# Patient Record
Sex: Female | Born: 1940
Health system: Southern US, Community
[De-identification: ages and names within clinical notes are randomized; demographics above are authoritative.]

## PROBLEM LIST (undated history)

## (undated) DIAGNOSIS — E7800 Pure hypercholesterolemia, unspecified: Secondary | ICD-10-CM

## (undated) DIAGNOSIS — I639 Cerebral infarction, unspecified: Secondary | ICD-10-CM

## (undated) DIAGNOSIS — W5503XA Scratched by cat, initial encounter: Secondary | ICD-10-CM

## (undated) DIAGNOSIS — I10 Essential (primary) hypertension: Secondary | ICD-10-CM

## (undated) DIAGNOSIS — Z87442 Personal history of urinary calculi: Secondary | ICD-10-CM

## (undated) DIAGNOSIS — Z8739 Personal history of other diseases of the musculoskeletal system and connective tissue: Secondary | ICD-10-CM

## (undated) DIAGNOSIS — K649 Unspecified hemorrhoids: Secondary | ICD-10-CM

## (undated) DIAGNOSIS — S50811A Abrasion of right forearm, initial encounter: Secondary | ICD-10-CM

## (undated) DIAGNOSIS — H9190 Unspecified hearing loss, unspecified ear: Secondary | ICD-10-CM

## (undated) DIAGNOSIS — M549 Dorsalgia, unspecified: Secondary | ICD-10-CM

## (undated) DIAGNOSIS — R0600 Dyspnea, unspecified: Secondary | ICD-10-CM

## (undated) DIAGNOSIS — G43909 Migraine, unspecified, not intractable, without status migrainosus: Secondary | ICD-10-CM

## (undated) DIAGNOSIS — M1990 Unspecified osteoarthritis, unspecified site: Secondary | ICD-10-CM

## (undated) HISTORY — DX: Unspecified hemorrhoids: K64.9

## (undated) HISTORY — PX: ANTERIOR AND POSTERIOR REPAIR: SHX1172

## (undated) HISTORY — PX: TONSILLECTOMY: SUR1361

## (undated) HISTORY — DX: Migraine, unspecified, not intractable, without status migrainosus: G43.909

## (undated) HISTORY — PX: CARDIAC CATHETERIZATION: SHX172

## (undated) HISTORY — PX: BREAST SURGERY: SHX581

## (undated) HISTORY — PX: CHOLECYSTECTOMY: SHX55

## (undated) HISTORY — DX: Scratched by cat, initial encounter: W55.03XA

## (undated) HISTORY — PX: APPENDECTOMY: SHX54

## (undated) HISTORY — DX: Abrasion of right forearm, initial encounter: S50.811A

## (undated) HISTORY — PX: ABDOMINAL HYSTERECTOMY: SHX81

## (undated) HISTORY — PX: BILATERAL SALPINGOOPHORECTOMY: SHX1223

---

## 2000-11-17 ENCOUNTER — Encounter: Payer: Self-pay | Admitting: Family Medicine

## 2000-11-17 ENCOUNTER — Ambulatory Visit (HOSPITAL_COMMUNITY): Admission: RE | Admit: 2000-11-17 | Discharge: 2000-11-17 | Payer: Self-pay | Admitting: Family Medicine

## 2001-06-24 HISTORY — PX: OTHER SURGICAL HISTORY: SHX169

## 2001-10-27 ENCOUNTER — Encounter: Payer: Self-pay | Admitting: Internal Medicine

## 2001-10-27 ENCOUNTER — Ambulatory Visit (HOSPITAL_COMMUNITY): Admission: RE | Admit: 2001-10-27 | Discharge: 2001-10-27 | Payer: Self-pay | Admitting: Family Medicine

## 2002-01-28 ENCOUNTER — Ambulatory Visit (HOSPITAL_COMMUNITY): Admission: RE | Admit: 2002-01-28 | Discharge: 2002-01-28 | Payer: Self-pay | Admitting: Internal Medicine

## 2002-04-15 ENCOUNTER — Encounter: Payer: Self-pay | Admitting: Internal Medicine

## 2002-04-15 ENCOUNTER — Observation Stay (HOSPITAL_COMMUNITY): Admission: RE | Admit: 2002-04-15 | Discharge: 2002-04-16 | Payer: Self-pay | Admitting: Internal Medicine

## 2002-10-13 ENCOUNTER — Encounter: Payer: Self-pay | Admitting: Family Medicine

## 2002-10-13 ENCOUNTER — Ambulatory Visit (HOSPITAL_COMMUNITY): Admission: RE | Admit: 2002-10-13 | Discharge: 2002-10-13 | Payer: Self-pay | Admitting: *Deleted

## 2003-01-24 ENCOUNTER — Encounter (HOSPITAL_COMMUNITY)
Admission: RE | Admit: 2003-01-24 | Discharge: 2003-02-23 | Payer: Self-pay | Admitting: Physical Medicine and Rehabilitation

## 2003-11-25 ENCOUNTER — Ambulatory Visit (HOSPITAL_COMMUNITY): Admission: RE | Admit: 2003-11-25 | Discharge: 2003-11-25 | Payer: Self-pay | Admitting: Family Medicine

## 2004-05-01 ENCOUNTER — Ambulatory Visit: Payer: Self-pay | Admitting: Internal Medicine

## 2005-03-11 ENCOUNTER — Ambulatory Visit: Payer: Self-pay | Admitting: Orthopedic Surgery

## 2005-03-11 ENCOUNTER — Ambulatory Visit (HOSPITAL_COMMUNITY): Admission: RE | Admit: 2005-03-11 | Discharge: 2005-03-11 | Payer: Self-pay | Admitting: Family Medicine

## 2005-06-25 ENCOUNTER — Ambulatory Visit (HOSPITAL_COMMUNITY): Admission: RE | Admit: 2005-06-25 | Discharge: 2005-06-25 | Payer: Self-pay | Admitting: Family Medicine

## 2005-06-28 ENCOUNTER — Ambulatory Visit (HOSPITAL_COMMUNITY): Admission: RE | Admit: 2005-06-28 | Discharge: 2005-06-28 | Payer: Self-pay | Admitting: Family Medicine

## 2005-07-17 ENCOUNTER — Ambulatory Visit (HOSPITAL_COMMUNITY): Admission: RE | Admit: 2005-07-17 | Discharge: 2005-07-17 | Payer: Self-pay | Admitting: Family Medicine

## 2006-11-12 ENCOUNTER — Ambulatory Visit (HOSPITAL_COMMUNITY): Admission: RE | Admit: 2006-11-12 | Discharge: 2006-11-12 | Payer: Self-pay | Admitting: Orthopedic Surgery

## 2007-05-06 ENCOUNTER — Other Ambulatory Visit: Admission: RE | Admit: 2007-05-06 | Discharge: 2007-05-06 | Payer: Self-pay | Admitting: Obstetrics and Gynecology

## 2007-05-13 ENCOUNTER — Ambulatory Visit (HOSPITAL_COMMUNITY): Admission: RE | Admit: 2007-05-13 | Discharge: 2007-05-13 | Payer: Self-pay | Admitting: Obstetrics & Gynecology

## 2007-11-23 ENCOUNTER — Ambulatory Visit (HOSPITAL_COMMUNITY): Admission: RE | Admit: 2007-11-23 | Discharge: 2007-11-23 | Payer: Self-pay | Admitting: Obstetrics and Gynecology

## 2008-05-25 ENCOUNTER — Encounter
Admission: RE | Admit: 2008-05-25 | Discharge: 2008-05-25 | Payer: Self-pay | Admitting: Physical Medicine and Rehabilitation

## 2008-05-29 ENCOUNTER — Encounter: Admission: RE | Admit: 2008-05-29 | Discharge: 2008-05-29 | Payer: Self-pay | Admitting: Family Medicine

## 2008-07-11 ENCOUNTER — Encounter
Admission: RE | Admit: 2008-07-11 | Discharge: 2008-07-11 | Payer: Self-pay | Admitting: Physical Medicine and Rehabilitation

## 2008-08-11 ENCOUNTER — Encounter
Admission: RE | Admit: 2008-08-11 | Discharge: 2008-08-11 | Payer: Self-pay | Admitting: Physical Medicine and Rehabilitation

## 2008-08-11 ENCOUNTER — Encounter: Admission: RE | Admit: 2008-08-11 | Discharge: 2008-08-11 | Payer: Self-pay | Admitting: Radiology

## 2008-10-17 ENCOUNTER — Other Ambulatory Visit: Admission: RE | Admit: 2008-10-17 | Discharge: 2008-10-17 | Payer: Self-pay | Admitting: Obstetrics and Gynecology

## 2008-11-08 ENCOUNTER — Ambulatory Visit (HOSPITAL_COMMUNITY): Admission: RE | Admit: 2008-11-08 | Discharge: 2008-11-08 | Payer: Self-pay | Admitting: Urology

## 2008-11-23 ENCOUNTER — Ambulatory Visit (HOSPITAL_COMMUNITY): Admission: RE | Admit: 2008-11-23 | Discharge: 2008-11-23 | Payer: Self-pay | Admitting: Obstetrics and Gynecology

## 2008-11-30 ENCOUNTER — Ambulatory Visit (HOSPITAL_COMMUNITY): Admission: RE | Admit: 2008-11-30 | Discharge: 2008-11-30 | Payer: Self-pay | Admitting: Urology

## 2009-09-07 ENCOUNTER — Inpatient Hospital Stay (HOSPITAL_COMMUNITY): Admission: RE | Admit: 2009-09-07 | Discharge: 2009-09-08 | Payer: Self-pay | Admitting: Obstetrics and Gynecology

## 2009-09-07 ENCOUNTER — Encounter: Payer: Self-pay | Admitting: Obstetrics and Gynecology

## 2009-10-09 ENCOUNTER — Ambulatory Visit (HOSPITAL_COMMUNITY): Admission: RE | Admit: 2009-10-09 | Discharge: 2009-10-09 | Payer: Self-pay | Admitting: Family Medicine

## 2009-11-29 ENCOUNTER — Ambulatory Visit (HOSPITAL_COMMUNITY): Admission: RE | Admit: 2009-11-29 | Discharge: 2009-11-29 | Payer: Self-pay | Admitting: Obstetrics and Gynecology

## 2010-06-28 ENCOUNTER — Ambulatory Visit (HOSPITAL_COMMUNITY)
Admission: RE | Admit: 2010-06-28 | Discharge: 2010-06-28 | Payer: Self-pay | Source: Home / Self Care | Attending: Physical Medicine and Rehabilitation | Admitting: Physical Medicine and Rehabilitation

## 2010-09-14 LAB — CBC
HCT: 40.1 % (ref 36.0–46.0)
Hemoglobin: 12.8 g/dL (ref 12.0–15.0)
Hemoglobin: 13.8 g/dL (ref 12.0–15.0)
MCHC: 34.3 g/dL (ref 30.0–36.0)
MCV: 97.8 fL (ref 78.0–100.0)
MCV: 98.4 fL (ref 78.0–100.0)
RBC: 3.79 MIL/uL — ABNORMAL LOW (ref 3.87–5.11)
RDW: 13 % (ref 11.5–15.5)
RDW: 13.1 % (ref 11.5–15.5)

## 2010-09-14 LAB — COMPREHENSIVE METABOLIC PANEL
AST: 54 U/L — ABNORMAL HIGH (ref 0–37)
Albumin: 4.1 g/dL (ref 3.5–5.2)
Calcium: 9 mg/dL (ref 8.4–10.5)
Chloride: 102 mEq/L (ref 96–112)
GFR calc non Af Amer: >60 mL/min (ref >60)
Total Protein: 6.9 g/dL (ref 6.0–8.3)

## 2010-09-14 LAB — BASIC METABOLIC PANEL
BUN: 7 mg/dL (ref 6–23)
CO2: 30 mEq/L (ref 19–32)
Calcium: 8.6 mg/dL (ref 8.4–10.5)
Chloride: 101 mEq/L (ref 96–112)
GFR calc Af Amer: >60 mL/min (ref >60)
Potassium: 3.9 mEq/L (ref 3.5–5.1)

## 2010-09-14 LAB — DIFFERENTIAL
Eosinophils Absolute: 0.1 10*3/uL (ref 0.0–0.7)
Eosinophils Relative: 0 % (ref 0–5)
Lymphocytes Relative: 15 % (ref 12–46)
Lymphs Abs: 2 10*3/uL (ref 0.7–4.0)
Monocytes Absolute: 1.4 10*3/uL — ABNORMAL HIGH (ref 0.1–1.0)
Monocytes Relative: 10 % (ref 3–12)

## 2010-10-01 LAB — BASIC METABOLIC PANEL
CO2: 30 mEq/L (ref 19–32)
Calcium: 9.2 mg/dL (ref 8.4–10.5)
Chloride: 102 mEq/L (ref 96–112)
Creatinine, Ser: 0.52 mg/dL (ref 0.4–1.2)
GFR calc Af Amer: >60 mL/min (ref >60)
Glucose, Bld: 85 mg/dL (ref 70–99)

## 2010-10-01 LAB — CBC
MCHC: 35.3 g/dL (ref 30.0–36.0)
MCV: 97 fL (ref 78.0–100.0)
RBC: 3.64 MIL/uL — ABNORMAL LOW (ref 3.87–5.11)
RDW: 13.8 % (ref 11.5–15.5)

## 2010-10-19 ENCOUNTER — Other Ambulatory Visit: Payer: Self-pay | Admitting: Ophthalmology

## 2010-10-19 ENCOUNTER — Encounter (HOSPITAL_COMMUNITY): Payer: Medicare Other

## 2010-10-19 LAB — BASIC METABOLIC PANEL
BUN: 13 mg/dL (ref 6–23)
Chloride: 103 mEq/L (ref 96–112)
Creatinine, Ser: 0.74 mg/dL (ref 0.4–1.2)
GFR calc non Af Amer: >60 mL/min (ref >60)
Glucose, Bld: 108 mg/dL — ABNORMAL HIGH (ref 70–99)
Potassium: 4 mEq/L (ref 3.5–5.1)

## 2010-10-19 LAB — HEMOGLOBIN AND HEMATOCRIT, BLOOD
HCT: 39.6 % (ref 36.0–46.0)
Hemoglobin: 13 g/dL (ref 12.0–15.0)

## 2010-10-25 ENCOUNTER — Ambulatory Visit (HOSPITAL_COMMUNITY)
Admission: RE | Admit: 2010-10-25 | Discharge: 2010-10-25 | Disposition: A | Discharge status: Home / Self Care | Payer: Medicare Other | Source: Ambulatory Visit | Attending: Ophthalmology | Admitting: Ophthalmology

## 2010-10-25 DIAGNOSIS — Z79899 Other long term (current) drug therapy: Secondary | ICD-10-CM | POA: Insufficient documentation

## 2010-10-25 DIAGNOSIS — Z01810 Encounter for preprocedural cardiovascular examination: Secondary | ICD-10-CM | POA: Insufficient documentation

## 2010-10-25 DIAGNOSIS — I10 Essential (primary) hypertension: Secondary | ICD-10-CM | POA: Insufficient documentation

## 2010-10-25 DIAGNOSIS — Z7982 Long term (current) use of aspirin: Secondary | ICD-10-CM | POA: Insufficient documentation

## 2010-10-25 DIAGNOSIS — H2510 Age-related nuclear cataract, unspecified eye: Secondary | ICD-10-CM | POA: Insufficient documentation

## 2010-10-25 DIAGNOSIS — Z01812 Encounter for preprocedural laboratory examination: Secondary | ICD-10-CM | POA: Insufficient documentation

## 2010-11-01 ENCOUNTER — Encounter (HOSPITAL_COMMUNITY): Payer: Medicare Other

## 2010-11-05 ENCOUNTER — Ambulatory Visit (HOSPITAL_COMMUNITY)
Admission: RE | Admit: 2010-11-05 | Discharge: 2010-11-05 | Disposition: A | Discharge status: Home / Self Care | Payer: Medicare Other | Source: Ambulatory Visit | Attending: Ophthalmology | Admitting: Ophthalmology

## 2010-11-05 DIAGNOSIS — I10 Essential (primary) hypertension: Secondary | ICD-10-CM | POA: Insufficient documentation

## 2010-11-05 DIAGNOSIS — Z79899 Other long term (current) drug therapy: Secondary | ICD-10-CM | POA: Insufficient documentation

## 2010-11-05 DIAGNOSIS — Z7982 Long term (current) use of aspirin: Secondary | ICD-10-CM | POA: Insufficient documentation

## 2010-11-05 DIAGNOSIS — H268 Other specified cataract: Secondary | ICD-10-CM | POA: Insufficient documentation

## 2010-11-05 DIAGNOSIS — H2510 Age-related nuclear cataract, unspecified eye: Secondary | ICD-10-CM | POA: Insufficient documentation

## 2010-11-06 NOTE — Op Note (Signed)
Connie Wood, DANISH         ACCOUNT NO.:  [id number]   MEDICAL RECORD NO.:  [id number]          PATIENT TYPE:  AMB   LOCATION:  DAY                           FACILITY:  [facility]   PHYSICIAN:  Dennie Maizes, M.D.   DATE OF BIRTH:  June 01, 1941   DATE OF PROCEDURE:  11/30/2008  DATE OF DISCHARGE:                               OPERATIVE REPORT   PREOPERATIVE DIAGNOSES:  Hematuria, voiding difficulty.   POSTOPERATIVE DIAGNOSES:  Hematuria, voiding difficulty, urethral  stenosis.   OPERATIVE PROCEDURES:  Cystoscopy and urethral dilation.   ANESTHESIA:  General.   SURGEON:  Dennie Maizes, MD   COMPLICATIONS:  None.   SPECIMEN:  None.   DRAINS:  None.   INDICATIONS FOR THE PROCEDURE:  This 70 year old female was referred to  me for intermittent gross hematuria.  Evaluation revealed bilateral  renal calculi without obstruction.  Her urine cytology was negative.  She also had voiding difficulty.  She was taken to the operating room  today for cystoscopy with possible urethral dilation.   DESCRIPTION OF THE PROCEDURE:  General anesthesia was induced and the  patient was placed on the OR table in the dorsal lithotomy position.  The lower abdomen and genitalia were prepped and draped in a sterile  fashion.  Cystoscopy was done with a 17-French scope.  Appearance of  bladder was normal.  The trigone ureteral orifice and bladder mucosa was  unremarkable.  There was no evidence of any carcinoma in situ, foreign  body, or  bladder tumor.  Bladder capacity was found to be adequate.  The  cystoscope was removed.  Urethral caliber to 18-French and dilation  entered 30-French with straight metal sounds.  The patient tolerated the  procedure well.  She was transferred to the PACU in a satisfactory  condition.      Dennie Maizes, M.D.  Electronically Signed     SK/MEDQ  D:  11/30/2008  T:  11/30/2008  Job:  [id number]   cc:   Marylou Mccoy   Angus G. Renard Matter, MD  Fax:  [phone]

## 2010-11-06 NOTE — H&P (Signed)
NAME:  Connie Wood, Connie Wood NO.:  [id number]   MEDICAL RECORD NO.:  [id number]         PATIENT TYPE:  PAMB   LOCATION:  DAY                           FACILITY:  [facility]   PHYSICIAN:  Dennie Maizes, M.D.        DATE OF BIRTH:   DATE OF ADMISSION:  DATE OF DISCHARGE:  LH                              HISTORY & PHYSICAL   CHIEF COMPLAINT:  Intermittent mild hematuria.   HISTORY OF PRESENT ILLNESS:  This 70 year old female was referred to me  by Miss Cyril Mourning from [facility] for  evaluation of  intermittent gross hematuria.  The patient had intermittent mild  hematuria for 2 days about 2 months ago.  She denied having any  abdominal or flank pain rales with the hematuria.  She denied having  dysuria.  Urine culture and sensitivity revealed no growth.  She had  persistent microhematuria.  She was referred to me.   The patient complains of voiding difficulty as well as intermittent low  back pain.  She has urinary frequency x4-5 and nocturia x2.  No past  history of urolithiasis or hematuria.  She had been treated for urinary  tract infection several years ago.   PAST MEDICAL HISTORY:  1. Hypertension.  2. Rheumatic fever.  3. Migraine headaches.  4. Chronic back pain.  5. Degenerative joint disease.  6,  elevated cholesterol.  1. History of common bile duct stone.  2. Status post tonsillectomy.  3. Dilatation and curettage.  4. Tubal ligation.  5. Status post cholecystectomy.  6. Foot surgery.   MEDICATIONS:  1. Baby aspirin 81 mg p.o. daily.  2. Actonel 40 mg p.o. daily.  3. Hydrochlorothiazide 12.5 mg p.o. daily.  4. Effexor XR at 150 mg daily.  5. Clorazepate 7.5 mg p.o. daily.  6. Nexium 40 mg p.o. daily.  7. OxyContin 5 mg p.o. b.i.d.  8. Hydrocodone with APAP 10/500 one p.o. p.r.n. for pain.  9. Lipitor 40 mg  p.o. daily.   ALLERGIES:  None.   FAMILY HISTORY:  Heart disease, kidney stones and liver cancer.   PHYSICAL EXAMINATION:   VITAL SIGNS:  Height 5 feet 4 inches, weight 152  pounds.  HEENT:  Normal.  LUNGS:  Clear to auscultation.  HEART:  Regular rate and rhythm.  No murmurs.  ABDOMEN: No palpable flank mass or CVA tenderness.  Bladder not  palpable.  PELVIC:  Examination not done.   LABORATORY DATA:  Cytology revealed no evidence of malignant of  dysplastic cells.  CT scan of abdomen and pelvis without and with  contrast at [facility] revealed multiple bilateral renal  calculim, two stones in the right kidney  measuring 7 x 3 and 7 x 4 mm  in size, 2.4 mm size stone in the left kidney, no evidence of  obstruction.  Small  small right renal cyst 10 mm size has been noted.  CT of pelvis was unremarkable.   IMPRESSION:  Hematuria, voiding difficulty, bilateral renal calculi.   PLAN:  Cystoscopy and possible urethral dilation under anesthesia in  short-stay center.  I discussed with the patient and  her family  regarding diagnosis, operative details of the treatments, outcome,  possible risks and complications and they are agreed for the procedure  to be done.      Dennie Maizes, M.D.  Electronically Signed     SK/MEDQ  D:  11/29/2008  T:  11/30/2008  Job:  [id number]   cc:   Cyril Mourning, [facility]   Angus G. Renard Matter, MD  Fax: [phone]   [facility]  Fax: [phone]

## 2010-11-09 NOTE — Op Note (Signed)
NAME:  Connie Wood, Connie Wood                   ACCOUNT NO.:  [id number]   MEDICAL RECORD NO.:  [id number]                   PATIENT TYPE:  AMB   LOCATION:  DAY                                  FACILITY:  [facility]   PHYSICIAN:  R. Roetta Sessions, M.D.              DATE OF BIRTH:  1940-10-30   DATE OF PROCEDURE:  DATE OF DISCHARGE:                                 OPERATIVE REPORT   PROCEDURE:  ERCP with sphincterotomy and stone extraction.   INDICATIONS FOR PROCEDURE:  The patient is a 70 year old lady with  intermittent upper abdominal pain for over 1 year duration.  She had been  extensively evaluated.  More recently, she underwent endoscopic ultrasound  at [facility].  Her biliary tree was noted to be dilated, and she  had a 6 mm filling defect in the duct, consistent with a stone.  She now  comes for ERCP with sphincterotomy and stone extraction. This approach has  been discussed with the patient, and the potential risks, benefits, and  alternatives have been reviewed and questions answered.  Please see my  dictated H & P for more information.  We talked about the risks of the  procedure, including bleeding, perforation, and pancreatitis.  ASA class II.   PROCEDURE IN DETAIL:  Under the fluoroscopy unit, the patient was placed in  the semiprone position.  The patient received ampicillin 2 gm IV and  gentamicin 120 mg IV prior to the procedure.   Conscious sedation--Demerol 125 mg IV and Versed 5 mg IV in divided doses.  Glucagon 1.25 mg IV in divided doses to arrest small bowel motility.   INSTRUMENT:  Olympus therapeutic size duodenoscope.   FINDINGS:  Examination of the distal esophagus and stomach revealed a 2 cm  elliptical prepyloric ulcer with bleeding stigmata.  Please see photos.  The  pylorus was patent and easily traversed.  The duodenum was inspected to the  second portion.  The ampulla was hidden under some floppy, redundant folds.  The scope was pulled back  to assure position 55 cm from the incisors.  A  scout film was taken using a sphincterotome.  The ampulla was impacted.  I  had some difficulty approaching the ampulla because redundant folds above  and below were getting in the way.  The head and body segment of the  pancreatic duct was injected.  These portions of the duct appeared normal.  A more tangential approach was taken for common bile duct cannulation.  We  were eventually able to cannulate the duct.  A cholangiogram was obtained.  The biliary tree was diffusely dilated and evidence of prior  cholecystectomy.   The common bile duct measured approximately 8-9 mm.  I was unable to  identify a filling defect on the real time fluoroscopy images.  Dr. Renato Gails was  present as well.  I placed a safety wire deep in the biliary tree and pulled  the sphincterotome  back across the ampullary orifice at the 12 o'clock  position.  I cut a nearly 1 cm sphincterotomy.  As this was being done, some  small pebbles were recovered through the ampullary orifice.  Subsequently,  I removed the sphincterotome, leaving the guide wire in place and drilled a  graduated 8.5 mm balloon deep in the biliary tree and inflated it to 8.5 and  finally 10 cm and performed a balloon occlusion cholangiogram.  This yielded  additional small fragments and a 5-6 mm yellow, cylindrical stone.  Additional injections revealed no further evidence of filling defects, and  there appeared to be good drainage of contrast through the ampullary  orifice, although there was still quite a bit of contrast in the upstream  biliary tree.   The patient tolerated the procedure well.  Delayed films are pending.   IMPRESSION:  1. Normal-appearing ampulla. Previously dilated biliary tree, status post     cholecystectomy.  2. Choledocholithiasis as described above, status post sphincterotomy with     balloon dredging of the bile duct.   Of note, this lady's hemoglobin was 9.2 the day  prior to the procedure.  She  was 11 back in March through [facility].  She also has an MCV of 77.  This lady has had a colonoscopy previously without any significant findings.   She has a prepyloric ulcer.   She told me prior to the procedure that she has been taking aspirin for  headaches.   This is also evidenced by her prolonged bleeding time of 11 minutes earlier  today.   RECOMMENDATIONS:  1. We are going to have this patient stay overnight, and we will check the     delayed films and repeat the LFTs and amylase tomorrow morning.     Hopefully, removal of the common duct stones will relieve this patient of     her abdominal pain.  2. We will have additional discussions with the patient about staying away     from aspirin.                                               Jonathon Bellows, M.D.    RMR/MEDQ  D:  04/15/2002  T:  04/15/2002  Job:  [id number]   cc:   Angus G. Renard Matter, M.D.   Truitt Merle

## 2010-11-09 NOTE — Op Note (Signed)
NAME:  Connie Wood, Connie Wood                   ACCOUNT NO.:  [id number]   MEDICAL RECORD NO.:  [id number]                   PATIENT TYPE:  AMB   LOCATION:  DAY                                  FACILITY:  [facility]   PHYSICIAN:  Gerrit Friends. Rourk, M.D.               DATE OF BIRTH:  1940/08/31   DATE OF PROCEDURE:  01/28/2002  DATE OF DISCHARGE:                                 OPERATIVE REPORT   PROCEDURE:  Screening colonoscopy.   INDICATIONS FOR PROCEDURE:  The patient is a 70 year old lady with chronic  abdominal pain for which she had been evaluated extensively. She has never  had a lower GI tract screen. She now comes for screening colonoscopy. This  approach has been discussed with Ms. Polito. The potential risks,  benefits, and alternatives have been reviewed, questions answered. She is  agreeable. Please see my dictated H&P. If she is low risk for conscious  sedation will use Versed and Demerol.   MONITORING:  O2 saturation, blood pressure, pulse and respirations were  monitored throughout the entire procedure.   CONSCIOUS SEDATION:  Versed 4 mg IV, Demerol 75 mg IV in divided doses.   INSTRUMENT:  Olympus video chip adult.   COLONOSCOPIC FINDINGS:  Digital rectal exam revealed no abnormalities.   ENDOSCOPIC FINDINGS:  The prep was good.   RECTUM:  Examination of the rectal mucosa including retroflexed view of the  anal verge revealed no abnormalities.   COLON:  The colonic mucosa was surveyed from the rectosigmoid junction  through the left transverse right colon to the area of the appendiceal  orifice, ileocecal valve and cecum. The patient was noted to have a few  scattered right sided diverticula. The remainder of the colonic mucosa all  the way to the cecum appeared normal.  The cecum, ileocecal valve and  appendiceal orifice were photographed for the record. From this level, the  scope was slowly withdrawn and all previously mentioned mucosal surfaces  were again  seen and no other abnormalities were observed. The patient  tolerated the procedure well and was reacted in endoscopy.   IMPRESSION:  1. Normal rectum.  2. Scattered right sided diverticula. The remainder of the colonic mucosa     appeared normal.  3. No endoscopic explanation to explain the patient's chronic abdominal     pain. We mutually agreed upon pursuing a second opinion at [facility].    RECOMMENDATIONS:  Diverticulosis literature provided to Ms. Turney.  Will make plans for her to be seen in consultation in a GI Clinic at [facility] in the near future.                                                Gerrit Friends. Rourk, M.D.  RMR/MEDQ  D:  01/28/2002  T:  02/01/2002  Job:  [id number]   cc:   Angus G. Renard Matter, M.D.

## 2010-11-16 ENCOUNTER — Other Ambulatory Visit: Payer: Self-pay | Admitting: Obstetrics & Gynecology

## 2010-11-16 DIAGNOSIS — Z139 Encounter for screening, unspecified: Secondary | ICD-10-CM

## 2010-12-03 NOTE — Op Note (Signed)
  Connie Wood, Connie Wood         ACCOUNT NO.:  [id number]  MEDICAL RECORD NO.:  [id number]           PATIENT TYPE:  O  LOCATION:  DAYP                          FACILITY:  [facility]  PHYSICIAN:  Susanne Greenhouse, MD       DATE OF BIRTH:  1940-08-06  DATE OF PROCEDURE:  11/05/2010 DATE OF DISCHARGE:                              OPERATIVE REPORT   PREOPERATIVE DIAGNOSES: 1. Nuclear cataract, right eye; diagnosis code 366.16. 2. 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  POSTOPERATIVE DIAGNOSES: 1. Nuclear cataract, right eye; diagnosis code 366.16. 2. 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  OPERATION PERFORMED:  Phacoemulsification with posterior chamber intraocular lens implantation, right eye.  SURGEON:  Bonne Dolores. Taevin Mcferran, MD  ANESTHESIA:  General endotracheal anesthesia.  OPERATIVE SUMMARY:  In the preoperative area, dilating drops were placed into the right eye.  The patient was then brought into the operating room where she was placed under general anesthesia.  The eye was then prepped and draped.  Beginning with a 75 blade, a paracentesis port was made at the surgeon's 2 o'clock position.  The anterior chamber was then filled with a 1% nonpreserved lidocaine solution with epinephrine.  This was followed by Viscoat to deepen the chamber.  A small fornix-based peritomy was performed superiorly.  Next, a single iris hook was placed through the limbus superiorly.  A 2.4-mm keratome blade was then used to make a clear corneal incision over the iris hook.  A bent cystotome needle and Utrata forceps were used to create a continuous tear capsulotomy.  Hydrodissection was performed using balanced salt solution on a fine cannula.  The lens nucleus was then removed using phacoemulsification in a quadrant cracking technique.  The cortical material was then removed with irrigation and aspiration.  The capsular bag and anterior chamber were refilled with Provisc.  The  wound was widened to approximately 3 mm and a posterior chamber intraocular lens was placed into the capsular bag without difficulty using an [employer] lens injecting system.  A single 10-0 nylon suture was then used to close the incision as well as stromal hydration.  The Provisc was removed from the anterior chamber and capsular bag with irrigation and aspiration.  At this point, the wounds were tested for leak, which were negative.  The anterior chamber remained deep and stable.  The patient tolerated the procedure well.  There were no operative complications, and she awoke from general anesthesia without problem.  Prosthetic device used is a Lenstec posterior chamber lens model Softec HD power of 23.0, serial number is [id number].          ______________________________ Susanne Greenhouse, MD     KEH/MEDQ  D:  11/05/2010  T:  11/05/2010  Job:  [id number]  Electronically Signed by Gemma Payor MD on 12/03/2010 12:19:42 PM

## 2010-12-03 NOTE — Op Note (Signed)
  NAMEJAHZARA, Connie Wood         ACCOUNT NO.:  [id number]  MEDICAL RECORD NO.:  [id number]           PATIENT TYPE:  O  LOCATION:  DAYP                          FACILITY:  [facility]  PHYSICIAN:  Susanne Greenhouse, MD       DATE OF BIRTH:  Jun 22, 1941  DATE OF PROCEDURE:  10/25/2010 DATE OF DISCHARGE:                              OPERATIVE REPORT   PREOPERATIVE DIAGNOSIS:  Nuclear cataract, left eye.  POSTOPERATIVE DIAGNOSIS:  Nuclear cataract, left eye.  DIAGNOSIS CODE:  366.16  OPERATION PERFORMED:  Phacoemulsification with posterior chamber intraocular lens implantation, left eye.  SURGEON:  Bonne Dolores. Montgomery Rothlisberger, MD  ANESTHESIA:  General endotracheal anesthesia.  OPERATIVE SUMMARY:  In the preoperative area, dilating drops were placed into the left eye.  The patient was then brought into the operating room where he was placed under general anesthesia.  The eye was then prepped and draped.  Beginning with a 75 blade, a paracentesis port was made at the surgeon's 2 o'clock position.  The anterior chamber was then filled with a 1% nonpreserved lidocaine solution with epinephrine.  This was followed by Viscoat to deepen the chamber.  A small fornix-based peritomy was performed superiorly.  Next, a single iris hook was placed through the limbus superiorly.  A 2.4-mm keratome blade was then used to make a clear corneal incision over the iris hook.  A bent cystotome needle and Utrata forceps were used to create a continuous tear capsulotomy.  Hydrodissection was performed using balanced salt solution on a fine cannula.  The lens nucleus was then removed using phacoemulsification in a quadrant cracking technique.  The cortical material was then removed with irrigation and aspiration.  The capsular bag and anterior chamber were refilled with Provisc.  The wound was widened to approximately 3 mm and a posterior chamber intraocular lens was placed into the capsular bag without difficulty using an  [employer] lens injecting system.  A single 10-0 nylon suture was then used to close the incision as well as stromal hydration.  The Provisc was removed from the anterior chamber and capsular bag with irrigation and aspiration.  At this point, the wounds were tested for leak, which were negative.  The anterior chamber remained deep and stable.  The patient tolerated the procedure well.  There were no operative complications, and he awoke from general anesthesia without problem.  Prosthetic device used is a Lenstec posterior chamber lens model Softec HD power of 21.5, serial number is [id number].          ______________________________ Susanne Greenhouse, MD     KEH/MEDQ  D:  10/26/2010  T:  10/26/2010  Job:  [id number]  Electronically Signed by Gemma Payor MD on 12/03/2010 12:19:38 PM

## 2010-12-04 ENCOUNTER — Ambulatory Visit (HOSPITAL_COMMUNITY)
Admission: RE | Admit: 2010-12-04 | Discharge: 2010-12-04 | Disposition: A | Discharge status: Home / Self Care | Payer: Medicare Other | Source: Ambulatory Visit | Attending: Obstetrics & Gynecology | Admitting: Obstetrics & Gynecology

## 2010-12-04 DIAGNOSIS — Z139 Encounter for screening, unspecified: Secondary | ICD-10-CM

## 2010-12-04 DIAGNOSIS — Z1231 Encounter for screening mammogram for malignant neoplasm of breast: Secondary | ICD-10-CM | POA: Insufficient documentation

## 2010-12-20 ENCOUNTER — Inpatient Hospital Stay (HOSPITAL_COMMUNITY)
Admission: EM | Admit: 2010-12-20 | Discharge: 2010-12-22 | DRG: 066 | Disposition: A | Discharge status: Home / Self Care | Payer: Medicare Other | Attending: Family Medicine | Admitting: Family Medicine

## 2010-12-20 DIAGNOSIS — F329 Major depressive disorder, single episode, unspecified: Secondary | ICD-10-CM | POA: Diagnosis present

## 2010-12-20 DIAGNOSIS — IMO0002 Reserved for concepts with insufficient information to code with codable children: Secondary | ICD-10-CM | POA: Diagnosis present

## 2010-12-20 DIAGNOSIS — F3289 Other specified depressive episodes: Secondary | ICD-10-CM | POA: Diagnosis present

## 2010-12-20 DIAGNOSIS — I10 Essential (primary) hypertension: Secondary | ICD-10-CM | POA: Diagnosis present

## 2010-12-20 DIAGNOSIS — E785 Hyperlipidemia, unspecified: Secondary | ICD-10-CM | POA: Diagnosis present

## 2010-12-20 DIAGNOSIS — I6350 Cerebral infarction due to unspecified occlusion or stenosis of unspecified cerebral artery: Principal | ICD-10-CM | POA: Diagnosis present

## 2010-12-21 ENCOUNTER — Emergency Department (HOSPITAL_COMMUNITY): Payer: Medicare Other

## 2010-12-21 ENCOUNTER — Inpatient Hospital Stay (HOSPITAL_COMMUNITY): Payer: Medicare Other

## 2010-12-21 DIAGNOSIS — I369 Nonrheumatic tricuspid valve disorder, unspecified: Secondary | ICD-10-CM

## 2010-12-21 LAB — DIFFERENTIAL
Basophils Relative: 1 % (ref 0–1)
Eosinophils Absolute: 0.6 10*3/uL (ref 0.0–0.7)
Eosinophils Relative: 5 % (ref 0–5)
Lymphs Abs: 3.2 10*3/uL (ref 0.7–4.0)
Monocytes Relative: 9 % (ref 3–12)

## 2010-12-21 LAB — BASIC METABOLIC PANEL
Calcium: 10 mg/dL (ref 8.4–10.5)
Creatinine, Ser: 0.95 mg/dL (ref 0.50–1.10)
GFR calc Af Amer: >60 mL/min (ref >60)
GFR calc non Af Amer: 58 mL/min — ABNORMAL LOW (ref >60)
Sodium: 138 mEq/L (ref 135–145)

## 2010-12-21 LAB — CBC
MCH: 32.6 pg (ref 26.0–34.0)
MCV: 98.6 fL (ref 78.0–100.0)
Platelets: 218 10*3/uL (ref 150–400)
RDW: 12.9 % (ref 11.5–15.5)

## 2010-12-27 NOTE — Discharge Summary (Signed)
Connie Wood, Connie Wood         ACCOUNT NO.:  [id number]  MEDICAL RECORD NO.:  [id number]  LOCATION:  A330                          FACILITY:  [facility]  PHYSICIAN:  Beckett Hickmon G. Renard Matter, MD   DATE OF BIRTH:  1941-05-02  DATE OF ADMISSION:  12/21/2010 DATE OF DISCHARGE:  06/30/2012LH                              DISCHARGE SUMMARY   This 70 year old white female was admitted on December 21, 2010, and discharged on December 22, 2010, 1-day hospitalization.  DIAGNOSES: 1. Cerebrovascular accident involving the left posterior frontal     region of brain. 2. Prior history of hypertension. 3. Depression. 4. Degenerative disk disease. 5. Dyslipidemia.  CONDITION:  Stable and improved at the time of her discharge.  This patient apparently late in the afternoon on day of admission developed weakness in her right arm and hand.  She was unable to lift a glass. This lasted approximately an hour.  She discussed this with her family and several hours later it reoccurred, so she presented in this fashion to the emergency department where she was evaluated.  A head CT was done which showed mild diffuse atrophy and small vessel ischemic changes similar to prior study.  There was a vague focal low attenuation change in the left posterior frontal region which could represent early signs of acute infarct.  There was no evidence of acute intracranial hemorrhage or mass lesion.  The patient was subsequently admitted with this problem.  PHYSICAL EXAMINATION ON ADMISSION:  GENERAL:  The patient is elderly, alert. VITAL SIGNS:  Blood pressure 153/72, respirations 18, pulse 66, and temp 97.6. HEENT:  Eyes, PERRLA.  TMs negative.  Oropharynx benign. NECK:  Supple.  No JVD or thyroid abnormalities.  No carotid bruits. HEART:  Regular rhythm.  No murmurs.  No cardiomegaly. LUNGS:  Clear to P and A. ABDOMEN:  No palpable organs or masses and no organomegaly. EXTREMITIES:  Full range of motion. NEUROLOGICAL:   Cranial nerves intact.  Normal speech.  The patient has no motor or sensory abnormalities.  No abnormal reflexes. SKIN:  Warm and dry.  LABORATORY DATA:  Admission CBC:  WBC 10,500 with hemoglobin 14.4, hematocrit 43.6.  BMP:  Sodium 138, potassium 4.8, chloride 98, CO2 of 32, glucose 89, BUN 15, and creatinine 0.95.  CT of the head on admission, mild diffuse atrophy and small vessel ischemic changes, similar to prior study.  There is a vague focal low attenuation change in the left posterior frontal region which could represent early signs of acute infarct.  No evidence of acute intracranial hemorrhage or mass lesion.  Bilateral carotid duplex ultrasound, bilateral carotid bifurcation, proximal ICA plaque resulting in less than 50% diameter stenosis.  The exam does not exclude plaque ulceration or embolization. A 2-D echocardiogram was completed.  Estimated ejection fraction was in the range of 60-65% with normal wall motion.  No regional wall motion abnormalities and essentially normal findings, otherwise.  HOSPITAL COURSE:  The patient at the time of her admission was placed on a regular diet.  Vital signs and neuro checks were monitored.  She was placed on nasal O2 at 2 liters per minute.  Saline lock was instituted. She was placed on 40 mg of Lovenox subcutaneously  daily and continued on Lipitor 40 mg daily, OxyContin 40 mg b.i.d., lorazepam 1 mg at bedtime, clorazepate 7.5 mg p.r.n., Nexium 40 mg daily, and aspirin 81 mg daily. She was seen in consultation by Neurology and it was his feeling that she should be placed on Plavix 75 mg daily along with aspirin and this should be continued for about 2-3 months.     Dorthie Santini G. Renard Matter, MD     AGM/MEDQ  D:  12/22/2010  T:  12/22/2010  Job:  [id number]  Electronically Signed by Butch Penny MD on 12/27/2010 06:54:57 AM

## 2010-12-27 NOTE — H&P (Signed)
  Connie Wood, Connie Wood         ACCOUNT NO.:  [id number]  MEDICAL RECORD NO.:  [id number]  LOCATION:                                 FACILITY:  PHYSICIAN:  Song Myre G. Renard Matter, MD   DATE OF BIRTH:  12-17-1940  DATE OF ADMISSION: DATE OF DISCHARGE:  LH                             HISTORY & PHYSICAL   The patient is a 70 year old, apparently late in the afternoon on day of admission, she developed weakness in her right arm and hand.  She was unable to lift a glass, this lasted approximately 1 hour.  She discussed this with her family and several hours later it reoccurred again, so she presented in this fashion to the emergency department where she was evaluated.  A head CT was done which showed mild diffuse atrophy and small-vessel ischemic changes similar to prior study.  There was a vague focal low attenuation change in the left posterior frontal region which could represent early signs of acute infarct.  There is no evidence of acute intracranial hemorrhage or mass lesion.  The patient was subsequently admitted with this problem.  PAST MEDICAL HISTORY:  The patient has a prior history of hypertension, degenerative disk disease, depression, and elevated cholesterol.  SURGICAL HISTORY:  Prior cataract surgery, cholecystectomy, and surgery on her right foot.  SOCIAL HISTORY:  The patient does not drink or use drugs, but smokes cigarettes on occasions.  ALLERGIES:  No known drug allergies.  MEDICATION LIST: 1. Lipitor 40 mg daily. 2. OxyContin 40 mg b.i.d. 3. Fioricet p.r.n. 4. Lorazepam 1 mg at bedtime. 5. Venlafaxine 150 mg daily. 6. Clorazepate dipotassium 7.5 mg p.r.n. 7. Azor 10/40 once a day. 8. Nexium 40 mg daily. 9. Aspirin 81 mg daily.  REVIEW OF SYSTEMS:  HEENT:  Negative.  CARDIOPULMONARY:  No cough or chest discomfort.  GI:  No bowel irregularity or bleeding.  GU:  No dysuria or hematuria.  PHYSICAL EXAMINATION:  GENERAL:  Elderly, alert, white  female. VITAL SIGNS:  Blood pressure 153/72, respiration 18, pulse 66, temperature 97.6. HEENT:  Eyes: PERRLA.  TM negative.  Oropharynx benign. NECK:  Supple.  No JVD or thyroid abnormality.  No carotid bruits. HEART:  Regular rhythm.  No murmurs.  No cardiomegaly. LUNGS:  Clear to P and A. ABDOMEN:  No palpable organs or masses.  No organomegaly. EXTREMITIES:  Full range of motion. NEUROLOGICAL:  Cranial nerves intact.  Normal speech.  The patient has no motor or sensory abnormalities. SKIN:  Warm and dry.  ASSESSMENT:  The patient was admitted with what appears to be cerebrovascular accident involving the left posterior frontal region of brain.  She does have a prior history of hypertension, depression, degenerative disk disease, and dyslipidemia.     Ursula Dermody G. Renard Matter, MD     AGM/MEDQ  D:  12/21/2010  T:  12/21/2010  Job:  [id number]  Electronically Signed by Butch Penny MD on 12/27/2010 06:54:51 AM

## 2010-12-27 NOTE — Group Therapy Note (Signed)
  NAMEEVADNA, DONAGHY         ACCOUNT NO.:  [id number]  MEDICAL RECORD NO.:  [id number]  LOCATION:                                 FACILITY:  PHYSICIAN:  Nandi Tonnesen G. Renard Matter, MD   DATE OF BIRTH:  [birth year]  DATE OF PROCEDURE: DATE OF DISCHARGE:                                PROGRESS NOTE   This patient presented to the emergency room yesterday with a problem of weakness in her right arm and hand.  She did by x-ray show evidence of diffuse cortical atrophy and small vessel ischemic changes and early signs of acute infarct in the left posterior frontal region of the brain.  She is more alert today and relatively asymptomatic.  OBJECTIVE:  VITAL SIGNS:  Blood pressure 153/72, respirations 18, pulse 66, temperature 97.6. LUNGS:  Clear to P and A. HEART:  Regular rhythm. ABDOMEN:  No palpable organs or masses. NEUROLOGIC:  No evidence of abnormality with exception of slight weakness in the right hand.  ASSESSMENT:  The patient was admitted with weakness in right arm and right hand.  She does have CT evidence of early signs of infarct in the left frontal area.  PLAN:  To continue current regimen.     Kazimir Hartnett G. Renard Matter, MD     AGM/MEDQ  D:  12/21/2010  T:  12/21/2010  Job:  [id number]  Electronically Signed by Butch Penny MD on 12/27/2010 06:55:06 AM

## 2010-12-27 NOTE — Discharge Summary (Signed)
  [name], [name]         ACCOUNT NO.:  [id number]  MEDICAL RECORD NO.:  [id number]  LOCATION:  A330                          FACILITY:  [facility]  PHYSICIAN:  Patricio Popwell G. Renard Matter, MD   DATE OF BIRTH:  22-Aug-1940  DATE OF ADMISSION:  12/20/2010 DATE OF DISCHARGE:  LH                              DISCHARGE SUMMARY   ADDENDUM  The patient is discharged on the following medications: 1. Plavix 75 mg daily. 2. Afrin nasal spray. 3. Aspirin 81 mg daily. 4. Azor 10/40 daily. 5. Clorazepate 7.5 mg daily. 6. Fioricet 1 every 6 hours as needed. 7. Hydroxyzine 25 mg daily. 8. Hydrocodone/APAP 10/500 every 6 hours as needed. 9. Lorazepam 1 mg daily at bedtime. 10.Lipitor 40 mg daily. 11.Nexium 40 mg daily. 12.OxyContin 1 tablet twice a day.     Darion Milewski G. Renard Matter, MD     AGM/MEDQ  D:  12/22/2010  T:  12/22/2010  Job:  [id number]  Electronically Signed by Butch Penny MD on 12/27/2010 06:55:00 AM

## 2010-12-31 NOTE — Consult Note (Signed)
Connie Wood, Connie Wood         ACCOUNT NO.:  [id number]  MEDICAL RECORD NO.:  [id number]  LOCATION:  A330                          FACILITY:  [facility]  PHYSICIAN:  Oval Moralez A. Gerilyn Pilgrim, M.D. DATE OF BIRTH:  [birth year]  DATE OF CONSULTATION:  12/21/2010 DATE OF DISCHARGE:                                CONSULTATION   REFERRING PHYSICIAN:  Angus G. McInnis, MD  REASON OF CONSULT:  Right upper extremity weakness.  HISTORY OF PRESENT ILLNESS:  This is a 70 year old white female who developed acute onset of right upper extremity weakness.  She was reading a book and felt essentially weak.  She had dropped the book, went to do other things and also continued to have weakness of the right hand, that event lasted for about 45 minutes.  She had resolution of the event, but a couple of hours she had another event that lasted for few minutes.  She decided to seek medical attention.  The initial event happened about 5 o'clock, she presented to the hospital and had a workup several hours later.  Scan showing evidence of low hyperdensity posterior left frontal area consistent and suspicious for an acute infarct.  The patient reported she has been on aspirin 81 mg previous to this.  She has had no other prior events.  PAST MEDICAL HISTORY:  Significant for hypertension, degenerative disk disease, depression, and hyperlipidemia.  PAST SURGICAL HISTORY:  Significant for cataract extraction procedure, cholecystectomy, right foot surgery.  ALLERGIES:  No known drug allergies.  SOCIAL HISTORY:  No tobacco, alcohol, or illicit drug use.  Apparently, has smokes occasionally, however.  REVIEW OF SYSTEMS:  No cardiopulmonary, GI, or GU history.  She does report that she does have degenerative disk disease and chronic back pain.  She had difficulty ambulating especially more recently she reports gait ataxia and stumbling.  She uses a cane.  No headaches reported.  No falling is reported.  PHYSICAL  EXAMINATION:  GENERAL:  Pleasant lady in no acute distress. VITAL SIGNS:  Temperature 98.7, pulse is 66, respirations 18, and blood pressure 153/72. HEENT:  Head is normocephalic and atraumatic. NECK:  Supple. ABDOMEN:  Soft. EXTREMITIES:  Without edema. BACK:  Some evidence of suggestive scoliosis. MENTATION:  She was asleep when I enter the exam room.  She did awaken to sternal rub.  She was lucid once awake and speech is normal. Language and cognition are intact.  CRANIAL NERVE EVALUATION:  Pupils are 4 mm and reactive.  Visual fields are intact.  Extraocular movements are full.  Facial muscle strength is symmetric.  Tongue is midline. Uvula midline.  Shoulder shrug normal.  Motor examination shows normal tone, bulk, and strength.  I see no pronator drift.  Coordination shows no tremors or dysmetria, no past-pointing.  Reflexes are preserved. Plantars are both downgoing.  Sensation normal to light touch and temperature.  Gait is antalgic, stumbles at times.  She does ambulate without assistive devices, although she does have a cane.  CT scan reviewed in person, it shows small-to-moderate size hypodensity involving in the cortex and subcortical area, left posterior frontal area consistent with acute infarct.  No hemorrhage or edema.  There is mild frontal atrophy bilaterally.  MEDICATIONS:  Lipitor, OxyContin, Fioricet, Lortab, venlafaxine, clorazepate, Azor, Nexium, and aspirin 81 mg daily.  ASSESSMENT:  Acute posterior left frontal infarct.  Risk factors; age, hypertension, and dyslipidemia.  RECOMMENDATIONS:  Agree with echocardiography.  Carotid that have been ordered.  She has been bumped up to full dose aspirin which is reasonable.  I want to add Plavix and recommend continuing this combination for about 2-3 months subsequent to that.  She should be only single antiplatelet agent preferably Plavix.  Continue with other risk factor modification.  Thanks this  consultation.     Harrie Cazarez A. Gerilyn Pilgrim, M.D.     KAD/MEDQ  D:  12/21/2010  T:  12/21/2010  Job:  [id number]  Electronically Signed by Beryle Beams M.D. on 12/31/2010 01:56:02 PM

## 2011-11-07 ENCOUNTER — Other Ambulatory Visit: Payer: Self-pay | Admitting: Obstetrics and Gynecology

## 2011-11-07 DIAGNOSIS — Z139 Encounter for screening, unspecified: Secondary | ICD-10-CM

## 2011-12-05 ENCOUNTER — Ambulatory Visit (HOSPITAL_COMMUNITY): Payer: Medicare Other

## 2011-12-09 ENCOUNTER — Ambulatory Visit (HOSPITAL_COMMUNITY)
Admission: RE | Admit: 2011-12-09 | Discharge: 2011-12-09 | Disposition: A | Discharge status: Home / Self Care | Payer: Medicare Other | Source: Ambulatory Visit | Attending: Obstetrics and Gynecology | Admitting: Obstetrics and Gynecology

## 2011-12-09 DIAGNOSIS — Z139 Encounter for screening, unspecified: Secondary | ICD-10-CM

## 2011-12-09 DIAGNOSIS — Z1231 Encounter for screening mammogram for malignant neoplasm of breast: Secondary | ICD-10-CM | POA: Insufficient documentation

## 2012-02-25 ENCOUNTER — Ambulatory Visit (HOSPITAL_COMMUNITY)
Admission: RE | Admit: 2012-02-25 | Discharge: 2012-02-25 | Disposition: A | Discharge status: Home / Self Care | Payer: Medicare Other | Source: Ambulatory Visit | Attending: Family Medicine | Admitting: Family Medicine

## 2012-02-25 ENCOUNTER — Other Ambulatory Visit (HOSPITAL_COMMUNITY): Payer: Self-pay | Admitting: Family Medicine

## 2012-02-25 DIAGNOSIS — J40 Bronchitis, not specified as acute or chronic: Secondary | ICD-10-CM

## 2012-02-25 DIAGNOSIS — F17200 Nicotine dependence, unspecified, uncomplicated: Secondary | ICD-10-CM

## 2012-04-20 ENCOUNTER — Other Ambulatory Visit (HOSPITAL_COMMUNITY): Payer: Self-pay | Admitting: Family Medicine

## 2012-04-20 DIAGNOSIS — E785 Hyperlipidemia, unspecified: Secondary | ICD-10-CM

## 2012-04-20 DIAGNOSIS — I639 Cerebral infarction, unspecified: Secondary | ICD-10-CM

## 2012-04-27 ENCOUNTER — Ambulatory Visit (HOSPITAL_COMMUNITY)
Admission: RE | Admit: 2012-04-27 | Discharge: 2012-04-27 | Disposition: A | Discharge status: Home / Self Care | Payer: Medicare Other | Source: Ambulatory Visit | Attending: Family Medicine | Admitting: Family Medicine

## 2012-04-27 DIAGNOSIS — F17200 Nicotine dependence, unspecified, uncomplicated: Secondary | ICD-10-CM | POA: Insufficient documentation

## 2012-04-27 DIAGNOSIS — E785 Hyperlipidemia, unspecified: Secondary | ICD-10-CM

## 2012-04-27 DIAGNOSIS — E7800 Pure hypercholesterolemia, unspecified: Secondary | ICD-10-CM | POA: Insufficient documentation

## 2012-10-28 ENCOUNTER — Encounter (HOSPITAL_COMMUNITY): Payer: Self-pay

## 2012-10-28 ENCOUNTER — Emergency Department (HOSPITAL_COMMUNITY)
Admission: EM | Admit: 2012-10-28 | Discharge: 2012-10-28 | Disposition: A | Discharge status: Home / Self Care | Payer: Medicare Other | Attending: Emergency Medicine | Admitting: Emergency Medicine

## 2012-10-28 ENCOUNTER — Emergency Department (HOSPITAL_COMMUNITY): Payer: Medicare Other

## 2012-10-28 DIAGNOSIS — Z90710 Acquired absence of both cervix and uterus: Secondary | ICD-10-CM | POA: Insufficient documentation

## 2012-10-28 DIAGNOSIS — E7800 Pure hypercholesterolemia, unspecified: Secondary | ICD-10-CM | POA: Insufficient documentation

## 2012-10-28 DIAGNOSIS — Z9089 Acquired absence of other organs: Secondary | ICD-10-CM | POA: Insufficient documentation

## 2012-10-28 DIAGNOSIS — F17200 Nicotine dependence, unspecified, uncomplicated: Secondary | ICD-10-CM | POA: Insufficient documentation

## 2012-10-28 DIAGNOSIS — I10 Essential (primary) hypertension: Secondary | ICD-10-CM | POA: Insufficient documentation

## 2012-10-28 DIAGNOSIS — R110 Nausea: Secondary | ICD-10-CM | POA: Insufficient documentation

## 2012-10-28 DIAGNOSIS — R21 Rash and other nonspecific skin eruption: Secondary | ICD-10-CM

## 2012-10-28 DIAGNOSIS — Z8739 Personal history of other diseases of the musculoskeletal system and connective tissue: Secondary | ICD-10-CM | POA: Insufficient documentation

## 2012-10-28 DIAGNOSIS — Z8673 Personal history of transient ischemic attack (TIA), and cerebral infarction without residual deficits: Secondary | ICD-10-CM | POA: Insufficient documentation

## 2012-10-28 DIAGNOSIS — R1013 Epigastric pain: Secondary | ICD-10-CM | POA: Insufficient documentation

## 2012-10-28 DIAGNOSIS — R52 Pain, unspecified: Secondary | ICD-10-CM | POA: Insufficient documentation

## 2012-10-28 HISTORY — DX: Essential (primary) hypertension: I10

## 2012-10-28 HISTORY — DX: Personal history of other diseases of the musculoskeletal system and connective tissue: Z87.39

## 2012-10-28 HISTORY — DX: Pure hypercholesterolemia, unspecified: E78.00

## 2012-10-28 HISTORY — DX: Cerebral infarction, unspecified: I63.9

## 2012-10-28 LAB — COMPREHENSIVE METABOLIC PANEL
Albumin: 4.6 g/dL (ref 3.5–5.2)
BUN: 30 mg/dL — ABNORMAL HIGH (ref 6–23)
Creatinine, Ser: 0.77 mg/dL (ref 0.50–1.10)
GFR calc Af Amer: >90 mL/min (ref >90)
Glucose, Bld: 99 mg/dL (ref 70–99)
Total Bilirubin: 0.4 mg/dL (ref 0.3–1.2)
Total Protein: 8.4 g/dL — ABNORMAL HIGH (ref 6.0–8.3)

## 2012-10-28 LAB — CBC WITH DIFFERENTIAL/PLATELET
Basophils Relative: 0 % (ref 0–1)
Eosinophils Absolute: 0.2 10*3/uL (ref 0.0–0.7)
HCT: 45 % (ref 36.0–46.0)
Hemoglobin: 15.2 g/dL — ABNORMAL HIGH (ref 12.0–15.0)
Lymphs Abs: 2.4 10*3/uL (ref 0.7–4.0)
MCH: 32.4 pg (ref 26.0–34.0)
MCHC: 33.8 g/dL (ref 30.0–36.0)
MCV: 95.9 fL (ref 78.0–100.0)
Monocytes Absolute: 0.8 10*3/uL (ref 0.1–1.0)
Monocytes Relative: 7 % (ref 3–12)
WBC: 12.4 10*3/uL — ABNORMAL HIGH (ref 4.0–10.5)

## 2012-10-28 MED ORDER — ONDANSETRON 4 MG PO TBDP: ORAL_TABLET | ORAL | Status: DC

## 2012-10-28 MED ORDER — SODIUM CHLORIDE 0.9 % IV SOLN
1000.0000 mL | Freq: Once | INTRAVENOUS | Status: AC
  Administered 2012-10-28: 1000 mL via INTRAVENOUS

## 2012-10-28 MED ORDER — IOHEXOL 300 MG/ML  SOLN
50.0000 mL | Freq: Once | INTRAMUSCULAR | Status: AC | PRN
  Administered 2012-10-28: 50 mL via ORAL

## 2012-10-28 MED ORDER — ESOMEPRAZOLE MAGNESIUM 40 MG PO CPDR: 40.0000 mg | DELAYED_RELEASE_CAPSULE | Freq: Every day | ORAL | Status: DC

## 2012-10-28 MED ORDER — IOHEXOL 300 MG/ML  SOLN
100.0000 mL | Freq: Once | INTRAMUSCULAR | Status: AC | PRN
  Administered 2012-10-28: 100 mL via INTRAVENOUS

## 2012-10-28 MED ORDER — SODIUM CHLORIDE 0.9 % IV SOLN: 1000.0000 mL | INTRAVENOUS | Status: DC

## 2012-10-28 MED ORDER — ONDANSETRON HCL 4 MG/2ML IJ SOLN
4.0000 mg | Freq: Once | INTRAMUSCULAR | Status: AC
  Administered 2012-10-28: 4 mg via INTRAVENOUS
  Filled 2012-10-28: qty 2

## 2012-10-28 NOTE — ED Notes (Signed)
Pt c/o upper abdominal pain since this morning. Pt states she felt nauseous before going to bed last night but denies vomiting or diarrhea. Pt reports lack of appetite today. Pt any other symptoms at this time.

## 2012-10-28 NOTE — ED Notes (Signed)
Pt reports for the past week to 1 1/2 weeks has had rash and itching to r arm.  Reports was on antibiotics and finished them yesterday.  Reports was nauseated yesterday.  Woke up around 0530 this morning with upper abd pain.  C/O nausea, no vomiting.  Denies diarrhea.

## 2012-10-28 NOTE — ED Notes (Signed)
EKG performed in triage

## 2012-10-28 NOTE — ED Provider Notes (Signed)
History     CSN: [id number]  Arrival date & time 10/28/12  1819   First MD Initiated Contact with Patient 10/28/12 1905      Chief Complaint  Patient presents with  . Abdominal Pain    (Consider location/radiation/quality/duration/timing/severity/associated sxs/prior treatment) HPI Patient reports she has been having rash off and on for a long period time. She states she'll have the rash for a couple months and then it goes away and comes back. She reports she was on prednisone and an unknown antibiotic that she finished last night after a ten-day course. She was seen yesterday by a dermatologist and states she was started on different medication that she cannot tell me what they were. She reports at 5:30 this morning she started having "the worst" epigastric abdominal pain that has been constant and sharp all day. She is unable to describe how bad her pain is now except that it is mild. She has nausea which actually started yesterday however she's not had any vomiting. She denies any diarrhea or fever. She states she's never had this abdominal pain before. She is status post cholecystectomy.  PCP Dr Renard Matter  Past Medical History  Diagnosis Date  . Hx of degenerative disc disease   . Hypertension   . Hypercholesterolemia   . Stroke     Past Surgical History  Procedure Laterality Date  . Cholecystectomy    . Abdominal hysterectomy    . Breast surgery    . Appendectomy    . Tonsillectomy      No family history on file.  History  Substance Use Topics  . Smoking status: Current Every Day Smoker  . Smokeless tobacco: Not on file  . Alcohol Use: No  lives at home Lives alone  [place] History   Grav Para Term Preterm Abortions TAB SAB Ect Mult Living                  Review of Systems  All other systems reviewed and are negative.    Allergies  Review of patient's allergies indicates no known allergies.  Home Medications  No current outpatient prescriptions on  file.  BP 162/106  Pulse 96  Temp(Src) 97.2 F (36.2 C) (Oral)  Resp 18  Ht 5\' 5"  (1.651 m)  Wt 181 lb (82.101 kg)  BMI 30.12 kg/m2  SpO2 94%  Vital signs normal except for diastolic hypertension   Physical Exam  Nursing note and vitals reviewed. Constitutional: She is oriented to person, place, and time. She appears well-developed and well-nourished.  Non-toxic appearance. She does not appear ill. No distress.  HENT:  Head: Normocephalic and atraumatic.  Right Ear: External ear normal.  Left Ear: External ear normal.  Nose: Nose normal. No mucosal edema or rhinorrhea.  Mouth/Throat: Oropharynx is clear and moist and mucous membranes are normal. No dental abscesses or edematous.  Eyes: Conjunctivae and EOM are normal. Pupils are equal, round, and reactive to light.  Neck: Normal range of motion and full passive range of motion without pain. Neck supple.  Cardiovascular: Normal rate, regular rhythm and normal heart sounds.  Exam reveals no gallop and no friction rub.   No murmur heard. Pulmonary/Chest: Effort normal and breath sounds normal. No respiratory distress. She has no wheezes. She has no rhonchi. She has no rales. She exhibits no tenderness and no crepitus.  Abdominal: Soft. Normal appearance and bowel sounds are normal. She exhibits no distension. There is tenderness. There is no rebound and no guarding.  Musculoskeletal: Normal range of motion. She exhibits no edema and no tenderness.  Moves all extremities well.   Neurological: She is alert and oriented to person, place, and time. She has normal strength. No cranial nerve deficit.  Skin: Skin is warm, dry and intact. Rash noted. No erythema. No pallor.  Nicotine stains to her right index and middle fingers, patient noted to have diffuse redness on her arms that she is scratching. It does not have the appearance of cellulitis. There is no discrete urticarial lesions seen. There is no pustular lesions.  Psychiatric:  She has a normal mood and affect. Her speech is normal and behavior is normal. Her mood appears not anxious.    ED Course  Procedures (including critical care time)  Medications  0.9 %  sodium chloride infusion (1,000 mLs Intravenous New Bag/Given 10/28/12 1946)    Followed by  0.9 %  sodium chloride infusion (not administered)  ondansetron (ZOFRAN) injection 4 mg (4 mg Intravenous Given 10/28/12 1947)  iohexol (OMNIPAQUE) 300 MG/ML solution 50 mL (50 mLs Oral Contrast Given 10/28/12 2125)  iohexol (OMNIPAQUE) 300 MG/ML solution 100 mL (100 mLs Intravenous Contrast Given 10/28/12 2211)   Patient refused pain medicine  10:20 pt given results of her lab tests, discussed getting a CT of her abdomen. Still refusing pain medications.   Turned over at change of shift to get CT results.   Results for orders placed during the hospital encounter of 10/28/12  CBC WITH DIFFERENTIAL      Result Value Range   WBC 12.4 (*) 4.0 - 10.5 K/uL   RBC 4.69  3.87 - 5.11 MIL/uL   Hemoglobin 15.2 (*) 12.0 - 15.0 g/dL   HCT 47.8  29.5 - 62.1 %   MCV 95.9  78.0 - 100.0 fL   MCH 32.4  26.0 - 34.0 pg   MCHC 33.8  30.0 - 36.0 g/dL   RDW 30.8  65.7 - 84.6 %   Platelets 258  150 - 400 K/uL   Neutrophils Relative 72  43 - 77 %   Neutro Abs 9.0 (*) 1.7 - 7.7 K/uL   Lymphocytes Relative 19  12 - 46 %   Lymphs Abs 2.4  0.7 - 4.0 K/uL   Monocytes Relative 7  3 - 12 %   Monocytes Absolute 0.8  0.1 - 1.0 K/uL   Eosinophils Relative 2  0 - 5 %   Eosinophils Absolute 0.2  0.0 - 0.7 K/uL   Basophils Relative 0  0 - 1 %   Basophils Absolute 0.1  0.0 - 0.1 K/uL  COMPREHENSIVE METABOLIC PANEL      Result Value Range   Sodium 136  135 - 145 mEq/L   Potassium 3.7  3.5 - 5.1 mEq/L   Chloride 95 (*) 96 - 112 mEq/L   CO2 31  19 - 32 mEq/L   Glucose, Bld 99  70 - 99 mg/dL   BUN 30 (*) 6 - 23 mg/dL   Creatinine, Ser 9.62  0.50 - 1.10 mg/dL   Calcium 9.9  8.4 - 95.2 mg/dL   Total Protein 8.4 (*) 6.0 - 8.3 g/dL   Albumin  4.6  3.5 - 5.2 g/dL   AST 37  0 - 37 U/L   ALT 50 (*) 0 - 35 U/L   Alkaline Phosphatase 211 (*) 39 - 117 U/L   Total Bilirubin 0.4  0.3 - 1.2 mg/dL   GFR calc non Af Amer 83 (*) >90 mL/min  GFR calc Af Amer >90  >90 mL/min  LIPASE, BLOOD      Result Value Range   Lipase 196 (*) 11 - 59 U/L   Laboratory interpretation all normal except leukocytosis, elevated lipase   CT AP pending    Date: 10/28/2012  Rate: 88  Rhythm: normal sinus rhythm  QRS Axis: normal  Intervals: normal  ST/T Wave abnormalities: normal  Conduction Disutrbances:none  Narrative Interpretation:   Old EKG Reviewed: none available    1. Abdominal pain, acute, epigastric   2. Nausea   3. Rash    Disposition pending   Devoria Albe, MD, FACEP    MDM  patiently recently finished a course of antibiotics and presents with abdominal pain and elevated lipase. She is status post cholecystectomy. Ultrasound not available at night. CT scan will show if she has any inflammatory changes of her GI tract including her pancreas.           Ward Givens, MD 10/29/12 [phone]

## 2013-01-04 ENCOUNTER — Other Ambulatory Visit: Payer: Self-pay | Admitting: Obstetrics and Gynecology

## 2013-01-04 DIAGNOSIS — Z139 Encounter for screening, unspecified: Secondary | ICD-10-CM

## 2013-01-12 ENCOUNTER — Ambulatory Visit (HOSPITAL_COMMUNITY)
Admission: RE | Admit: 2013-01-12 | Discharge: 2013-01-12 | Disposition: A | Discharge status: Home / Self Care | Payer: Medicare Other | Source: Ambulatory Visit | Attending: Obstetrics and Gynecology | Admitting: Obstetrics and Gynecology

## 2013-01-12 DIAGNOSIS — Z1231 Encounter for screening mammogram for malignant neoplasm of breast: Secondary | ICD-10-CM | POA: Insufficient documentation

## 2013-01-12 DIAGNOSIS — Z139 Encounter for screening, unspecified: Secondary | ICD-10-CM

## 2013-02-06 ENCOUNTER — Other Ambulatory Visit: Payer: Self-pay | Admitting: Obstetrics and Gynecology

## 2013-03-19 ENCOUNTER — Other Ambulatory Visit (HOSPITAL_COMMUNITY): Payer: Self-pay | Admitting: Family Medicine

## 2013-03-19 ENCOUNTER — Ambulatory Visit (HOSPITAL_COMMUNITY)
Admission: RE | Admit: 2013-03-19 | Discharge: 2013-03-19 | Disposition: A | Discharge status: Home / Self Care | Payer: Medicare Other | Source: Ambulatory Visit | Attending: Family Medicine | Admitting: Family Medicine

## 2013-03-19 DIAGNOSIS — R52 Pain, unspecified: Secondary | ICD-10-CM

## 2013-03-19 DIAGNOSIS — L0390 Cellulitis, unspecified: Secondary | ICD-10-CM

## 2013-03-19 DIAGNOSIS — M25579 Pain in unspecified ankle and joints of unspecified foot: Secondary | ICD-10-CM | POA: Insufficient documentation

## 2013-03-19 DIAGNOSIS — M79609 Pain in unspecified limb: Secondary | ICD-10-CM | POA: Insufficient documentation

## 2013-03-19 DIAGNOSIS — L02419 Cutaneous abscess of limb, unspecified: Secondary | ICD-10-CM | POA: Insufficient documentation

## 2013-04-05 ENCOUNTER — Telehealth (HOSPITAL_COMMUNITY): Payer: Self-pay | Admitting: Physical Therapy

## 2013-04-05 ENCOUNTER — Ambulatory Visit (HOSPITAL_COMMUNITY)
Admission: RE | Admit: 2013-04-05 | Discharge: 2013-04-05 | Disposition: A | Discharge status: Home / Self Care | Payer: Medicare Other | Source: Ambulatory Visit | Attending: General Surgery | Admitting: General Surgery

## 2013-04-05 ENCOUNTER — Ambulatory Visit (HOSPITAL_COMMUNITY): Payer: Self-pay | Admitting: Physical Therapy

## 2013-04-05 DIAGNOSIS — I87309 Chronic venous hypertension (idiopathic) without complications of unspecified lower extremity: Secondary | ICD-10-CM | POA: Insufficient documentation

## 2013-04-05 DIAGNOSIS — S81009A Unspecified open wound, unspecified knee, initial encounter: Secondary | ICD-10-CM | POA: Insufficient documentation

## 2013-04-05 DIAGNOSIS — IMO0001 Reserved for inherently not codable concepts without codable children: Secondary | ICD-10-CM | POA: Insufficient documentation

## 2013-04-05 DIAGNOSIS — T148XXA Other injury of unspecified body region, initial encounter: Secondary | ICD-10-CM | POA: Insufficient documentation

## 2013-04-05 DIAGNOSIS — L98492 Non-pressure chronic ulcer of skin of other sites with fat layer exposed: Secondary | ICD-10-CM

## 2013-04-05 DIAGNOSIS — I87301 Chronic venous hypertension (idiopathic) without complications of right lower extremity: Secondary | ICD-10-CM

## 2013-04-05 NOTE — Progress Notes (Addendum)
Physical Therapy Evaluation  Patient Details  Name: Connie Wood MRN: [id number] Date of Birth: Nov 25, 1940  Today's Date: 04/05/2013 Time:900  - 945  charge:  Evaluation : profore              Visit#: 1 of 8  Re-eval: 05/05/13    Authorization: medicare    Authorization Visit#: 1 of 8   Past Medical History:  Past Medical History  Diagnosis Date  . Hx of degenerative disc disease   . Hypertension   . Hypercholesterolemia   . Stroke    Past Surgical History:  Past Surgical History  Procedure Laterality Date  . Cholecystectomy    . Abdominal hysterectomy    . Breast surgery    . Appendectomy    . Tonsillectomy         Problem List Patient Active Problem List   Diagnosis Date Noted  . Nonhealing nonsurgical wound 04/05/2013  . Stasis edema 04/05/2013       GP Functional Assessment Tool Used: clinical judgement Functional Limitation: Other PT primary Other PT Primary Current Status (W0981): At least 80 percent but less than 100 percent impaired, limited or restricted Other PT Primary Goal Status (X9147): At least 1 percent but less than 20 percent impaired, limited or restricted                                                                                                          Physical Therapy Wound Care Evaluation  Referring physician/Next Apt: unknown Medical Diagnosis: non-healing wound Previous Therapy: see below Current wound care treatment: debridement; multilayer compression bandaging  Subjective: When did it begin:Connie Wood states that she fell approximately 5-6 weeks ago and a large hemotoma occured on the inside of her Rt leg.  When it did not go away she went to her family MD who drained the hematoma on two different occasions and then referred her to Connie Wood.  Connie Wood debrided the wourn and then referred Connie Wood to therapy.  Is patient agreeable to wound care? yes   Pain: 8/10 Objective:   Wound 1:    Location: medial aspect of Rt LE Length: 5.5 cm Width: 3.5 cm Depth: .3 cm Tunneling: none Granulation:  10% Slough: 87% Other: black eschar 3% Drainage (amount/description) Swellling:  Pt ankle circumference Rt 22.5 cm Lt 24; 10cm superior from distal malleoli Rt 25.5 Lt 22.6; 15cm Rt 31.5 Lt 27.3  Dressing Used: honey with profore.  Tube honey placed onto film used to cover wound Dressing: Sharps Used/Location: sissor, scapel and forceps used along entire wound base removing slough  Remove: slough  Clinical  Impressions: Pt is a 72 year old referred to OP PT for wound care with impairments listed below.  Pt will benefit from skilled OP PT wound care to address current impairments.  Increased necrotic tissue Decreased Granulation Tissue Altered Sensation Venous Insufficiency Decreased knowledge of wound care PT. Plan: Selective sharps debridement, Dressing Change, Pulse-Lavage,, Pt/family Education  Frequency/Duration:  _____2_x/week for ____4_weeks.  PT Prognosis Wound Therapy - Potential for Goals:  Excellent Good Fair Poor  Goals: Wound Therapy Goals - Improve the function of patient's integumentary system by progressing the wound(s) through the phases of wound healing by:  Decrease Necrotic Tissue to: 0  Decrease Necrotic Tissue - Progress: Goal set today  Increase Granulation Tissue to: 100  Increase Granulation Tissue - Progress: Goal set today  Decrease Length/Width/Depth by (cm): 3 cm x2 cm x.. 2cm Decrease Length/Width/Depth - Progress: Goal set today  Improve Drainage Characteristics:  Improve Drainage Characteristics -  Scant Progress: Goal set today  Patient/Family will be able to :  Patient/Family Instruction Goal -  Self dressing change and benefit of compression.Progress: Goal set today  Goals/treatment plan/discharge plan were made with and agreed upon by patient/family: Yes   Your signature is required to indicate approval of the treatment plan as stated  above.  Please sign and return making a copy for your files.  You may hard copy or send electronically.  Please check one: ___1.  Approve of this plan  ___2.  Approve of this plan with the following changes.   ____________________________                             _____________ Physician                                                                      Date

## 2013-04-06 ENCOUNTER — Telehealth (HOSPITAL_COMMUNITY): Payer: Self-pay | Admitting: Physical Therapy

## 2013-04-08 ENCOUNTER — Ambulatory Visit (HOSPITAL_COMMUNITY)
Admission: RE | Admit: 2013-04-08 | Discharge: 2013-04-08 | Disposition: A | Discharge status: Home / Self Care | Payer: Medicare Other | Source: Ambulatory Visit | Attending: General Surgery | Admitting: General Surgery

## 2013-04-08 ENCOUNTER — Ambulatory Visit (HOSPITAL_COMMUNITY): Payer: Medicare Other | Admitting: Physical Therapy

## 2013-04-08 NOTE — Progress Notes (Signed)
Physical Therapy Treatment Patient Details  Name: Connie Wood MRN: [id number] Date of Birth: [birth year]  Today's Date: 04/08/2013 Time: 4098-1191 PT Time Calculation (min): 40 min  Visit#: 2 of 8  Re-eval: 05/05/13 Charges: Selective debridement (= or < 20 cm)   Authorization: medicare  Authorization Visit#: 2 of 8   Physical Therapy Wound Care Treatment  Subjective: Pt states that she took top layer of Profore wrap off secondary to pain  Is patient agreeable to wound care? Yes.  Pain: 0/10   Objective:  Location: medial aspect of Rt LE  Length: 5.5 cm (1013/14) Width: 3.5 cm (1013/14) Depth: .3 cm (1013/14) Tunneling: none  Granulation: 15%  Slough: 82%  Other: black eschar 3% Drainage (amount/description): Copious serosanguineous Swelling: Pt ankle circumference Rt 22.5 cm Lt 14; 10cm superior from distal malleoli Rt 25.5 Lt 22.6; 15cm Rt 31.5 Lt 27.3 (04/05/13) Dressing: Silver hydrofiber, ABD, kerlex, coban Sharps Used/Location: scalpel and forceps/entire wound Remove: slough  Clinical  Impressions Statement: Wound appears to be progressing well. Increased granulation noted. Pt is sensitive to debridement but tolerates tx well. Changed dressing type to silver alginate secondary to copious amount of drainage and significant amount of necrotic tissue. Pt request not to use Profore wrap as it was too painful. Wrapped LE with kerlex followed by coban to control swelling.    Plan: Continue wound care per PT POC.  Seth Bake, PTA 04/08/2013, 3:11 PM

## 2013-04-12 ENCOUNTER — Ambulatory Visit (HOSPITAL_COMMUNITY)
Admission: RE | Admit: 2013-04-12 | Discharge: 2013-04-12 | Disposition: A | Discharge status: Home / Self Care | Payer: Medicare Other | Source: Ambulatory Visit | Attending: General Surgery | Admitting: General Surgery

## 2013-04-12 NOTE — Progress Notes (Signed)
Physical Therapy Treatment Patient Details  Name: Connie Wood MRN: [id number] Date of Birth: 1940/07/03  Today's Date: 04/12/2013 Time: 1140-1218 PT Time Calculation (min): 38 min Charges: Selective debridement (= or < 20 cm)   Visit#: 3 of 8  Re-eval: 05/05/13  Authorization: medicare  Authorization Visit#: 3 of 8   Physical Therapy Wound Care Treatment  Subjective: Pt states that she took top layer of Profore wrap off secondary to pain  Is patient agreeable to wound care? Yes.  Pain: 0/10  Objective:  Location: medial aspect of Rt LE  Length: 5.5 cm (1013/14)  Width: 3.5 cm (1013/14)  Depth: .3 cm (1013/14)  Tunneling: none  Granulation: 18%  Slough: 80%  Other: black eschar 2%  Drainage (amount/description): Copious serosanguineous  Swelling: Pt ankle circumference Rt 22.5 cm Lt 14; 10cm superior from distal malleoli Rt 25.5 Lt 22.6; 15cm Rt 31.5 Lt 27.3 (04/05/13)  Dressing: Xeroform on irritated area, silver hydrofiber, ABD, kerlex, coban  Sharps Used/Location: scalpel and forceps/entire wound  Remove: slough,dry skin at perimeter Clinical Impressions Statement: Wound appears to be progressing well. Granulation continues to increase. Irritation noted on anterior surface of leg just distal to knee. Xeroform applied to this area to decrease irritation and improve skin integrity. No lotion used this session. Continued with silver alginate as wound appears to have responded well to it. Wrapped LE with kerlex followed by coban to control swelling.  Plan: Continue wound care per PT POC.  Seth Bake, PTA  04/12/2013, 12:24 PM

## 2013-04-15 ENCOUNTER — Ambulatory Visit (HOSPITAL_COMMUNITY)
Admission: RE | Admit: 2013-04-15 | Discharge: 2013-04-15 | Disposition: A | Discharge status: Home / Self Care | Payer: Medicare Other | Source: Ambulatory Visit | Attending: Family Medicine | Admitting: Family Medicine

## 2013-04-15 DIAGNOSIS — I87301 Chronic venous hypertension (idiopathic) without complications of right lower extremity: Secondary | ICD-10-CM

## 2013-04-15 DIAGNOSIS — L98492 Non-pressure chronic ulcer of skin of other sites with fat layer exposed: Secondary | ICD-10-CM

## 2013-04-15 NOTE — Progress Notes (Addendum)
  Patient Details  Name: Connie Wood MRN: [id number] Date of Birth: Oct 19, 1940  Today's Date: 04/15/2013 Time:11:15  - 11:48  debridement 11:15-11:48 Visit#: 4 of 8 Re-eval: 05/05/13 Physical Therapy Wound Care Treatment   Medical Diagnosis: non-healing wound Previous Therapy: none  Subjective:  Wound is feeling better.  Is patient agreeable to wound care?  yes  Pain: 3/10  Objective:  Wound 1  Location:  Rt medial aspect of LE     Length: 5.0 cm Depth:  .2 cm Width:  3.0 cn Granulation:  40% Slough: 60% Drainage (amount/description):  min Other:  Decreased swelling  Dressing Type:  Sharps Used/Location: forceps and scapel Tissue Remove: slough   Clinical  Impressions Statement: Pt wound continues to show increased granulation as well as decreasing in size.  Pt surrounding tissue is very dry therefore placed vaseline on LE; xeroform on rash on upper LE, wound bed with carraklenz followed by Aquacell with 4x4, kerlix and coban.   Plan:  Continue with selective debridement until wound is 100% granulated.  Celena Lanius,CINDY 04/15/2013, 12:14 PM

## 2013-04-19 ENCOUNTER — Ambulatory Visit (HOSPITAL_COMMUNITY)
Admission: RE | Admit: 2013-04-19 | Discharge: 2013-04-19 | Disposition: A | Discharge status: Home / Self Care | Payer: Medicare Other | Source: Ambulatory Visit | Attending: General Surgery | Admitting: General Surgery

## 2013-04-19 NOTE — Progress Notes (Signed)
Physical Therapy  Wound care Treatment Patient Details  Name: Connie Wood MRN: [id number] Date of Birth: 12-26-1940  Today's Date: 04/19/2013 Time: 1110-1140 PT Time Calculation (min): 30 min  Visit#: 5 of 8  Re-eval: 05/05/13 Authorization: medicare  Authorization Visit#: 5 of 8  Charges:  Deb<20cm    Physical Therapy Wound Care Treatment  Subjective: Pt states her leg continues to itch bad.  Noted she has pushed dressing down to scratch LE.  Pt is agreeable to wound care? Yes.   Pain: 0/10  Objective:  Location: medial aspect of Rt LE  Length: 5.5 cm (04/05/13)  Width: 3.5 cm (04/05/13)  Depth: .3 cm (04/05/13)  Tunneling: none  Granulation: 65% Slough: 35%  Other: none Drainage (amount/description): minimal serosanguineous  Swelling: Pt ankle circumference Rt 22.5 cm Lt 14; 10cm superior from distal malleoli Rt 25.5 Lt 22.6; 15cm Rt 31.5 Lt 27.3 (04/05/13)  Dressing: Xeroform , 4X4 , kerlex, coban .  Medicated cream applied to distal LE to decrease itch (pt. Brought from home) Sharps Used/Location: scalpel and forceps/entire wound  Remove: slough,dry skin at perimeter   Clinical Impressions Statement:  Granulation increased today. Less dryness noted on lateral LE.  Entire LE cleansed and medicated cream applied.  Changed dressing to xeroform due to increased granulation and dryness of wound.  Wrapped LE with kerlex followed by coban to control swelling.  Plan: Continue wound care per PT POC.   Problem List Patient Active Problem List   Diagnosis Date Noted  . Nonhealing nonsurgical wound 04/05/2013  . Stasis edema 04/05/2013      Lurena Nida, PTA/CLT 04/19/2013, 12:19 PM

## 2013-04-22 ENCOUNTER — Ambulatory Visit (HOSPITAL_COMMUNITY)
Admission: RE | Admit: 2013-04-22 | Discharge: 2013-04-22 | Disposition: A | Discharge status: Home / Self Care | Payer: Medicare Other | Source: Ambulatory Visit | Attending: Family Medicine | Admitting: Family Medicine

## 2013-04-22 NOTE — Progress Notes (Signed)
Physical Therapy - Wound Therapy  Treatment   Patient Details  Name: Connie Wood MRN: [id number] Date of Birth: 01-30-1941  Today's Date: 04/22/2013 Time: 8295-6213 Time Calculation (min): 30 min Visit#: 6 of 8  Re-eval: 05/05/13 Charges:  Reece Levy <20cm  Physical Therapy Wound Care Treatment  Subjective: Pt states her leg did not itch as bad. Dressing stayed up on leg and she did not try to itch her leg.   Pt is agreeable to wound care? Yes.   Pain: 0/10  Objective:  Location: medial aspect of Rt LE  Length: 5.5 cm (04/05/13)  Width: 3.5 cm (04/05/13)  Depth: .3 cm (04/05/13)  Tunneling: none  Granulation: 70%  Slough: 30%  Other: none  Drainage (amount/description): minimal serosanguineous  Swelling: Pt ankle circumference Rt 22.5 cm Lt 14; 10cm superior from distal malleoli Rt 25.5 Lt 22.6; 15cm Rt 31.5 Lt 27.3 (04/05/13)  Dressing: Xeroform , 4X4 , kerlex, coban . Medicated cream applied to distal LE to decrease itch (pt. Brought from home)  Sharps Used/Location:  forceps/entire wound  Remove: slough,dry skin at perimeter  Clinical Impressions Statement: Granulation continues to increase.   Less dryness noted on lateral LE. Entire LE cleansed and medicated cream applied. Changed dressing to xeroform due to increased granulation and dryness of wound. Wrapped LE with kerlex followed by coban to control swelling.  Plan: Continue wound care per PT POC.     Problem List Patient Active Problem List   Diagnosis Date Noted  . Nonhealing nonsurgical wound 04/05/2013  . Stasis edema 04/05/2013    GP    Lurena Nida, PTA/CLT 04/22/2013, 11:46 AM

## 2013-04-27 ENCOUNTER — Ambulatory Visit (HOSPITAL_COMMUNITY)
Admission: RE | Admit: 2013-04-27 | Discharge: 2013-04-27 | Disposition: A | Discharge status: Home / Self Care | Payer: Medicare Other | Source: Ambulatory Visit | Attending: General Surgery | Admitting: General Surgery

## 2013-04-27 DIAGNOSIS — S81009A Unspecified open wound, unspecified knee, initial encounter: Secondary | ICD-10-CM | POA: Insufficient documentation

## 2013-04-27 DIAGNOSIS — IMO0001 Reserved for inherently not codable concepts without codable children: Secondary | ICD-10-CM | POA: Insufficient documentation

## 2013-04-27 NOTE — Progress Notes (Signed)
Physical Therapy Wound care Treatment Patient Details  Name: Connie Wood MRN: [id number] Date of Birth: October 15, 1940  Today's Date: 04/27/2013 Time: 4696-2952 PT Time Calculation (min): 35 min Visit#: 7 of 8  Re-eval: 05/05/13 Authorization: medicare  Authorization Visit#: 7 of 8  Charges:  Deb<20cm   Physical Therapy Wound Care Treatment   Subjective: Pt states she fell over the weekend and twisted her Rt ankle.  States it hurts but she doesn't think it is broken, only sprained.  Pt is agreeable to wound care? Yes.   Pain: 0/10  Objective:  Location: medial aspect of Rt LE  Length: 5.5 cm (04/05/13)  Width: 3.5 cm (04/05/13)  Depth: .3 cm (04/05/13)  Tunneling: none  Granulation: 85%  Slough: 15%  Other: none  Drainage (amount/description): minimal serosanguineous  Swelling: Pt ankle circumference Rt 22.5 cm Lt 14; 10cm superior from distal malleoli Rt 25.5 Lt 22.6; 15cm Rt 31.5 Lt 27.3 (04/05/13)  Dressing: Xeroform , 4X4 , kerlex, coban . Medicated cream applied to distal LE to decrease itch (pt. Brought from home)  Sharps Used/Location: scapel entire wound  Remove: slough from central wound,dry skin at perimeter edges.   Clinical Impressions Statement:   No visible swelling of Rt ankle, however with noted brusing inferior lateral malleoli.  Granulation continues to increase with improved approximation of wound border. Less dryness noted on lateral LE. Entire LE cleansed and medicated cream applied. Xeroform apperars to be working well with wound. Wrapped LE with kerlex followed by coban to control swelling.  Plan: Continue wound care per PT POC. Re-evaluate next visit.   Problem List Patient Active Problem List   Diagnosis Date Noted  . Nonhealing nonsurgical wound 04/05/2013  . Stasis edema 04/05/2013      Lurena Nida, PTA/CLT 04/27/2013, 5:13 PM

## 2013-04-30 ENCOUNTER — Ambulatory Visit (HOSPITAL_COMMUNITY)
Admission: RE | Admit: 2013-04-30 | Discharge: 2013-04-30 | Disposition: A | Discharge status: Home / Self Care | Payer: Medicare Other | Source: Ambulatory Visit | Attending: Family Medicine | Admitting: Family Medicine

## 2013-04-30 NOTE — Progress Notes (Signed)
  Patient Details  Name: Connie Wood MRN: [id number] Date of Birth: [birth year]  Today's Date: 04/30/2013 Time:1353  - 1420   Visit#: 8 of 16 Re-eval: 05/30/13 Physical Therapy Wound Care reassessment  Referring physician/Next Apt: Medical Diagnosis: nonhealing wound Previous Therapy: seen in this clinic for 8 visits Current wound care treatment: debridement and dressing change.  Subjective: Pt states that her wound only hurts when we are working on it  Pain: 5/10 Objective: Wound continues to decrease in size and improve in granulation.  Wound 1:   Location: Rt LE Length: 4.0 cm was 5.0 Width: 2.0 cm ws 3.5 Depth: .1 cm was .3 Tunneling: none Granulation: 60% was 10% Slough: 40% was 90% Drainage (amount/description): moderate was moderate to copious   Dressing Used: honey Dressing: 4x4; kerlix, coban Sharps Used/Location:  forceps Tissue Remove: slough  Clinical  Impressions: Pt is a 72 year old referred to OP PT for wound care .    Pt will continue to benefit from skilled OP PT wound care to address current impairments listed below: Increased necrotic tissue Decreased Granulation Tissue Altered Sensation Decreased knowledge of wound care   PT. Plan: Selective sharps debridement, Dressing Change,  Pt/family Education  Frequency/Duration:  _____2_x/week for __4___weeks. For a total of 8 weeks as pt has already been seen for 2 times a week for four weeks.  PT Prognosis Wound Therapy - Potential for Goals:  Good  Goals: Wound Therapy Goals - Improve the function of patient's integumentary system by progressing the wound(s) through the phases of wound healing by:  Decrease Necrotic Tissue to: 0  Decrease Necrotic Tissue - Progress: Goal set today  Increase Granulation Tissue to: 100  Increase Granulation Tissue - Progress: Goal set today  Decrease Length/Width/Depth by (cm):  Decrease Length/Width/Depth - Progress: Goal set today  Improve Drainage  Characteristics:  Improve Drainage Characteristics - Progress: Goal set today  Patient/Family will be able to :  Patient/Family Instruction Goal - Progress: Goal set today  Additional Wound Therapy Goal:  Additional Wound Therapy Goal - Progress: Goal set today  Goals/treatment plan/discharge plan were made with and agreed upon by patient/family: Yes     Klye Besecker,CINDY 04/30/2013, 3:46 PM   Your signature is required to indicate approval of the treatment plan as stated above.  Please sign and return making a copy for your files.  You may hard copy or send electronically.  Please check one: ___1.  Approve of this plan  ___2.  Approve of this plan with the following changes.   ____________________________                             _____________ Physician                                                                      Date

## 2013-05-03 ENCOUNTER — Ambulatory Visit (HOSPITAL_COMMUNITY)
Admission: RE | Admit: 2013-05-03 | Discharge: 2013-05-03 | Disposition: A | Discharge status: Home / Self Care | Payer: Medicare Other | Source: Ambulatory Visit | Attending: Family Medicine | Admitting: Family Medicine

## 2013-05-03 NOTE — Progress Notes (Signed)
Physical Therapy Treatment Patient Details  Name: Connie Wood MRN: [id number] Date of Birth: [birth year]  Today's Date: 05/03/2013 Time: 1308-1350 PT Time Calculation (min): 42 min Charges:  Deb <20cm   Physical Therapy Wound Care     Current wound care treatment: debridement and dressing change.  Subjective: Pt states that her wound only hurts when we are working on it   Objective: Wound continues to decrease in size and improve in granulation.  Wound 1:  Location: Rt LE  Length: 4.0 cm was 5.0  Width: 2.0 cm ws 3.5  Depth: .1 cm was .3  Tunneling: none  Granulation: 75% Slough: 25% Drainage (amount/description): minimal Dressing Used: xeroform Dressing: 4x4; kerlix, coban  Sharps Used/Location: forceps  Tissue Remove: slough   Clinical Impressions:  Wound continues to increase in granulation.   Plan:  Continue current woundcare.   Lurena Nida, PTA/CLT 05/03/2013, 2:25 PM

## 2013-05-06 ENCOUNTER — Ambulatory Visit (HOSPITAL_COMMUNITY)
Admission: RE | Admit: 2013-05-06 | Discharge: 2013-05-06 | Disposition: A | Discharge status: Home / Self Care | Payer: Medicare Other | Source: Ambulatory Visit | Attending: Family Medicine | Admitting: Family Medicine

## 2013-05-06 NOTE — Progress Notes (Signed)
Physical Therapy Wound Care Treatment Patient Details  Name: Connie Wood MRN: [id number] Date of Birth: 08/26/1940  Today's Date: 05/06/2013 Time: 1055-1130 PT Time Calculation (min): 35 min Visit#: 10 of 16  Re-eval: 05/30/13 Authorization: medicare; gcode updated on visit 8  Authorization Time Period:    Authorization Visit#: 10 of 18  Charges:  Deb <20cm  Wound care:   Subjective: Pt states that her hip is bothering her today.  States she no longer has pain in her wound, only her Rt ankle from twisting it a couple weeks ago.  Objective: Wound continues to decrease in size and improve in granulation.  Wound 1:  Location: Rt LE  Length: 3.4 cm today (was 4.0 cm last visit)  Width: 1.5 cm today (was 2.0 cm last visit) Depth: none  Tunneling: none  Granulation: 90%  Slough: 10%  Drainage (amount/description): scant  Dressing Used: xeroform  Dressing: 4x4; kerlix, coban  Sharps Used/Location: forceps  Tissue Remove: slough     Physical Therapy Assessment and Plan PT Assessment and Plan Clinical Impression Statement: Wound with continued approximation and granulation.  Debrided dry skin from periwound for continued approximation.  Moisturized Rt LE due to dryness/itching.  continued dressing with xeroform and compression. PT Plan: Continue current woundcare.     Problem List Patient Active Problem List   Diagnosis Date Noted  . Nonhealing nonsurgical wound 04/05/2013  . Stasis edema 04/05/2013          Lurena Nida, PTA/CLT 05/06/2013, 12:16 PM

## 2013-05-10 ENCOUNTER — Ambulatory Visit (HOSPITAL_COMMUNITY)
Admission: RE | Admit: 2013-05-10 | Discharge: 2013-05-10 | Disposition: A | Discharge status: Home / Self Care | Payer: Medicare Other | Source: Ambulatory Visit | Attending: Family Medicine | Admitting: Family Medicine

## 2013-05-10 NOTE — Progress Notes (Signed)
Physical Therapy Wound care Treatment Patient Details  Name: Connie Wood MRN: [id number] Date of Birth: 1940/08/25  Today's Date: 05/10/2013 Time: 4098-1191 PT Time Calculation (min): 23 min Visit#: 11 of 16  Re-eval: 05/30/13 Authorization: medicare; gcode updated on visit 8  Authorization Visit#: 11 of 18  Charges:  Deb <20cm  Subjective: Symptoms/Limitations Symptoms: Pt states the vaseline helped with decreasing the itch on her LE's.  Pt states her sciatica is bothering her so bad she was afraid to drive this morning.  Pt states she is going to Dr. Renard Matter today about her back.  Wound care:   Objective Wound 1:  Location: Medial Rt LE  Length: 3.4 cm (on 05/06/13) Width: 1.5 cm (on 05/06/13) Depth: none  Tunneling: none  Granulation: 95%  Slough: 5%  Drainage (amount/description): scant  Dressing Used: xeroform  Dressing: 4x4; kerlix, coban  Sharps Used/Location: forceps, scapel Tissue Remove: slough       Physical Therapy Assessment and Plan PT Assessment and Plan Clinical Impression Statement: Rt LE wound is requiring less debridement and continues to approximate.   Less dryness noted around periwound and lateral LE.  Continued using xeroform with kerlix/coban and #5 netting on LE.   PT Plan: Continue current woundcare.     Problem List Patient Active Problem List   Diagnosis Date Noted  . Nonhealing nonsurgical wound 04/05/2013  . Stasis edema 04/05/2013        Lurena Nida, PTA/CLT 05/10/2013, 11:23 AM

## 2013-05-11 ENCOUNTER — Ambulatory Visit (HOSPITAL_COMMUNITY): Payer: Medicare Other | Admitting: *Deleted

## 2013-05-12 ENCOUNTER — Ambulatory Visit (HOSPITAL_COMMUNITY): Payer: Self-pay

## 2013-05-12 ENCOUNTER — Other Ambulatory Visit (HOSPITAL_COMMUNITY): Payer: Self-pay | Admitting: Family Medicine

## 2013-05-12 ENCOUNTER — Ambulatory Visit (HOSPITAL_COMMUNITY)
Admission: RE | Admit: 2013-05-12 | Discharge: 2013-05-12 | Disposition: A | Discharge status: Home / Self Care | Payer: Medicare Other | Source: Ambulatory Visit | Attending: Family Medicine | Admitting: Family Medicine

## 2013-05-12 DIAGNOSIS — M79609 Pain in unspecified limb: Secondary | ICD-10-CM | POA: Insufficient documentation

## 2013-05-12 DIAGNOSIS — M545 Low back pain: Secondary | ICD-10-CM

## 2013-05-12 DIAGNOSIS — M51379 Other intervertebral disc degeneration, lumbosacral region without mention of lumbar back pain or lower extremity pain: Secondary | ICD-10-CM | POA: Insufficient documentation

## 2013-05-12 DIAGNOSIS — M5137 Other intervertebral disc degeneration, lumbosacral region: Secondary | ICD-10-CM | POA: Insufficient documentation

## 2013-05-12 DIAGNOSIS — M4120 Other idiopathic scoliosis, site unspecified: Secondary | ICD-10-CM | POA: Insufficient documentation

## 2013-05-12 DIAGNOSIS — G8929 Other chronic pain: Secondary | ICD-10-CM

## 2013-05-13 ENCOUNTER — Ambulatory Visit (HOSPITAL_COMMUNITY)
Admission: RE | Admit: 2013-05-13 | Discharge: 2013-05-13 | Disposition: A | Discharge status: Home / Self Care | Payer: Medicare Other | Source: Ambulatory Visit | Attending: Family Medicine | Admitting: Family Medicine

## 2013-05-13 NOTE — Progress Notes (Signed)
Physical Therapy Treatment Patient Details  Name: Connie Wood MRN: [id number] Date of Birth: [birth year]  Today's Date: 05/13/2013 Time: 1100-1130 PT Time Calculation (min): 30 min Charges: Selective debridement (= or < 20 cm)  Visit#: 12 of 16  Re-eval: 05/30/13  Authorization: medicare; gcode updated on visit 8  Authorization Visit#: 12 of 18   Subjective: Symptoms/Limitations Symptoms: Pt states that her wound is doing well but her sciatica continues to bother her. Pain Assessment Currently in Pain?: No/denies  Precautions/Restrictions    Wound care:  Objective  Wound 1:  Location: Medial Rt LE  Length: 3.4 cm (on 05/06/13)  Width: 1.5 cm (on 05/06/13)  Depth: none  Tunneling: none  Granulation: 96%  Slough: 4%  Drainage (amount/description): scant  Dressing Used: xeroform  Dressing: 4x4; kerlix, coban  Sharps Used/Location: forceps, scapel  Tissue Remove: slough    Physical Therapy Assessment and Plan PT Assessment and Plan Clinical Impression Statement: Wound continues to approximate well. Granulation is improving. Periwound appears healthier. Continued using xeroform with kerlix/coban and #5 netting on LE.  PT Plan: Continue wound care per PT POC.     Problem List Patient Active Problem List   Diagnosis Date Noted  . Nonhealing nonsurgical wound 04/05/2013  . Stasis edema 04/05/2013    Seth Bake, PTA 05/13/2013, 12:03 PM

## 2013-05-17 ENCOUNTER — Ambulatory Visit (HOSPITAL_COMMUNITY)
Admission: RE | Admit: 2013-05-17 | Discharge: 2013-05-17 | Disposition: A | Discharge status: Home / Self Care | Payer: Medicare Other | Source: Ambulatory Visit | Attending: Family Medicine | Admitting: Family Medicine

## 2013-05-17 NOTE — Progress Notes (Addendum)
Physical Therapy Treatment Patient Details  Name: Connie Wood MRN: [id number] Date of Birth: [birth year]  Today's Date: 05/17/2013 Time: 1100-1130 PT Time Calculation (min): 30 min Charges: Self care x 25'  Visit#: 13 of 16  Re-eval: 05/30/13  Authorization: medicare; gcode updated on visit 8  Authorization Visit#: 13 of 18   Subjective: Symptoms/Limitations Symptoms: Pt has no complaints regarding wound but continues to complain of sciatic pain. Pain Assessment Currently in Pain?: Yes Pain Score: 10-Worst pain ever (Pt states it is always 10/10) Pain Location: Hip Pain Orientation: Right Pain Type: Neuropathic pain Pain Radiating Towards: Throughout right LE  Wound care Location: Medial Rt LE  Length: 3.4 cm (on 05/06/13)  Width: 1.5 cm (on 05/06/13)  Depth: none  Tunneling: none  Granulation: 100%  Slough: 0% Drainage (amount/description): scant  Dressing Used: xeroform  Dressing: 4x4; kerlix, coban     Physical Therapy Assessment and Plan PT Assessment and Plan Clinical Impression Statement: Wound continues to progress well. Continued approximation noted. Wound appears to respond well to xeroform. Vaseline applied to perimeter to avoid maceration. Pt tolerates treatment well. No debridement needed, only cleansing and dressing change. PT Plan: Continue wound care per PT POC.     Problem List Patient Active Problem List   Diagnosis Date Noted  . Nonhealing nonsurgical wound 04/05/2013  . Stasis edema 04/05/2013    Seth Bake, PTA  05/17/2013, 12:10 PM

## 2013-05-19 ENCOUNTER — Ambulatory Visit (HOSPITAL_COMMUNITY)
Admission: RE | Admit: 2013-05-19 | Discharge: 2013-05-19 | Disposition: A | Discharge status: Home / Self Care | Payer: Medicare Other | Source: Ambulatory Visit | Attending: Family Medicine | Admitting: Family Medicine

## 2013-05-19 NOTE — Progress Notes (Signed)
Physical Therapy Treatment Patient Details  Name: Connie Wood MRN: [id number] Date of Birth: Sep 26, 1940  Today's Date: 05/19/2013 Time: 1016-1040 PT Time Calculation (min): 24 min Charges: Selective debridement (= or < 20 cm)  Visit#: 14 of 16  Re-eval: 05/30/13  Authorization: medicare; gcode updated on visit 8  Authorization Visit#: 14 of 18   Subjective: Symptoms/Limitations Symptoms: Pt states that she went to [facility] reguarding sciaitca this morning. She states that she was given a shot for pain.  Pain Assessment Currently in Pain?: Yes Pain Score: 10-Worst pain ever Pain Location: Hip Pain Orientation: Right Pain Type: Neuropathic pain Pain Radiating Towards: Throughout RLE  Wound care  Location: Medial Rt LE  Length: 3.4 cm (on 05/06/13)  Width: 1.5 cm (on 05/06/13)  Depth: none  Tunneling: none  Granulation: 100%  Slough: 0%  Drainage (amount/description): scant  Dressing Used: xeroform  Dressing: 4x4; kerlix, coban  Sharps Used/Location: forceps, scapel  Tissue Remove: slough     Physical Therapy Assessment and Plan PT Assessment and Plan Clinical Impression Statement: Wound continues to approximate well. Debridement needed this session. Granulation is 100% after debridement.  Continued with Vaseline at periwound and xeroform dressing. Pt tolerates debridement well. Wound is without signs or sx of infection. PT Plan: Continue wound care per PT POC.     Problem List Patient Active Problem List   Diagnosis Date Noted  . Nonhealing nonsurgical wound 04/05/2013  . Stasis edema 04/05/2013    PT - End of Session Activity Tolerance: Patient tolerated treatment well General Behavior During Therapy: [facility] for tasks assessed/performed  Seth Bake, PTA  05/19/2013, 10:47 AM

## 2013-05-25 ENCOUNTER — Ambulatory Visit (HOSPITAL_COMMUNITY)
Admission: RE | Admit: 2013-05-25 | Discharge: 2013-05-25 | Disposition: A | Discharge status: Home / Self Care | Payer: Medicare Other | Source: Ambulatory Visit | Attending: General Surgery | Admitting: General Surgery

## 2013-05-25 ENCOUNTER — Other Ambulatory Visit (HOSPITAL_COMMUNITY): Payer: Self-pay | Admitting: Family Medicine

## 2013-05-25 DIAGNOSIS — M545 Low back pain: Secondary | ICD-10-CM

## 2013-05-25 DIAGNOSIS — S81009A Unspecified open wound, unspecified knee, initial encounter: Secondary | ICD-10-CM | POA: Insufficient documentation

## 2013-05-25 DIAGNOSIS — IMO0001 Reserved for inherently not codable concepts without codable children: Secondary | ICD-10-CM | POA: Insufficient documentation

## 2013-05-25 NOTE — Progress Notes (Signed)
Physical Therapy Discharge Summary Patient Details  Name: DURINDA BUZZELLI MRN: [id number] Date of Birth: 1940/09/05  Today's Date: 05/25/2013 Time: 1015-1040 PT Time Calculation (min): 25 min Charges: Self care x 23'  Visit#: 15 of 16  Re-eval: 05/30/13  Authorization: medicare; gcode updated on visit 8  Authorization Time Period:    Authorization Visit#: 15 of 18   Subjective: Symptoms/Limitations Symptoms: Pt states that her sciatica continues to bother her. She has no complaint reguarding wound. Pain Assessment Currently in Pain?: Yes Pain Score: 10-Worst pain ever Pain Location: Hip Pain Orientation: Right Pain Radiating Towards: Throughout RLE  Wound care  Location: Medial Rt LE  Length: .5 cm (3.4 cm on 05/06/13)  Width: .5 cm (1.5 cm on 05/06/13)  Depth: none  Tunneling: none  Granulation: 100%  Slough: 0%  Drainage (amount/description): scant  Dressing Used: xeroform  Dressing: band-aid  Sharps Used/Location: forceps, scapel  Tissue Remove: none   Physical Therapy Assessment and Plan PT Assessment and Plan Clinical Impression Statement: Wound is 100% granulated. No debridement is necessary. Educated pt on how to care for wound at home. Suggested that she dress with xeroform and keep covered with Band-Aid except when showering. Daughter  was notified for discharge from wound care and educated on care for wound at home. Advised pt to watch wound for any changes. Pt is comfortable with D/C. PT Plan: Recommend D/C to [facility].     Problem List Patient Active Problem List   Diagnosis Date Noted  . Nonhealing nonsurgical wound 04/05/2013  . Stasis edema 04/05/2013    PT - End of Session Activity Tolerance: Patient tolerated treatment well General Behavior During Therapy: [facility] for tasks assessed/performed  GP Functional Assessment Tool Used: clinical judgement Functional Limitation: Other PT primary Other PT Primary Goal Status (H8469): At least 1 percent  but less than 20 percent impaired, limited or restricted Other PT Primary Discharge Status [phone]): At least 1 percent but less than 20 percent impaired, limited or restricted  Seth Bake, PTA  05/25/2013, 10:52 AM

## 2013-05-27 ENCOUNTER — Ambulatory Visit (HOSPITAL_COMMUNITY): Payer: BC Managed Care – PPO | Admitting: *Deleted

## 2013-05-28 ENCOUNTER — Ambulatory Visit (HOSPITAL_COMMUNITY)
Admission: RE | Admit: 2013-05-28 | Discharge: 2013-05-28 | Disposition: A | Discharge status: Home / Self Care | Payer: Medicare Other | Source: Ambulatory Visit | Attending: Family Medicine | Admitting: Family Medicine

## 2013-05-28 DIAGNOSIS — M5137 Other intervertebral disc degeneration, lumbosacral region: Secondary | ICD-10-CM | POA: Insufficient documentation

## 2013-05-28 DIAGNOSIS — M51379 Other intervertebral disc degeneration, lumbosacral region without mention of lumbar back pain or lower extremity pain: Secondary | ICD-10-CM | POA: Insufficient documentation

## 2013-05-28 DIAGNOSIS — M25559 Pain in unspecified hip: Secondary | ICD-10-CM | POA: Insufficient documentation

## 2013-05-28 DIAGNOSIS — M545 Low back pain, unspecified: Secondary | ICD-10-CM | POA: Insufficient documentation

## 2013-05-28 DIAGNOSIS — M4120 Other idiopathic scoliosis, site unspecified: Secondary | ICD-10-CM | POA: Insufficient documentation

## 2013-05-28 DIAGNOSIS — M79609 Pain in unspecified limb: Secondary | ICD-10-CM | POA: Insufficient documentation

## 2013-05-28 DIAGNOSIS — M48061 Spinal stenosis, lumbar region without neurogenic claudication: Secondary | ICD-10-CM | POA: Insufficient documentation

## 2013-06-01 ENCOUNTER — Ambulatory Visit (HOSPITAL_COMMUNITY): Payer: BC Managed Care – PPO | Admitting: Physical Therapy

## 2013-06-03 ENCOUNTER — Ambulatory Visit (HOSPITAL_COMMUNITY): Payer: BC Managed Care – PPO | Admitting: *Deleted

## 2013-06-08 ENCOUNTER — Ambulatory Visit (HOSPITAL_COMMUNITY): Payer: BC Managed Care – PPO | Admitting: *Deleted

## 2013-06-10 ENCOUNTER — Ambulatory Visit (HOSPITAL_COMMUNITY): Payer: BC Managed Care – PPO | Admitting: *Deleted

## 2013-07-11 ENCOUNTER — Inpatient Hospital Stay (HOSPITAL_COMMUNITY)
Admission: EM | Admit: 2013-07-11 | Discharge: 2013-07-12 | DRG: 190 | Disposition: A | Discharge status: Home / Self Care | Payer: Medicare Other | Attending: Family Medicine | Admitting: Family Medicine

## 2013-07-11 ENCOUNTER — Emergency Department (HOSPITAL_COMMUNITY): Payer: Medicare Other

## 2013-07-11 ENCOUNTER — Encounter (HOSPITAL_COMMUNITY): Payer: Self-pay | Admitting: Emergency Medicine

## 2013-07-11 DIAGNOSIS — J40 Bronchitis, not specified as acute or chronic: Secondary | ICD-10-CM

## 2013-07-11 DIAGNOSIS — Z8673 Personal history of transient ischemic attack (TIA), and cerebral infarction without residual deficits: Secondary | ICD-10-CM

## 2013-07-11 DIAGNOSIS — E7800 Pure hypercholesterolemia, unspecified: Secondary | ICD-10-CM | POA: Diagnosis present

## 2013-07-11 DIAGNOSIS — F17200 Nicotine dependence, unspecified, uncomplicated: Secondary | ICD-10-CM | POA: Diagnosis present

## 2013-07-11 DIAGNOSIS — R0902 Hypoxemia: Secondary | ICD-10-CM

## 2013-07-11 DIAGNOSIS — J440 Chronic obstructive pulmonary disease with acute lower respiratory infection: Principal | ICD-10-CM | POA: Diagnosis present

## 2013-07-11 DIAGNOSIS — R0600 Dyspnea, unspecified: Secondary | ICD-10-CM

## 2013-07-11 DIAGNOSIS — IMO0002 Reserved for concepts with insufficient information to code with codable children: Secondary | ICD-10-CM | POA: Diagnosis present

## 2013-07-11 DIAGNOSIS — I10 Essential (primary) hypertension: Secondary | ICD-10-CM | POA: Diagnosis present

## 2013-07-11 DIAGNOSIS — J209 Acute bronchitis, unspecified: Principal | ICD-10-CM | POA: Diagnosis present

## 2013-07-11 DIAGNOSIS — J9620 Acute and chronic respiratory failure, unspecified whether with hypoxia or hypercapnia: Secondary | ICD-10-CM | POA: Diagnosis present

## 2013-07-11 DIAGNOSIS — J42 Unspecified chronic bronchitis: Secondary | ICD-10-CM | POA: Diagnosis present

## 2013-07-11 DIAGNOSIS — Z8701 Personal history of pneumonia (recurrent): Secondary | ICD-10-CM

## 2013-07-11 LAB — CBC WITH DIFFERENTIAL/PLATELET
Basophils Absolute: 0 10*3/uL (ref 0.0–0.1)
Basophils Relative: 0 % (ref 0–1)
Eosinophils Absolute: 0.1 K/uL (ref 0.0–0.7)
Eosinophils Relative: 2 % (ref 0–5)
HCT: 34.4 % — ABNORMAL LOW (ref 36.0–46.0)
Hemoglobin: 11.9 g/dL — ABNORMAL LOW (ref 12.0–15.0)
Lymphocytes Relative: 19 % (ref 12–46)
Lymphs Abs: 1.4 10*3/uL (ref 0.7–4.0)
MCH: 32.9 pg (ref 26.0–34.0)
MCHC: 34.6 g/dL (ref 30.0–36.0)
MCV: 95 fL (ref 78.0–100.0)
Monocytes Absolute: 0.8 10*3/uL (ref 0.1–1.0)
Monocytes Relative: 10 % (ref 3–12)
Neutro Abs: 5.1 K/uL (ref 1.7–7.7)
Neutrophils Relative %: 68 % (ref 43–77)
Platelets: 204 K/uL (ref 150–400)
RBC: 3.62 MIL/uL — ABNORMAL LOW (ref 3.87–5.11)
RDW: 13.1 % (ref 11.5–15.5)
WBC: 7.5 10*3/uL (ref 4.0–10.5)

## 2013-07-11 LAB — BASIC METABOLIC PANEL
BUN: 12 mg/dL (ref 6–23)
CO2: 30 mEq/L (ref 19–32)
Chloride: 98 mEq/L (ref 96–112)
Creatinine, Ser: 0.78 mg/dL (ref 0.50–1.10)
Glucose, Bld: 118 mg/dL — ABNORMAL HIGH (ref 70–99)

## 2013-07-11 LAB — URINALYSIS, ROUTINE W REFLEX MICROSCOPIC
Bilirubin Urine: NEGATIVE
Glucose, UA: NEGATIVE mg/dL
KETONES UR: NEGATIVE mg/dL
LEUKOCYTES UA: NEGATIVE
NITRITE: NEGATIVE
PROTEIN: 100 mg/dL — AB
Specific Gravity, Urine: 1.025 (ref 1.005–1.030)
UROBILINOGEN UA: 0.2 mg/dL (ref 0.0–1.0)
pH: 6 (ref 5.0–8.0)

## 2013-07-11 LAB — BASIC METABOLIC PANEL WITH GFR
Calcium: 8.8 mg/dL (ref 8.4–10.5)
GFR calc Af Amer: >90 mL/min (ref >90)
GFR calc non Af Amer: 82 mL/min — ABNORMAL LOW (ref >90)
Potassium: 4 meq/L (ref 3.7–5.3)
Sodium: 138 meq/L (ref 137–147)

## 2013-07-11 LAB — URINE MICROSCOPIC-ADD ON

## 2013-07-11 LAB — TROPONIN I: Troponin I: <0.3 ng/mL (ref <0.30)

## 2013-07-11 LAB — PRO B NATRIURETIC PEPTIDE: Pro B Natriuretic peptide (BNP): 889.5 pg/mL — ABNORMAL HIGH (ref 0–125)

## 2013-07-11 MED ORDER — ENOXAPARIN SODIUM 40 MG/0.4ML ~~LOC~~ SOLN
40.0000 mg | SUBCUTANEOUS | Status: DC
  Administered 2013-07-12: 40 mg via SUBCUTANEOUS
  Filled 2013-07-11: qty 0.4

## 2013-07-11 MED ORDER — IPRATROPIUM-ALBUTEROL 0.5-2.5 (3) MG/3ML IN SOLN
3.0000 mL | RESPIRATORY_TRACT | Status: DC
  Administered 2013-07-12 (×5): 3 mL via RESPIRATORY_TRACT
  Filled 2013-07-11 (×5): qty 3

## 2013-07-11 MED ORDER — FUROSEMIDE 10 MG/ML IJ SOLN: 20.0000 mg | Freq: Two times a day (BID) | INTRAMUSCULAR | Status: DC

## 2013-07-11 MED ORDER — SODIUM CHLORIDE 0.9 % IV SOLN: 250.0000 mL | INTRAVENOUS | Status: DC | PRN

## 2013-07-11 MED ORDER — ALBUTEROL SULFATE (2.5 MG/3ML) 0.083% IN NEBU
5.0000 mg | INHALATION_SOLUTION | Freq: Once | RESPIRATORY_TRACT | Status: AC
  Administered 2013-07-11: 5 mg via RESPIRATORY_TRACT
  Filled 2013-07-11: qty 6

## 2013-07-11 MED ORDER — FUROSEMIDE 10 MG/ML IJ SOLN
INTRAMUSCULAR | Status: AC
  Administered 2013-07-11: 20 mg
  Filled 2013-07-11: qty 2

## 2013-07-11 MED ORDER — ACETAMINOPHEN 650 MG RE SUPP: 650.0000 mg | Freq: Four times a day (QID) | RECTAL | Status: DC | PRN

## 2013-07-11 MED ORDER — METHYLPREDNISOLONE SODIUM SUCC 125 MG IJ SOLR
125.0000 mg | Freq: Once | INTRAMUSCULAR | Status: AC
  Administered 2013-07-11: 125 mg via INTRAVENOUS
  Filled 2013-07-11: qty 2

## 2013-07-11 MED ORDER — ALBUTEROL SULFATE (2.5 MG/3ML) 0.083% IN NEBU
2.5000 mg | INHALATION_SOLUTION | RESPIRATORY_TRACT | Status: DC
Start: 2013-07-12 — End: 2013-07-12

## 2013-07-11 MED ORDER — ONDANSETRON HCL 4 MG PO TABS: 4.0000 mg | ORAL_TABLET | Freq: Four times a day (QID) | ORAL | Status: DC | PRN

## 2013-07-11 MED ORDER — SODIUM CHLORIDE 0.9 % IJ SOLN
3.0000 mL | Freq: Two times a day (BID) | INTRAMUSCULAR | Status: DC
  Administered 2013-07-11 – 2013-07-12 (×2): 3 mL via INTRAVENOUS

## 2013-07-11 MED ORDER — SODIUM CHLORIDE 0.9 % IJ SOLN: 3.0000 mL | INTRAMUSCULAR | Status: DC | PRN

## 2013-07-11 MED ORDER — ACETAMINOPHEN 325 MG PO TABS: 650.0000 mg | ORAL_TABLET | Freq: Four times a day (QID) | ORAL | Status: DC | PRN

## 2013-07-11 MED ORDER — VENLAFAXINE HCL ER 75 MG PO CP24
150.0000 mg | ORAL_CAPSULE | Freq: Every day | ORAL | Status: DC
  Administered 2013-07-12: 150 mg via ORAL
  Filled 2013-07-11: qty 2

## 2013-07-11 MED ORDER — ONDANSETRON HCL 4 MG/2ML IJ SOLN: 4.0000 mg | Freq: Four times a day (QID) | INTRAMUSCULAR | Status: DC | PRN

## 2013-07-11 MED ORDER — AMLODIPINE-OLMESARTAN 10-40 MG PO TABS: 1.0000 | ORAL_TABLET | Freq: Every day | ORAL | Status: DC

## 2013-07-11 MED ORDER — METHYLPREDNISOLONE SODIUM SUCC 125 MG IJ SOLR
60.0000 mg | Freq: Four times a day (QID) | INTRAMUSCULAR | Status: DC
  Administered 2013-07-12 (×3): 60 mg via INTRAVENOUS
  Filled 2013-07-11 (×3): qty 2

## 2013-07-11 MED ORDER — IRBESARTAN 300 MG PO TABS
300.0000 mg | ORAL_TABLET | Freq: Every day | ORAL | Status: DC
  Administered 2013-07-12: 300 mg via ORAL
  Filled 2013-07-11: qty 1

## 2013-07-11 MED ORDER — AMLODIPINE BESYLATE 5 MG PO TABS
10.0000 mg | ORAL_TABLET | Freq: Every day | ORAL | Status: DC
  Administered 2013-07-12: 10 mg via ORAL
  Filled 2013-07-11: qty 2

## 2013-07-11 MED ORDER — CLORAZEPATE DIPOTASSIUM 7.5 MG PO TABS
15.0000 mg | ORAL_TABLET | Freq: Every day | ORAL | Status: DC
  Administered 2013-07-12: 15 mg via ORAL
  Filled 2013-07-11: qty 2

## 2013-07-11 MED ORDER — SODIUM CHLORIDE 0.9 % IV SOLN
INTRAVENOUS | Status: AC
  Administered 2013-07-12: via INTRAVENOUS

## 2013-07-11 MED ORDER — IPRATROPIUM BROMIDE 0.02 % IN SOLN
0.5000 mg | Freq: Once | RESPIRATORY_TRACT | Status: AC
  Administered 2013-07-11: 0.5 mg via RESPIRATORY_TRACT
  Filled 2013-07-11: qty 2.5

## 2013-07-11 NOTE — ED Notes (Signed)
Pt completed Tamiful yesterday. SOB x 2 days. Daughter states she is "talking out of her head" at times. RA O2 of 85%.

## 2013-07-11 NOTE — ED Notes (Signed)
Ambulated on r/a, sats dropped to 85% and pt feels SOB

## 2013-07-11 NOTE — H&P (Signed)
PCP:   Lanette Hampshire, MD   Chief Complaint:  Shortness of breath  HPI: 73 year old female who   has a past medical history of degenerative disc disease; Hypertension; Hypercholesterolemia; and Stroke. Today presented to the ED with worsening shortness of breath patient's O2 sats dropped to 85% on walking. She was recently diagnosed with influenza and received Tamiflu but patient says that she did not feel better and continued to have shortness of breath. She denies chest pain but admits to having cough. Patient is a smoker and smokes everyday. She denies nausea vomiting or diarrhea. Patient also admits to having dyspnea on exertion. In the ED patient received DuoNeb nebulizers but continues to have hypoxia on exertion BNP is elevated to 889.5 First set of troponin is negative  Allergies:  No Known Allergies    Past Medical History  Diagnosis Date  . Hx of degenerative disc disease   . Hypertension   . Hypercholesterolemia   . Stroke     Past Surgical History  Procedure Laterality Date  . Cholecystectomy    . Abdominal hysterectomy    . Breast surgery    . Appendectomy    . Tonsillectomy      Prior to Admission medications   Medication Sig Start Date End Date Taking? Authorizing Provider  AZOR 10-40 MG per tablet Take 1 tablet by mouth daily. 07/06/13  Yes Historical Provider, MD  butalbital-acetaminophen-caffeine (FIORICET WITH CODEINE) 50-325-40-30 MG per capsule Take 1 capsule by mouth every 6 (six) hours as needed. headaches 07/02/13  Yes Historical Provider, MD  clorazepate (TRANXENE) 15 MG tablet Take 15 mg by mouth daily. 07/01/13  Yes Historical Provider, MD  esomeprazole (NEXIUM) 40 MG capsule Take 1 capsule (40 mg total) by mouth daily. 10/28/12  Yes Maudry Diego, MD  venlafaxine XR (EFFEXOR-XR) 150 MG 24 hr capsule Take 150 mg by mouth daily. 07/03/13  Yes Historical Provider, MD  lactulose (CHRONULAC) 10 GM/15ML solution TAKE 1 TABLESPOONFUL DAILY FOR 4 WEEKS 02/06/13    Jonnie Kind, MD  ondansetron (ZOFRAN ODT) 4 MG disintegrating tablet Take one every 4-6 hours for nauseau 10/28/12   Maudry Diego, MD    Social History:  reports that she has been smoking Cigarettes.  She has been smoking about 0.00 packs per day. She does not have any smokeless tobacco history on file. She reports that she does not drink alcohol or use illicit drugs.  History reviewed. No pertinent family history.   All the positives are listed in BOLD  Review of Systems:  HEENT: Headache, blurred vision, runny nose, sore throat Neck: Hypothyroidism, hyperthyroidism,,lymphadenopathy Chest : Shortness of breath, history of COPD, Asthma Heart : Chest pain, history of coronary arterey disease GI:  Nausea, vomiting, diarrhea, constipation, GERD GU: Dysuria, urgency, frequency of urination, hematuria Neuro: Stroke, seizures, syncope Psych: Depression, anxiety, hallucinations   Physical Exam: Blood pressure 158/87, pulse 86, temperature 99 F (37.2 C), temperature source Oral, resp. rate 15, weight 64.456 kg (142 lb 1.6 oz), SpO2 96.00%. Constitutional:   Patient is a well-developed and well-nourished female in no acute distress and cooperative with exam. Head: Normocephalic and atraumatic Mouth: Mucus membranes moist Eyes: PERRL, EOMI, conjunctivae normal Neck: Supple, No Thyromegaly Cardiovascular: RRR, S1 normal, S2 normal Pulmonary/Chest: Bilateral rhonchi Abdominal: Soft. Non-tender, non-distended, bowel sounds are normal, no masses, organomegaly, or guarding present.  Neurological: A&O x3, Strenght is normal and symmetric bilaterally, cranial nerve II-XII are grossly intact, no focal motor deficit, sensory intact to light touch  bilaterally.  Extremities : Trace edema of the lower extremities   Labs on Admission:  Results for orders placed during the hospital encounter of 07/11/13 (from the past 48 hour(s))  URINALYSIS, ROUTINE W REFLEX MICROSCOPIC     Status: Abnormal    Collection Time    07/11/13  7:11 PM      Result Value Range   Color, Urine YELLOW  YELLOW   APPearance HAZY (*) CLEAR   Specific Gravity, Urine 1.025  1.005 - 1.030   pH 6.0  5.0 - 8.0   Glucose, UA NEGATIVE  NEGATIVE mg/dL   Hgb urine dipstick SMALL (*) NEGATIVE   Bilirubin Urine NEGATIVE  NEGATIVE   Ketones, ur NEGATIVE  NEGATIVE mg/dL   Protein, ur 100 (*) NEGATIVE mg/dL   Urobilinogen, UA 0.2  0.0 - 1.0 mg/dL   Nitrite NEGATIVE  NEGATIVE   Leukocytes, UA NEGATIVE  NEGATIVE  URINE MICROSCOPIC-ADD ON     Status: None   Collection Time    07/11/13  7:11 PM      Result Value Range   Squamous Epithelial / LPF RARE  RARE   WBC, UA 0-2  <3 WBC/hpf   RBC / HPF 3-6  <3 RBC/hpf  CBC WITH DIFFERENTIAL     Status: Abnormal   Collection Time    07/11/13  7:30 PM      Result Value Range   WBC 7.5  4.0 - 10.5 K/uL   RBC 3.62 (*) 3.87 - 5.11 MIL/uL   Hemoglobin 11.9 (*) 12.0 - 15.0 g/dL   HCT 34.4 (*) 36.0 - 46.0 %   MCV 95.0  78.0 - 100.0 fL   MCH 32.9  26.0 - 34.0 pg   MCHC 34.6  30.0 - 36.0 g/dL   RDW 13.1  11.5 - 15.5 %   Platelets 204  150 - 400 K/uL   Neutrophils Relative % 68  43 - 77 %   Neutro Abs 5.1  1.7 - 7.7 K/uL   Lymphocytes Relative 19  12 - 46 %   Lymphs Abs 1.4  0.7 - 4.0 K/uL   Monocytes Relative 10  3 - 12 %   Monocytes Absolute 0.8  0.1 - 1.0 K/uL   Eosinophils Relative 2  0 - 5 %   Eosinophils Absolute 0.1  0.0 - 0.7 K/uL   Basophils Relative 0  0 - 1 %   Basophils Absolute 0.0  0.0 - 0.1 K/uL  PRO B NATRIURETIC PEPTIDE     Status: Abnormal   Collection Time    07/11/13  7:30 PM      Result Value Range   Pro B Natriuretic peptide (BNP) 889.5 (*) 0 - 125 pg/mL  BASIC METABOLIC PANEL     Status: Abnormal   Collection Time    07/11/13  7:30 PM      Result Value Range   Sodium 138  137 - 147 mEq/L   Potassium 4.0  3.7 - 5.3 mEq/L   Chloride 98  96 - 112 mEq/L   CO2 30  19 - 32 mEq/L   Glucose, Bld 118 (*) 70 - 99 mg/dL   BUN 12  6 - 23 mg/dL    Creatinine, Ser 0.78  0.50 - 1.10 mg/dL   Calcium 8.8  8.4 - 10.5 mg/dL   GFR calc non Af Amer 82 (*) >90 mL/min   GFR calc Af Amer >90  >90 mL/min   Comment: (NOTE)     The eGFR  has been calculated using the CKD EPI equation.     This calculation has not been validated in all clinical situations.     eGFR's persistently <90 mL/min signify possible Chronic Kidney     Disease.  TROPONIN I     Status: None   Collection Time    07/11/13  7:30 PM      Result Value Range   Troponin I <0.30  <0.30 ng/mL   Comment:            Due to the release kinetics of cTnI,     a negative result within the first hours     of the onset of symptoms does not rule out     myocardial infarction with certainty.     If myocardial infarction is still suspected,     repeat the test at appropriate intervals.    Radiological Exams on Admission: Dg Chest 2 View  07/11/2013   CLINICAL DATA:  Cough and congestion.  Fever.  EXAM: CHEST  2 VIEW  COMPARISON:  Two-view chest 02/25/2012.  FINDINGS: Heart size is normal. Emphysematous changes are again noted. Chronic interstitial coarsening is stable. No focal airspace disease is evident.  IMPRESSION: 1. No acute cardiopulmonary disease. 2. Stable emphysematous changes.   Electronically Signed   By: Lawrence Santiago M.D.   On: 07/11/2013 19:22    Assessment/Plan Active Problems:   Bronchitis   Acute bronchitis  Acute bronchitis Patient may have underlying COPD, will start her on Solu-Medrol, DuoNeb nebulizers every 6 hours, Levaquin per pharmacy consultation.  Elevated BNP Patient has history of hypertension and has a mild elevation of BNP, she may have some element of congestive heart failure continued to symptoms as she has not improved with nebulizer treatments. We'll obtain 2-D echocardiogram and give HER 2 doses of Lasix 20 mg IV every 12 hours. After checking the echocardiogram further adjustment in diuretics if required will be done by the attending  physician.  Hypertension Continue his old 10/40 mg by mouth daily  DVT prophylaxis Lovenox  Code status: Patient is full code  Family discussion: Discussed with patient's daughter at bedside   Time Spent on Admission: 60 minutes  [facility] Pager: [phone] 07/11/2013, 10:10 PM  If 7PM-7AM, please contact night-coverage  [email or web address]  Password [email or web address]

## 2013-07-11 NOTE — ED Notes (Signed)
Family at bedside. Assisted patient to restroom to urinate. Obtained specimen.

## 2013-07-11 NOTE — ED Provider Notes (Signed)
CSN: [id number]     Arrival date & time 07/11/13  1836 History  This chart was scribed for Hardwick Dorna Mai, MD by Maree Erie, ED Scribe. The patient was seen in room [facility]05/[facility]. Patient's care was started at 9:42 PM.     Chief Complaint  Patient presents with  . Shortness of Breath   Patient is a 73 y.o. female presenting with shortness of breath. The history is provided by the patient and a relative. No language interpreter was used.  Shortness of Breath Associated symptoms: cough and fever   Associated symptoms: no abdominal pain, no chest pain, no rash and no vomiting     HPI Comments: Connie Wood is a 73 y.o. female who presents to the Emergency Department complaining of intermittent shortness of breath that has been ongoing for about a week. The shortness of breath is exacerbated by exertion. She was diagnosed with the flu after testing positive by her PCP, Dr. Everette Rank, six days ago. She was prescribed Tamiflu and has been taking it compliantly. She finished the medication yesterday but denies complete relief of both symptoms. She is still complaining of subjective fever, decreased appetite and non-productive, wet cough but denies myalgias. She also denies chest pain, diarrhea or vomiting. She reports having a history of past pneumonia infections. She is not on oxygen at baseline. She denies a history of heart of lung conditions, including asthma, COPD, emphysema, MI, CAD, or CHF. She is a current smoker but has not been smoking during this episode of sickness.    Past Medical History  Diagnosis Date  . Hx of degenerative disc disease   . Hypertension   . Hypercholesterolemia   . Stroke    Past Surgical History  Procedure Laterality Date  . Cholecystectomy    . Abdominal hysterectomy    . Breast surgery    . Appendectomy    . Tonsillectomy     History reviewed. No pertinent family history. History  Substance Use Topics  . Smoking status: Current Every Day Smoker     Types: Cigarettes  . Smokeless tobacco: Not on file  . Alcohol Use: No   OB History   Grav Para Term Preterm Abortions TAB SAB Ect Mult Living                 Review of Systems  Constitutional: Positive for fever, appetite change and fatigue. Negative for unexpected weight change.  HENT: Negative for congestion.   Respiratory: Positive for cough and shortness of breath.   Cardiovascular: Negative for chest pain and leg swelling.  Gastrointestinal: Negative for nausea, vomiting, abdominal pain and diarrhea.  Musculoskeletal: Negative for myalgias.  Skin: Negative for rash.  Neurological: Positive for weakness. Negative for syncope and light-headedness.  All other systems reviewed and are negative.    Allergies  Review of patient's allergies indicates no known allergies.  Home Medications   Current Outpatient Rx  Name  Route  Sig  Dispense  Refill  . AZOR 10-40 MG per tablet   Oral   Take 1 tablet by mouth daily.         . butalbital-acetaminophen-caffeine (FIORICET WITH CODEINE) 50-325-40-30 MG per capsule   Oral   Take 1 capsule by mouth every 6 (six) hours as needed. headaches         . clorazepate (TRANXENE) 15 MG tablet   Oral   Take 15 mg by mouth daily.         Marland Kitchen esomeprazole (NEXIUM) 40 MG  capsule   Oral   Take 1 capsule (40 mg total) by mouth daily.   20 capsule   0   . venlafaxine XR (EFFEXOR-XR) 150 MG 24 hr capsule   Oral   Take 150 mg by mouth daily.         Marland Kitchen lactulose (CHRONULAC) 10 GM/15ML solution      TAKE 1 TABLESPOONFUL DAILY FOR 4 WEEKS   450 mL   11   . ondansetron (ZOFRAN ODT) 4 MG disintegrating tablet      Take one every 4-6 hours for nauseau   12 tablet   0    Triage Vitals: BP 174/92  Pulse 109  Temp(Src) 99 F (37.2 C) (Oral)  Resp 20  Wt 142 lb 1.6 oz (64.456 kg)  SpO2 85%  Physical Exam  Nursing note and vitals reviewed. Constitutional: She is oriented to person, place, and time. She appears  well-developed and well-nourished. No distress.  HENT:  Head: Normocephalic and atraumatic.  Mouth/Throat: Mucous membranes are dry.  Eyes: Conjunctivae and EOM are normal. No scleral icterus.  Neck: Neck supple. No JVD present. No tracheal deviation present.  Cardiovascular: Normal rate, regular rhythm and intact distal pulses.   No murmur heard. Pulmonary/Chest: Effort normal. No respiratory distress. She has wheezes.  Expiratory wheezing in right lobe and at base of left lobe.  Abdominal: Soft. There is no tenderness. There is no rebound.  Musculoskeletal: Normal range of motion. She exhibits no edema.  No peripheral edema.  Neurological: She is alert and oriented to person, place, and time.  Skin: Skin is warm and dry. She is not diaphoretic.  Psychiatric: She has a normal mood and affect. Her behavior is normal.    ED Course  Procedures (including critical care time)  DIAGNOSTIC STUDIES: Oxygen Saturation is 85% on room air, hypoxic by my interpretation.    COORDINATION OF CARE: 7:00 PM -Will order chest x-ray. Patient verbalizes understanding and agrees with treatment plan.  9:10 PM -Upon recheck, patient states her breathing feels improved. Will remove patient from oxygen and have her walk to see if oxygen saturation drops or remains stable. Patient verbalizes understanding and agrees with treatment plan.  9:23 PM -Patient remained hypoxic while walking on room air. Will admit patient. Patient verbalizes understanding and agrees with treatment plan.    Labs Review Labs Reviewed  CBC WITH DIFFERENTIAL - Abnormal; Notable for the following:    RBC 3.62 (*)    Hemoglobin 11.9 (*)    HCT 34.4 (*)    All other components within normal limits  PRO B NATRIURETIC PEPTIDE - Abnormal; Notable for the following:    Pro B Natriuretic peptide (BNP) 889.5 (*)    All other components within normal limits  BASIC METABOLIC PANEL - Abnormal; Notable for the following:    Glucose, Bld  118 (*)    GFR calc non Af Amer 82 (*)    All other components within normal limits  URINALYSIS, ROUTINE W REFLEX MICROSCOPIC - Abnormal; Notable for the following:    APPearance HAZY (*)    Hgb urine dipstick SMALL (*)    Protein, ur 100 (*)    All other components within normal limits  TROPONIN I  URINE MICROSCOPIC-ADD ON   Imaging Review Dg Chest 2 View  07/11/2013   CLINICAL DATA:  Cough and congestion.  Fever.  EXAM: CHEST  2 VIEW  COMPARISON:  Two-view chest 02/25/2012.  FINDINGS: Heart size is normal. Emphysematous changes are again  noted. Chronic interstitial coarsening is stable. No focal airspace disease is evident.  IMPRESSION: 1. No acute cardiopulmonary disease. 2. Stable emphysematous changes.   Electronically Signed   By: Lawrence Santiago M.D.   On: 07/11/2013 19:22    EKG Interpretation    Date/Time:  Sunday July 11 2013 20:03:52 EST Ventricular Rate:  87 PR Interval:  132 QRS Duration: 82 QT Interval:  392 QTC Calculation: 471 R Axis:   54 Text Interpretation:  Normal sinus rhythm Possible Left atrial enlargement Borderline ECG When compared with ECG of 28-Oct-2012 18:37, No significant change was found Confirmed by [facility]  MD, MICHEAL ([id number]) on 07/11/2013 9:04:58 PM            MDM  No diagnosis found. I personally performed the services described in this documentation, which was scribed in my presence. The recorded information has been reviewed and considered.    Pt with recently diagnosed flu, not improving, weak, more SOB esp with exertion.  O2 sats low on presentation, not usually on O2.  Pt smokes however.  Decreased appetite, denies sig CP, just some tightness.  No N/V/D.    Saddie Benders. Dorna Mai, MD 07/11/13 2142

## 2013-07-12 DIAGNOSIS — I517 Cardiomegaly: Secondary | ICD-10-CM

## 2013-07-12 DIAGNOSIS — R0989 Other specified symptoms and signs involving the circulatory and respiratory systems: Secondary | ICD-10-CM

## 2013-07-12 DIAGNOSIS — R0600 Dyspnea, unspecified: Secondary | ICD-10-CM

## 2013-07-12 DIAGNOSIS — R0609 Other forms of dyspnea: Secondary | ICD-10-CM

## 2013-07-12 LAB — COMPREHENSIVE METABOLIC PANEL
ALBUMIN: 3 g/dL — AB (ref 3.5–5.2)
ALK PHOS: 262 U/L — AB (ref 39–117)
ALT: 19 U/L (ref 0–35)
AST: 32 U/L (ref 0–37)
BUN: 15 mg/dL (ref 6–23)
CO2: 28 mEq/L (ref 19–32)
Calcium: 9.2 mg/dL (ref 8.4–10.5)
Chloride: 99 mEq/L (ref 96–112)
Creatinine, Ser: 0.73 mg/dL (ref 0.50–1.10)
GFR calc Af Amer: >90 mL/min (ref >90)
GFR calc non Af Amer: 83 mL/min — ABNORMAL LOW (ref >90)
Glucose, Bld: 183 mg/dL — ABNORMAL HIGH (ref 70–99)
POTASSIUM: 3.9 meq/L (ref 3.7–5.3)
SODIUM: 140 meq/L (ref 137–147)
Total Bilirubin: 0.2 mg/dL — ABNORMAL LOW (ref 0.3–1.2)
Total Protein: 7.4 g/dL (ref 6.0–8.3)

## 2013-07-12 LAB — CBC
HCT: 38.1 % (ref 36.0–46.0)
Hemoglobin: 12.3 g/dL (ref 12.0–15.0)
MCH: 31.2 pg (ref 26.0–34.0)
MCHC: 32.3 g/dL (ref 30.0–36.0)
MCV: 96.7 fL (ref 78.0–100.0)
Platelets: 214 10*3/uL (ref 150–400)
RBC: 3.94 MIL/uL (ref 3.87–5.11)
RDW: 12.4 % (ref 11.5–15.5)
WBC: 4.2 10*3/uL (ref 4.0–10.5)

## 2013-07-12 LAB — TROPONIN I
Troponin I: <0.3 ng/mL (ref <0.30)
Troponin I: <0.3 ng/mL (ref <0.30)

## 2013-07-12 MED ORDER — DILTIAZEM HCL 60 MG PO TABS
60.0000 mg | ORAL_TABLET | Freq: Two times a day (BID) | ORAL | Status: DC
  Administered 2013-07-12: 60 mg via ORAL
  Filled 2013-07-12: qty 1

## 2013-07-12 MED ORDER — LEVOFLOXACIN IN D5W 750 MG/150ML IV SOLN
750.0000 mg | Freq: Once | INTRAVENOUS | Status: AC
  Administered 2013-07-12: 750 mg via INTRAVENOUS
  Filled 2013-07-12: qty 150

## 2013-07-12 MED ORDER — LEVOFLOXACIN IN D5W 750 MG/150ML IV SOLN
750.0000 mg | INTRAVENOUS | Status: DC
  Filled 2013-07-12: qty 150

## 2013-07-12 MED ORDER — LEVOFLOXACIN IN D5W 750 MG/150ML IV SOLN
INTRAVENOUS | Status: AC
  Filled 2013-07-12: qty 150

## 2013-07-12 MED ORDER — LEVOFLOXACIN 750 MG PO TABS: 750.0000 mg | ORAL_TABLET | Freq: Every day | ORAL | Status: DC

## 2013-07-12 NOTE — Progress Notes (Signed)
Connie Wood, Connie Wood         ACCOUNT NO.:  [id number]  MEDICAL RECORD NO.:  [id number]  LOCATION:  J570                          FACILITY:  [facility]  PHYSICIAN:  Anniyah Mood G. Everette Rank, MD   DATE OF BIRTH:  Jun 28, 1940  DATE OF PROCEDURE: DATE OF DISCHARGE:                                PROGRESS NOTE   This patient is alert and oriented this morning.  She recently had influenza tested positive for flu and had been on Tamiflu 75 mg daily. She had developed increasing shortness of breath.  Apparently, O2 sats dropped to 85%.  BNP was elevated at 889.5.  She was admitted with acute bronchitis, possible COPD, and element of congestive heart failure.  OBJECTIVE:  VITAL SIGNS:  Blood pressure 142/75, respirations 18, pulse 85, temp 97.4. HEENT:  Eyes, PERRLA.  TM negative.  Oropharynx benign. NECK:  Supple.  No JVD or thyroid abnormalities. HEART:  Regular rhythm.  No murmurs. LUNGS:  Clear to P and A. ABDOMEN:  No palpable organs or masses.  No organomegaly. EXTREMITIES:  Free of edema.  The patient does have kyphotic spine.  ASSESSMENT: 1. Recent influenza bronchitis, possible chronic obstructive pulmonary     disease. 2. Elevated BNP and hypertension.  PLAN:  To continue Lasix 20 mg daily, nasal O2, and neb treatments.  We will repeat chemistries and obtain Cardiology consult.     Angeligue Bowne G. Everette Rank, MD     AGM/MEDQ  D:  07/12/2013  T:  07/12/2013  Job:  [id number]

## 2013-07-12 NOTE — Progress Notes (Signed)
*  PRELIMINARY RESULTS* Echocardiogram 2D Echocardiogram has been performed.  [place], [place] 07/12/2013, 9:57 AM

## 2013-07-12 NOTE — Progress Notes (Addendum)
ANTIBIOTIC CONSULT NOTE-Preliminary  Pharmacy Consult for Levofloxacin Indication: Acute bronchitis   No Known Allergies  Patient Measurements: Height: 5\' 3"  (160 cm) Weight: 140 lb 3.4 oz (63.6 kg) IBW/kg (Calculated) : 52.4   Vital Signs: Temp: 97.9 F (36.6 C) (01/18 2327) Temp src: Oral (01/18 2327) BP: 153/87 mmHg (01/18 2327) Pulse Rate: 97 (01/19 0005)  Labs:  Recent Labs  07/11/13 1930  WBC 7.5  HGB 11.9*  PLT 204  CREATININE 0.78    Estimated Creatinine Clearance: 57.1 ml/min (by C-G formula based on Cr of 0.78).  No results found for this basename: VANCOTROUGH, VANCOPEAK, VANCORANDOM, GENTTROUGH, GENTPEAK, GENTRANDOM, TOBRATROUGH, TOBRAPEAK, TOBRARND, AMIKACINPEAK, AMIKACINTROU, AMIKACIN,  in the last 72 hours   Microbiology: No results found for this or any previous visit (from the past 720 hour(s)).  Medical History: Past Medical History  Diagnosis Date  . Hx of degenerative disc disease   . Hypertension   . Hypercholesterolemia   . Stroke     Medications:   Assessment: 23 female with recent influenza treated with Tamiflu; now with worsening SOB, cough and dyspnea on exertion. X-ray neg for infiltrate; possible COPD. Levaquin for acute bronchitis.  Goal of Therapy:  Eradication of infection   Plan:  Preliminary review of pertinent patient information completed.  Protocol will be initiated with a one-time dose of levofloxacin 750 mg IV.  Forestine Na clinical pharmacist will complete review during morning rounds to assess patient and finalize treatment regimen.  Norberto Sorenson, [facility] 07/12/2013,12:26 AM  As above.  Continue Levaquin 750mg  IV q24h.  Change to oral Levaquin once appropriate.  Netta Cedars, PharmD, BCPS 07/12/2013@8 :53 AM

## 2013-07-12 NOTE — Consult Note (Signed)
CARDIOLOGY CONSULT NOTE   Patient ID: Connie Wood MRN: [id number] DOB/AGE: 73/04/1941 73 y.o.  Admit Date: 07/11/2013 Referring Physician: Everette Rank MD Primary Physician: Lanette Hampshire, MD Consulting Cardiologist: Carlyle Dolly Primary Cardiologist: New Reason for Consultation: CHF  Clinical Summary Connie Wood is a 73 y.o.female with known history of hypertension, hypercholesterolemia, CVA, and Rheumatic fever as a child, admitted with worsening dyspnea, with decreased O2 saturations. She was recently treated for the flu one week ago. She had a lot of coughing and congestion prior to admission. CXR demonstrated emphysema, but no acute cardiopulmonary disease. There is a concern for CHF with elevated Pro-BNP on admission of 889.5.    In ER she was treated with steroids, neb tx and IV lasix 73 mg. Uncertain diureses,as no documentation of output was recorded. She denies chest pain, palpitations, edema, dizziness or fatigue. Breathing status has improved. She is currently on levoquin for treatment of bronchitis. Echocardiogram has been completed. She states she had a GXT several years ago in [place] with Dr. Verl Blalock. She does not remember the result. She has some trouble with memory.    No Known Allergies  Medications Scheduled Medications: . sodium chloride   Intravenous STAT  . amLODipine  10 mg Oral Daily  . clorazepate  15 mg Oral Daily  . enoxaparin (LOVENOX) injection  40 mg Subcutaneous Q24H  . furosemide  20 mg Intravenous Q12H  . ipratropium-albuterol  3 mL Nebulization Q4H  . irbesartan  300 mg Oral Daily  . levofloxacin (LEVAQUIN) IV  750 mg Intravenous Q24H  . methylPREDNISolone (SOLU-MEDROL) injection  60 mg Intravenous Q6H  . sodium chloride  3 mL Intravenous Q12H  . venlafaxine XR  150 mg Oral Daily    PRN Medications: sodium chloride, acetaminophen, acetaminophen, ondansetron (ZOFRAN) IV, ondansetron, sodium chloride   Past Medical History    Diagnosis Date  . Hx of degenerative disc disease   . Hypertension   . Hypercholesterolemia   . Stroke     Past Surgical History  Procedure Laterality Date  . Cholecystectomy    . Abdominal hysterectomy    . Breast surgery    . Appendectomy    . Tonsillectomy      History reviewed. No pertinent family history.  Social History Connie Wood reports that she has been smoking Cigarettes.  She has been smoking about 0.00 packs per day. She does not have any smokeless tobacco history on file. Connie Wood reports that she does not drink alcohol.  Review of Systems Otherwise reviewed and negative except as outlined.  Physical Examination Blood pressure 142/75, pulse 85, temperature 97.4 F (36.3 C), temperature source Oral, resp. rate 18, height 5\' 3"  (1.6 m), weight 140 lb 3.4 oz (63.6 kg), SpO2 93.00%.  Intake/Output Summary (Last 24 hours) at 07/12/13 1020 Last data filed at 07/12/13 0855  Gross per 24 hour  Intake    120 ml  Output      0 ml  Net    120 ml    HEENT: Conjunctiva and lids normal, oropharynx clear with moist mucosa. Neck: Supple, no elevated JVP or carotid bruits, no thyromegaly. Lungs:Dimished in the left base. Some inspiratory crackles. Cardiac: Regular rate and rhythm, no S3 or significant systolic murmur, no pericardial rub. Abdomen: Soft, nontender, no hepatomegaly, bowel sounds present, no guarding or rebound. Extremities: No pitting edema, distal pulses 2+. Skin: Warm and dry. Musculoskeletal: No kyphosis. Neuropsychiatric: Alert and oriented x3, affect grossly appropriate.  Prior Cardiac Testing/Procedures  Echo  12/21/2010 - Left ventricle: The cavity size was normal. Wall thickness was   normal. Systolic function was normal. The estimated ejection   fraction was in the range of 60% to 65%. Wall motion was normal;   there were no regional wall motion abnormalities. Doppler   parameters are consistent with abnormal left ventricular    relaxation (grade 1 diastolic dysfunction). - Mitral valve: Trivial regurgitation. - Atrial septum: No defect or patent foramen ovale was identified. - Tricuspid valve: Mild regurgitation. - Pulmonary arteries: PA peak pressure: 110mm Hg (S). - Pericardium, extracardiac: There was no pericardial effusion.  Lab Results  Basic Metabolic Panel:  Recent Labs Lab 07/11/13 1930 07/12/13 0718  NA 138 140  K 4.0 3.9  CL 98 99  CO2 30 28  GLUCOSE 118* 183*  BUN 12 15  CREATININE 0.78 0.73  CALCIUM 8.8 9.2    Liver Function Tests:  Recent Labs Lab 07/12/13 0718  AST 32  ALT 19  ALKPHOS 262*  BILITOT 0.2*  PROT 7.4  ALBUMIN 3.0*    CBC:  Recent Labs Lab 07/11/13 1930 07/12/13 0718  WBC 7.5 4.2  NEUTROABS 5.1  --   HGB 11.9* 12.3  HCT 34.4* 38.1  MCV 95.0 96.7  PLT 204 214    Cardiac Enzymes:  Recent Labs Lab 07/11/13 1930 07/12/13 0059 07/12/13 0718  TROPONINI <0.30 <0.30 <0.30    Radiology: Dg Chest 2 View  07/11/2013   CLINICAL DATA:  Cough and congestion.  Fever.  EXAM: CHEST  2 VIEW  COMPARISON:  Two-view chest 02/25/2012.  FINDINGS: Heart size is normal. Emphysematous changes are again noted. Chronic interstitial coarsening is stable. No focal airspace disease is evident.  IMPRESSION: 1. No acute cardiopulmonary disease. 2. Stable emphysematous changes.   Electronically Signed   By: Lawrence Santiago M.D.   On: 07/11/2013 19:22     ECG: NSR 85 bpm.   Impression and Recommendations  1. Dyspnea: Multifactorial with recent flu, ongoing bronchitis, with emphysema noted on CXR. No overt signs of fluid overload at this time. Echo has been ordered. Last echo in 2012 demonstrated no valvular abnormality or reduced EF. Would not continue diuretics at this time. Elevated Pro-BNP likely related to lung disease.  2. Hypertension: BP is moderately controlled. Continue treatment with amlodipine, irbesartan. Echo pending.  3. Bronchitis: Ongoing treatment with  antibiotics, steroids, and neb tx.   Signed: Phill Myron. Purcell Nails NP [facility] 07/12/2013, 10:20 AM Co-Sign MD  Attending Note Patient seen and discussed with NP Purcell Nails. Symptoms most consistent with bronchitis/COPD exacerbation. No significnant volume overload by exam or chest xray. BNP only mildly elevated, will follow up echo results. Noted intermittent SVT on telemetry rates to 130s, looks most consistent with atach, likely related to her chronic lung disease. Does not appear to be overly symptomatic.Will stop norvac, change to non-dihydropyridine calcium channel blocker with diltiazem 60mg  bid. Follow up echo results. SOB symptoms much improved.   Carlyle Dolly MD

## 2013-07-12 NOTE — Progress Notes (Signed)
Patient being discharged with instructions given on medications,and follow up visits,patient verbalized understanding.Prescriptions sent with patient no c/o pain or discomfort noted.Accompanied by staff to an awaiting vehicle.

## 2013-07-12 NOTE — Progress Notes (Signed)
Inpatient Diabetes Program Recommendations  AACE/ADA: New Consensus Statement on Inpatient Glycemic Control (2013)  Target Ranges:  Prepandial:   less than 140 mg/dL      Peak postprandial:   less than 180 mg/dL (1-2 hours)      Critically ill patients:  140 - 180 mg/dL   Results for GAVINA, DILDINE (MRN [id number]) as of 07/12/2013 10:48  Ref. Range 07/11/2013 19:30 07/12/2013 07:18  Glucose Latest Range: 70-99 mg/dL 118 (H) 183 (H)    Inpatient Diabetes Program Recommendations Correction (SSI): While inpatient and receiving steroids, please order CBGs with Novolog correction ACHS. HgbA1C: Please consider ordering an A1C to evaluate glycemic control over the past 2-3 months.  Note: Patient does not have a documented history of diabetes.  Noted patient is receiving steroids which are likely cause of elevated glucose.  While inpatient and receiving steroids, please order CBGs with Novolog correction ACHS and an A1C.    Thanks, Barnie Alderman, RN, MSN, CCRN Diabetes Coordinator Inpatient Diabetes Program [phone] (Team Pager) [phone] ([facility] office) [phone] [facility] office)

## 2013-07-13 NOTE — Discharge Summary (Signed)
NAMEKEMPER, HOCHMAN         ACCOUNT NO.:  [id number]  MEDICAL RECORD NO.:  [id number]  LOCATION:  T016                          FACILITY:  [facility]  PHYSICIAN:  Ibrohim Simmers G. Everette Rank, MD   DATE OF BIRTH:  03/17/1941  DATE OF ADMISSION:  07/11/2013 DATE OF DISCHARGE:  01/19/2015LH                              DISCHARGE SUMMARY   DIAGNOSES: 1. Influenza. 2. Hypoxia. 3. Acute on chronic respiratory failure, chronic obstructive pulmonary     disease, and bronchitis. 4. Essential hypertension. 5. Elevated BNP secondary to lung disease.  CONDITION:  Stable and improved at the time of her discharge.  HISTORY OF PRESENT ILLNESS:  This patient developed influenza and was treated for this with Tamiflu as an outpatient.  She presented to the emergency room with worsening shortness of breath with the O2 sats dropping to 85% on walking.  She did have a chest x-ray, which was negative.  She is a smoker.  She did have and does have dyspnea with exertion.  She was seen in the emergency room, DuoNeb treatment, but continued to have hypoxia on exertion.  PHYSICAL EXAMINATION:  GENERAL:  Alert female. VITAL SIGNS:  Blood pressure 158/87, pulse 86, and temperature 99. HEENT:  Eyes PERRLA.  TMs negative.  Oropharynx benign. NECK:  Supple.  No JVD or thyroid abnormalities. HEART:  Regular rhythm.  No murmurs. LUNGS:  Occasional rhonchus heard over lower lung fields. ABDOMEN:  No palpable organs or masses.  No organomegaly. EXTREMITIES:  Free of edema.  LABORATORY DATA:  UA negative.  CBC; WBC 7.5, hemoglobin 11.9, hematocrit 34.4.  ProBNP 89.5.  Chemistries; sodium 138, potassium 4.0, chloride 98, CO2 of 30, glucose 118, BUN 12, creatinine 0.78, calcium 8.8, troponin less than 0.30.  X-RAYS:  Chest x-ray, no acute cardiopulmonary changes, stable emphysematous changes.  HOSPITAL COURSE:  The patient was treated with DuoNeb treatments in the emergency department, placed on nasal O2, and  subsequently admitted to the Med/Surg floor.  She remained on oxygen throughout her hospitalization.  Her O2 sats were normal.  She was continued on the following medications: 1. Levaquin 750 mg daily. 2. Solu-Medrol 125 mg every 6 hours. 3. DuoNeb treatments every 4 hours. 4. She was continued on Cardizem 60 mg every 12 hours. 5. Tranxene 15 mg daily. 6. Lovenox for DVT prophylaxis 40 mg daily. 7. Effexor XR 150 mg daily.  The patient on this regimen, rapidly became asymptomatic.  She was seen in consultation by Cardiology because of elevated proBNP.  We felt that elevated proBNP was likely related to lung disease.  Last 2D echocardiogram demonstrated no valvular abnormality or reduced ejection fraction.  Even though she had diuresis with Lasix on admission, we will not continue this, but continue the current antibiotics, steroids, neb treatment.  She will be discharged.  Follow as an outpatient.  O2 sats will be monitored prior to discharge.  Continue oxygen if needed.     Fionna Merriott G. Everette Rank, MD     AGM/MEDQ  D:  07/12/2013  T:  07/13/2013  Job:  [id number]

## 2013-07-13 NOTE — Discharge Summary (Signed)
NAMEGERYL, Connie Wood         ACCOUNT NO.:  [id number]  MEDICAL RECORD NO.:  [id number]  LOCATION:  U633                          FACILITY:  [facility]  PHYSICIAN:  Dannette Kinkaid G. Everette Rank, MD   DATE OF BIRTH:  1940-09-02  DATE OF ADMISSION:  07/11/2013 DATE OF DISCHARGE:  01/19/2015LH                              DISCHARGE SUMMARY   ADDENDUM  The patient will be taking the following medications: 1. Levaquin 750 mg daily. 2. Diltiazem 60 mg every 12 hours. 3. Nexium 40 mg daily. 4. Azor 10/40 one daily. 5. Effexor XR 150 mg daily. 6. Fioricet with Codeine 50/325 to 40/30 mg 1 every 6 hours as needed     for pain. 7. Tranxene 15 mg daily. 8. Chronulac 10 g daily. 9. Zofran 4 mg every 6 hours p.r.n.     [name] G. Everette Rank, MD     AGM/MEDQ  D:  07/12/2013  T:  07/13/2013  Job:  [id number]

## 2014-01-06 ENCOUNTER — Other Ambulatory Visit: Payer: Self-pay | Admitting: Obstetrics and Gynecology

## 2014-01-06 DIAGNOSIS — Z139 Encounter for screening, unspecified: Secondary | ICD-10-CM

## 2014-01-14 ENCOUNTER — Ambulatory Visit (HOSPITAL_COMMUNITY)
Admission: RE | Admit: 2014-01-14 | Discharge: 2014-01-14 | Disposition: A | Discharge status: Home / Self Care | Payer: Medicare Other | Source: Ambulatory Visit | Attending: Obstetrics and Gynecology | Admitting: Obstetrics and Gynecology

## 2014-01-14 DIAGNOSIS — Z1231 Encounter for screening mammogram for malignant neoplasm of breast: Secondary | ICD-10-CM | POA: Diagnosis not present

## 2014-01-14 DIAGNOSIS — Z139 Encounter for screening, unspecified: Secondary | ICD-10-CM

## 2014-02-03 ENCOUNTER — Encounter: Payer: Self-pay | Admitting: Adult Health

## 2014-02-03 ENCOUNTER — Ambulatory Visit (INDEPENDENT_AMBULATORY_CARE_PROVIDER_SITE_OTHER): Payer: Medicare Other | Admitting: Adult Health

## 2014-02-03 VITALS — BP 178/80 | HR 74 | Ht 61.5 in | Wt 140.0 lb

## 2014-02-03 DIAGNOSIS — S50811A Abrasion of right forearm, initial encounter: Secondary | ICD-10-CM

## 2014-02-03 DIAGNOSIS — Z1212 Encounter for screening for malignant neoplasm of rectum: Secondary | ICD-10-CM

## 2014-02-03 DIAGNOSIS — I10 Essential (primary) hypertension: Secondary | ICD-10-CM

## 2014-02-03 DIAGNOSIS — Z01419 Encounter for gynecological examination (general) (routine) without abnormal findings: Secondary | ICD-10-CM

## 2014-02-03 DIAGNOSIS — E7800 Pure hypercholesterolemia, unspecified: Secondary | ICD-10-CM

## 2014-02-03 DIAGNOSIS — W5503XA Scratched by cat, initial encounter: Secondary | ICD-10-CM

## 2014-02-03 HISTORY — DX: Scratched by cat, initial encounter: S50.811A

## 2014-02-03 LAB — HEMOCCULT GUIAC POC 1CARD (OFFICE): FECAL OCCULT BLD: NEGATIVE

## 2014-02-03 MED ORDER — PENICILLIN V POTASSIUM 500 MG PO TABS: 500.0000 mg | ORAL_TABLET | Freq: Four times a day (QID) | ORAL | Status: DC

## 2014-02-03 NOTE — Patient Instructions (Signed)
Physical in 2 year Mammogram yearly Call prn

## 2014-02-03 NOTE — Progress Notes (Signed)
Patient ID: Connie Wood, female   DOB: 1941-03-10, 73 y.o.   MRN: [id number] History of Present Illness: Connie Wood is a 73 year old white female, widowed, in for a physical. Had TIA in 2013 and flu and pneumonia this year.Has cat scratch right forearm that is mildly red.She sees Dr Everette Rank as PCP.She is wearing back support.  Current Medications, Allergies, Past Medical History, Past Surgical History, Family History and Social History were reviewed in [employer] record.     Review of Systems: Patient denies any daily headaches(has migraines occas.), blurred vision, shortness of breath, chest pain, abdominal pain, problems with bowel movements(uses lactulose),or urination, no joint pain or mood swings.She has moments when she is off balance(Has curved spine) and uses a cane and has a walker at home and she wears rubber bottom shoes.    Physical Exam:BP 178/80  Pulse 74  Ht 5' 1.5" (1.562 m)  Wt 140 lb (63.504 kg)  BMI 26.03 kg/m2 General:  Well developed, well nourished, no acute distress Skin:  Warm and dry, thin with bruising on arms Neck:  Midline trachea, normal thyroid, no carotid  bruits heard Lungs; Clear to auscultation bilaterally Breast:  No dominant palpable mass, retraction, or nipple discharge Cardiovascular: Regular rate and rhythm Abdomen:  Soft, non tender, no hepatosplenomegaly Pelvic:  External genitalia is normal in appearance for age, no lesions.  The vagina is atrophic.The cervix and uterus are absent. No  adnexal masses or tenderness noted. Rectal: Good sphincter tone, no polyps, or hemorrhoids felt.  Hemoccult negative.Mild rectocele, low. Extremities:  No swelling, has bruising on arms and has cat scratch right forearm, mildly red, has lots of spider veins, and area right inner calf where hematoma was I&D'd and saw PT for almost 3 weeks for  Wound care. Psych:  No mood changes, alert and cooperative, seems happy She requests labs  today.  Impression: Yearly gyn exam no pap HTN Hypercholesterolemia  Cat scratch right forearm    Plan: Rx Pen VK 500 mg 1 qid x 10 days,#40 no refills Check CBC,CMP,TSH and lipids, will send copy to Dr Everette Rank Physical in 2 years Mammogram yearly

## 2014-02-04 LAB — COMPREHENSIVE METABOLIC PANEL
ALK PHOS: 184 U/L — AB (ref 39–117)
ALT: 23 U/L (ref 0–35)
AST: 17 U/L (ref 0–37)
Albumin: 4.1 g/dL (ref 3.5–5.2)
BUN: 12 mg/dL (ref 6–23)
CO2: 30 mEq/L (ref 19–32)
Calcium: 9.1 mg/dL (ref 8.4–10.5)
Chloride: 100 mEq/L (ref 96–112)
Creat: 0.76 mg/dL (ref 0.50–1.10)
GLUCOSE: 85 mg/dL (ref 70–99)
Potassium: 4.2 mEq/L (ref 3.5–5.3)
SODIUM: 139 meq/L (ref 135–145)
TOTAL PROTEIN: 6.9 g/dL (ref 6.0–8.3)
Total Bilirubin: 0.4 mg/dL (ref 0.2–1.2)

## 2014-02-04 LAB — LIPID PANEL
CHOLESTEROL: 211 mg/dL — AB (ref 0–200)
HDL: 71 mg/dL (ref >39)
LDL Cholesterol: 115 mg/dL — ABNORMAL HIGH (ref 0–99)
TRIGLYCERIDES: 123 mg/dL (ref <150)
Total CHOL/HDL Ratio: 3 Ratio
VLDL: 25 mg/dL (ref 0–40)

## 2014-02-04 LAB — CBC
HCT: 37.8 % (ref 36.0–46.0)
HEMOGLOBIN: 12.8 g/dL (ref 12.0–15.0)
MCH: 31.1 pg (ref 26.0–34.0)
MCHC: 33.9 g/dL (ref 30.0–36.0)
MCV: 91.7 fL (ref 78.0–100.0)
Platelets: 268 10*3/uL (ref 150–400)
RBC: 4.12 MIL/uL (ref 3.87–5.11)
RDW: 14.1 % (ref 11.5–15.5)
WBC: 6.9 10*3/uL (ref 4.0–10.5)

## 2014-02-04 LAB — TSH: TSH: 0.643 u[IU]/mL (ref 0.350–4.500)

## 2014-02-07 ENCOUNTER — Telehealth: Payer: Self-pay | Admitting: Adult Health

## 2014-02-07 NOTE — Telephone Encounter (Signed)
Pt aware of labs will send copy to Dr Everette Rank

## 2014-04-05 ENCOUNTER — Other Ambulatory Visit: Payer: Self-pay | Admitting: Obstetrics and Gynecology

## 2014-04-06 ENCOUNTER — Encounter: Payer: Self-pay | Admitting: Adult Health

## 2014-04-06 ENCOUNTER — Ambulatory Visit (INDEPENDENT_AMBULATORY_CARE_PROVIDER_SITE_OTHER): Payer: Medicare Other | Admitting: Adult Health

## 2014-04-06 VITALS — BP 172/80 | Ht 61.0 in | Wt 141.0 lb

## 2014-04-06 DIAGNOSIS — Z1212 Encounter for screening for malignant neoplasm of rectum: Secondary | ICD-10-CM

## 2014-04-06 DIAGNOSIS — L290 Pruritus ani: Secondary | ICD-10-CM | POA: Insufficient documentation

## 2014-04-06 DIAGNOSIS — K649 Unspecified hemorrhoids: Secondary | ICD-10-CM | POA: Insufficient documentation

## 2014-04-06 HISTORY — DX: Unspecified hemorrhoids: K64.9

## 2014-04-06 LAB — HEMOCCULT GUIAC POC 1CARD (OFFICE): Fecal Occult Blood, POC: NEGATIVE

## 2014-04-06 MED ORDER — PRAMOXINE-HC 1-1 % EX CREA: TOPICAL_CREAM | Freq: Three times a day (TID) | CUTANEOUS | Status: DC

## 2014-04-06 NOTE — Progress Notes (Signed)
Subjective:     Patient ID: Connie Wood, female   DOB: [birth year], 73 y.o.   MRN: [id number]  HPI Maie is a 73 year old white female in complaining of anal itching was bad last night.  Review of Systems See HPI Reviewed past medical,surgical, social and family history. Reviewed medications and allergies.     Objective:   Physical Exam BP 172/80  Ht 5\' 1"  (1.549 m)  Wt 141 lb (63.957 kg)  BMI 26.66 kg/m2  On rectal exam has good tone,+internal hemorrhoid, negative hemoccult, no lesions  Assessment:     Anal itching Hemorrhoids     Plan:    Try tucks or witch hazel with wet wipes Rx anal pram HC disp 1 oz use 2-3 x daily prn with 1 refill Follow up prn Review handout on hemorrhoids

## 2014-04-06 NOTE — Patient Instructions (Signed)

## 2014-04-08 ENCOUNTER — Telehealth: Payer: Self-pay | Admitting: Adult Health

## 2014-04-08 NOTE — Telephone Encounter (Signed)
Still itching using analpram hc try some lidocaine [employer] has it.If this does not work will refer to Dr Gala Romney to see

## 2014-04-25 ENCOUNTER — Encounter: Payer: Self-pay | Admitting: Adult Health

## 2014-06-24 HISTORY — PX: EUS: SHX5427

## 2014-07-11 ENCOUNTER — Emergency Department (HOSPITAL_COMMUNITY): Payer: Medicare Other

## 2014-07-11 ENCOUNTER — Inpatient Hospital Stay (HOSPITAL_COMMUNITY)
Admission: EM | Admit: 2014-07-11 | Discharge: 2014-07-14 | DRG: 440 | Disposition: A | Discharge status: Home / Self Care | Payer: Medicare Other | Attending: Family Medicine | Admitting: Family Medicine

## 2014-07-11 ENCOUNTER — Encounter (HOSPITAL_COMMUNITY): Payer: Self-pay | Admitting: Emergency Medicine

## 2014-07-11 DIAGNOSIS — F17210 Nicotine dependence, cigarettes, uncomplicated: Secondary | ICD-10-CM | POA: Diagnosis present

## 2014-07-11 DIAGNOSIS — Z8673 Personal history of transient ischemic attack (TIA), and cerebral infarction without residual deficits: Secondary | ICD-10-CM

## 2014-07-11 DIAGNOSIS — I10 Essential (primary) hypertension: Secondary | ICD-10-CM | POA: Diagnosis present

## 2014-07-11 DIAGNOSIS — Z9049 Acquired absence of other specified parts of digestive tract: Secondary | ICD-10-CM | POA: Diagnosis present

## 2014-07-11 DIAGNOSIS — E7800 Pure hypercholesterolemia, unspecified: Secondary | ICD-10-CM | POA: Diagnosis present

## 2014-07-11 DIAGNOSIS — M47816 Spondylosis without myelopathy or radiculopathy, lumbar region: Secondary | ICD-10-CM | POA: Diagnosis present

## 2014-07-11 DIAGNOSIS — Z8719 Personal history of other diseases of the digestive system: Secondary | ICD-10-CM | POA: Diagnosis present

## 2014-07-11 DIAGNOSIS — K859 Acute pancreatitis without necrosis or infection, unspecified: Secondary | ICD-10-CM | POA: Insufficient documentation

## 2014-07-11 DIAGNOSIS — Z800 Family history of malignant neoplasm of digestive organs: Secondary | ICD-10-CM

## 2014-07-11 DIAGNOSIS — N200 Calculus of kidney: Secondary | ICD-10-CM | POA: Diagnosis present

## 2014-07-11 DIAGNOSIS — Z90710 Acquired absence of both cervix and uterus: Secondary | ICD-10-CM

## 2014-07-11 DIAGNOSIS — E876 Hypokalemia: Secondary | ICD-10-CM | POA: Diagnosis present

## 2014-07-11 DIAGNOSIS — M47814 Spondylosis without myelopathy or radiculopathy, thoracic region: Secondary | ICD-10-CM | POA: Diagnosis present

## 2014-07-11 DIAGNOSIS — R1013 Epigastric pain: Secondary | ICD-10-CM

## 2014-07-11 DIAGNOSIS — Z8249 Family history of ischemic heart disease and other diseases of the circulatory system: Secondary | ICD-10-CM

## 2014-07-11 DIAGNOSIS — E785 Hyperlipidemia, unspecified: Secondary | ICD-10-CM | POA: Diagnosis present

## 2014-07-11 LAB — CBC WITH DIFFERENTIAL/PLATELET
Basophils Absolute: 0 10*3/uL (ref 0.0–0.1)
Basophils Relative: 0 % (ref 0–1)
Eosinophils Absolute: 0 10*3/uL (ref 0.0–0.7)
Eosinophils Relative: 0 % (ref 0–5)
HCT: 44 % (ref 36.0–46.0)
Hemoglobin: 14.5 g/dL (ref 12.0–15.0)
Lymphocytes Relative: 10 % — ABNORMAL LOW (ref 12–46)
Lymphs Abs: 1.1 10*3/uL (ref 0.7–4.0)
MCH: 31.6 pg (ref 26.0–34.0)
MCHC: 33 g/dL (ref 30.0–36.0)
MCV: 95.9 fL (ref 78.0–100.0)
Monocytes Absolute: 0.3 10*3/uL (ref 0.1–1.0)
Monocytes Relative: 3 % (ref 3–12)
Neutro Abs: 9.3 10*3/uL — ABNORMAL HIGH (ref 1.7–7.7)
Neutrophils Relative %: 87 % — ABNORMAL HIGH (ref 43–77)
Platelets: 238 10*3/uL (ref 150–400)
RBC: 4.59 MIL/uL (ref 3.87–5.11)
RDW: 13.1 % (ref 11.5–15.5)
WBC: 10.8 10*3/uL — ABNORMAL HIGH (ref 4.0–10.5)

## 2014-07-11 LAB — COMPREHENSIVE METABOLIC PANEL
ALBUMIN: 4.2 g/dL (ref 3.5–5.2)
ALT: 82 U/L — ABNORMAL HIGH (ref 0–35)
AST: 32 U/L (ref 0–37)
Alkaline Phosphatase: 214 U/L — ABNORMAL HIGH (ref 39–117)
Anion gap: 9 (ref 5–15)
BUN: 21 mg/dL (ref 6–23)
CHLORIDE: 99 meq/L (ref 96–112)
CO2: 26 mmol/L (ref 19–32)
CREATININE: 0.66 mg/dL (ref 0.50–1.10)
Calcium: 9.1 mg/dL (ref 8.4–10.5)
GFR calc Af Amer: >90 mL/min (ref >90)
GFR calc non Af Amer: 86 mL/min — ABNORMAL LOW (ref >90)
Glucose, Bld: 189 mg/dL — ABNORMAL HIGH (ref 70–99)
Potassium: 3.7 mmol/L (ref 3.5–5.1)
Sodium: 134 mmol/L — ABNORMAL LOW (ref 135–145)
TOTAL PROTEIN: 7.2 g/dL (ref 6.0–8.3)
Total Bilirubin: 0.6 mg/dL (ref 0.3–1.2)

## 2014-07-11 LAB — TROPONIN I: Troponin I: <0.03 ng/mL (ref <0.031)

## 2014-07-11 LAB — LIPASE, BLOOD: Lipase: 282 U/L — ABNORMAL HIGH (ref 11–59)

## 2014-07-11 MED ORDER — ONDANSETRON HCL 4 MG/2ML IJ SOLN
4.0000 mg | Freq: Once | INTRAMUSCULAR | Status: AC
  Administered 2014-07-11: 4 mg via INTRAVENOUS
  Filled 2014-07-11: qty 2

## 2014-07-11 MED ORDER — MORPHINE SULFATE 4 MG/ML IJ SOLN
4.0000 mg | Freq: Once | INTRAMUSCULAR | Status: AC
  Administered 2014-07-11: 4 mg via INTRAVENOUS
  Filled 2014-07-11: qty 1

## 2014-07-11 MED ORDER — IOHEXOL 300 MG/ML  SOLN
100.0000 mL | Freq: Once | INTRAMUSCULAR | Status: AC | PRN
  Administered 2014-07-11: 100 mL via INTRAVENOUS

## 2014-07-11 MED ORDER — HYDRALAZINE HCL 25 MG PO TABS
25.0000 mg | ORAL_TABLET | Freq: Four times a day (QID) | ORAL | Status: DC | PRN
  Administered 2014-07-12 – 2014-07-13 (×3): 25 mg via ORAL
  Filled 2014-07-11 (×4): qty 1

## 2014-07-11 MED ORDER — ACETAMINOPHEN 325 MG PO TABS
650.0000 mg | ORAL_TABLET | Freq: Four times a day (QID) | ORAL | Status: DC | PRN
  Administered 2014-07-12 – 2014-07-14 (×4): 650 mg via ORAL
  Filled 2014-07-11 (×4): qty 2

## 2014-07-11 MED ORDER — ENOXAPARIN SODIUM 30 MG/0.3ML ~~LOC~~ SOLN
30.0000 mg | SUBCUTANEOUS | Status: DC
  Administered 2014-07-12: 30 mg via SUBCUTANEOUS
  Filled 2014-07-11: qty 0.3

## 2014-07-11 MED ORDER — ACETAMINOPHEN 650 MG RE SUPP: 650.0000 mg | Freq: Four times a day (QID) | RECTAL | Status: DC | PRN

## 2014-07-11 MED ORDER — MORPHINE SULFATE 2 MG/ML IJ SOLN
2.0000 mg | INTRAMUSCULAR | Status: DC | PRN
  Administered 2014-07-12: 2 mg via INTRAVENOUS
  Filled 2014-07-11 (×2): qty 1

## 2014-07-11 MED ORDER — SODIUM CHLORIDE 0.9 % IV SOLN
INTRAVENOUS | Status: DC
  Administered 2014-07-12 – 2014-07-13 (×5): via INTRAVENOUS

## 2014-07-11 MED ORDER — ONDANSETRON HCL 4 MG PO TABS: 4.0000 mg | ORAL_TABLET | Freq: Four times a day (QID) | ORAL | Status: DC | PRN

## 2014-07-11 MED ORDER — AMLODIPINE-OLMESARTAN 10-40 MG PO TABS: 1.0000 | ORAL_TABLET | Freq: Every day | ORAL | Status: DC

## 2014-07-11 MED ORDER — VENLAFAXINE HCL ER 75 MG PO CP24
150.0000 mg | ORAL_CAPSULE | Freq: Every day | ORAL | Status: DC
  Administered 2014-07-12 – 2014-07-14 (×3): 150 mg via ORAL
  Filled 2014-07-11 (×3): qty 2

## 2014-07-11 MED ORDER — LORAZEPAM 0.5 MG PO TABS
0.5000 mg | ORAL_TABLET | Freq: Every morning | ORAL | Status: DC
  Administered 2014-07-12 – 2014-07-13 (×2): 0.5 mg via ORAL
  Filled 2014-07-11 (×2): qty 1

## 2014-07-11 MED ORDER — IOHEXOL 300 MG/ML  SOLN
50.0000 mL | Freq: Once | INTRAMUSCULAR | Status: AC | PRN
  Administered 2014-07-11: 50 mL via ORAL

## 2014-07-11 MED ORDER — ONDANSETRON HCL 4 MG/2ML IJ SOLN
4.0000 mg | Freq: Four times a day (QID) | INTRAMUSCULAR | Status: DC | PRN
  Administered 2014-07-12 – 2014-07-13 (×4): 4 mg via INTRAVENOUS
  Filled 2014-07-11 (×4): qty 2

## 2014-07-11 MED ORDER — CLORAZEPATE DIPOTASSIUM 7.5 MG PO TABS
7.5000 mg | ORAL_TABLET | Freq: Every day | ORAL | Status: DC
  Administered 2014-07-12 – 2014-07-13 (×3): 7.5 mg via ORAL
  Filled 2014-07-11 (×3): qty 1

## 2014-07-11 MED ORDER — SODIUM CHLORIDE 0.9 % IV BOLUS (SEPSIS)
500.0000 mL | Freq: Once | INTRAVENOUS | Status: AC
  Administered 2014-07-11: 500 mL via INTRAVENOUS

## 2014-07-11 NOTE — ED Notes (Addendum)
Report given to [facility] on Dept 300, all questions answered.

## 2014-07-11 NOTE — ED Notes (Signed)
Onset this morning, vomiting up some bile, per daughter has been been confused this afternoon

## 2014-07-11 NOTE — ED Notes (Signed)
Pt c/o of pain under left breast which extends down to right abdominal area.

## 2014-07-11 NOTE — H&P (Signed)
PCP:   Lanette Hampshire, MD   Chief Complaint:  Abdominal pain  HPI:  74 year old female who  has a past medical history of degenerative disc disease; Hypertension; Hypercholesterolemia; Stroke; Migraines; Hypercholesteremia (02/03/2014); Cat scratch of right forearm (02/03/2014); and Hemorrhoid (04/06/2014). Today presents to the hospital with chief complaint of abdominal pain started this morning. Patient had nausea but no vomiting. She says that she has been having dry heaves. Patient says that the pain was 10/10 in intensity located mainly in the epigastric region. She denies fever no diarrhea no constipation. She has had cholecystectomy in the past along with appendectomy and hysterectomy. She denies chest pain or shortness of breath. No cough. In the ED patient was found to have elevated lipase 282, alkaline phosphatase 214 with normal liver enzymes.  Allergies:   Allergies  Allergen Reactions  . Adhesive [Tape] Other (See Comments)    Tears skin  . Latex Itching and Rash      Past Medical History  Diagnosis Date  . Hx of degenerative disc disease   . Hypertension   . Hypercholesterolemia   . Stroke   . Migraines   . Hypercholesteremia 02/03/2014  . Cat scratch of right forearm 02/03/2014  . Hemorrhoid 04/06/2014    Past Surgical History  Procedure Laterality Date  . Cholecystectomy    . Abdominal hysterectomy    . Breast surgery    . Appendectomy    . Tonsillectomy    . Bilateral salpingoophorectomy    . Anterior and posterior repair      Prior to Admission medications   Medication Sig Start Date End Date Taking? Authorizing Provider  AZOR 10-40 MG per tablet Take 1 tablet by mouth daily. 07/06/13  Yes Historical Provider, MD  butalbital-acetaminophen-caffeine (FIORICET WITH CODEINE) 50-325-40-30 MG per capsule Take 1 capsule by mouth every 6 (six) hours as needed (for back pain).  07/02/13  Yes Historical Provider, MD  clorazepate (TRANXENE) 15 MG tablet Take 7.5  mg by mouth at bedtime.  07/01/13  Yes Historical Provider, MD  doxycycline (VIBRA-TABS) 100 MG tablet Take 100 mg by mouth 2 (two) times daily. 7 day course starting on 07/04/2014 07/04/14  Yes Historical Provider, MD  fluocinonide cream (LIDEX) 4.58 % Apply 1 application topically daily. For itching/redness 06/20/14  Yes Historical Provider, MD  lactulose (CHRONULAC) 10 GM/15ML solution Take 10 g by mouth daily as needed for mild constipation.   Yes Historical Provider, MD  LORazepam (ATIVAN) 1 MG tablet Take 0.5 mg by mouth every morning.  06/11/14  Yes Historical Provider, MD  ofloxacin (OCUFLOX) 0.3 % ophthalmic solution Place 2 drops into the right ear daily as needed (for pain). 2 drops in right ear daily prn. 01/14/14  Yes Historical Provider, MD  OxyCODONE (OXYCONTIN) 40 mg T12A 12 hr tablet Take 40 mg by mouth every 12 (twelve) hours.   Yes Historical Provider, MD  pramoxine-hydrocortisone (ANALPRAM HC) cream Apply topically 3 (three) times daily. Patient taking differently: Apply 1 application topically daily as needed (for hemorrhoid pain).  04/06/14  Yes Estill Dooms, NP  venlafaxine XR (EFFEXOR-XR) 150 MG 24 hr capsule Take 150 mg by mouth daily. 07/03/13  Yes Historical Provider, MD  esomeprazole (NEXIUM) 40 MG capsule Take 1 capsule (40 mg total) by mouth daily. Patient not taking: Reported on 07/11/2014 10/28/12   Maudry Diego, MD  lactulose (CHRONULAC) 10 GM/15ML solution TAKE ONE TABLESPOONFUL ONCE DAILY FOR 4 WEEKS Patient not taking: Reported on 07/11/2014 04/05/14  Jonnie Kind, MD    Social History:  reports that she has been smoking Cigarettes.  She has never used smokeless tobacco. She reports that she does not drink alcohol or use illicit drugs.  Family History  Problem Relation Age of Onset  . Heart disease Mother   . Cancer Father     liver  . Cancer Son   . Heart disease Son   . Cancer Maternal Grandmother      All the positives are listed in  BOLD  Review of Systems:  HEENT: Headache, blurred vision, runny nose, sore throat Neck: Hypothyroidism, hyperthyroidism,,lymphadenopathy Chest : Shortness of breath, history of COPD, Asthma Heart : Chest pain, history of coronary arterey disease GI:  Nausea, vomiting, diarrhea, constipation, GERD GU: Dysuria, urgency, frequency of urination, hematuria Neuro: Stroke, seizures, syncope Psych: Depression, anxiety, hallucinations   Physical Exam: Blood pressure 182/87, pulse 82, temperature 97.9 F (36.6 C), temperature source Oral, resp. rate 18, height 5\' 4"  (1.626 m), weight 63.504 kg (140 lb), SpO2 98 %. Constitutional:   Patient is a well-developed and well-nourished *female in no acute distress and cooperative with exam. Head: Normocephalic and atraumatic Mouth: Mucus membranes moist Eyes: PERRL, EOMI, conjunctivae normal Neck: Supple, No Thyromegaly Cardiovascular: RRR, S1 normal, S2 normal Pulmonary/Chest: CTAB, no wheezes, rales, or rhonchi Abdominal: Soft. Mild tenderness in the epigastric region, non-distended, bowel sounds are normal, no masses, organomegaly, or guarding present.  Neurological: A&O x3, Strength is normal and symmetric bilaterally, cranial nerve II-XII are grossly intact, no focal motor deficit, sensory intact to light touch bilaterally.  Extremities : No Cyanosis, Clubbing or Edema  Labs on Admission:  Basic Metabolic Panel:  Recent Labs Lab 07/11/14 1910  NA 134*  K 3.7  CL 99  CO2 26  GLUCOSE 189*  BUN 21  CREATININE 0.66  CALCIUM 9.1   Liver Function Tests:  Recent Labs Lab 07/11/14 1910  AST 32  ALT 82*  ALKPHOS 214*  BILITOT 0.6  PROT 7.2  ALBUMIN 4.2    Recent Labs Lab 07/11/14 1910  LIPASE 282*   No results for input(s): AMMONIA in the last 168 hours. CBC:  Recent Labs Lab 07/11/14 1910  WBC 10.8*  NEUTROABS 9.3*  HGB 14.5  HCT 44.0  MCV 95.9  PLT 238   Cardiac Enzymes:  Recent Labs Lab 07/11/14 1910   TROPONINI <0.03    BNP (last 3 results) No results for input(s): PROBNP in the last 8760 hours. CBG: No results for input(s): GLUCAP in the last 168 hours.  Radiological Exams on Admission: Ct Abdomen Pelvis W Contrast  07/11/2014   CLINICAL DATA:  Epigastric pain, bilious vomiting  EXAM: CT ABDOMEN AND PELVIS WITH CONTRAST  TECHNIQUE: Multidetector CT imaging of the abdomen and pelvis was performed using the standard protocol following bolus administration of intravenous contrast.  CONTRAST:  63mL OMNIPAQUE IOHEXOL 300 MG/ML SOLN, 151mL OMNIPAQUE IOHEXOL 300 MG/ML SOLN  COMPARISON:  10/28/2012  FINDINGS: There is right middle lobe scarring.  The liver demonstrates no focal abnormality. There are dystrophic calcifications along the inferior margin of the liver. There is intrahepatic and extrahepatic biliary ductal dilatation. The common bile duct measures 14 mm in diameter. The gallbladder is surgically absent. The spleen demonstrates no focal abnormality.There are bilateral nonobstructing renal calculi. There is bilateral renal cortical scarring. There is coarse right upper pole renal cortical calcification. There is no hydronephrosis.  The pancreas enhances normally. There is a moderate amount of peripancreatic fluid and surrounding inflammatory  changes. There is no focal drainable fluid collection.  The bladder is unremarkable.  The stomach, duodenum, small intestine, and large intestine demonstrate no contrast extravasation or dilatation. There is no pneumoperitoneum, pneumatosis, or portal venous gas. There is no abdominal or pelvic free fluid. There is no lymphadenopathy.  The abdominal aorta is normal in caliber with atherosclerosis.  There are no lytic or sclerotic osseous lesions. There is diffuse degenerative disc disease and degenerative facet arthropathy throughout the thoracolumbar spine. Bilateral sacral augmentation.  IMPRESSION: 1. Peripancreatic fluid and inflammatory changes most  consistent with acute pancreatitis. No evidence of pancreatic necrosis. 2. Thoracolumbar spine spondylosis.   Electronically Signed   By: Kathreen Devoid   On: 07/11/2014 21:03    EKG: Independently reviewed. Sinus rhythm   Assessment/Plan Active Problems:   Hypertension   Hypercholesteremia   Pancreatitis  Acute pancreatitis We'll admit the patient, keep her nothing by mouth. IV fluid hydration with normal saline at 125 mL per hour. Repeat lipase in the a.m. Will also check abdominal ultrasound as well as lipid profile in a.m. Consult gastroenterology in a.m.  Hypertension Continue her medications also add hydralazine 25 mg by mouth every 6 hours when necessary for BP greater than 160/100.  Hypercholesterolemia Check lipid profile in a.m.   Code status: Patient is full code  Family discussion: Admission, patients condition and plan of care including tests being ordered have been discussed with the patient and her daughter at bedside* who indicate understanding and agree with the plan and Code Status.   Time Spent on Admission: 60 minutes  [facility] Pager: [phone] 07/11/2014, 10:05 PM  If 7PM-7AM, please contact night-coverage  [email or web address]  Password [email or web address]

## 2014-07-11 NOTE — ED Provider Notes (Signed)
CSN: [id number]     Arrival date & time 07/11/14  1723 History  This chart was scribed for [employer]. Alvino Chapel, MD by Molli Posey, ED Scribe. This patient was seen in room APA11/APA11 and the patient's care was started 6:41 PM.    Chief Complaint  Patient presents with  . Abdominal Pain   The history is provided by the patient. No language interpreter was used.   HPI Comments: Connie Wood is a 74 y.o. female with a history of HTN, stroke, cholecystectomy, hysterectomy and appendectomy who presents to the Emergency Department complaining of constant abdominal pain that started this morning. She states her pain started in her LUQ then moved down towards her stomach. Pt reports associated nausea and dry heaving but has not thrown up because she has not eaten today. Per daughter, pt has been confused this afternoon. Pt reports no known sick contacts. She reports no prior similar episodes. She denies cough, fevers, chills and SOB. Pt reports NKDA.    Past Medical History  Diagnosis Date  . Hx of degenerative disc disease   . Hypertension   . Hypercholesterolemia   . Stroke   . Migraines   . Hypercholesteremia 02/03/2014  . Cat scratch of right forearm 02/03/2014  . Hemorrhoid 04/06/2014   Past Surgical History  Procedure Laterality Date  . Cholecystectomy    . Abdominal hysterectomy    . Breast surgery    . Appendectomy    . Tonsillectomy    . Bilateral salpingoophorectomy    . Anterior and posterior repair     Family History  Problem Relation Age of Onset  . Heart disease Mother   . Cancer Father     liver  . Cancer Son   . Heart disease Son   . Cancer Maternal Grandmother    History  Substance Use Topics  . Smoking status: Current Every Day Smoker    Types: Cigarettes  . Smokeless tobacco: Never Used  . Alcohol Use: No   OB History    Gravida Para Term Preterm AB TAB SAB Ectopic Multiple Living   5 3   2  2   3      Review of Systems  Gastrointestinal:  Positive for vomiting and abdominal pain.  Psychiatric/Behavioral: Positive for confusion.  All other systems reviewed and are negative.     Allergies  Adhesive and Latex  Home Medications   Prior to Admission medications   Medication Sig Start Date End Date Taking? Authorizing Provider  AZOR 10-40 MG per tablet Take 1 tablet by mouth daily. 07/06/13  Yes Historical Provider, MD  butalbital-acetaminophen-caffeine (FIORICET WITH CODEINE) 50-325-40-30 MG per capsule Take 1 capsule by mouth every 6 (six) hours as needed (for back pain).  07/02/13  Yes Historical Provider, MD  clorazepate (TRANXENE) 15 MG tablet Take 7.5 mg by mouth at bedtime.  07/01/13  Yes Historical Provider, MD  doxycycline (VIBRA-TABS) 100 MG tablet Take 100 mg by mouth 2 (two) times daily. 7 day course starting on 07/04/2014 07/04/14  Yes Historical Provider, MD  fluocinonide cream (LIDEX) 5.62 % Apply 1 application topically daily. For itching/redness 06/20/14  Yes Historical Provider, MD  lactulose (CHRONULAC) 10 GM/15ML solution Take 10 g by mouth daily as needed for mild constipation.   Yes Historical Provider, MD  LORazepam (ATIVAN) 1 MG tablet Take 0.5 mg by mouth every morning.  06/11/14  Yes Historical Provider, MD  ofloxacin (OCUFLOX) 0.3 % ophthalmic solution Place 2 drops into the right ear  daily as needed (for pain). 2 drops in right ear daily prn. 01/14/14  Yes Historical Provider, MD  OxyCODONE (OXYCONTIN) 40 mg T12A 12 hr tablet Take 40 mg by mouth every 12 (twelve) hours.   Yes Historical Provider, MD  pramoxine-hydrocortisone (ANALPRAM HC) cream Apply topically 3 (three) times daily. Patient taking differently: Apply 1 application topically daily as needed (for hemorrhoid pain).  04/06/14  Yes Estill Dooms, NP  venlafaxine XR (EFFEXOR-XR) 150 MG 24 hr capsule Take 150 mg by mouth daily. 07/03/13  Yes Historical Provider, MD  esomeprazole (NEXIUM) 40 MG capsule Take 1 capsule (40 mg total) by mouth  daily. Patient not taking: Reported on 07/11/2014 10/28/12   Maudry Diego, MD  lactulose (CHRONULAC) 10 GM/15ML solution TAKE ONE TABLESPOONFUL ONCE DAILY FOR 4 WEEKS Patient not taking: Reported on 07/11/2014 04/05/14   Jonnie Kind, MD   BP 196/61 mmHg  Pulse 92  Temp(Src) 98.6 F (37 C) (Oral)  Resp 16  Ht 5\' 1"  (1.549 m)  Wt 138 lb (62.596 kg)  BMI 26.09 kg/m2  SpO2 96% Physical Exam  Constitutional: She is oriented to person, place, and time. She appears well-developed and well-nourished.  HENT:  Head: Normocephalic and atraumatic.  Eyes: Right eye exhibits no discharge. Left eye exhibits no discharge.  Neck: Neck supple. No tracheal deviation present.  Cardiovascular: Normal rate, regular rhythm and normal heart sounds.   Pulmonary/Chest: Effort normal and breath sounds normal. No respiratory distress. She has no wheezes. She has no rales.  Abdominal: She exhibits no distension and no mass. There is tenderness. There is no rebound and no guarding. No hernia.  Mild to moderate epigastric tenderness    Musculoskeletal: She exhibits no edema.  No lower extremity edema  Neurological: She is alert and oriented to person, place, and time.  Skin: Skin is warm and dry.  Psychiatric: She has a normal mood and affect. Her behavior is normal.  Nursing note and vitals reviewed.   ED Course  Procedures   DIAGNOSTIC STUDIES: Oxygen Saturation is 98% on RA, normal by my interpretation.    COORDINATION OF CARE: 6:47 PM Discussed treatment plan with pt at bedside and pt agreed to plan.   Labs Review Labs Reviewed  COMPREHENSIVE METABOLIC PANEL - Abnormal; Notable for the following:    Sodium 134 (*)    Glucose, Bld 189 (*)    ALT 82 (*)    Alkaline Phosphatase 214 (*)    GFR calc non Af Amer 86 (*)    All other components within normal limits  LIPASE, BLOOD - Abnormal; Notable for the following:    Lipase 282 (*)    All other components within normal limits  CBC WITH  DIFFERENTIAL - Abnormal; Notable for the following:    WBC 10.8 (*)    Neutrophils Relative % 87 (*)    Neutro Abs 9.3 (*)    Lymphocytes Relative 10 (*)    All other components within normal limits  TROPONIN I  CBC  COMPREHENSIVE METABOLIC PANEL  LIPASE, BLOOD    Imaging Review Ct Abdomen Pelvis W Contrast  07/11/2014   CLINICAL DATA:  Epigastric pain, bilious vomiting  EXAM: CT ABDOMEN AND PELVIS WITH CONTRAST  TECHNIQUE: Multidetector CT imaging of the abdomen and pelvis was performed using the standard protocol following bolus administration of intravenous contrast.  CONTRAST:  66mL OMNIPAQUE IOHEXOL 300 MG/ML SOLN, 112mL OMNIPAQUE IOHEXOL 300 MG/ML SOLN  COMPARISON:  10/28/2012  FINDINGS: There is right  middle lobe scarring.  The liver demonstrates no focal abnormality. There are dystrophic calcifications along the inferior margin of the liver. There is intrahepatic and extrahepatic biliary ductal dilatation. The common bile duct measures 14 mm in diameter. The gallbladder is surgically absent. The spleen demonstrates no focal abnormality.There are bilateral nonobstructing renal calculi. There is bilateral renal cortical scarring. There is coarse right upper pole renal cortical calcification. There is no hydronephrosis.  The pancreas enhances normally. There is a moderate amount of peripancreatic fluid and surrounding inflammatory changes. There is no focal drainable fluid collection.  The bladder is unremarkable.  The stomach, duodenum, small intestine, and large intestine demonstrate no contrast extravasation or dilatation. There is no pneumoperitoneum, pneumatosis, or portal venous gas. There is no abdominal or pelvic free fluid. There is no lymphadenopathy.  The abdominal aorta is normal in caliber with atherosclerosis.  There are no lytic or sclerotic osseous lesions. There is diffuse degenerative disc disease and degenerative facet arthropathy throughout the thoracolumbar spine. Bilateral  sacral augmentation.  IMPRESSION: 1. Peripancreatic fluid and inflammatory changes most consistent with acute pancreatitis. No evidence of pancreatic necrosis. 2. Thoracolumbar spine spondylosis.   Electronically Signed   By: Kathreen Devoid   On: 07/11/2014 21:03     EKG Interpretation None      MDM   Final diagnoses:  Epigastric abdominal pain  Acute pancreatitis, unspecified pancreatitis type   Patient with abdominal pain. Pancreatitis. Has had previously. CT scan shows same. Will admit to internal medicine. I personally performed the services described in this documentation, which was scribed in my presence. The recorded information has been reviewed and is accurate.       Jasper Riling. Alvino Chapel, MD 07/12/14 [id number]

## 2014-07-12 ENCOUNTER — Encounter (HOSPITAL_COMMUNITY): Payer: Self-pay | Admitting: Gastroenterology

## 2014-07-12 ENCOUNTER — Inpatient Hospital Stay (HOSPITAL_COMMUNITY): Payer: Medicare Other

## 2014-07-12 DIAGNOSIS — K859 Acute pancreatitis without necrosis or infection, unspecified: Secondary | ICD-10-CM | POA: Insufficient documentation

## 2014-07-12 LAB — COMPREHENSIVE METABOLIC PANEL
ALK PHOS: 201 U/L — AB (ref 39–117)
ALT: 71 U/L — AB (ref 0–35)
AST: 33 U/L (ref 0–37)
Albumin: 3.8 g/dL (ref 3.5–5.2)
Anion gap: 9 (ref 5–15)
BUN: 19 mg/dL (ref 6–23)
CO2: 28 mmol/L (ref 19–32)
Calcium: 8.9 mg/dL (ref 8.4–10.5)
Chloride: 99 mEq/L (ref 96–112)
Creatinine, Ser: 0.78 mg/dL (ref 0.50–1.10)
GFR, EST NON AFRICAN AMERICAN: 81 mL/min — AB (ref >90)
Glucose, Bld: 143 mg/dL — ABNORMAL HIGH (ref 70–99)
Potassium: 3.5 mmol/L (ref 3.5–5.1)
Sodium: 136 mmol/L (ref 135–145)
Total Bilirubin: 0.5 mg/dL (ref 0.3–1.2)
Total Protein: 6.8 g/dL (ref 6.0–8.3)

## 2014-07-12 LAB — CBC
HCT: 42.8 % (ref 36.0–46.0)
Hemoglobin: 14 g/dL (ref 12.0–15.0)
MCH: 31.3 pg (ref 26.0–34.0)
MCHC: 32.7 g/dL (ref 30.0–36.0)
MCV: 95.7 fL (ref 78.0–100.0)
Platelets: 266 10*3/uL (ref 150–400)
RBC: 4.47 MIL/uL (ref 3.87–5.11)
RDW: 13.1 % (ref 11.5–15.5)
WBC: 14.1 10*3/uL — AB (ref 4.0–10.5)

## 2014-07-12 LAB — LIPASE, BLOOD: Lipase: 468 U/L — ABNORMAL HIGH (ref 11–59)

## 2014-07-12 MED ORDER — ENOXAPARIN SODIUM 40 MG/0.4ML ~~LOC~~ SOLN
40.0000 mg | SUBCUTANEOUS | Status: DC
  Administered 2014-07-13: 40 mg via SUBCUTANEOUS
  Filled 2014-07-12 (×2): qty 0.4

## 2014-07-12 MED ORDER — IRBESARTAN 300 MG PO TABS
300.0000 mg | ORAL_TABLET | Freq: Every day | ORAL | Status: DC
  Administered 2014-07-12 – 2014-07-14 (×3): 300 mg via ORAL
  Filled 2014-07-12 (×3): qty 1

## 2014-07-12 MED ORDER — OXYCODONE HCL 5 MG PO TABS
10.0000 mg | ORAL_TABLET | ORAL | Status: AC
  Administered 2014-07-12: 10 mg via ORAL
  Filled 2014-07-12: qty 2

## 2014-07-12 MED ORDER — AMLODIPINE BESYLATE 5 MG PO TABS
10.0000 mg | ORAL_TABLET | Freq: Every day | ORAL | Status: DC
  Administered 2014-07-12 – 2014-07-14 (×3): 10 mg via ORAL
  Filled 2014-07-12 (×3): qty 2

## 2014-07-12 MED ORDER — BUTALBITAL-APAP-CAFFEINE 50-325-40 MG PO TABS
2.0000 | ORAL_TABLET | Freq: Four times a day (QID) | ORAL | Status: DC | PRN
  Administered 2014-07-12 – 2014-07-14 (×5): 2 via ORAL
  Filled 2014-07-12 (×4): qty 2

## 2014-07-12 MED ORDER — PANTOPRAZOLE SODIUM 40 MG PO TBEC
40.0000 mg | DELAYED_RELEASE_TABLET | Freq: Every day | ORAL | Status: DC
  Administered 2014-07-12 – 2014-07-14 (×3): 40 mg via ORAL
  Filled 2014-07-12 (×3): qty 1

## 2014-07-12 NOTE — Progress Notes (Signed)
Inpatient Diabetes Program Recommendations  AACE/ADA: New Consensus Statement on Inpatient Glycemic Control (2013)  Target Ranges:  Prepandial:   less than 140 mg/dL      Peak postprandial:   less than 180 mg/dL (1-2 hours)      Critically ill patients:  140 - 180 mg/dL   Results for ELISHIA, KACZOROWSKI (MRN [id number]) as of 07/12/2014 07:47  Ref. Range 07/11/2014 19:10 07/12/2014 06:11  Glucose Latest Range: 70-99 mg/dL 189 (H) 143 (H)    Diabetes history: None Outpatient Diabetes medications: None Current orders for Inpatient glycemic control: None  Inpatient Diabetes Program Recommendations HgbA1C: Noted that the patient does not have a history of DM. The Patients fasting CBG was elevated at 143 mg/dl this am. Please consider ordering an A1c to assess Glucose levels from the past 2-3 months. Insulin-correction: May want to consider ordering CBGs with Novolog correction.  Thanks,  Tama Headings RN, MSN, [facility] Inpatient Diabetes Coordinator Team Pager [phone]

## 2014-07-12 NOTE — Progress Notes (Signed)
Subjective: The patient is more comfortable today. She was admitted with abdominal pain nausea. She was found to have elevated lipase 282 and elevated alkaline phosphatase 214 but normal liver enzymes. She did have CT evidence of pancreatitis.  Objective: Vital signs in last 24 hours: Temp:  [97.9 F (36.6 C)-98.6 F (37 C)] 98.6 F (37 C) (01/19 0554) Pulse Rate:  [79-94] 94 (01/19 0554) Resp:  [11-18] 16 (01/19 0554) BP: (175-199)/(61-105) 188/105 mmHg (01/19 0554) SpO2:  [91 %-98 %] 96 % (01/19 0554) Weight:  [62.596 kg (138 lb)-63.504 kg (140 lb)] 62.596 kg (138 lb) (01/18 2353) Weight change:     Intake/Output from previous day: 01/18 0701 - 01/19 0700 In: -  Out: 650 [Urine:650] Intake/Output this shift: Total I/O In: -  Out: 650 [Urine:650]  Physical Exam: Examination general appearance patient is alert and oriented  HEENT negative  Neck supple no JVD or thyroid abnormalities  Heart regular rhythm no murmurs  Lungs clear to P&A  Abdomen no palpable organs or masses but slight tenderness in epigastrium and left upper quadrant  Extremities free of edema   Recent Labs  07/11/14 1910  WBC 10.8*  HGB 14.5  HCT 44.0  PLT 238   BMET  Recent Labs  07/11/14 1910  NA 134*  K 3.7  CL 99  CO2 26  GLUCOSE 189*  BUN 21  CREATININE 0.66  CALCIUM 9.1    Studies/Results: Ct Abdomen Pelvis W Contrast  07/11/2014   CLINICAL DATA:  Epigastric pain, bilious vomiting  EXAM: CT ABDOMEN AND PELVIS WITH CONTRAST  TECHNIQUE: Multidetector CT imaging of the abdomen and pelvis was performed using the standard protocol following bolus administration of intravenous contrast.  CONTRAST:  76mL OMNIPAQUE IOHEXOL 300 MG/ML SOLN, 157mL OMNIPAQUE IOHEXOL 300 MG/ML SOLN  COMPARISON:  10/28/2012  FINDINGS: There is right middle lobe scarring.  The liver demonstrates no focal abnormality. There are dystrophic calcifications along the inferior margin of the liver. There is  intrahepatic and extrahepatic biliary ductal dilatation. The common bile duct measures 14 mm in diameter. The gallbladder is surgically absent. The spleen demonstrates no focal abnormality.There are bilateral nonobstructing renal calculi. There is bilateral renal cortical scarring. There is coarse right upper pole renal cortical calcification. There is no hydronephrosis.  The pancreas enhances normally. There is a moderate amount of peripancreatic fluid and surrounding inflammatory changes. There is no focal drainable fluid collection.  The bladder is unremarkable.  The stomach, duodenum, small intestine, and large intestine demonstrate no contrast extravasation or dilatation. There is no pneumoperitoneum, pneumatosis, or portal venous gas. There is no abdominal or pelvic free fluid. There is no lymphadenopathy.  The abdominal aorta is normal in caliber with atherosclerosis.  There are no lytic or sclerotic osseous lesions. There is diffuse degenerative disc disease and degenerative facet arthropathy throughout the thoracolumbar spine. Bilateral sacral augmentation.  IMPRESSION: 1. Peripancreatic fluid and inflammatory changes most consistent with acute pancreatitis. No evidence of pancreatic necrosis. 2. Thoracolumbar spine spondylosis.   Electronically Signed   By: Kathreen Devoid   On: 07/11/2014 21:03    Medications:  . amLODipine  10 mg Oral Daily   And  . irbesartan  300 mg Oral Daily  . clorazepate  7.5 mg Oral QHS  . enoxaparin (LOVENOX) injection  30 mg Subcutaneous Q24H  . LORazepam  0.5 mg Oral q morning - 10a  . venlafaxine XR  150 mg Oral Daily    . sodium chloride 125 mL/hr at 07/12/14 0102  Assessment/Plan: 1. Acute pancreatitis plan to continue IV normal saline-proceed with abdominal ultrasound and gastroenterology consult  2. Essential hypertension-continue current medications amlodipine  3. Hyperlipidemia-to monitor chemistries   LOS: 1 day   Demichael Traum G 07/12/2014, 6:47  AM

## 2014-07-12 NOTE — Consult Note (Signed)
Referring Provider: Dr. Everette Rank Primary Care Physician:  Lanette Hampshire, MD Primary Gastroenterologist:  Dr. Gala Romney   Date of Admission: 07/11/14 Date of Consultation: 07/12/14  Reason for Consultation:  Pancreatitis  HPI:  Connie Wood is a 74 y.o. year old female  female presenting to the ED with abdominal pain and found to have a lipase of 282, alk phos 214, ALT 82, otherwise LFTs normal. CT shows pancreatitis. Previous episodes of elevated lipase noted in 2014, with CT at that time showing no clear evidence of acute pancreatitis but intrahepatic and extrahepatic biliary duct dilation and CBD dilation. CBD measured 76m in 2014, 14 mm current CT. Cholecystectomy in remote past. ERCP in 2003 secondary to choledocholithiasis, s/p sphincterotomy and stone removal.   Acute onset of epigastric pain/LUQ after drinking cappuccino yesterday morning.  Pain radiated down to lower abdomen. Felt like she was going to die all night. Couldn't get relief. Associated nausea, no vomiting. States now main complaint is head killing her. Abdominal pain improved from yesterday. Chronic migraines. Chronic smoker, no ETOH.   Prior history of intermittent constipation, takes lactulose prn for constipation. States decreased appetite. Believes she may have lost 15-20 lbs but not sure over what time frame. No melena or hematochezia. Last colonoscopy 2003. Overdue for routine screening electively.   Past Medical History  Diagnosis Date  . Hx of degenerative disc disease   . Hypertension   . Hypercholesterolemia   . Stroke   . Migraines   . Hypercholesteremia 02/03/2014  . Cat scratch of right forearm 02/03/2014  . Hemorrhoid 04/06/2014    Past Surgical History  Procedure Laterality Date  . Cholecystectomy    . Abdominal hysterectomy    . Breast surgery    . Appendectomy    . Tonsillectomy    . Bilateral salpingoophorectomy    . Anterior and posterior repair    . Ercp with sphincterotomy   2003    Dr. RGala Romney balloon dredging of bile duct, normal ampulla, choledocholithiasis   . Colonoscopy  2003    Dr. RGala Romney normal rectum, scattered diverticula    Prior to Admission medications   Medication Sig Start Date End Date Taking? Authorizing Provider  AZOR 10-40 MG per tablet Take 1 tablet by mouth daily. 07/06/13  Yes Historical Provider, MD  butalbital-acetaminophen-caffeine (FIORICET WITH CODEINE) 50-325-40-30 MG per capsule Take 1 capsule by mouth every 6 (six) hours as needed (for back pain).  07/02/13  Yes Historical Provider, MD  clorazepate (TRANXENE) 15 MG tablet Take 7.5 mg by mouth at bedtime.  07/01/13  Yes Historical Provider, MD  doxycycline (VIBRA-TABS) 100 MG tablet Take 100 mg by mouth 2 (two) times daily. 7 day course starting on 07/04/2014 07/04/14  Yes Historical Provider, MD  fluocinonide cream (LIDEX) 02.29% Apply 1 application topically daily. For itching/redness 06/20/14  Yes Historical Provider, MD  lactulose (CHRONULAC) 10 GM/15ML solution Take 10 g by mouth daily as needed for mild constipation.   Yes Historical Provider, MD  LORazepam (ATIVAN) 1 MG tablet Take 0.5 mg by mouth every morning.  06/11/14  Yes Historical Provider, MD  ofloxacin (OCUFLOX) 0.3 % ophthalmic solution Place 2 drops into the right ear daily as needed (for pain). 2 drops in right ear daily prn. 01/14/14  Yes Historical Provider, MD  OxyCODONE (OXYCONTIN) 40 mg T12A 12 hr tablet Take 40 mg by mouth every 12 (twelve) hours.   Yes Historical Provider, MD  pramoxine-hydrocortisone (ANALPRAM HC) cream Apply topically 3 (three)  times daily. Patient taking differently: Apply 1 application topically daily as needed (for hemorrhoid pain).  04/06/14  Yes Estill Dooms, NP  venlafaxine XR (EFFEXOR-XR) 150 MG 24 hr capsule Take 150 mg by mouth daily. 07/03/13  Yes Historical Provider, MD  esomeprazole (NEXIUM) 40 MG capsule Take 1 capsule (40 mg total) by mouth daily. Patient not taking: Reported on  07/11/2014 10/28/12   Maudry Diego, MD  lactulose [facility]) 10 GM/15ML solution TAKE ONE TABLESPOONFUL ONCE DAILY FOR 4 WEEKS Patient not taking: Reported on 07/11/2014 04/05/14   Jonnie Kind, MD    Current Facility-Administered Medications  Medication Dose Route Frequency Provider Last Rate Last Dose  . 0.9 %  sodium chloride infusion   Intravenous Continuous Oswald Hillock, MD 125 mL/hr at 07/12/14 0102    . acetaminophen (TYLENOL) tablet 650 mg  650 mg Oral Q6H PRN Oswald Hillock, MD   650 mg at 07/12/14 0932   Or  . acetaminophen (TYLENOL) suppository 650 mg  650 mg Rectal Q6H PRN Oswald Hillock, MD      . amLODipine (NORVASC) tablet 10 mg  10 mg Oral Daily Oswald Hillock, MD   10 mg at 07/12/14 0931   And  . irbesartan (AVAPRO) tablet 300 mg  300 mg Oral Daily Oswald Hillock, MD   300 mg at 07/12/14 0931  . clorazepate (TRANXENE) tablet 7.5 mg  7.5 mg Oral QHS Oswald Hillock, MD   7.5 mg at 07/12/14 0049  . enoxaparin (LOVENOX) injection 30 mg  30 mg Subcutaneous Q24H Oswald Hillock, MD   30 mg at 07/12/14 0933  . hydrALAZINE (APRESOLINE) tablet 25 mg  25 mg Oral Q6H PRN Oswald Hillock, MD   25 mg at 07/12/14 0048  . LORazepam (ATIVAN) tablet 0.5 mg  0.5 mg Oral q morning - 10a Oswald Hillock, MD   0.5 mg at 07/12/14 0932  . morphine 2 MG/ML injection 2 mg  2 mg Intravenous Q4H PRN Oswald Hillock, MD   2 mg at 07/12/14 0049  . ondansetron (ZOFRAN) tablet 4 mg  4 mg Oral Q6H PRN Oswald Hillock, MD       Or  . ondansetron (ZOFRAN) injection 4 mg  4 mg Intravenous Q6H PRN Oswald Hillock, MD   4 mg at 07/12/14 0933  . venlafaxine XR (EFFEXOR-XR) 24 hr capsule 150 mg  150 mg Oral Daily Oswald Hillock, MD   150 mg at 07/12/14 0931    Allergies as of 07/11/2014 - Review Complete 07/11/2014  Allergen Reaction Noted  . Adhesive [tape] Other (See Comments) 07/11/2014  . Latex Itching and Rash 07/11/2014    Family History  Problem Relation Age of Onset  . Heart disease Mother   . Cancer Father     liver  .  Cancer Son   . Heart disease Son   . Cancer Maternal Grandmother   . Colon cancer Neg Hx     History   Social History  . Marital Status: Widowed    Spouse Name: N/A    Number of Children: N/A  . Years of Education: N/A   Occupational History  . Not on file.   Social History Main Topics  . Smoking status: Current Every Day Smoker -- 1.00 packs/day    Types: Cigarettes  . Smokeless tobacco: Never Used  . Alcohol Use: No  . Drug Use: No  . Sexual Activity: No   Other Topics  Concern  . Not on file   Social History Narrative    Review of Systems: Gen: see HPI CV: negative Resp: Denies shortness of breath with rest, cough, wheezing GI: see HPI GU : Denies urinary burning, urinary frequency, urinary incontinence.  MS: +chronic back pain Derm: Denies rash, itching, dry skin Psych: short-term memory loss Heme: Denies bruising, bleeding, and enlarged lymph nodes.  Physical Exam: Vital signs in last 24 hours: Temp:  [97.9 F (36.6 C)-98.6 F (37 C)] 98.6 F (37 C) (01/19 0554) Pulse Rate:  [79-94] 94 (01/19 0554) Resp:  [11-18] 16 (01/19 0554) BP: (175-199)/(61-105) 188/105 mmHg (01/19 0554) SpO2:  [91 %-98 %] 96 % (01/19 0554) Weight:  [138 lb (62.596 kg)-140 lb (63.504 kg)] 138 lb (62.596 kg) (01/18 2353)   General:   Alert,  Well-developed, well-nourished, pleasant and cooperative in NAD. Hard of hearing.  Head:  Normocephalic and atraumatic. Eyes:  Sclera clear, no icterus.   Conjunctiva pink. Ears:  Normal auditory acuity. Nose:  No deformity, discharge,  or lesions. Mouth:  No deformity or lesions, dentition normal. Lungs:  Clear throughout to auscultation.   No wheezes, crackles, or rhonchi. No acute distress. Heart:  S1 S2 present; no murmurs, clicks, rubs,  or gallops. Abdomen:  Soft, nontender and nondistended. No masses, hepatosplenomegaly or hernias noted. Normal bowel sounds, without guarding, and without rebound.   Rectal:  Deferred until time of  colonoscopy.   Msk:  Symmetrical without gross deformities. Normal posture. Extremities:  Without  edema. Neurologic:  Alert and  oriented x4;  grossly normal neurologically. Skin:  Intact without significant lesions or rashes. Psych:  Alert and cooperative. Normal mood and affect.  Intake/Output from previous day: 01/18 0701 - 01/19 0700 In: -  Out: 650 [Urine:650] Intake/Output this shift:    Lab Results:  Recent Labs  07/11/14 1910 07/12/14 0611  WBC 10.8* 14.1*  HGB 14.5 14.0  HCT 44.0 42.8  PLT 238 266   BMET  Recent Labs  07/11/14 1910 07/12/14 0611  NA 134* 136  K 3.7 3.5  CL 99 99  CO2 26 28  GLUCOSE 189* 143*  BUN 21 19  CREATININE 0.66 0.78  CALCIUM 9.1 8.9   LFT  Recent Labs  07/11/14 1910 07/12/14 0611  PROT 7.2 6.8  ALBUMIN 4.2 3.8  AST 32 33  ALT 82* 71*  ALKPHOS 214* 201*  BILITOT 0.6 0.5   Lab Results  Component Value Date   LIPASE 468* 07/12/2014      Studies/Results: Ct Abdomen Pelvis W Contrast  07/11/2014   CLINICAL DATA:  Epigastric pain, bilious vomiting  EXAM: CT ABDOMEN AND PELVIS WITH CONTRAST  TECHNIQUE: Multidetector CT imaging of the abdomen and pelvis was performed using the standard protocol following bolus administration of intravenous contrast.  CONTRAST:  27m OMNIPAQUE IOHEXOL 300 MG/ML SOLN, 1031mOMNIPAQUE IOHEXOL 300 MG/ML SOLN  COMPARISON:  10/28/2012  FINDINGS: There is right middle lobe scarring.  The liver demonstrates no focal abnormality. There are dystrophic calcifications along the inferior margin of the liver. There is intrahepatic and extrahepatic biliary ductal dilatation. The common bile duct measures 14 mm in diameter. The gallbladder is surgically absent. The spleen demonstrates no focal abnormality.There are bilateral nonobstructing renal calculi. There is bilateral renal cortical scarring. There is coarse right upper pole renal cortical calcification. There is no hydronephrosis.  The pancreas enhances  normally. There is a moderate amount of peripancreatic fluid and surrounding inflammatory changes. There is no focal drainable fluid  collection.  The bladder is unremarkable.  The stomach, duodenum, small intestine, and large intestine demonstrate no contrast extravasation or dilatation. There is no pneumoperitoneum, pneumatosis, or portal venous gas. There is no abdominal or pelvic free fluid. There is no lymphadenopathy.  The abdominal aorta is normal in caliber with atherosclerosis.  There are no lytic or sclerotic osseous lesions. There is diffuse degenerative disc disease and degenerative facet arthropathy throughout the thoracolumbar spine. Bilateral sacral augmentation.  IMPRESSION: 1. Peripancreatic fluid and inflammatory changes most consistent with acute pancreatitis. No evidence of pancreatic necrosis. 2. Thoracolumbar spine spondylosis.   Electronically Signed   By: Kathreen Devoid   On: 07/11/2014 21:03    Impression: 75 year old female admitted with acute, uncomplicated pancreatitis of unknown etiology, with ultrasound of abdomen pending. Chronic intrahepatic and extrahepatic biliary ductal dilatation noted with CBD measuring 14 mm in diameter; gallbladder absent. Elevated LFTs likely multifactorial with acute pancreatitis, trending down. Appears she has a history of an elevated alk phos on multiple prior occasions. Clinically improved from admission with now complaints centering around migraines. Will await ultrasound findings to ensure no occult stone; may need further imaging via MRCP if inconclusive. Once acute illness resolves, would recommend dedicated pancreatic imaging via MRI/MRCP vs EUS to ensure no occult malignancy.   Plan: Remain NPO Supportive measures Add PPI Will follow-up on ultrasound Outpatient routine screening colonoscopy   Orvil Feil, ANP-BC [facility]     LOS: 1 day    07/12/2014, 9:50 AM

## 2014-07-12 NOTE — Progress Notes (Signed)
Nutrition Brief Note  Patient identified on the Malnutrition Screening Tool (MST) Report. Pt presents with abdominal pain. She has acute, uncomplicated pancreatitis.  Wt Readings from Last 15 Encounters:  07/11/14 138 lb (62.596 kg)  04/06/14 141 lb (63.957 kg)  02/03/14 140 lb (63.504 kg)  07/11/13 140 lb 3.4 oz (63.6 kg)  10/28/12 181 lb (82.101 kg)    Body mass index is 26.09 kg/(m^2). Patient meets criteria for  Overweight based on current BMI. Pt has distant hx of weight loss. Her weight is stable +/- 2# over past year.  Current diet order is NPO. Labs and medications reviewed.   No nutrition interventions warranted at this time.   Colman Cater MS,RD,CSG,LDN Office: [phone] Pager: [phone]

## 2014-07-12 NOTE — Progress Notes (Signed)
UR chart review completed.  

## 2014-07-12 NOTE — Progress Notes (Signed)
Pt c/o pain medications received not relieving headache at all throughout day. Pt reports having chronic migraines. Pt told nurse she takes Floricet with Codeine @ home, plus Oxycodone 40 mg. Notified Dr Anastasio Champion (on call) of this. Dr Anastasio Champion ordered x1 dose of Oxycodone 10mg  po now and that Dr Everette Rank will have to address this in the am. Will continue to monitor pt frequently throughout night. Daughter is at bedside. Bed is in lowest position and call bell is within reach.

## 2014-07-13 ENCOUNTER — Other Ambulatory Visit: Payer: Self-pay

## 2014-07-13 ENCOUNTER — Telehealth: Payer: Self-pay | Admitting: Gastroenterology

## 2014-07-13 ENCOUNTER — Encounter: Payer: Self-pay | Admitting: Internal Medicine

## 2014-07-13 DIAGNOSIS — Z8719 Personal history of other diseases of the digestive system: Secondary | ICD-10-CM

## 2014-07-13 LAB — COMPREHENSIVE METABOLIC PANEL
ALT: 44 U/L — AB (ref 0–35)
AST: 28 U/L (ref 0–37)
Albumin: 3.1 g/dL — ABNORMAL LOW (ref 3.5–5.2)
Alkaline Phosphatase: 164 U/L — ABNORMAL HIGH (ref 39–117)
Anion gap: 9 (ref 5–15)
BUN: 16 mg/dL (ref 6–23)
CHLORIDE: 103 meq/L (ref 96–112)
CO2: 25 mmol/L (ref 19–32)
Calcium: 8.1 mg/dL — ABNORMAL LOW (ref 8.4–10.5)
Creatinine, Ser: 0.72 mg/dL (ref 0.50–1.10)
GFR calc Af Amer: >90 mL/min (ref >90)
GFR, EST NON AFRICAN AMERICAN: 83 mL/min — AB (ref >90)
GLUCOSE: 127 mg/dL — AB (ref 70–99)
Potassium: 2.5 mmol/L — CL (ref 3.5–5.1)
SODIUM: 137 mmol/L (ref 135–145)
TOTAL PROTEIN: 5.6 g/dL — AB (ref 6.0–8.3)
Total Bilirubin: 0.7 mg/dL (ref 0.3–1.2)

## 2014-07-13 LAB — CBC
HCT: 38.5 % (ref 36.0–46.0)
Hemoglobin: 13 g/dL (ref 12.0–15.0)
MCH: 32.1 pg (ref 26.0–34.0)
MCHC: 33.8 g/dL (ref 30.0–36.0)
MCV: 95.1 fL (ref 78.0–100.0)
Platelets: 229 10*3/uL (ref 150–400)
RBC: 4.05 MIL/uL (ref 3.87–5.11)
RDW: 13.2 % (ref 11.5–15.5)
WBC: 16.4 10*3/uL — AB (ref 4.0–10.5)

## 2014-07-13 LAB — LIPASE, BLOOD: LIPASE: 180 U/L — AB (ref 11–59)

## 2014-07-13 MED ORDER — HYDRALAZINE HCL 25 MG PO TABS
25.0000 mg | ORAL_TABLET | Freq: Two times a day (BID) | ORAL | Status: DC
  Administered 2014-07-13 – 2014-07-14 (×3): 25 mg via ORAL
  Filled 2014-07-13 (×3): qty 1

## 2014-07-13 MED ORDER — POTASSIUM CHLORIDE 10 MEQ/100ML IV SOLN
INTRAVENOUS | Status: AC
  Filled 2014-07-13: qty 100

## 2014-07-13 MED ORDER — HYDRALAZINE HCL 20 MG/ML IJ SOLN
10.0000 mg | Freq: Once | INTRAMUSCULAR | Status: AC
  Administered 2014-07-14: 10 mg via INTRAVENOUS
  Filled 2014-07-13: qty 1

## 2014-07-13 MED ORDER — POTASSIUM CHLORIDE 10 MEQ/100ML IV SOLN
10.0000 meq | INTRAVENOUS | Status: AC
Start: 2014-07-13 — End: 2014-07-13
  Administered 2014-07-13 (×3): 10 meq via INTRAVENOUS
  Filled 2014-07-13 (×2): qty 100

## 2014-07-13 MED ORDER — OXYCODONE HCL 5 MG PO TABS
10.0000 mg | ORAL_TABLET | Freq: Four times a day (QID) | ORAL | Status: DC | PRN
  Administered 2014-07-13: 10 mg via ORAL
  Filled 2014-07-13: qty 2

## 2014-07-13 MED ORDER — HYDRALAZINE HCL 20 MG/ML IJ SOLN: 10.0000 mg | Freq: Four times a day (QID) | INTRAMUSCULAR | Status: DC | PRN

## 2014-07-13 NOTE — Telephone Encounter (Signed)
MRI/MRCP will need PA. Case # [id number] will let us know in 2 business days.

## 2014-07-13 NOTE — Progress Notes (Signed)
Subjective: Denies abdominal pain. No N/V. Wants to advance diet. Concerned about her 2 dogs and cat at home. Wants to go home.   Objective: Vital signs in last 24 hours: Temp:  [97 F (36.1 C)-99.7 F (37.6 C)] 97 F (36.1 C) (01/20 0500) Pulse Rate:  [94-105] 105 (01/20 0500) Resp:  [16] 16 (01/20 0500) BP: (166-178)/(91-110) 174/110 mmHg (01/19 2214) SpO2:  [95 %-96 %] 96 % (01/20 0500) Last BM Date:  (pt is unsure of date) General:   Alert and oriented, pleasant Head:  Normocephalic and atraumatic. Abdomen:  Bowel sounds present, soft, non-tender, non-distended. No HSM or hernias noted. No rebound or guarding. No masses appreciated  Extremities:  Without edema. Neurologic:  Alert and  oriented x4;  grossly normal neurologically. Psych:  Alert and cooperative. Normal mood and affect.  Intake/Output from previous day: 01/19 0701 - 01/20 0700 In: 2427.5 [P.O.:240; I.V.:2187.5] Out: 200 [Urine:200] Intake/Output this shift:    Lab Results:  Recent Labs  07/11/14 1910 07/12/14 0611  WBC 10.8* 14.1*  HGB 14.5 14.0  HCT 44.0 42.8  PLT 238 266   BMET  Recent Labs  07/11/14 1910 07/12/14 0611  NA 134* 136  K 3.7 3.5  CL 99 99  CO2 26 28  GLUCOSE 189* 143*  BUN 21 19  CREATININE 0.66 0.78  CALCIUM 9.1 8.9   LFT  Recent Labs  07/11/14 1910 07/12/14 0611  PROT 7.2 6.8  ALBUMIN 4.2 3.8  AST 32 33  ALT 82* 71*  ALKPHOS 214* 201*  BILITOT 0.6 0.5   Lab Results  Component Value Date   LIPASE 468* 07/12/2014     Studies/Results: US Abdomen Complete  07/12/2014   CLINICAL DATA:  Epigastric pain.  Pancreatitis.  EXAM: ULTRASOUND ABDOMEN COMPLETE  COMPARISON:  CT scan dated 07/11/2014  FINDINGS: Gallbladder: Removed  Common bile duct: Diameter: 11.8 mm.  No visible stone or mass.  Liver: Dilated intrahepatic bile ducts. No focal liver lesions. Calcifications along the inferior aspect of the right lobe of the liver as previously demonstrated on CT  scans.  IVC: No abnormality visualized.  Pancreas: There is a small amount of fluid around body of the pancreas. The common bile duct is dilated in the head of the pancreas. The tail of the pancreas is obscured.  Spleen: 7.6 cm in length.  Small amount of fluid around the spleen.  Right Kidney: Length: 10.2 cm. Small amount of perinephric fluid. Multiple renal stones. Echogenicity within normal limits. Right renal pelvis is chronically slightly prominent, unchanged since the CT scan of 11/08/2008.  Left Kidney: Length: 10.6 cm. Tiny stone in the upper pole. Echogenicity within normal limits. No mass or hydronephrosis visualized.  Abdominal aorta: 2.2 cm, normal  Other findings: None.  IMPRESSION: Increased distention of intra and extrahepatic bile ducts since CT scan of 11/08/2008. Fluid around the right kidney and around the pancreas consistent pancreatitis as demonstrated on the recent CT scan.  No discrete mass or stone within the common bile duct.   Electronically Signed   By: Rozetta Nunnery M.D.   On: 07/12/2014 11:20   Ct Abdomen Pelvis W Contrast  07/11/2014   CLINICAL DATA:  Epigastric pain, bilious vomiting  EXAM: CT ABDOMEN AND PELVIS WITH CONTRAST  TECHNIQUE: Multidetector CT imaging of the abdomen and pelvis was performed using the standard protocol following bolus administration of intravenous contrast.  CONTRAST:  58m OMNIPAQUE IOHEXOL 300 MG/ML SOLN, 1025mOMNIPAQUE IOHEXOL 300 MG/ML SOLN  COMPARISON:  10/28/2012  FINDINGS: There is right middle lobe scarring.  The liver demonstrates no focal abnormality. There are dystrophic calcifications along the inferior margin of the liver. There is intrahepatic and extrahepatic biliary ductal dilatation. The common bile duct measures 14 mm in diameter. The gallbladder is surgically absent. The spleen demonstrates no focal abnormality.There are bilateral nonobstructing renal calculi. There is bilateral renal cortical scarring. There is coarse right upper pole  renal cortical calcification. There is no hydronephrosis.  The pancreas enhances normally. There is a moderate amount of peripancreatic fluid and surrounding inflammatory changes. There is no focal drainable fluid collection.  The bladder is unremarkable.  The stomach, duodenum, small intestine, and large intestine demonstrate no contrast extravasation or dilatation. There is no pneumoperitoneum, pneumatosis, or portal venous gas. There is no abdominal or pelvic free fluid. There is no lymphadenopathy.  The abdominal aorta is normal in caliber with atherosclerosis.  There are no lytic or sclerotic osseous lesions. There is diffuse degenerative disc disease and degenerative facet arthropathy throughout the thoracolumbar spine. Bilateral sacral augmentation.  IMPRESSION: 1. Peripancreatic fluid and inflammatory changes most consistent with acute pancreatitis. No evidence of pancreatic necrosis. 2. Thoracolumbar spine spondylosis.   Electronically Signed   By: Kathreen Devoid   On: 07/11/2014 21:03    Assessment: 74 year old female admitted with acute, uncomplicated pancreatitis of unknown etiology. Chronic intrahepatic and extrahepatic biliary ductal dilatation noted with CBD measuring 14 mm in diameter; gallbladder absent. US abdomen without obvious mass or stone in bile duct. Concern remains for possible occult stone or malignancy; however, clinically patient has improved since admission.  Elevated LFTs likely multifactorial with acute pancreatitis, trending down. Appears she has a history of an elevated alk phos on multiple prior occasions. Anticipate discharge home later today if continues to tolerate diet advancement. Update labs this morning.    Once acute illness resolves, would recommend dedicated pancreatic imaging via MRI/MRCP in about 8 weeks.     Plan: Recheck labs this morning (CBC, CMP, lipase) PPI Supportive measures Advance as tolerated to low fat diet MRI/MRCP in 8 weeks Outpatient  routine screening colonoscopy   Orvil Feil, ANP-BC [facility]    LOS: 2 days    07/13/2014, 7:50 AM

## 2014-07-13 NOTE — Progress Notes (Addendum)
Dr. Luan Pulling called regarding high blood pressure (196/112 at 2151, hydralazine given as ordered, BP rechecked at 2322 for 179/89). Verbal orders given with readback to decrease fluids to KVO at 77ml/hr, one time dose of 10mg  hydralazine IV push, PRN 10mg  hydralazine IV push for systolic BP greater than 768 every 6 hours. Will continue to monitor BP.

## 2014-07-13 NOTE — Progress Notes (Signed)
Subjective: The patient is more comfortable today. She is free of upper abdominal pain. She does have a CT evidence of pancreatitis and has had elevated lipase and alkaline phosphatase. She remains nothing by mouth on IV fluids  Objective: Vital signs in last 24 hours: Temp:  [97 F (36.1 C)-99.7 F (37.6 C)] 97 F (36.1 C) (01/20 0500) Pulse Rate:  [94-105] 105 (01/20 0500) Resp:  [16] 16 (01/20 0500) BP: (166-178)/(91-110) 174/110 mmHg (01/19 2214) SpO2:  [95 %-96 %] 96 % (01/20 0500) Weight change:  Last BM Date:  (pt is unsure of date)  Intake/Output from previous day: 01/19 0701 - 01/20 0700 In: 2427.5 [P.O.:240; I.V.:2187.5] Out: 200 [Urine:200] Intake/Output this shift: Total I/O In: 240 [P.O.:240] Out: -   Physical Exam: Gen. appearance-the patient appears alert and oriented  HEENT negative  Neck supple no JVD or thyroid abnormalities  Heart regular rhythm no murmurs  Lungs clear to P&A  Abdomen no palpable organs or masses minimal tenderness  Extremities free of edema   Recent Labs  07/11/14 1910 07/12/14 0611  WBC 10.8* 14.1*  HGB 14.5 14.0  HCT 44.0 42.8  PLT 238 266   BMET  Recent Labs  07/11/14 1910 07/12/14 0611  NA 134* 136  K 3.7 3.5  CL 99 99  CO2 26 28  GLUCOSE 189* 143*  BUN 21 19  CREATININE 0.66 0.78  CALCIUM 9.1 8.9    Studies/Results: US Abdomen Complete  07/12/2014   CLINICAL DATA:  Epigastric pain.  Pancreatitis.  EXAM: ULTRASOUND ABDOMEN COMPLETE  COMPARISON:  CT scan dated 07/11/2014  FINDINGS: Gallbladder: Removed  Common bile duct: Diameter: 11.8 mm.  No visible stone or mass.  Liver: Dilated intrahepatic bile ducts. No focal liver lesions. Calcifications along the inferior aspect of the right lobe of the liver as previously demonstrated on CT scans.  IVC: No abnormality visualized.  Pancreas: There is a small amount of fluid around body of the pancreas. The common bile duct is dilated in the head of the pancreas. The  tail of the pancreas is obscured.  Spleen: 7.6 cm in length.  Small amount of fluid around the spleen.  Right Kidney: Length: 10.2 cm. Small amount of perinephric fluid. Multiple renal stones. Echogenicity within normal limits. Right renal pelvis is chronically slightly prominent, unchanged since the CT scan of 11/08/2008.  Left Kidney: Length: 10.6 cm. Tiny stone in the upper pole. Echogenicity within normal limits. No mass or hydronephrosis visualized.  Abdominal aorta: 2.2 cm, normal  Other findings: None.  IMPRESSION: Increased distention of intra and extrahepatic bile ducts since CT scan of 11/08/2008. Fluid around the right kidney and around the pancreas consistent pancreatitis as demonstrated on the recent CT scan.  No discrete mass or stone within the common bile duct.   Electronically Signed   By: Rozetta Nunnery M.D.   On: 07/12/2014 11:20   Ct Abdomen Pelvis W Contrast  07/11/2014   CLINICAL DATA:  Epigastric pain, bilious vomiting  EXAM: CT ABDOMEN AND PELVIS WITH CONTRAST  TECHNIQUE: Multidetector CT imaging of the abdomen and pelvis was performed using the standard protocol following bolus administration of intravenous contrast.  CONTRAST:  35mL OMNIPAQUE IOHEXOL 300 MG/ML SOLN, 141mL OMNIPAQUE IOHEXOL 300 MG/ML SOLN  COMPARISON:  10/28/2012  FINDINGS: There is right middle lobe scarring.  The liver demonstrates no focal abnormality. There are dystrophic calcifications along the inferior margin of the liver. There is intrahepatic and extrahepatic biliary ductal dilatation. The common bile duct measures  14 mm in diameter. The gallbladder is surgically absent. The spleen demonstrates no focal abnormality.There are bilateral nonobstructing renal calculi. There is bilateral renal cortical scarring. There is coarse right upper pole renal cortical calcification. There is no hydronephrosis.  The pancreas enhances normally. There is a moderate amount of peripancreatic fluid and surrounding inflammatory  changes. There is no focal drainable fluid collection.  The bladder is unremarkable.  The stomach, duodenum, small intestine, and large intestine demonstrate no contrast extravasation or dilatation. There is no pneumoperitoneum, pneumatosis, or portal venous gas. There is no abdominal or pelvic free fluid. There is no lymphadenopathy.  The abdominal aorta is normal in caliber with atherosclerosis.  There are no lytic or sclerotic osseous lesions. There is diffuse degenerative disc disease and degenerative facet arthropathy throughout the thoracolumbar spine. Bilateral sacral augmentation.  IMPRESSION: 1. Peripancreatic fluid and inflammatory changes most consistent with acute pancreatitis. No evidence of pancreatic necrosis. 2. Thoracolumbar spine spondylosis.   Electronically Signed   By: Kathreen Devoid   On: 07/11/2014 21:03    Medications:  . amLODipine  10 mg Oral Daily   And  . irbesartan  300 mg Oral Daily  . clorazepate  7.5 mg Oral QHS  . enoxaparin (LOVENOX) injection  40 mg Subcutaneous Q24H  . hydrALAZINE  25 mg Oral BID  . LORazepam  0.5 mg Oral q morning - 10a  . pantoprazole  40 mg Oral Daily  . venlafaxine XR  150 mg Oral Daily    . sodium chloride 125 mL/hr at 07/13/14 0227     Assessment/Plan: 1 acute pancreatitis-plan to continue IV fluids with gradual progression of diet-patient will be seen by gastroenterologist again today  2. Essential hypertension-continue current meds amlodipine and hydralazine  3. Hyperlipidemia continue to monitor   LOS: 2 days   Connie Wood G 07/13/2014, 6:25 AM

## 2014-07-13 NOTE — Telephone Encounter (Signed)
APPOINTMENT MADE AND LETTER SENT °

## 2014-07-13 NOTE — Progress Notes (Signed)
Shumeka, NT made nurse aware of pts BP 191/96 HR 105. Notified Dr Everette Rank, who ordered Hydralazine scheduled 25mg  BID and to D/C the PRN hydralazine order. Placing order now. No further orders regarding BP at this time. Will continue to monitor pt frequently throughout night. Bed is in lowest position and call bell is within reach.

## 2014-07-13 NOTE — Telephone Encounter (Signed)
Needs MRI/MRCP in 8 weeks, hx of pancreatitis, evaluate for occult malignancy and assess bile duct dilation.   Routine follow-up with [place] thereafter.

## 2014-07-13 NOTE — Progress Notes (Signed)
Per pt request, Dr. Everette Rank was called and questioned if pt could be discharged. Dr. Everette Rank wants the pt to stay one more night and be discharged in the morning. Pt aware of his orders and understands

## 2014-07-13 NOTE — Care Management Note (Signed)
    Page 1 of 1   07/13/2014     1:59:19 PM CARE MANAGEMENT NOTE 07/13/2014  Patient:  Connie Wood, Connie Wood   Account Number:  [id number]  Date Initiated:  07/13/2014  Documentation initiated by:  Theophilus Kinds  Subjective/Objective Assessment:   Pt admitted from home with pancreatitis. Pt lives alone and will return home at discharge. Pt is independent with ADL's.     Action/Plan:   No Cm needs noted. Pt is very anxious to be discharged today. Bedside RN to call MD and ask about discharge.   Anticipated DC Date:  07/14/2014   Anticipated DC Plan:  [place]  CM consult      Choice offered to / List presented to:             Status of service:  Completed, signed off Medicare Important Message given?   (If response is "NO", the following Medicare IM given date fields will be blank) Date Medicare IM given:   Medicare IM given by:   Date Additional Medicare IM given:   Additional Medicare IM given by:    Discharge Disposition:  HOME/SELF CARE  Per UR Regulation:    If discussed at Long Length of Stay Meetings, dates discussed:    Comments:  07/13/14 [place], RN BSN CM

## 2014-07-14 DIAGNOSIS — K850 Idiopathic acute pancreatitis: Secondary | ICD-10-CM

## 2014-07-14 LAB — BASIC METABOLIC PANEL
ANION GAP: 6 (ref 5–15)
ANION GAP: 6 (ref 5–15)
BUN: 10 mg/dL (ref 6–23)
BUN: 11 mg/dL (ref 6–23)
CALCIUM: 8.7 mg/dL (ref 8.4–10.5)
CHLORIDE: 102 meq/L (ref 96–112)
CO2: 25 mmol/L (ref 19–32)
CO2: 25 mmol/L (ref 19–32)
Calcium: 7.8 mg/dL — ABNORMAL LOW (ref 8.4–10.5)
Chloride: 105 mEq/L (ref 96–112)
Creatinine, Ser: 0.58 mg/dL (ref 0.50–1.10)
Creatinine, Ser: 0.69 mg/dL (ref 0.50–1.10)
GFR calc Af Amer: >90 mL/min (ref >90)
GFR calc non Af Amer: 89 mL/min — ABNORMAL LOW (ref >90)
GFR, EST NON AFRICAN AMERICAN: 84 mL/min — AB (ref >90)
Glucose, Bld: 103 mg/dL — ABNORMAL HIGH (ref 70–99)
Glucose, Bld: 130 mg/dL — ABNORMAL HIGH (ref 70–99)
POTASSIUM: 2.5 mmol/L — AB (ref 3.5–5.1)
POTASSIUM: 3.8 mmol/L (ref 3.5–5.1)
Sodium: 133 mmol/L — ABNORMAL LOW (ref 135–145)
Sodium: 136 mmol/L (ref 135–145)

## 2014-07-14 LAB — POTASSIUM: Potassium: 2.9 mmol/L — ABNORMAL LOW (ref 3.5–5.1)

## 2014-07-14 MED ORDER — LORAZEPAM 1 MG PO TABS
1.0000 mg | ORAL_TABLET | Freq: Four times a day (QID) | ORAL | Status: DC | PRN
  Administered 2014-07-14: 1 mg via ORAL
  Filled 2014-07-14: qty 1

## 2014-07-14 MED ORDER — POTASSIUM CHLORIDE CRYS ER 20 MEQ PO TBCR
40.0000 meq | EXTENDED_RELEASE_TABLET | Freq: Once | ORAL | Status: AC
  Administered 2014-07-14: 40 meq via ORAL
  Filled 2014-07-14: qty 2

## 2014-07-14 MED ORDER — AMLODIPINE BESYLATE 10 MG PO TABS: 10.0000 mg | ORAL_TABLET | Freq: Every day | ORAL | Status: DC

## 2014-07-14 MED ORDER — OXYCODONE HCL 5 MG PO TABS: 10.0000 mg | ORAL_TABLET | Freq: Four times a day (QID) | ORAL | Status: DC | PRN

## 2014-07-14 MED ORDER — POTASSIUM CHLORIDE 10 MEQ/100ML IV SOLN
10.0000 meq | INTRAVENOUS | Status: AC
  Administered 2014-07-14 (×3): 10 meq via INTRAVENOUS
  Filled 2014-07-14: qty 100

## 2014-07-14 MED ORDER — HYDRALAZINE HCL 25 MG PO TABS: 25.0000 mg | ORAL_TABLET | Freq: Two times a day (BID) | ORAL | Status: DC

## 2014-07-14 NOTE — Progress Notes (Signed)
CRITICAL VALUE ALERT  Critical value received:  Potassium 2.5  Date of notification:  07/14/14  Time of notification: 0145  Critical value read back:Yes.    Nurse who received alert:  Deeann Dowse RN  MD notified (1st page):  Luan Pulling  Time of first page:  0200  MD notified (2nd page):  Time of second page:  Responding MD: Luan Pulling  Time MD responded:  0200

## 2014-07-14 NOTE — Discharge Summary (Signed)
Physician Discharge Summary  Connie Wood PNT:[id number] DOB: 11-03-1940 DOA: 07/11/2014  PCP: Lanette Hampshire, MD  Admit date: 07/11/2014 Discharge date: 07/14/2014     Discharge Diagnoses:  1. Acute pancreatitis etiology undetermined 2. Hypokalemia 3. Hypertension essential 4. Hyperlipidemia 5. Stones in right kidney 6. Spondylosis of thoracic and lumbar spine 7.   Discharge Condition: Stable Disposition: Home  Diet recommendation: Regular diet  Filed Weights   07/11/14 1747 07/11/14 2353  Weight: 63.504 kg (140 lb) 62.596 kg (138 lb)    History of present illness:  The patient was admitted to the hospital through the emergency department with acute onset of upper abdominal pain. Pain was in epigastrium and left upper quadrant. Patient had episodes of nausea prior to admission. She was seen in emergency department. CT of abdomen showed evidence of acute pancreatitis she did have elevated serum lipase 282 alkaline phosphatase 214 with normal liver enzymes. Patient was subsequently admitted.  Hospital Course:  The patient was quite uncomfortable at the time of admission was given morphine sulfate 2 mg every 4 hours when necessary for pain. She is started on intravenous fluids half-normal saline. She was made nothing by mouth initially. Initial examination revealed abdominal tenderness in epigastrium and left upper quadrant. A CT of abdomen was completed and revealed evidence of acute pancreatitis, intrahepatic and extrahepatic biliary duct dilatation and common bile duct dilatation. Patient had previous cholecystectomy sphincterotomy and stone removal. The patient gradually improved. She is seen in consultation by gastroenterology service was recommended she have MRI and MRCP in approximately 8 weeks following discharge and screening colonoscopy. During her hospital stay it was noted that her potassium level was low at 2.5. On 2 separate occasions she was given IV potassium and  potassium level increased to 3.8. She also had elevated blood pressure and had to have hydralazine 10 mg IV given her pressure did come down some following this. Her lipase initially was 282 and alkaline phosphatase 214. She was continued on medications listed below. It was felt she could be discharged home after becoming relatively asymptomatic.   Discharge Instructions The patient is to continue medications listed below. She was instructed to return to primary care physician's office in approximately one week. Should be given KCl 10 mEq daily and hydralazine 50 mg twice a day. His prescription will be given to patient.    Medication List    STOP taking these medications        doxycycline 100 MG tablet  Commonly known as:  VIBRA-TABS     LORazepam 1 MG tablet  Commonly known as:  ATIVAN      TAKE these medications        amLODipine 10 MG tablet  Commonly known as:  NORVASC  Take 1 tablet (10 mg total) by mouth daily.     AZOR 10-40 MG per tablet  Generic drug:  amLODipine-olmesartan  Take 1 tablet by mouth daily.     butalbital-acetaminophen-caffeine 50-325-40-30 MG per capsule  Commonly known as:  FIORICET WITH CODEINE  Take 1 capsule by mouth every 6 (six) hours as needed (for back pain).     clorazepate 15 MG tablet  Commonly known as:  TRANXENE  Take 7.5 mg by mouth at bedtime.     esomeprazole 40 MG capsule  Commonly known as:  NEXIUM  Take 1 capsule (40 mg total) by mouth daily.     fluocinonide cream 0.05 %  Commonly known as:  LIDEX  Apply 1 application topically daily. For  itching/redness     hydrALAZINE 25 MG tablet  Commonly known as:  APRESOLINE  Take 1 tablet (25 mg total) by mouth 2 (two) times daily.     lactulose 10 GM/15ML solution  Commonly known as:  CHRONULAC  Take 10 g by mouth daily as needed for mild constipation.     ofloxacin 0.3 % ophthalmic solution  Commonly known as:  OCUFLOX  Place 2 drops into the right ear daily as needed (for  pain). 2 drops in right ear daily prn.     OXYCONTIN 40 mg T12a 12 hr tablet  Generic drug:  OxyCODONE  Take 40 mg by mouth every 12 (twelve) hours.     pramoxine-hydrocortisone cream  Commonly known as:  ANALPRAM HC  Apply topically 3 (three) times daily.     venlafaxine XR 150 MG 24 hr capsule  Commonly known as:  EFFEXOR-XR  Take 150 mg by mouth daily.       Allergies  Allergen Reactions  . Adhesive [Tape] Other (See Comments)    Tears skin  . Latex Itching and Rash    The results of significant diagnostics from this hospitalization (including imaging, microbiology, ancillary and laboratory) are listed below for reference.    Significant Diagnostic Studies: US Abdomen Complete  07/12/2014   CLINICAL DATA:  Epigastric pain.  Pancreatitis.  EXAM: ULTRASOUND ABDOMEN COMPLETE  COMPARISON:  CT scan dated 07/11/2014  FINDINGS: Gallbladder: Removed  Common bile duct: Diameter: 11.8 mm.  No visible stone or mass.  Liver: Dilated intrahepatic bile ducts. No focal liver lesions. Calcifications along the inferior aspect of the right lobe of the liver as previously demonstrated on CT scans.  IVC: No abnormality visualized.  Pancreas: There is a small amount of fluid around body of the pancreas. The common bile duct is dilated in the head of the pancreas. The tail of the pancreas is obscured.  Spleen: 7.6 cm in length.  Small amount of fluid around the spleen.  Right Kidney: Length: 10.2 cm. Small amount of perinephric fluid. Multiple renal stones. Echogenicity within normal limits. Right renal pelvis is chronically slightly prominent, unchanged since the CT scan of 11/08/2008.  Left Kidney: Length: 10.6 cm. Tiny stone in the upper pole. Echogenicity within normal limits. No mass or hydronephrosis visualized.  Abdominal aorta: 2.2 cm, normal  Other findings: None.  IMPRESSION: Increased distention of intra and extrahepatic bile ducts since CT scan of 11/08/2008. Fluid around the right kidney and  around the pancreas consistent pancreatitis as demonstrated on the recent CT scan.  No discrete mass or stone within the common bile duct.   Electronically Signed   By: Rozetta Nunnery M.D.   On: 07/12/2014 11:20   Ct Abdomen Pelvis W Contrast  07/11/2014   CLINICAL DATA:  Epigastric pain, bilious vomiting  EXAM: CT ABDOMEN AND PELVIS WITH CONTRAST  TECHNIQUE: Multidetector CT imaging of the abdomen and pelvis was performed using the standard protocol following bolus administration of intravenous contrast.  CONTRAST:  66mL OMNIPAQUE IOHEXOL 300 MG/ML SOLN, 133mL OMNIPAQUE IOHEXOL 300 MG/ML SOLN  COMPARISON:  10/28/2012  FINDINGS: There is right middle lobe scarring.  The liver demonstrates no focal abnormality. There are dystrophic calcifications along the inferior margin of the liver. There is intrahepatic and extrahepatic biliary ductal dilatation. The common bile duct measures 14 mm in diameter. The gallbladder is surgically absent. The spleen demonstrates no focal abnormality.There are bilateral nonobstructing renal calculi. There is bilateral renal cortical scarring. There is coarse right upper  pole renal cortical calcification. There is no hydronephrosis.  The pancreas enhances normally. There is a moderate amount of peripancreatic fluid and surrounding inflammatory changes. There is no focal drainable fluid collection.  The bladder is unremarkable.  The stomach, duodenum, small intestine, and large intestine demonstrate no contrast extravasation or dilatation. There is no pneumoperitoneum, pneumatosis, or portal venous gas. There is no abdominal or pelvic free fluid. There is no lymphadenopathy.  The abdominal aorta is normal in caliber with atherosclerosis.  There are no lytic or sclerotic osseous lesions. There is diffuse degenerative disc disease and degenerative facet arthropathy throughout the thoracolumbar spine. Bilateral sacral augmentation.  IMPRESSION: 1. Peripancreatic fluid and inflammatory  changes most consistent with acute pancreatitis. No evidence of pancreatic necrosis. 2. Thoracolumbar spine spondylosis.   Electronically Signed   By: Kathreen Devoid   On: 07/11/2014 21:03    Microbiology: No results found for this or any previous visit (from the past 240 hour(s)).   Labs: Basic Metabolic Panel:  Recent Labs Lab 07/11/14 1910 07/12/14 0611 07/13/14 0936 07/14/14 0116 07/14/14 0715 07/14/14 1539  NA 134* 136 137 133*  --  136  K 3.7 3.5 2.5* 2.5* 2.9* 3.8  CL 99 99 103 102  --  105  CO2 26 28 25 25   --  25  GLUCOSE 189* 143* 127* 103*  --  130*  BUN 21 19 16 10   --  11  CREATININE 0.66 0.78 0.72 0.58  --  0.69  CALCIUM 9.1 8.9 8.1* 7.8*  --  8.7   Liver Function Tests:  Recent Labs Lab 07/11/14 1910 07/12/14 0611 07/13/14 0936  AST 32 33 28  ALT 82* 71* 44*  ALKPHOS 214* 201* 164*  BILITOT 0.6 0.5 0.7  PROT 7.2 6.8 5.6*  ALBUMIN 4.2 3.8 3.1*    Recent Labs Lab 07/11/14 1910 07/12/14 0611 07/13/14 0936  LIPASE 282* 468* 180*   No results for input(s): AMMONIA in the last 168 hours. CBC:  Recent Labs Lab 07/11/14 1910 07/12/14 0611 07/13/14 0936  WBC 10.8* 14.1* 16.4*  NEUTROABS 9.3*  --   --   HGB 14.5 14.0 13.0  HCT 44.0 42.8 38.5  MCV 95.9 95.7 95.1  PLT 238 266 229   Cardiac Enzymes:  Recent Labs Lab 07/11/14 1910  TROPONINI <0.03   BNP: BNP (last 3 results) No results for input(s): PROBNP in the last 8760 hours. CBG: No results for input(s): GLUCAP in the last 168 hours.  Active Problems:   Hypertension   Hypercholesteremia   Pancreatitis   Acute pancreatitis   Time coordinating discharge: 45 minutes  Signed:  Marjean Donna, MD 07/14/2014, 6:58 PM

## 2014-07-14 NOTE — Progress Notes (Addendum)
    Subjective: Patient doing well today from a GI standpoint. She's very sleepy at the time of visit, had an episode of anxiety about needing to get home to care for her dogs and was given 1 mg po ativan. Does open eyes to voice and able to communicate. Denies abdominal pain, N/V. Tolerating diet well. Wants to go home.  Objective: Vital signs in last 24 hours: Temp:  [98.2 F (36.8 C)-99 F (37.2 C)] 98.7 F (37.1 C) (01/21 0558) Pulse Rate:  [95-100] 95 (01/21 0558) Resp:  [18] 18 (01/21 0558) BP: (159-196)/(89-112) 167/96 mmHg (01/21 0558) SpO2:  [96 %-98 %] 96 % (01/21 0558) Last BM Date:  (pt unsure of date) General:   Sleepy, awakens to voice, pleasant Head:  Normocephalic and atraumatic. Heart:  S1, S2 present, no murmurs noted.  Lungs: Clear to auscultation bilaterally, without wheezing, rales, or rhonchi.  Abdomen:  Bowel sounds present, soft, non-tender, non-distended. No HSM or hernias noted. No rebound or guarding. No masses appreciated  Extremities:  Without clubbing or edema. Neurologic:  Awakens to voice, answers questions appropriately;  grossly normal neurologically. Skin:  Warm and dry, intact without significant lesions.   Intake/Output from previous day: 01/20 0701 - 01/21 0700 In: 240 [P.O.:240] Out: 300 [Urine:300] Intake/Output this shift:    Lab Results:  Recent Labs  07/11/14 1910 07/12/14 0611 07/13/14 0936  WBC 10.8* 14.1* 16.4*  HGB 14.5 14.0 13.0  HCT 44.0 42.8 38.5  PLT 238 266 229   BMET  Recent Labs  07/12/14 0611 07/13/14 0936 07/14/14 0116 07/14/14 0715  NA 136 137 133*  --   K 3.5 2.5* 2.5* 2.9*  CL 99 103 102  --   CO2 $Re'28 25 25  'PvU$ --   GLUCOSE 143* 127* 103*  --   BUN $Re'19 16 10  'zFP$ --   CREATININE 0.78 0.72 0.58  --   CALCIUM 8.9 8.1* 7.8*  --    LFT  Recent Labs  07/11/14 1910 07/12/14 0611 07/13/14 0936  PROT 7.2 6.8 5.6*  ALBUMIN 4.2 3.8 3.1*  AST 32 33 28  ALT 82* 71* 44*  ALKPHOS 214* 201* 164*  BILITOT 0.6  0.5 0.7     Studies/Results: No results found.  Assessment: 74 year old female admitted with acute, uncomplicated pancreatitis of unknown etiology. Chronic intrahepatic and extrahepatic biliary ductal dilatation noted with CBD measuring 14 mm in diameter; gallbladder absent. US abdomen without obvious mass or stone in bile duct. Concern remains for possible occult stone or malignancy; however, clinically patient has improved since admission. Elevated LFTs likely multifactorial with acute pancreatitis, trending down. Appears she has a history of an elevated alk phos on multiple prior occasions. Critical serum potassium 2.5 this morning, improved to 2.9 with 80 meq po K, receiving IV potassium at the moment. Tolerating her diet well, no N/V or abdominal pain.   Planned outpatient MRI/MRCP in about 8 weeks after resolution of acute illness. Appointment for this requested from our office, pre-certification in process.   Plan: Continue PPI Supportive measures Continue to monitor diet tolerance Planned MRI/MRCP in 8 weeks (precert in process) Follow-up with GI after hospitalization Due for screening colonoscopy, last noted TCS 2003. Will schedule as routine outpatient.   Walden Field, AGNP-C Adult & Gerontological Nurse Practitioner [facility]    LOS: 3 days    07/14/2014, 11:20 AM

## 2014-07-21 NOTE — Telephone Encounter (Signed)
Received call from [employer] this am. They state that after medical review that are requesting a peer-peer review. The number to call is [phone] option 4 .   CASE # [id number]

## 2014-07-25 NOTE — Telephone Encounter (Signed)
Received fax from [facility] and pts MRI/MRCP  Abdomen was denied because she had a CT scan on 07/11/2014.

## 2014-08-15 ENCOUNTER — Telehealth: Payer: Self-pay

## 2014-08-15 ENCOUNTER — Telehealth: Payer: Self-pay | Admitting: Adult Health

## 2014-08-15 NOTE — Telephone Encounter (Signed)
Pt is severely constipated. She has taken 2 dulcolax tablets, lactulose, and fleet enema with no results. She is also very raw on her bottom area from having to sit in wet clothes in the ER the other night. She is hurting really bad from all of this. She can not remember when her last good BM was. She has an appointment on 08/30/14 with AS but she is wanting to see RMR only and I told her that he was booked out and she understood. Please advise what we can do to help her. Her call back number is [phone]

## 2014-08-15 NOTE — Telephone Encounter (Signed)
Disregard prior note. Start Miralax BID. Progress report tomorrow if no improvement.

## 2014-08-15 NOTE — Telephone Encounter (Signed)
Pt is aware and will try and call us back tomorrow.

## 2014-08-15 NOTE — Telephone Encounter (Addendum)
See above

## 2014-08-15 NOTE — Telephone Encounter (Signed)
Pt called was seen in ER and admitted 1/18 for pancreatitis and was discharged 1/21. She complains of not having BM and is uncomfortable, has tried dulcolax and lactulose and fleets enema without results, told her to call Dr Roseanne Kaufman office for appt.She has F/U 3?8 but needs to be seen before then,if can't get in will call back and I will try to see her. She also says bottom sore from staying in wet panties in ER,is using "fannie cream"

## 2014-08-15 NOTE — Telephone Encounter (Signed)
Attempted a peer to peer review. They stated it would not overturn the decision. Stated we needed to do an appeal. I will need to draft a letter; please have it faxed to [phone].

## 2014-08-16 ENCOUNTER — Telehealth: Payer: Self-pay | Admitting: Internal Medicine

## 2014-08-16 ENCOUNTER — Telehealth: Payer: Self-pay | Admitting: Gastroenterology

## 2014-08-16 NOTE — Telephone Encounter (Signed)
Noted  

## 2014-08-16 NOTE — Telephone Encounter (Signed)
Pt had called yesterday to move her OV up to something sooner and she wanted RMR only.  I told her that RMR was scheduled out to April, but I could get her on the schedule for 2/25 (Thursday) with EG. Patient did not accept this appointment and did not want to see EG. Her OV was moved up for 3/8 with AS and she agreed after speaking with GF. She calls again today and spoke with Erline Levine that she was told to come in on Thursday to see EG and Erline Levine told her that her OV was on the 8th to see AS. Patient said that she told me to schedule her for this Thursday and after I spoke with GF Ginger also offered her the OV with EG for Thursday and she told GF that she wanted to wait and we did not put her on the schedule and left it for 3/8 with AS

## 2014-08-16 NOTE — Telephone Encounter (Signed)
Moved her appointment up to March 2 @ 1:30 with EG. She is going to continue with the miralax BID.

## 2014-08-16 NOTE — Telephone Encounter (Signed)
Pt is coming in on March 2 @ 1:30. She is going continue with the Miralax BID

## 2014-08-16 NOTE — Telephone Encounter (Signed)
PATIENT CALLED TO LET YOU KNOW THAT SHE HAD A BOWEL MOVEMENT

## 2014-08-17 NOTE — Telephone Encounter (Signed)
Noted  

## 2014-08-20 ENCOUNTER — Encounter (HOSPITAL_COMMUNITY): Payer: Self-pay | Admitting: Emergency Medicine

## 2014-08-20 ENCOUNTER — Emergency Department (HOSPITAL_COMMUNITY)
Admission: EM | Admit: 2014-08-20 | Discharge: 2014-08-20 | Disposition: A | Discharge status: Home / Self Care | Payer: Medicare Other | Attending: Emergency Medicine | Admitting: Emergency Medicine

## 2014-08-20 ENCOUNTER — Emergency Department (HOSPITAL_COMMUNITY): Payer: Medicare Other

## 2014-08-20 DIAGNOSIS — Z79899 Other long term (current) drug therapy: Secondary | ICD-10-CM | POA: Insufficient documentation

## 2014-08-20 DIAGNOSIS — G43909 Migraine, unspecified, not intractable, without status migrainosus: Secondary | ICD-10-CM | POA: Diagnosis not present

## 2014-08-20 DIAGNOSIS — Z8639 Personal history of other endocrine, nutritional and metabolic disease: Secondary | ICD-10-CM | POA: Diagnosis not present

## 2014-08-20 DIAGNOSIS — I10 Essential (primary) hypertension: Secondary | ICD-10-CM | POA: Insufficient documentation

## 2014-08-20 DIAGNOSIS — R945 Abnormal results of liver function studies: Secondary | ICD-10-CM

## 2014-08-20 DIAGNOSIS — R7989 Other specified abnormal findings of blood chemistry: Secondary | ICD-10-CM | POA: Diagnosis not present

## 2014-08-20 DIAGNOSIS — Z91040 Latex allergy status: Secondary | ICD-10-CM | POA: Insufficient documentation

## 2014-08-20 DIAGNOSIS — K5900 Constipation, unspecified: Secondary | ICD-10-CM | POA: Insufficient documentation

## 2014-08-20 DIAGNOSIS — M545 Low back pain, unspecified: Secondary | ICD-10-CM

## 2014-08-20 DIAGNOSIS — Z720 Tobacco use: Secondary | ICD-10-CM | POA: Insufficient documentation

## 2014-08-20 DIAGNOSIS — Z8673 Personal history of transient ischemic attack (TIA), and cerebral infarction without residual deficits: Secondary | ICD-10-CM | POA: Insufficient documentation

## 2014-08-20 LAB — COMPREHENSIVE METABOLIC PANEL
ALBUMIN: 3.4 g/dL — AB (ref 3.5–5.2)
ALT: 79 U/L — AB (ref 0–35)
ANION GAP: 9 (ref 5–15)
AST: 254 U/L — ABNORMAL HIGH (ref 0–37)
Alkaline Phosphatase: 197 U/L — ABNORMAL HIGH (ref 39–117)
BUN: 14 mg/dL (ref 6–23)
CO2: 28 mmol/L (ref 19–32)
Calcium: 8.6 mg/dL (ref 8.4–10.5)
Chloride: 103 mmol/L (ref 96–112)
Creatinine, Ser: 0.84 mg/dL (ref 0.50–1.10)
GFR calc Af Amer: 78 mL/min — ABNORMAL LOW (ref >90)
GFR calc non Af Amer: 67 mL/min — ABNORMAL LOW (ref >90)
GLUCOSE: 101 mg/dL — AB (ref 70–99)
Potassium: 3.7 mmol/L (ref 3.5–5.1)
Sodium: 140 mmol/L (ref 135–145)
Total Bilirubin: 0.4 mg/dL (ref 0.3–1.2)
Total Protein: 6.6 g/dL (ref 6.0–8.3)

## 2014-08-20 LAB — URINALYSIS, ROUTINE W REFLEX MICROSCOPIC
Bilirubin Urine: NEGATIVE
Glucose, UA: NEGATIVE mg/dL
HGB URINE DIPSTICK: NEGATIVE
KETONES UR: NEGATIVE mg/dL
NITRITE: NEGATIVE
Protein, ur: 30 mg/dL — AB
SPECIFIC GRAVITY, URINE: 1.025 (ref 1.005–1.030)
UROBILINOGEN UA: 0.2 mg/dL (ref 0.0–1.0)
pH: 5 (ref 5.0–8.0)

## 2014-08-20 LAB — URINE MICROSCOPIC-ADD ON

## 2014-08-20 LAB — CBC WITH DIFFERENTIAL/PLATELET
Basophils Absolute: 0 10*3/uL (ref 0.0–0.1)
Basophils Relative: 1 % (ref 0–1)
Eosinophils Absolute: 0.2 10*3/uL (ref 0.0–0.7)
Eosinophils Relative: 4 % (ref 0–5)
HCT: 34.3 % — ABNORMAL LOW (ref 36.0–46.0)
Hemoglobin: 10.9 g/dL — ABNORMAL LOW (ref 12.0–15.0)
LYMPHS PCT: 29 % (ref 12–46)
Lymphs Abs: 1.6 10*3/uL (ref 0.7–4.0)
MCH: 31.1 pg (ref 26.0–34.0)
MCHC: 31.8 g/dL (ref 30.0–36.0)
MCV: 97.7 fL (ref 78.0–100.0)
MONO ABS: 0.4 10*3/uL (ref 0.1–1.0)
MONOS PCT: 7 % (ref 3–12)
NEUTROS ABS: 3.3 10*3/uL (ref 1.7–7.7)
NEUTROS PCT: 59 % (ref 43–77)
PLATELETS: 209 10*3/uL (ref 150–400)
RBC: 3.51 MIL/uL — ABNORMAL LOW (ref 3.87–5.11)
RDW: 13.5 % (ref 11.5–15.5)
WBC: 5.5 10*3/uL (ref 4.0–10.5)

## 2014-08-20 LAB — ACETAMINOPHEN LEVEL

## 2014-08-20 LAB — POC OCCULT BLOOD, ED: Fecal Occult Bld: NEGATIVE

## 2014-08-20 LAB — ETHANOL: Alcohol, Ethyl (B): <5 mg/dL (ref 0–9)

## 2014-08-20 LAB — LIPASE, BLOOD: Lipase: 29 U/L (ref 11–59)

## 2014-08-20 MED ORDER — OXYCODONE-ACETAMINOPHEN 5-325 MG PO TABS
1.0000 | ORAL_TABLET | Freq: Once | ORAL | Status: AC
  Administered 2014-08-20: 1 via ORAL
  Filled 2014-08-20: qty 1

## 2014-08-20 MED ORDER — IOHEXOL 300 MG/ML  SOLN
100.0000 mL | Freq: Once | INTRAMUSCULAR | Status: AC | PRN
  Administered 2014-08-20: 100 mL via INTRAVENOUS

## 2014-08-20 MED ORDER — IOHEXOL 300 MG/ML  SOLN
25.0000 mL | Freq: Once | INTRAMUSCULAR | Status: AC | PRN
  Administered 2014-08-20: 25 mL via ORAL

## 2014-08-20 NOTE — ED Notes (Signed)
Pt able to ambulate safely with cane per baseline, pt given PO fluids as this time

## 2014-08-20 NOTE — ED Provider Notes (Signed)
CSN: [id number]     Arrival date & time 08/20/14  1131 History  This chart was scribed for Connie Essex, MD by Molli Posey, ED Scribe. This patient was seen in room APA05/APA05 and the patient's care was started 1:09 PM.    Chief Complaint  Patient presents with  . Back Pain  . Abdominal Pain   HPI HPI Comments: Connie Wood is a 74 y.o. female with a history of DDD, HTN and stroke who presents to the Emergency Department complaining of right lower back pain that started yesterday after pt reached for something. She states that her pain is in the same location as her normal chronic back pain but is worse at this time. Pt reports that it does not radiate to her legs. She reports she takes oxycontin and fioricet 3 which has provided her some pain relief but not completely resolved her pain. Pt reports a history of pancreatitis last month but says her symptoms do not feel similar to that. Pt reports a past surgical history of cholecystectomy and appendectomy. Pt denies weakness, fever, bowel or bladder incontinence, vomiting, dysuria , hematuria and CP.   Pt also complains of lower abdominal pain that started last night. Pt reports she has been constipated recently. Pt states she has tried miralax 3 days ago which triggered a BM. She denies blood in her stool.   Past Medical History  Diagnosis Date  . Hx of degenerative disc disease   . Hypertension   . Hypercholesterolemia   . Stroke   . Migraines   . Hypercholesteremia 02/03/2014  . Cat scratch of right forearm 02/03/2014  . Hemorrhoid 04/06/2014   Past Surgical History  Procedure Laterality Date  . Cholecystectomy    . Abdominal hysterectomy    . Breast surgery    . Appendectomy    . Tonsillectomy    . Bilateral salpingoophorectomy    . Anterior and posterior repair    . Ercp with sphincterotomy  2003    Dr. Gala Romney: balloon dredging of bile duct, normal ampulla, choledocholithiasis   . Colonoscopy  2003    Dr. Gala Romney:  normal rectum, scattered diverticula   Family History  Problem Relation Age of Onset  . Heart disease Mother   . Cancer Father     liver  . Cancer Son   . Heart disease Son   . Cancer Maternal Grandmother   . Colon cancer Neg Hx    History  Substance Use Topics  . Smoking status: Current Every Day Smoker -- 1.00 packs/day for 50 years    Types: Cigarettes  . Smokeless tobacco: Never Used  . Alcohol Use: No   OB History    Gravida Para Term Preterm AB TAB SAB Ectopic Multiple Living   5 3   2  2   3      Review of Systems  Constitutional: Negative for fever.  Gastrointestinal: Positive for constipation. Negative for nausea, vomiting and diarrhea.  Genitourinary: Negative for dysuria and hematuria.  Musculoskeletal: Positive for back pain.  Neurological: Negative for weakness.  All other systems reviewed and are negative.   Allergies  Adhesive and Latex  Home Medications   Prior to Admission medications   Medication Sig Start Date End Date Taking? Authorizing Provider  AZOR 10-40 MG per tablet Take 1 tablet by mouth daily. 07/06/13  Yes Historical Provider, MD  butalbital-acetaminophen-caffeine (FIORICET WITH CODEINE) 50-325-40-30 MG per capsule Take 1 capsule by mouth every 6 (six) hours as needed (for back  pain).  07/02/13  Yes Historical Provider, MD  clorazepate (TRANXENE) 15 MG tablet Take 7.5 mg by mouth daily.  07/01/13  Yes Historical Provider, MD  esomeprazole (NEXIUM) 40 MG capsule Take 1 capsule (40 mg total) by mouth daily. 10/28/12  Yes Maudry Diego, MD  hydrALAZINE (APRESOLINE) 25 MG tablet Take 1 tablet (25 mg total) by mouth 2 (two) times daily. 07/14/14  Yes Angus Ailene Ravel, MD  lactulose (CHRONULAC) 10 GM/15ML solution Take 10 g by mouth daily as needed for mild constipation.   Yes Historical Provider, MD  ofloxacin (OCUFLOX) 0.3 % ophthalmic solution Place 2 drops into the right ear daily as needed (for pain). 2 drops in right ear daily prn. 01/14/14  Yes  Historical Provider, MD  OxyCODONE (OXYCONTIN) 40 mg T12A 12 hr tablet Take 40 mg by mouth every 12 (twelve) hours.   Yes Historical Provider, MD  pramoxine-hydrocortisone (ANALPRAM HC) cream Apply topically 3 (three) times daily. Patient taking differently: Apply 1 application topically daily as needed (for hemorrhoid pain).  04/06/14  Yes Estill Dooms, NP  venlafaxine XR (EFFEXOR-XR) 150 MG 24 hr capsule Take 300 mg by mouth daily.  07/03/13  Yes Historical Provider, MD  amLODipine (NORVASC) 10 MG tablet Take 1 tablet (10 mg total) by mouth daily. Patient not taking: Reported on 08/20/2014 07/14/14   Lanette Hampshire, MD  NAFTIN 2 % CREA  07/29/14   Historical Provider, MD   BP 165/95 mmHg  Pulse 82  Temp(Src) 97.7 F (36.5 C) (Oral)  Resp 16  Ht 5' 1.5" (1.562 m)  Wt 140 lb (63.504 kg)  BMI 26.03 kg/m2  SpO2 92% Physical Exam  Constitutional: She is oriented to person, place, and time. She appears well-developed and well-nourished. No distress.  HENT:  Head: Normocephalic and atraumatic.  Mouth/Throat: Oropharynx is clear and moist. No oropharyngeal exudate.  Eyes: Conjunctivae and EOM are normal. Pupils are equal, round, and reactive to light.  Neck: Normal range of motion. Neck supple.  No meningismus.  Cardiovascular: Normal rate, regular rhythm, normal heart sounds and intact distal pulses.   No murmur heard. Pulmonary/Chest: Effort normal and breath sounds normal. No respiratory distress.  Abdominal: Soft. There is no tenderness. There is no rebound and no guarding.  Genitourinary:  No fecal impaction. Chaperone present.   Musculoskeletal: Normal range of motion. She exhibits no edema or tenderness.  Right paraspinal lumber tenderness and midline tenderness. 5/5 strength in bilateral lower extremities. Ankle plantar and dorsiflexion intact. Great toe extension intact bilaterally. +2 DP and PT pulses. +2 patellar reflexes bilaterally.   Neurological: She is alert and  oriented to person, place, and time. No cranial nerve deficit. She exhibits normal muscle tone. Coordination normal.  No ataxia on finger to nose bilaterally. No pronator drift. 5/5 strength throughout. CN 2-12 intact. Negative Romberg. Equal grip strength. Sensation intact. Gait is normal.   Skin: Skin is warm.  Psychiatric: She has a normal mood and affect. Her behavior is normal.  Nursing note and vitals reviewed.   ED Course  Procedures  DIAGNOSTIC STUDIES: Oxygen Saturation is 94% on RA, normal by my interpretation.    COORDINATION OF CARE: 1:21 PM Discussed treatment plan with pt at bedside and pt agreed to plan.   Labs Review Labs Reviewed  URINALYSIS, ROUTINE W REFLEX MICROSCOPIC - Abnormal; Notable for the following:    APPearance HAZY (*)    Protein, ur 30 (*)    Leukocytes, UA TRACE (*)  All other components within normal limits  URINE MICROSCOPIC-ADD ON - Abnormal; Notable for the following:    Squamous Epithelial / LPF FEW (*)    Bacteria, UA FEW (*)    All other components within normal limits  CBC WITH DIFFERENTIAL/PLATELET - Abnormal; Notable for the following:    RBC 3.51 (*)    Hemoglobin 10.9 (*)    HCT 34.3 (*)    All other components within normal limits  COMPREHENSIVE METABOLIC PANEL - Abnormal; Notable for the following:    Glucose, Bld 101 (*)    Albumin 3.4 (*)    AST 254 (*)    ALT 79 (*)    Alkaline Phosphatase 197 (*)    GFR calc non Af Amer 67 (*)    GFR calc Af Amer 78 (*)    All other components within normal limits  ACETAMINOPHEN LEVEL - Abnormal; Notable for the following:    Acetaminophen (Tylenol), Serum <10.0 (*)    All other components within normal limits  URINE CULTURE  LIPASE, BLOOD  ETHANOL  POC OCCULT BLOOD, ED    Imaging Review Dg Lumbar Spine Complete  08/20/2014   CLINICAL DATA:  Abdominal/back pain  EXAM: LUMBAR SPINE - COMPLETE 4+ VIEW  COMPARISON:  CT abdomen pelvis dated 07/11/2014  FINDINGS: Vestigial ribs at T12.   Five lumbar type vertebral bodies.  Lower lumbar levoscoliosis.  Exaggerated lumbar lordosis.  No evidence of fracture or dislocation. Vertebral body heights are maintained.  Moderate to severe multilevel degenerative changes.  Prior sacroplasty.  Visualized bony pelvis is intact.  Prior cholecystectomy clips.  IMPRESSION: Moderate to severe multilevel degenerative changes with lumbar levoscoliosis.  No fracture or dislocation is seen.   Electronically Signed   By: Julian Hy M.D.   On: 08/20/2014 14:17   Ct Abdomen Pelvis W Contrast  08/20/2014   CLINICAL DATA:  Lower abdominal pain.  EXAM: CT ABDOMEN AND PELVIS WITH CONTRAST  TECHNIQUE: Multidetector CT imaging of the abdomen and pelvis was performed using the standard protocol following bolus administration of intravenous contrast.  CONTRAST:  10mL OMNIPAQUE IOHEXOL 300 MG/ML SOLN, 161mL OMNIPAQUE IOHEXOL 300 MG/ML SOLN  COMPARISON:  CT scan of July 11, 2014.  FINDINGS: Severe multilevel degenerative disc disease is noted in the lumbar spine. Visualized lung bases appear normal.  Status post cholecystectomy. Stable intrahepatic and extrahepatic biliary dilatation is noted compared to prior exam, most consistent with post cholecystectomy status. The spleen and pancreas appear normal. Adrenal glands appear normal. Stable calcifications are noted in right hepatic lobe posteriorly. No hydronephrosis or renal obstruction is noted. Symmetric enhancement both kidneys are noted. Bilateral nonobstructive nephrolithiasis is noted. There is no evidence bowel obstruction. Stool is noted throughout the colon. Atherosclerotic calcifications of abdominal aorta are noted without aneurysm formation. No abnormal fluid collection is noted. Urinary bladder is decompressed. Status post hysterectomy. No significant adenopathy is noted.  IMPRESSION: Stable intrahepatic and extrahepatic biliary dilatation is seen status post cholecystectomy.  Probable bilateral  nonobstructive nephrolithiasis. No hydronephrosis or renal obstruction is noted.  No acute abnormality seen in the abdomen or pelvis.   Electronically Signed   By: Marijo Conception, M.D.   On: 08/20/2014 15:08   Dg Abd Acute W/chest  08/20/2014   CLINICAL DATA:  Abdominal pain and back pain.  EXAM: ACUTE ABDOMEN SERIES (ABDOMEN 2 VIEW & CHEST 1 VIEW)  COMPARISON:  CT abdomen 07/11/2014, chest CT radiograph 07/11/2013  FINDINGS: Normal cardiac silhouette with ectatic aorta. Lungs are hyperinflated.  There is no pneumothorax or focal infiltrate. No free air beneath hemidiaphragms. Scoliosis the spine.  No dilated loops of large or small bowel. Moderate volume stool throughout the colon. Gas the rectum. There is chronic calcifications along the inferior margin of the right hepatic lobe. Kyphoplasty cement in the sacrum.  IMPRESSION: 1. No acute cardiopulmonary findings. 2. Hyperinflated lungs. 3. No bowel obstruction. 4. Rotatory scoliosis with severe degenerative change   Electronically Signed   By: Suzy Bouchard M.D.   On: 08/20/2014 14:19     EKG Interpretation None      MDM   Final diagnoses:  Right-sided low back pain without sciatica  Elevated LFTs   Exacerbation of right-sided low back pain onset after she reached to pick something up yesterday. Denies any weakness, numbness or tingling. Denies any bowel or bladder incontinence. No fever or vomiting. Recent hospitalization for pancreatitis last month. Endorses some lower abdominal soreness over the past several days attributed to constipation.  No evidence of cord compression or cauda equina. Hemoglobin decreased 2 grams. Hemoccult negative. No fecal impaction Elevated AST and ALT. Lipase normal  Abdominal imaging during previous admission showed: Chronic intrahepatic and extrahepatic biliary ductal dilatation noted with CBD measuring 14 mm in diameter; gallbladder absent. US abdomen without obvious mass or stone in bile duct.   CT  shows stable dilation of intrahepatic biliary ducts. No acute findings. No explanation for her elevated LFTs. Acetaminophen level negative. D/w Dr. Arnoldo Morale who is covering for Dr. Gala Romney. Patient has MRCP scheduled next week. Bilirubin and lipase are normal. Dr. Arnoldo Morale advises patient to call Dr. Gala Romney on Monday morning.  Patient feeling better and ambulatory. Her pain is controlled. She has pain medication at home. Advised to call Dr. Gala Romney first thing on Monday morning. Return to ED with worsening pain, fever, vomiting or any other concerns. Discussed elevated LFTs with patient and family and need for close GI followup. No evidence of cholangitis at this time.    I personally performed the services described in this documentation, which was scribed in my presence. The recorded information has been reviewed and is accurate.   Connie Essex, MD 08/20/14 [phone]

## 2014-08-20 NOTE — Discharge Instructions (Signed)
Back Pain, Adult Take your pain medication as scheduled. Follow-up with Dr. Gala Romney next week. As we discussed your liver enzymes are elevated. Return to the ED if you develop new or worsening symptoms.  Low back pain is very common. About 1 in 5 people have back pain.The cause of low back pain is rarely dangerous. The pain often gets better over time.About half of people with a sudden onset of back pain feel better in just 2 weeks. About 8 in 10 people feel better by 6 weeks.  CAUSES Some common causes of back pain include:  Strain of the muscles or ligaments supporting the spine.  Wear and tear (degeneration) of the spinal discs.  Arthritis.  Direct injury to the back. DIAGNOSIS Most of the time, the direct cause of low back pain is not known.However, back pain can be treated effectively even when the exact cause of the pain is unknown.Answering your caregiver's questions about your overall health and symptoms is one of the most accurate ways to make sure the cause of your pain is not dangerous. If your caregiver needs more information, he or she may order lab work or imaging tests (X-rays or MRIs).However, even if imaging tests show changes in your back, this usually does not require surgery. HOME CARE INSTRUCTIONS For many people, back pain returns.Since low back pain is rarely dangerous, it is often a condition that people can learn to [facility] their own.   Remain active. It is stressful on the back to sit or stand in one place. Do not sit, drive, or stand in one place for more than 30 minutes at a time. Take short walks on level surfaces as soon as pain allows.Try to increase the length of time you walk each day.  Do not stay in bed.Resting more than 1 or 2 days can delay your recovery.  Do not avoid exercise or work.Your body is made to move.It is not dangerous to be active, even though your back may hurt.Your back will likely heal faster if you return to being active before  your pain is gone.  Pay attention to your body when you bend and lift. Many people have less discomfortwhen lifting if they bend their knees, keep the load close to their bodies,and avoid twisting. Often, the most comfortable positions are those that put less stress on your recovering back.  Find a comfortable position to sleep. Use a firm mattress and lie on your side with your knees slightly bent. If you lie on your back, put a pillow under your knees.  Only take over-the-counter or prescription medicines as directed by your caregiver. Over-the-counter medicines to reduce pain and inflammation are often the most helpful.Your caregiver may prescribe muscle relaxant drugs.These medicines help dull your pain so you can more quickly return to your normal activities and healthy exercise.  Put ice on the injured area.  Put ice in a plastic bag.  Place a towel between your skin and the bag.  Leave the ice on for 15-20 minutes, 03-04 times a day for the first 2 to 3 days. After that, ice and heat may be alternated to reduce pain and spasms.  Ask your caregiver about trying back exercises and gentle massage. This may be of some benefit.  Avoid feeling anxious or stressed.Stress increases muscle tension and can worsen back pain.It is important to recognize when you are anxious or stressed and learn ways to manage it.Exercise is a great option. SEEK MEDICAL CARE IF:  You have pain  that is not relieved with rest or medicine.  You have pain that does not improve in 1 week.  You have new symptoms.  You are generally not feeling well. SEEK IMMEDIATE MEDICAL CARE IF:   You have pain that radiates from your back into your legs.  You develop new bowel or bladder control problems.  You have unusual weakness or numbness in your arms or legs.  You develop nausea or vomiting.  You develop abdominal pain.  You feel faint. Document Released: 06/10/2005 Document Revised: 12/10/2011 Document  Reviewed: 10/12/2013 [facility] Patient Information 2015 [place], [place]. This information is not intended to replace advice given to you by your health care provider. Make sure you discuss any questions you have with your health care provider.

## 2014-08-20 NOTE — ED Notes (Signed)
Patient c/o low back pain. Per patient bent over to pick something up yesterday when pain started in back. Back brace on. Patient also c/o lower abd pain and swelling in lower extremities. Patient recently diagnosed with pancreatitis. Denies any nausea, vomiting, diarrhea, or fevers. Does report urinary frequency.

## 2014-08-22 ENCOUNTER — Encounter: Payer: Self-pay | Admitting: Gastroenterology

## 2014-08-22 NOTE — Telephone Encounter (Signed)
Letter faxed.

## 2014-08-22 NOTE — Telephone Encounter (Signed)
See letter completed in "letters". See above.

## 2014-08-23 LAB — URINE CULTURE: Colony Count: >=100000

## 2014-08-23 NOTE — Telephone Encounter (Signed)
Josie from [facility] called office to verify pt and office information. She states that we should have an answer within 72 hours.

## 2014-08-24 ENCOUNTER — Ambulatory Visit (INDEPENDENT_AMBULATORY_CARE_PROVIDER_SITE_OTHER): Payer: Medicare Other | Admitting: Nurse Practitioner

## 2014-08-24 ENCOUNTER — Encounter: Payer: Self-pay | Admitting: Nurse Practitioner

## 2014-08-24 ENCOUNTER — Telehealth (HOSPITAL_BASED_OUTPATIENT_CLINIC_OR_DEPARTMENT_OTHER): Payer: Self-pay | Admitting: Emergency Medicine

## 2014-08-24 VITALS — BP 186/107 | HR 106 | Temp 97.5°F | Ht 61.0 in | Wt 141.6 lb

## 2014-08-24 DIAGNOSIS — K838 Other specified diseases of biliary tract: Secondary | ICD-10-CM | POA: Insufficient documentation

## 2014-08-24 DIAGNOSIS — K859 Acute pancreatitis, unspecified: Secondary | ICD-10-CM

## 2014-08-24 NOTE — Telephone Encounter (Signed)
Post ED Visit - Positive Culture Follow-up  Culture report reviewed by antimicrobial stewardship pharmacist: []  Wes Craig, Pharm.D., BCPS [x]  Heide Guile, Pharm.D., BCPS []  Alycia Rossetti, Pharm.D., BCPS []  Gilbert, Pharm.D., BCPS, AAHIVP []  Legrand Como, Pharm.D., BCPS, AAHIVP []  Isac Sarna, Pharm.D., BCPS  Positive Urine culture Plan - no treatment Al Corpus PA  Georgina Peer [facility] 08/24/2014, 5:34 PM

## 2014-08-24 NOTE — Progress Notes (Signed)
cc'ed to pcp °

## 2014-08-24 NOTE — Patient Instructions (Signed)
1. We will schedule your MRI today for sometime soon 2. We will call you with the results 3. Follow-up in 1-2 months.

## 2014-08-24 NOTE — Progress Notes (Signed)
Referring Provider: Lanette Hampshire, MD Primary Care Physician:  Lanette Hampshire, MD Primary GI:  Dr. Gala Romney  Chief Complaint  Patient presents with  . Follow-up    HPI:   74 year old female presents for follow-up on hospitalization for pancreatitis of unknown etiology. On admission had elevated lipase of 282, alk phos 214, ALT 82. CT showed pancreatitis.  Previous episodes of elevated lipase noted in 2014, with CT at that time showing no clear evidence of acute pancreatitis but intrahepatic and extrahepatic biliary duct dilation and CBD dilation. CBD measured 61m in 2014, 14 mm on CT during hospitalization. S/p cholecystectomy. ERCP in 2003 secondary to choledocholithiasis, s/p sphincterotomy and stone removal. Progressed well during hospital stay. Was planned for outpatient MRI/MRCP for dedicated biliary imaging and further evaluate CBD dilation.  Today states she's not feeling well, has chronic pain including her back. Is having some LE edema with new BP medicine. Has not been back to Dr. MEmilee Heroyet to address this. States she has occasional abdominal pain 1-2 times a week and only lasts a few minutes, generalized abdominal location, described as sharp and crampy. Denies N/V. Denies diarrhea. Is unable to quantify frequency of bowel movement but complains of constipation. Is taking Miralax last week which has helped. Denies hematochezia and melena. Denies diarrhea. Denies unexplained fever/chills, unintentional weight loss. Denies any other upper or lower GI symptoms.   Past Medical History  Diagnosis Date  . Hx of degenerative disc disease   . Hypertension   . Hypercholesterolemia   . Stroke   . Migraines   . Hypercholesteremia 02/03/2014  . Cat scratch of right forearm 02/03/2014  . Hemorrhoid 04/06/2014    Past Surgical History  Procedure Laterality Date  . Cholecystectomy    . Abdominal hysterectomy    . Breast surgery    . Appendectomy    . Tonsillectomy    .  Bilateral salpingoophorectomy    . Anterior and posterior repair    . Ercp with sphincterotomy  2003    Dr. RGala Romney balloon dredging of bile duct, normal ampulla, choledocholithiasis   . Colonoscopy  2003    Dr. RGala Romney normal rectum, scattered diverticula    Current Outpatient Prescriptions  Medication Sig Dispense Refill  . amLODipine (NORVASC) 10 MG tablet Take 1 tablet (10 mg total) by mouth daily. 30 tablet 5  . AZOR 10-40 MG per tablet Take 1 tablet by mouth daily.    . butalbital-acetaminophen-caffeine (FIORICET WITH CODEINE) 50-325-40-30 MG per capsule Take 1 capsule by mouth every 6 (six) hours as needed (for back pain).     . clorazepate (TRANXENE) 15 MG tablet Take 7.5 mg by mouth daily.     .Marland Kitchenesomeprazole (NEXIUM) 40 MG capsule Take 1 capsule (40 mg total) by mouth daily. 20 capsule 0  . hydrALAZINE (APRESOLINE) 25 MG tablet Take 1 tablet (25 mg total) by mouth 2 (two) times daily. 60 tablet 5  . lactulose (CHRONULAC) 10 GM/15ML solution Take 10 g by mouth daily as needed for mild constipation.    .Marland KitchenNAFTIN 2 % CREA     . ofloxacin (OCUFLOX) 0.3 % ophthalmic solution Place 2 drops into the right ear daily as needed (for pain). 2 drops in right ear daily prn.    . OxyCODONE (OXYCONTIN) 40 mg T12A 12 hr tablet Take 40 mg by mouth every 12 (twelve) hours.    . pramoxine-hydrocortisone (ANALPRAM HC) cream Apply topically 3 (three) times daily. (Patient taking differently: Apply 1  application topically daily as needed (for hemorrhoid pain). ) 30 g 1  . venlafaxine XR (EFFEXOR-XR) 150 MG 24 hr capsule Take 300 mg by mouth daily.      No current facility-administered medications for this visit.    Allergies as of 08/24/2014 - Review Complete 08/24/2014  Allergen Reaction Noted  . Adhesive [tape] Other (See Comments) 07/11/2014  . Latex Itching and Rash 07/11/2014    Family History  Problem Relation Age of Onset  . Heart disease Mother   . Cancer Father     liver  . Cancer Son     . Heart disease Son   . Cancer Maternal Grandmother   . Colon cancer Neg Hx     History   Social History  . Marital Status: Widowed    Spouse Name: N/A  . Number of Children: N/A  . Years of Education: N/A   Social History Main Topics  . Smoking status: Current Every Day Smoker -- 1.00 packs/day for 50 years    Types: Cigarettes  . Smokeless tobacco: Never Used  . Alcohol Use: No  . Drug Use: No  . Sexual Activity: No   Other Topics Concern  . None   Social History Narrative    Review of Systems: Gen: Denies fever, chills, anorexia. Denies unintentional weight loss.  CV: Denies chest pain, palpitations, syncope. Resp: Denies dyspnea at rest, wheezing. GI: See HPI. Denies vomiting blood, jaundice, and fecal incontinence. Derm: Denies rash, itching, dry skin Psych: Denies depression, anxiety, memory loss, confusion. No homicidal or suicidal ideation.  Heme: Denies bruising, bleeding, and enlarged lymph nodes.  Physical Exam: BP 186/107 mmHg  Pulse 106  Temp(Src) 97.5 F (36.4 C)  Ht 5' 1"  (1.549 m)  Wt 141 lb 9.6 oz (64.229 kg)  BMI 26.77 kg/m2 General:   Alert and oriented. No distress noted. Pleasant and cooperative.  Head:  Normocephalic and atraumatic. Lungs:  Clear to auscultation bilaterally. No wheezes, rales, or rhonchi. No distress.  Heart:  S1, S2 present without murmurs, rubs, or gallops. Regular rate and rhythm. Abdomen:  +BS, soft, and non-distended. Mild discomfort to palpation of generalized abdomen. No rebound or guarding. No HSM or masses noted. Msk:  Symmetrical without gross deformities. Normal posture. Extremities:  Bilateral non-pitting edema noted. Neurologic:  Alert and  oriented x4;  grossly normal neurologically. Skin:  Intact without significant lesions or rashes. Psych:  Alert and cooperative. Normal mood and affect.    08/24/2014 1:27 PM

## 2014-08-24 NOTE — Assessment & Plan Note (Signed)
Symptoms improved, no more acute abdominal pain, occasional brief episodic instances of abdominal pain. No other warning signs. Prior auth just recently received for MRI/MRCP and will proceed with this for dedicated biliary imaging to further evaluate episodic elevated LFTs and dilated CBD. S/P cholecystectomy. Return for re-evaluation in 6-8 weeks.

## 2014-08-24 NOTE — Telephone Encounter (Signed)
Pt's insurances approved for her to have the MRI. The [phone]. We will schedule her today when she comes in for her office visit.

## 2014-08-30 ENCOUNTER — Ambulatory Visit: Payer: Medicare Other | Admitting: Gastroenterology

## 2014-09-01 ENCOUNTER — Other Ambulatory Visit: Payer: Self-pay | Admitting: Gastroenterology

## 2014-09-01 ENCOUNTER — Ambulatory Visit (HOSPITAL_COMMUNITY)
Admission: RE | Admit: 2014-09-01 | Discharge: 2014-09-01 | Disposition: A | Discharge status: Home / Self Care | Payer: Medicare Other | Source: Ambulatory Visit | Attending: Gastroenterology | Admitting: Gastroenterology

## 2014-09-01 DIAGNOSIS — Z8719 Personal history of other diseases of the digestive system: Secondary | ICD-10-CM

## 2014-09-01 DIAGNOSIS — K859 Acute pancreatitis, unspecified: Secondary | ICD-10-CM | POA: Insufficient documentation

## 2014-09-01 DIAGNOSIS — R109 Unspecified abdominal pain: Secondary | ICD-10-CM | POA: Insufficient documentation

## 2014-09-01 DIAGNOSIS — M549 Dorsalgia, unspecified: Secondary | ICD-10-CM | POA: Diagnosis present

## 2014-09-01 MED ORDER — GADOBENATE DIMEGLUMINE 529 MG/ML IV SOLN
13.0000 mL | Freq: Once | INTRAVENOUS | Status: AC | PRN
  Administered 2014-09-01: 13 mL via INTRAVENOUS

## 2014-09-05 ENCOUNTER — Telehealth: Payer: Self-pay

## 2014-09-05 ENCOUNTER — Other Ambulatory Visit: Payer: Self-pay

## 2014-09-05 DIAGNOSIS — R935 Abnormal findings on diagnostic imaging of other abdominal regions, including retroperitoneum: Secondary | ICD-10-CM

## 2014-09-05 NOTE — Telephone Encounter (Signed)
MRI/MRCP reviewed. It appears that there is an abnormal appearances at the pancreatic head. With her history of pancreatitis and persisting abnormality, I recommend an EUS with FNA biopsy (if possible) with Dr. Ardis Hughs.

## 2014-09-05 NOTE — Telephone Encounter (Signed)
REFERRAL HAS BEEN MADE  

## 2014-09-05 NOTE — Telephone Encounter (Signed)
PT IS CALLING FOR RESULTS FROM MRI. I TOLD HER THAT IT WOULD TAKE 7-10 DAYS. SHE IS NOT FEELING GOOD. PLEASE ADVISE

## 2014-09-05 NOTE — Progress Notes (Signed)
Quick Note:  MRI/MRCP reviewed. It appears that there is an abnormal appearances at the pancreatic head. With her history of pancreatitis and persisting abnormality, I recommend an EUS with FNA biopsy (if possible) with Dr. Ardis Hughs. ______

## 2014-09-06 NOTE — Telephone Encounter (Signed)
Pt is aware of results and referral.  

## 2014-09-28 DIAGNOSIS — F32A Depression, unspecified: Secondary | ICD-10-CM | POA: Insufficient documentation

## 2014-09-28 DIAGNOSIS — G8929 Other chronic pain: Secondary | ICD-10-CM | POA: Insufficient documentation

## 2014-09-28 DIAGNOSIS — Z720 Tobacco use: Secondary | ICD-10-CM | POA: Insufficient documentation

## 2014-09-28 DIAGNOSIS — F419 Anxiety disorder, unspecified: Secondary | ICD-10-CM | POA: Insufficient documentation

## 2014-09-28 DIAGNOSIS — K219 Gastro-esophageal reflux disease without esophagitis: Secondary | ICD-10-CM | POA: Insufficient documentation

## 2014-10-04 ENCOUNTER — Ambulatory Visit: Payer: Medicare Other | Admitting: Gastroenterology

## 2014-10-07 ENCOUNTER — Ambulatory Visit: Payer: Medicare Other | Admitting: Internal Medicine

## 2014-10-20 ENCOUNTER — Telehealth: Payer: Self-pay | Admitting: Internal Medicine

## 2014-10-20 NOTE — Telephone Encounter (Signed)
Pt cancelled her OV with [facility] for 10/07/14 due to having a migraine. She calls today asking if she should reschedule her OV. I told her that would be up to her. She said that no one has followed up with her and has not mentioned her having pancreatitis. I offered to make her an OV and she wants to know if she should.  She has a lot of unanswered questions that need to be addressed. I offered for her to speak with [facility] nurse and she agreed to do that before scheduling OV. Please call her [phone]

## 2014-10-20 NOTE — Telephone Encounter (Signed)
Review outside records when I return to the office next week

## 2014-10-20 NOTE — Telephone Encounter (Signed)
I spoke with the pt. She had her EUS done with Dr.Evans at [facility] on 10/04/14. I have printed those results and he stated in his notes that her biopsy was normal. Procedure note and bx results are on [facility] desk. She is doing ok right now, no severe abd pain at this time. She cancelled her last appt d/t a migraine.   Pt wants to know if she needs to follow up with [place]?  and She wants to know what kind of diet she needs to be on to prevent pancreatitis?

## 2014-10-21 NOTE — Telephone Encounter (Signed)
Pt is aware.  

## 2014-10-25 ENCOUNTER — Ambulatory Visit: Payer: Medicare Other | Admitting: Nurse Practitioner

## 2014-10-27 NOTE — Telephone Encounter (Signed)
Open in error

## 2014-10-28 ENCOUNTER — Telehealth: Payer: Self-pay

## 2014-10-28 NOTE — Telephone Encounter (Signed)
Per Dr. Gala Romney, pt will need Pancreatic Protocol CT in July 2016. Routing to [place] to nic. EUS done at [facility] on 10/04/2014. Operative procedure report scanned in to epic.

## 2014-11-30 ENCOUNTER — Encounter (HOSPITAL_COMMUNITY): Payer: Self-pay | Admitting: Emergency Medicine

## 2014-11-30 ENCOUNTER — Emergency Department (HOSPITAL_COMMUNITY)
Admission: EM | Admit: 2014-11-30 | Discharge: 2014-11-30 | Disposition: A | Discharge status: Home / Self Care | Payer: Medicare Other | Attending: Emergency Medicine | Admitting: Emergency Medicine

## 2014-11-30 DIAGNOSIS — R197 Diarrhea, unspecified: Secondary | ICD-10-CM | POA: Diagnosis not present

## 2014-11-30 DIAGNOSIS — G43909 Migraine, unspecified, not intractable, without status migrainosus: Secondary | ICD-10-CM | POA: Insufficient documentation

## 2014-11-30 DIAGNOSIS — N390 Urinary tract infection, site not specified: Secondary | ICD-10-CM | POA: Diagnosis not present

## 2014-11-30 DIAGNOSIS — Z8719 Personal history of other diseases of the digestive system: Secondary | ICD-10-CM | POA: Insufficient documentation

## 2014-11-30 DIAGNOSIS — Z87828 Personal history of other (healed) physical injury and trauma: Secondary | ICD-10-CM | POA: Insufficient documentation

## 2014-11-30 DIAGNOSIS — Z9049 Acquired absence of other specified parts of digestive tract: Secondary | ICD-10-CM | POA: Diagnosis not present

## 2014-11-30 DIAGNOSIS — Z79899 Other long term (current) drug therapy: Secondary | ICD-10-CM | POA: Diagnosis not present

## 2014-11-30 DIAGNOSIS — Z720 Tobacco use: Secondary | ICD-10-CM | POA: Insufficient documentation

## 2014-11-30 DIAGNOSIS — Z91040 Latex allergy status: Secondary | ICD-10-CM | POA: Diagnosis not present

## 2014-11-30 DIAGNOSIS — I10 Essential (primary) hypertension: Secondary | ICD-10-CM | POA: Insufficient documentation

## 2014-11-30 DIAGNOSIS — E876 Hypokalemia: Secondary | ICD-10-CM | POA: Insufficient documentation

## 2014-11-30 DIAGNOSIS — R531 Weakness: Secondary | ICD-10-CM | POA: Diagnosis present

## 2014-11-30 DIAGNOSIS — Z8673 Personal history of transient ischemic attack (TIA), and cerebral infarction without residual deficits: Secondary | ICD-10-CM | POA: Insufficient documentation

## 2014-11-30 DIAGNOSIS — Z8739 Personal history of other diseases of the musculoskeletal system and connective tissue: Secondary | ICD-10-CM | POA: Diagnosis not present

## 2014-11-30 LAB — CBC WITH DIFFERENTIAL/PLATELET
BASOS PCT: 1 % (ref 0–1)
Basophils Absolute: 0.1 10*3/uL (ref 0.0–0.1)
EOS ABS: 0.1 10*3/uL (ref 0.0–0.7)
Eosinophils Relative: 1 % (ref 0–5)
HEMATOCRIT: 44.2 % (ref 36.0–46.0)
Hemoglobin: 14.7 g/dL (ref 12.0–15.0)
Lymphocytes Relative: 23 % (ref 12–46)
Lymphs Abs: 2.1 10*3/uL (ref 0.7–4.0)
MCH: 31.8 pg (ref 26.0–34.0)
MCHC: 33.3 g/dL (ref 30.0–36.0)
MCV: 95.7 fL (ref 78.0–100.0)
MONO ABS: 0.6 10*3/uL (ref 0.1–1.0)
MONOS PCT: 6 % (ref 3–12)
NEUTROS ABS: 6.3 10*3/uL (ref 1.7–7.7)
Neutrophils Relative %: 69 % (ref 43–77)
PLATELETS: 249 10*3/uL (ref 150–400)
RBC: 4.62 MIL/uL (ref 3.87–5.11)
RDW: 13.1 % (ref 11.5–15.5)
WBC: 9.2 10*3/uL (ref 4.0–10.5)

## 2014-11-30 LAB — URINE MICROSCOPIC-ADD ON

## 2014-11-30 LAB — URINALYSIS, ROUTINE W REFLEX MICROSCOPIC
Bilirubin Urine: NEGATIVE
GLUCOSE, UA: NEGATIVE mg/dL
Ketones, ur: NEGATIVE mg/dL
NITRITE: NEGATIVE
PH: 6.5 (ref 5.0–8.0)
Protein, ur: 30 mg/dL — AB
Specific Gravity, Urine: 1.02 (ref 1.005–1.030)
Urobilinogen, UA: 0.2 mg/dL (ref 0.0–1.0)

## 2014-11-30 LAB — BASIC METABOLIC PANEL
ANION GAP: 10 (ref 5–15)
BUN: 19 mg/dL (ref 6–20)
CO2: 28 mmol/L (ref 22–32)
Calcium: 9.3 mg/dL (ref 8.9–10.3)
Chloride: 102 mmol/L (ref 101–111)
Creatinine, Ser: 1.05 mg/dL — ABNORMAL HIGH (ref 0.44–1.00)
GFR calc Af Amer: 60 mL/min — ABNORMAL LOW (ref >60)
GFR, EST NON AFRICAN AMERICAN: 51 mL/min — AB (ref >60)
Glucose, Bld: 116 mg/dL — ABNORMAL HIGH (ref 65–99)
Potassium: 3 mmol/L — ABNORMAL LOW (ref 3.5–5.1)
SODIUM: 140 mmol/L (ref 135–145)

## 2014-11-30 MED ORDER — SODIUM CHLORIDE 0.9 % IV BOLUS (SEPSIS)
500.0000 mL | Freq: Once | INTRAVENOUS | Status: AC
  Administered 2014-11-30: 500 mL via INTRAVENOUS

## 2014-11-30 MED ORDER — SULFAMETHOXAZOLE-TRIMETHOPRIM 800-160 MG PO TABS: 1.0000 | ORAL_TABLET | Freq: Two times a day (BID) | ORAL | Status: AC

## 2014-11-30 MED ORDER — OXYCODONE-ACETAMINOPHEN 5-325 MG PO TABS
1.0000 | ORAL_TABLET | Freq: Once | ORAL | Status: AC
  Administered 2014-11-30: 1 via ORAL
  Filled 2014-11-30: qty 1

## 2014-11-30 MED ORDER — IRBESARTAN 300 MG PO TABS
300.0000 mg | ORAL_TABLET | Freq: Every day | ORAL | Status: DC
  Administered 2014-11-30: 300 mg via ORAL
  Filled 2014-11-30 (×2): qty 1

## 2014-11-30 MED ORDER — AMLODIPINE BESYLATE 5 MG PO TABS
10.0000 mg | ORAL_TABLET | Freq: Once | ORAL | Status: AC
  Administered 2014-11-30: 10 mg via ORAL
  Filled 2014-11-30: qty 2

## 2014-11-30 MED ORDER — LOPERAMIDE HCL 2 MG PO CAPS
4.0000 mg | ORAL_CAPSULE | Freq: Once | ORAL | Status: AC
  Administered 2014-11-30: 4 mg via ORAL
  Filled 2014-11-30: qty 2

## 2014-11-30 MED ORDER — POTASSIUM CHLORIDE 20 MEQ PO PACK
40.0000 meq | PACK | Freq: Once | ORAL | Status: AC
  Administered 2014-11-30: 40 meq via ORAL
  Filled 2014-11-30: qty 2

## 2014-11-30 MED ORDER — OXYCODONE-ACETAMINOPHEN 5-325 MG PO TABS: 1.0000 | ORAL_TABLET | ORAL | Status: DC | PRN

## 2014-11-30 MED ORDER — LOPERAMIDE HCL 2 MG PO CAPS: 2.0000 mg | ORAL_CAPSULE | Freq: Four times a day (QID) | ORAL | Status: DC | PRN

## 2014-11-30 NOTE — ED Provider Notes (Signed)
Patient presented to the ER with generalized weakness and diarrhea. She is not experiencing any abdominal pain associated with the symptoms. No rectal bleeding or melanotic  Face to face Exam: HEENT - PERRLA Lungs - CTAB Heart - RRR, no M/R/G Abd - S/NT/ND Neuro - alert, oriented x3  Plan: Patient with generalized weakness. She has been experiencing diarrhea, was found to have mild hypokalemia. She was given potassium. She was given IV fluids for some mild dehydration. Patient will be discharged, follow-up with primary doctor.  Orpah Greek, MD 11/30/14 [phone]

## 2014-11-30 NOTE — ED Provider Notes (Signed)
CSN: [id number]     Arrival date & time 11/30/14  1102 History   First MD Initiated Contact with Patient 11/30/14 1110     Chief Complaint  Patient presents with  . Extremity Weakness     (Consider location/radiation/quality/duration/timing/severity/associated sxs/prior Treatment) The history is provided by the patient.   Connie Wood is a 74 y.o. female with a medical history significant for htn, cva and chronic back pain from DDD on bid oxycontin,  presenting with a 4 day history of non bloody diarrhea and generalized weakness. She reports any solid food she consumes triggers a watery brown bowel movement. She can tolerate fluids, stating loves iced tea and has been drinking this beverage but feels weak and dehydrated. She denies fevers, chills, dizziness, abdominal pain, nausea or vomiting.  She has taken no medications for the diarrhea and also missed her morning medications today including her blood pressure medicine Azor. Her blood pressure is elevated at first presentation here. She denies headache, chest pain or shortness of breath.     Past Medical History  Diagnosis Date  . Hx of degenerative disc disease   . Hypertension   . Hypercholesterolemia   . Stroke   . Migraines   . Hypercholesteremia 02/03/2014  . Cat scratch of right forearm 02/03/2014  . Hemorrhoid 04/06/2014   Past Surgical History  Procedure Laterality Date  . Cholecystectomy    . Abdominal hysterectomy    . Breast surgery    . Appendectomy    . Tonsillectomy    . Bilateral salpingoophorectomy    . Anterior and posterior repair    . Ercp with sphincterotomy  2003    Dr. Gala Romney: balloon dredging of bile duct, normal ampulla, choledocholithiasis   . Colonoscopy  2003    Dr. Gala Romney: normal rectum, scattered diverticula   Family History  Problem Relation Age of Onset  . Heart disease Mother   . Cancer Father     liver  . Cancer Son   . Heart disease Son   . Cancer Maternal Grandmother   .  Colon cancer Neg Hx    History  Substance Use Topics  . Smoking status: Current Every Day Smoker -- 1.00 packs/day for 50 years    Types: Cigarettes  . Smokeless tobacco: Never Used  . Alcohol Use: No   OB History    Gravida Para Term Preterm AB TAB SAB Ectopic Multiple Living   5 3   2  2   3      Review of Systems  Constitutional: Positive for fatigue. Negative for fever and chills.  HENT: Negative for congestion and sore throat.   Eyes: Negative.   Respiratory: Negative for chest tightness and shortness of breath.   Cardiovascular: Negative for chest pain.  Gastrointestinal: Positive for diarrhea. Negative for nausea, vomiting and abdominal pain.  Genitourinary: Negative.   Musculoskeletal: Negative for joint swelling, arthralgias and neck pain.  Skin: Negative.  Negative for rash and wound.  Neurological: Positive for weakness. Negative for dizziness, light-headedness, numbness and headaches.  Psychiatric/Behavioral: Negative.       Allergies  Adhesive and Latex  Home Medications   Prior to Admission medications   Medication Sig Start Date End Date Taking? Authorizing Provider  AZOR 10-40 MG per tablet Take 1 tablet by mouth daily. 07/06/13  Yes Historical Provider, MD  butalbital-acetaminophen-caffeine (FIORICET WITH CODEINE) 50-325-40-30 MG per capsule Take 1 capsule by mouth every 6 (six) hours as needed for headache (for back pain).  07/02/13  Yes Historical Provider, MD  clorazepate (TRANXENE) 15 MG tablet Take 7.5 mg by mouth daily.  07/01/13  Yes Historical Provider, MD  esomeprazole (NEXIUM) 40 MG capsule Take 1 capsule (40 mg total) by mouth daily. 10/28/12  Yes Milton Ferguson, MD  hydrOXYzine (ATARAX/VISTARIL) 25 MG tablet Take 50-100 mg by mouth every 4 (four) hours as needed for itching.   Yes Historical Provider, MD  ibuprofen (ADVIL,MOTRIN) 200 MG tablet Take 400 mg by mouth every 6 (six) hours as needed for headache or moderate pain.   Yes Historical Provider, MD   lactulose (CHRONULAC) 10 GM/15ML solution Take 10 g by mouth daily as needed for mild constipation.   Yes Historical Provider, MD  LORazepam (ATIVAN) 1 MG tablet Take 1-1.5 tablets by mouth 3 times/day as needed-between meals & bedtime for sleep.  11/23/14  Yes Historical Provider, MD  ofloxacin (OCUFLOX) 0.3 % ophthalmic solution Place 2 drops into the right ear daily as needed (for pain). 2 drops in right ear daily prn. 01/14/14  Yes Historical Provider, MD  OxyCODONE (OXYCONTIN) 40 mg T12A 12 hr tablet Take 40 mg by mouth every 12 (twelve) hours.   Yes Historical Provider, MD  venlafaxine XR (EFFEXOR-XR) 150 MG 24 hr capsule Take 150 mg by mouth daily.  07/03/13  Yes Historical Provider, MD  loperamide (IMODIUM) 2 MG capsule Take 1 capsule (2 mg total) by mouth 4 (four) times daily as needed for diarrhea or loose stools. 11/30/14   Evalee Jefferson, PA-C  oxyCODONE-acetaminophen (PERCOCET/ROXICET) 5-325 MG per tablet Take 1 tablet by mouth every 4 (four) hours as needed. 11/30/14   Evalee Jefferson, PA-C  pramoxine-hydrocortisone (ANALPRAM HC) cream Apply topically 3 (three) times daily. Patient not taking: Reported on 11/30/2014 04/06/14   Estill Dooms, NP  sulfamethoxazole-trimethoprim (BACTRIM DS,SEPTRA DS) 800-160 MG per tablet Take 1 tablet by mouth 2 (two) times daily. 11/30/14 12/07/14  Evalee Jefferson, PA-C   BP 162/91 mmHg  Pulse 89  Temp(Src) 98.7 F (37.1 C) (Oral)  Resp 20  Ht 5\' 1"  (1.549 m)  Wt 128 lb (58.06 kg)  BMI 24.20 kg/m2  SpO2 94% Physical Exam  Constitutional: She appears well-developed and well-nourished.  HENT:  Head: Normocephalic and atraumatic.  Mouth/Throat: Mucous membranes are not pale and dry.  Eyes: Conjunctivae are normal.  Neck: Normal range of motion.  Cardiovascular: Normal rate, regular rhythm, normal heart sounds and intact distal pulses.   Pulmonary/Chest: Effort normal and breath sounds normal. She has no decreased breath sounds. She has no wheezes. She has no  rhonchi.  Abdominal: Soft. Bowel sounds are normal. There is no tenderness. There is no rigidity and no guarding.  Musculoskeletal: Normal range of motion. She exhibits no edema.  No ankle edema  Neurological: She is alert.  Skin: Skin is warm and dry.  Psychiatric: She has a normal mood and affect.  Nursing note and vitals reviewed.   ED Course  Procedures (including critical care time) Labs Review Labs Reviewed  BASIC METABOLIC PANEL - Abnormal; Notable for the following:    Potassium 3.0 (*)    Glucose, Bld 116 (*)    Creatinine, Ser 1.05 (*)    GFR calc non Af Amer 51 (*)    GFR calc Af Amer 60 (*)    All other components within normal limits  URINALYSIS, ROUTINE W REFLEX MICROSCOPIC (NOT AT [facility]) - Abnormal; Notable for the following:    Hgb urine dipstick MODERATE (*)    Protein, ur 30 (*)  Leukocytes, UA SMALL (*)    All other components within normal limits  URINE MICROSCOPIC-ADD ON - Abnormal; Notable for the following:    Squamous Epithelial / LPF MANY (*)    Bacteria, UA MANY (*)    All other components within normal limits  URINE CULTURE  CBC WITH DIFFERENTIAL/PLATELET    Imaging Review No results found.   EKG Interpretation None      MDM   Final diagnoses:  Diarrhea  Hypokalemia  UTI (lower urinary tract infection)    Patients labs and/or radiological studies were reviewed and considered during the medical decision making and disposition process.  Results were also discussed with patient. Pt was also seen by Dr. Betsey Holiday during this visit.   Pt with benign seeming diarrhea, no bm's during todays visit. She was given small NS bolus. Potassium supplemented PO, also gave equivalent of her missed azor with improved bp prior to dc home.  At time of dc, she had complaint of headache and states she ran out of her oxycontin yesterday, reporting cannot fill this med until tomorrow. Will give #6 oxycodone script, encouraged f/u with pcp prn regarding this  issue.  Her urine was equivocal for uti, culture sent. Placed on bactrim. Imodium given along with script for persistent sx.  She has potassium tabs for prn use (when she uses her lasix prn).  Advised to take daily for the next 7 days.  The patient appears reasonably screened and/or stabilized for discharge and I doubt any other medical condition or other [facility] requiring further screening, evaluation, or treatment in the ED at this time prior to discharge.     Evalee Jefferson, PA-C 12/01/14 9169  Orpah Greek, MD 12/01/14 1504

## 2014-11-30 NOTE — Discharge Instructions (Signed)
Diarrhea Diarrhea is frequent loose and watery bowel movements. It can cause you to feel weak and dehydrated. Dehydration can cause you to become tired and thirsty, have a dry mouth, and have decreased urination that often is dark yellow. Diarrhea is a sign of another problem, most often an infection that will not last long. In most cases, diarrhea typically lasts 2-3 days. However, it can last longer if it is a sign of something more serious. It is important to treat your diarrhea as directed by your caregiver to lessen or prevent future episodes of diarrhea. CAUSES  Some common causes include:  Gastrointestinal infections caused by viruses, bacteria, or parasites.  Food poisoning or food allergies.  Certain medicines, such as antibiotics, chemotherapy, and laxatives.  Artificial sweeteners and fructose.  Digestive disorders. HOME CARE INSTRUCTIONS  Ensure adequate fluid intake (hydration): Have 1 cup (8 oz) of fluid for each diarrhea episode. Avoid fluids that contain simple sugars or sports drinks, fruit juices, whole milk products, and sodas. Your urine should be clear or pale yellow if you are drinking enough fluids. Hydrate with an oral rehydration solution that you can purchase at pharmacies, retail stores, and online. You can prepare an oral rehydration solution at home by mixing the following ingredients together:   - tsp table salt.   tsp baking soda.   tsp salt substitute containing potassium chloride.  1  tablespoons sugar.  1 L (34 oz) of water.  Certain foods and beverages may increase the speed at which food moves through the gastrointestinal (GI) tract. These foods and beverages should be avoided and include:  Caffeinated and alcoholic beverages.  High-fiber foods, such as raw fruits and vegetables, nuts, seeds, and whole grain breads and cereals.  Foods and beverages sweetened with sugar alcohols, such as xylitol, sorbitol, and mannitol.  Some foods may be well  tolerated and may help thicken stool including:  Starchy foods, such as rice, toast, pasta, low-sugar cereal, oatmeal, grits, baked potatoes, crackers, and bagels.  Bananas.  Applesauce.  Add probiotic-rich foods to help increase healthy bacteria in the GI tract, such as yogurt and fermented milk products.  Wash your hands well after each diarrhea episode.  Only take over-the-counter or prescription medicines as directed by your caregiver.  Take a warm bath to relieve any burning or pain from frequent diarrhea episodes. SEEK IMMEDIATE MEDICAL CARE IF:   You are unable to keep fluids down.  You have persistent vomiting.  You have blood in your stool, or your stools are black and tarry.  You do not urinate in 6-8 hours, or there is only a small amount of very dark urine.  You have abdominal pain that increases or localizes.  You have weakness, dizziness, confusion, or light-headedness.  You have a severe headache.  Your diarrhea gets worse or does not get better.  You have a fever or persistent symptoms for more than 2-3 days.  You have a fever and your symptoms suddenly get worse. MAKE SURE YOU:   Understand these instructions.  Will watch your condition.  Will get help right away if you are not doing well or get worse. Document Released: 05/31/2002 Document Revised: 10/25/2013 Document Reviewed: 02/16/2012 [facility] Patient Information 2015 [place], [place]. This information is not intended to replace advice given to you by your health care provider. Make sure you discuss any questions you have with your health care provider.  Hypokalemia Hypokalemia means that the amount of potassium in the blood is lower than normal.Potassium is  a chemical, called an electrolyte, that helps regulate the amount of fluid in the body. It also stimulates muscle contraction and helps nerves function properly.Most of the body's potassium is inside of cells, and only a very small amount is  in the blood. Because the amount in the blood is so small, minor changes can be life-threatening. CAUSES  Antibiotics.  Diarrhea or vomiting.  Using laxatives too much, which can cause diarrhea.  Chronic kidney disease.  Water pills (diuretics).  Eating disorders (bulimia).  Low magnesium level.  Sweating a lot. SIGNS AND SYMPTOMS  Weakness.  Constipation.  Fatigue.  Muscle cramps.  Mental confusion.  Skipped heartbeats or irregular heartbeat (palpitations).  Tingling or numbness. DIAGNOSIS  Your health care provider can diagnose hypokalemia with blood tests. In addition to checking your potassium level, your health care provider may also check other lab tests. TREATMENT Hypokalemia can be treated with potassium supplements taken by mouth or adjustments in your current medicines. If your potassium level is very low, you may need to get potassium through a vein (IV) and be monitored in the hospital. A diet high in potassium is also helpful. Foods high in potassium are:  Nuts, such as peanuts and pistachios.  Seeds, such as sunflower seeds and pumpkin seeds.  Peas, lentils, and lima beans.  Whole grain and bran cereals and breads.  Fresh fruit and vegetables, such as apricots, avocado, bananas, cantaloupe, kiwi, oranges, tomatoes, asparagus, and potatoes.  Orange and tomato juices.  Red meats.  Fruit yogurt. HOME CARE INSTRUCTIONS  Take all medicines as prescribed by your health care provider.  Maintain a healthy diet by including nutritious food, such as fruits, vegetables, nuts, whole grains, and lean meats.  If you are taking a laxative, be sure to follow the directions on the label. SEEK MEDICAL CARE IF:  Your weakness gets worse.  You feel your heart pounding or racing.  You are vomiting or having diarrhea.  You are diabetic and having trouble keeping your blood glucose in the normal range. SEEK IMMEDIATE MEDICAL CARE IF:  You have chest pain,  shortness of breath, or dizziness.  You are vomiting or having diarrhea for more than 2 days.  You faint. MAKE SURE YOU:   Understand these instructions.  Will watch your condition.  Will get help right away if you are not doing well or get worse. Document Released: 06/10/2005 Document Revised: 03/31/2013 Document Reviewed: 12/11/2012 [facility] Patient Information 2015 [place], [place]. This information is not intended to replace advice given to you by your health care provider. Make sure you discuss any questions you have with your health care provider.  Urinary Tract Infection Urinary tract infections (UTIs) can develop anywhere along your urinary tract. Your urinary tract is your body's drainage system for removing wastes and extra water. Your urinary tract includes two kidneys, two ureters, a bladder, and a urethra. Your kidneys are a pair of bean-shaped organs. Each kidney is about the size of your fist. They are located below your ribs, one on each side of your spine. CAUSES Infections are caused by microbes, which are microscopic organisms, including fungi, viruses, and bacteria. These organisms are so small that they can only be seen through a microscope. Bacteria are the microbes that most commonly cause UTIs. SYMPTOMS  Symptoms of UTIs may vary by age and gender of the patient and by the location of the infection. Symptoms in young women typically include a frequent and intense urge to urinate and a painful, burning feeling  in the bladder or urethra during urination. Older women and men are more likely to be tired, shaky, and weak and have muscle aches and abdominal pain. A fever may mean the infection is in your kidneys. Other symptoms of a kidney infection include pain in your back or sides below the ribs, nausea, and vomiting. DIAGNOSIS To diagnose a UTI, your caregiver will ask you about your symptoms. Your caregiver also will ask to provide a urine sample. The urine sample will be  tested for bacteria and white blood cells. White blood cells are made by your body to help fight infection. TREATMENT  Typically, UTIs can be treated with medication. Because most UTIs are caused by a bacterial infection, they usually can be treated with the use of antibiotics. The choice of antibiotic and length of treatment depend on your symptoms and the type of bacteria causing your infection. HOME CARE INSTRUCTIONS  If you were prescribed antibiotics, take them exactly as your caregiver instructs you. Finish the medication even if you feel better after you have only taken some of the medication.  Drink enough water and fluids to keep your urine clear or pale yellow.  Avoid caffeine, tea, and carbonated beverages. They tend to irritate your bladder.  Empty your bladder often. Avoid holding urine for long periods of time.  Empty your bladder before and after sexual intercourse.  After a bowel movement, women should cleanse from front to back. Use each tissue only once. SEEK MEDICAL CARE IF:   You have back pain.  You develop a fever.  Your symptoms do not begin to resolve within 3 days. SEEK IMMEDIATE MEDICAL CARE IF:   You have severe back pain or lower abdominal pain.  You develop chills.  You have nausea or vomiting.  You have continued burning or discomfort with urination. MAKE SURE YOU:   Understand these instructions.  Will watch your condition.  Will get help right away if you are not doing well or get worse. Document Released: 03/20/2005 Document Revised: 12/10/2011 Document Reviewed: 07/19/2011 [facility] Patient Information 2015 [place], [place]. This information is not intended to replace advice given to you by your health care provider. Make sure you discuss any questions you have with your health care provider.   Take your home potassium daily for the next 7 days to completely replenish your potassium level.

## 2014-11-30 NOTE — ED Notes (Signed)
Pt states she is ready [place] home. EDP aware

## 2014-11-30 NOTE — ED Notes (Signed)
Pt reports generalized weakness, diarrhea x4 days. Pt alert and oriented. Pt reports increased fatigue as well.

## 2014-12-01 LAB — URINE CULTURE

## 2014-12-06 ENCOUNTER — Telehealth: Payer: Self-pay | Admitting: Internal Medicine

## 2014-12-06 NOTE — Telephone Encounter (Signed)
Pt is on the July recall list for CT pancreatic protocol

## 2014-12-06 NOTE — Telephone Encounter (Signed)
Letter in the mail 

## 2014-12-14 ENCOUNTER — Other Ambulatory Visit (HOSPITAL_COMMUNITY): Payer: Self-pay | Admitting: Family Medicine

## 2014-12-14 DIAGNOSIS — Z1231 Encounter for screening mammogram for malignant neoplasm of breast: Secondary | ICD-10-CM

## 2014-12-15 ENCOUNTER — Other Ambulatory Visit: Payer: Self-pay

## 2014-12-15 DIAGNOSIS — K859 Acute pancreatitis, unspecified: Secondary | ICD-10-CM

## 2015-01-03 ENCOUNTER — Encounter (HOSPITAL_COMMUNITY): Payer: Self-pay

## 2015-01-03 ENCOUNTER — Ambulatory Visit (HOSPITAL_COMMUNITY)
Admission: RE | Admit: 2015-01-03 | Discharge: 2015-01-03 | Disposition: A | Discharge status: Home / Self Care | Payer: Medicare Other | Source: Ambulatory Visit | Attending: Gastroenterology | Admitting: Gastroenterology

## 2015-01-03 DIAGNOSIS — R109 Unspecified abdominal pain: Secondary | ICD-10-CM | POA: Diagnosis not present

## 2015-01-03 DIAGNOSIS — N200 Calculus of kidney: Secondary | ICD-10-CM | POA: Diagnosis not present

## 2015-01-03 DIAGNOSIS — K859 Acute pancreatitis, unspecified: Secondary | ICD-10-CM | POA: Diagnosis present

## 2015-01-03 MED ORDER — IOHEXOL 300 MG/ML  SOLN
100.0000 mL | Freq: Once | INTRAMUSCULAR | Status: AC | PRN
  Administered 2015-01-03: 100 mL via INTRAVENOUS

## 2015-01-04 NOTE — Progress Notes (Signed)
Quick Note:  CT reviewed. EUS on file from [facility]. Negative biopsy at time of EUS. No mass. ______

## 2015-01-16 ENCOUNTER — Ambulatory Visit (HOSPITAL_COMMUNITY)
Admission: RE | Admit: 2015-01-16 | Discharge: 2015-01-16 | Disposition: A | Discharge status: Home / Self Care | Payer: Medicare Other | Source: Ambulatory Visit | Attending: Family Medicine | Admitting: Family Medicine

## 2015-01-16 DIAGNOSIS — Z1231 Encounter for screening mammogram for malignant neoplasm of breast: Secondary | ICD-10-CM | POA: Insufficient documentation

## 2015-04-21 ENCOUNTER — Ambulatory Visit (HOSPITAL_COMMUNITY): Payer: Medicare Other | Attending: Physician Assistant

## 2015-04-21 DIAGNOSIS — R293 Abnormal posture: Secondary | ICD-10-CM

## 2015-04-21 DIAGNOSIS — M6281 Muscle weakness (generalized): Secondary | ICD-10-CM | POA: Diagnosis present

## 2015-04-21 DIAGNOSIS — M4120 Other idiopathic scoliosis, site unspecified: Secondary | ICD-10-CM | POA: Insufficient documentation

## 2015-04-21 DIAGNOSIS — Z7409 Other reduced mobility: Secondary | ICD-10-CM | POA: Insufficient documentation

## 2015-04-21 NOTE — Therapy (Addendum)
[place], [place], [address] Phone: [phone]   Fax:  [phone]  Physical Therapy Evaluation  Patient Details  Name: Connie Wood MRN: [id number] Date of Birth: 12/04/1940 Referring Provider: Jeri Cos  Encounter Date: 04/21/2015      PT End of Session - 04/28/15 1630    Visit Number 2   Number of Visits 16   Date for PT Re-Evaluation 06/21/15   Authorization Type [place] - Visit Number 1   Authorization - Number of Visits 10   PT Start Time 1518   PT Stop Time 1558   PT Time Calculation (min) 40 min   Equipment Utilized During Treatment Back brace   Activity Tolerance Patient tolerated treatment well;Patient limited by fatigue;Patient limited by pain   Behavior During Therapy [facility] for tasks assessed/performed      Past Medical History  Diagnosis Date  . Hx of degenerative disc disease   . Hypertension   . Hypercholesterolemia   . Stroke   . Migraines   . Hypercholesteremia 02/03/2014  . Cat scratch of right forearm 02/03/2014  . Hemorrhoid 04/06/2014    Past Surgical History  Procedure Laterality Date  . Cholecystectomy    . Abdominal hysterectomy    . Breast surgery    . Appendectomy    . Tonsillectomy    . Bilateral salpingoophorectomy    . Anterior and posterior repair    . Ercp with sphincterotomy  2003    Dr. Gala Romney: balloon dredging of bile duct, normal ampulla, choledocholithiasis   . Colonoscopy  2003    Dr. Gala Romney: normal rectum, scattered diverticula    There were no vitals filed for this visit.  Visit Diagnosis:  Idiopathic scoliosis - Plan: PT plan of care cert/re-cert  Posture abnormality - Plan: PT plan of care cert/re-cert  Weakness of trunk musculature - Plan: PT plan of care cert/re-cert  Muscle weakness of lower extremity - Plan: PT plan of care cert/re-cert  Impaired functional mobility, balance, gait, and endurance - Plan: PT plan of  care cert/re-cert      Subjective Assessment - 04/28/15 1522    Subjective Patient reports that she is having quite a bit of pain today in her knees, back, and elbows   Limitations Sitting;Standing;Walking   Currently in Pain? Yes   Pain Score 8    Pain Location Back  elbows and knees as well             [facility] PT Assessment - 04/28/15 0001    Observation/Other Assessments   Observations checked hip mobility with hip hugs; some limitation however not fully rigid          Posture: Posture severe kyphosis 33.5 degrees at C7/T1 in full upright posture.  Most severe curvature is in lumbar with L convexity T9-L3.  Gait analysis:  70mwt 469ft SPC in RUE 2 point gait, good sequencing. R pelvic drop with L Trendelenburg and weakness worsening over time. Trunk slightly rotated to Right.  Strength MMT: 5/5 biceps, 4/5 Triceps, Hand to crown of head ok, deltoids 5/5, shrug 5/5 bilat Hip flexion 3/5 bilat, L knee ext 5/5, R knee ext 3+/5, L ankle DF 5/5, R ankle DF 3+/5 Hip Rotation R IR 3/5 sitting. All other 5/5.    Other:  Pt gives history regarding R medial ankle hematoma, intermittent swelling of BLE areas            [facility] Adult PT  Treatment/Exercise - 04/28/15 0001    Lumbar Exercises: Stretches   Active Hamstring Stretch 2 reps;30 seconds   Active Hamstring Stretch Limitations stairs    Passive Hamstring Stretch 2 reps;30 seconds   Passive Hamstring Stretch Limitations gastroc on slantboard    Lumbar Exercises: Standing   Other Standing Lumbar Exercises 3D hip excursions1x10 with HHA    Lumbar Exercises: Seated   Other Seated Lumbar Exercises side overhead reaches and cross-midline reaches with weight shifting to break scoliosis posturing    Other Seated Lumbar Exercises scapular retractions 1x10, posterior shoulder rolls 1x10                PT Education - 04/28/15 1630    Education provided Yes   Education Details reviewed initial eval and goals     Person(s) Educated Patient   Methods Explanation   Comprehension Verbalized understanding;Need further instruction          PT Short Term Goals - 04/21/15 1654    PT SHORT TERM GOAL #1   Title Pt will demonstrate independence in beginning home exercise program by twos weeks after commencement of therapy, to affirm self-efficacy in work at home to making progress toward goals.   PT SHORT TERM GOAL #2   Title After 2 weeks, pt will describe in detail 3 ways to manage exacerbation of symptoms at home to demonstrate greater self-efficacy in self-management of wellness and function.    PT SHORT TERM GOAL #3   Title Pt will improve six-minute-walk-test distance time by 35% after four weeks of therapy to demonstrate improved activity tolerance to limited community distance ambulation and indep in IADL.    Baseline 450 feet           PT Long Term Goals - 04/21/15 1654    PT LONG TERM GOAL #1   Title Pt will demonstrate independence in advanced home exercise program by 1 week prior to discharge, to further self-efficacy in continuation of progress toward goals after discharge from therapy.    PT LONG TERM GOAL #2   Title After 8 weeks, pt will improve six-minute-walk-test distance to within 85-90% of age matched normative values to demonstrate improved activity tolerance to limited community distance ambulation and indep in IADL.    Baseline 450 feet   PT LONG TERM GOAL #3   Title Pt will demonstrate a minimal 4/5 strength in all major LE muscle groups to demosntrate improved strength requisite for improved gait quality and tolerance.                Plan - 04/28/15 1631    Clinical Impression Statement Focused on functional activities to assist patient in getting out of classical severe scoliosis postruing today, including side bends and rotatory activities. Also performed postural exercises today. Patient also does demonstrate very poor balance and is easily perturbed; experienced  one LOB while  PT was checking  hip mobility in standing  and required Min(A) to correct. Discussed her brace, which she reports was per MD order but that she is very disatisfied with the new brace she has as she cannot get it to tighteen. Strongly recommend education about being out of brace to facilitate improved core strength; also note that her current and described braces do not necessarily appear appropriate for scoliosis and may be worsening her pain by promoting reduced core strength/stability.    Pt will benefit from skilled therapeutic intervention in order to improve on the following deficits Abnormal gait;Decreased activity tolerance;Decreased balance;Decreased mobility;Decreased  endurance;Decreased range of motion;Decreased skin integrity;Decreased strength;Difficulty walking;Hypomobility;Increased edema;Postural dysfunction   Rehab Potential Fair   Clinical Impairments Affecting Rehab Potential chronic history of scoliosis with severe lumbar curvature abnormality.    PT Frequency 2x / week   PT Duration 8 weeks   PT Treatment/Interventions ADLs/Self Care Home Management;Moist Heat;Therapeutic exercise;Therapeutic activities;Stair training;Gait training;Balance training;Patient/family education   PT Next Visit Plan Assign HEP. Investigate directional preference in trunk mobility. isolated strengthening in BLE R>L, periscapular strengthening. Extensive education regarding need to be out of brace/s to promote core strength/reduce pain.    PT Home Exercise Plan None issued at eval.    Consulted and Agree with Plan of Care Patient         Problem List Patient Active Problem List   Diagnosis Date Noted  . Common bile duct dilation 08/24/2014  . Acute pancreatitis   . Pancreatitis 07/11/2014  . Anal itching 04/06/2014  . Hemorrhoid 04/06/2014  . Hypertension 02/03/2014  . Hypercholesteremia 02/03/2014  . Cat scratch of right forearm 02/03/2014  . Dyspnea 07/12/2013  . Bronchitis  07/11/2013  . Acute bronchitis 07/11/2013  . Nonhealing nonsurgical wound 04/05/2013  . Stasis edema 04/05/2013    Buccola,Allan C 04/28/2015, 6:02 PM  6:02 PM  Etta Grandchild, PT, DPT [place] License # [id number] 8:11 PM    *addended to reflect pt frequent seizure activity and falls precaution.  Etta Grandchild, PT, DPT [place] License # [id number]            [facility] [address] [place], [place], [address] Phone: [phone]   Fax:  [phone]  Name: Connie Wood MRN: [id number] Date of Birth: [birth year]

## 2015-04-26 ENCOUNTER — Telehealth (HOSPITAL_COMMUNITY): Payer: Self-pay

## 2015-04-26 ENCOUNTER — Ambulatory Visit (HOSPITAL_COMMUNITY): Payer: Medicare Other | Attending: Physician Assistant

## 2015-04-26 DIAGNOSIS — M6281 Muscle weakness (generalized): Secondary | ICD-10-CM | POA: Insufficient documentation

## 2015-04-26 DIAGNOSIS — Z7409 Other reduced mobility: Secondary | ICD-10-CM | POA: Insufficient documentation

## 2015-04-26 DIAGNOSIS — M4120 Other idiopathic scoliosis, site unspecified: Secondary | ICD-10-CM | POA: Insufficient documentation

## 2015-04-26 DIAGNOSIS — R293 Abnormal posture: Secondary | ICD-10-CM | POA: Insufficient documentation

## 2015-04-26 NOTE — Telephone Encounter (Signed)
No show, called home and spoke to pt. who stated she had wrotten down schedule in wrong place, pt reminded of next apt date and time with contact information given.  Pt plans to make it to next apt.  [address], [place]; [facility] [phone]

## 2015-04-28 ENCOUNTER — Ambulatory Visit (HOSPITAL_COMMUNITY): Payer: Medicare Other | Admitting: Physical Therapy

## 2015-04-28 DIAGNOSIS — Z7409 Other reduced mobility: Secondary | ICD-10-CM

## 2015-04-28 DIAGNOSIS — R293 Abnormal posture: Secondary | ICD-10-CM

## 2015-04-28 DIAGNOSIS — M4120 Other idiopathic scoliosis, site unspecified: Secondary | ICD-10-CM | POA: Diagnosis not present

## 2015-04-28 DIAGNOSIS — M6281 Muscle weakness (generalized): Secondary | ICD-10-CM

## 2015-04-28 NOTE — Therapy (Signed)
[place], [place], [address] Phone: [phone]   Fax:  [phone]  Physical Therapy Treatment  Patient Details  Name: Connie Wood MRN: [id number] Date of Birth: 05-09-1941 Referring Provider: Jeri Cos  Encounter Date: 04/28/2015      PT End of Session - 04/28/15 1630    Visit Number 2   Number of Visits 16   Date for PT Re-Evaluation 06/21/15   Authorization Type [place] - Visit Number 1   Authorization - Number of Visits 10   PT Start Time 1518   PT Stop Time 1558   PT Time Calculation (min) 40 min   Equipment Utilized During Treatment Back brace   Activity Tolerance Patient tolerated treatment well;Patient limited by fatigue;Patient limited by pain   Behavior During Therapy [facility] for tasks assessed/performed      Past Medical History  Diagnosis Date  . Hx of degenerative disc disease   . Hypertension   . Hypercholesterolemia   . Stroke   . Migraines   . Hypercholesteremia 02/03/2014  . Cat scratch of right forearm 02/03/2014  . Hemorrhoid 04/06/2014    Past Surgical History  Procedure Laterality Date  . Cholecystectomy    . Abdominal hysterectomy    . Breast surgery    . Appendectomy    . Tonsillectomy    . Bilateral salpingoophorectomy    . Anterior and posterior repair    . Ercp with sphincterotomy  2003    Dr. Gala Romney: balloon dredging of bile duct, normal ampulla, choledocholithiasis   . Colonoscopy  2003    Dr. Gala Romney: normal rectum, scattered diverticula    There were no vitals filed for this visit.  Visit Diagnosis:  Idiopathic scoliosis  Posture abnormality  Weakness of trunk musculature  Muscle weakness of lower extremity  Impaired functional mobility, balance, gait, and endurance      Subjective Assessment - 04/28/15 1522    Subjective Patient reports that she is having quite a bit of pain today in her knees, back, and elbows   Limitations  Sitting;Standing;Walking   Currently in Pain? Yes   Pain Score 8    Pain Location Back  elbows and knees as well             [facility] PT Assessment - 04/28/15 0001    Observation/Other Assessments   Observations checked hip mobility with hip hugs; some limitation however not fully rigid                      [facility] Adult PT Treatment/Exercise - 04/28/15 0001    Lumbar Exercises: Stretches   Active Hamstring Stretch 2 reps;30 seconds   Active Hamstring Stretch Limitations stairs    Passive Hamstring Stretch 2 reps;30 seconds   Passive Hamstring Stretch Limitations gastroc on slantboard    Lumbar Exercises: Standing   Other Standing Lumbar Exercises 3D hip excursions1x10 with HHA    Lumbar Exercises: Seated   Other Seated Lumbar Exercises side overhead reaches and cross-midline reaches with weight shifting to break scoliosis posturing    Other Seated Lumbar Exercises scapular retractions 1x10, posterior shoulder rolls 1x10                PT Education - 04/28/15 1630    Education provided Yes   Education Details reviewed initial eval and goals    Person(s) Educated Patient   Methods Explanation   Comprehension Verbalized understanding;Need further instruction  PT Short Term Goals - 04/21/15 1654    PT SHORT TERM GOAL #1   Title Pt will demonstrate independence in beginning home exercise program by twos weeks after commencement of therapy, to affirm self-efficacy in work at home to making progress toward goals.   PT SHORT TERM GOAL #2   Title After 2 weeks, pt will describe in detail 3 ways to manage exacerbation of symptoms at home to demonstrate greater self-efficacy in self-management of wellness and function.    PT SHORT TERM GOAL #3   Title Pt will improve six-minute-walk-test distance time by 35% after four weeks of therapy to demonstrate improved activity tolerance to limited community distance ambulation and indep in IADL.    Baseline 450  feet           PT Long Term Goals - 04/21/15 1654    PT LONG TERM GOAL #1   Title Pt will demonstrate independence in advanced home exercise program by 1 week prior to discharge, to further self-efficacy in continuation of progress toward goals after discharge from therapy.    PT LONG TERM GOAL #2   Title After 8 weeks, pt will improve six-minute-walk-test distance to within 85-90% of age matched normative values to demonstrate improved activity tolerance to limited community distance ambulation and indep in IADL.    Baseline 450 feet   PT LONG TERM GOAL #3   Title Pt will demonstrate a minimal 4/5 strength in all major LE muscle groups to demosntrate improved strength requisite for improved gait quality and tolerance.                Plan - 04/28/15 1631    Clinical Impression Statement Focused on functional activities to assist patient in getting out of classical severe scoliosis postruing today, including side bends and rotatory activities. Also performed postural exercises today. Patient also does demonstrate very poor balance and is easily perturbed; experienced one LOB while  PT was checking  hip mobility in standing  and required Min(A) to correct. Discussed her brace, which she reports was per MD order but that she is very disatisfied with the new brace she has as she cannot get it to tighteen. Strongly recommend education about being out of brace to facilitate improved core strength; also note that her current and described braces do not necessarily appear appropriate for scoliosis and may be worsening her pain by promoting reduced core strength/stability.    Pt will benefit from skilled therapeutic intervention in order to improve on the following deficits Abnormal gait;Decreased activity tolerance;Decreased balance;Decreased mobility;Decreased endurance;Decreased range of motion;Decreased skin integrity;Decreased strength;Difficulty walking;Hypomobility;Increased edema;Postural  dysfunction   Rehab Potential Fair   Clinical Impairments Affecting Rehab Potential chronic history of scoliosis with severe lumbar curvature abnormality.    PT Frequency 2x / week   PT Duration 8 weeks   PT Treatment/Interventions ADLs/Self Care Home Management;Moist Heat;Therapeutic exercise;Therapeutic activities;Stair training;Gait training;Balance training;Patient/family education   PT Next Visit Plan Assign HEP. Investigate directional preference in trunk mobility. isolated strengthening in BLE R>L, periscapular strengthening. Extensive education regarding need to be out of brace/s to promote core strength/reduce pain.    PT Home Exercise Plan None issued at eval.    Consulted and Agree with Plan of Care Patient        Problem List Patient Active Problem List   Diagnosis Date Noted  . Common bile duct dilation 08/24/2014  . Acute pancreatitis   . Pancreatitis 07/11/2014  . Anal itching 04/06/2014  . Hemorrhoid 04/06/2014  .  Hypertension 02/03/2014  . Hypercholesteremia 02/03/2014  . Cat scratch of right forearm 02/03/2014  . Dyspnea 07/12/2013  . Bronchitis 07/11/2013  . Acute bronchitis 07/11/2013  . Nonhealing nonsurgical wound 04/05/2013  . Stasis edema 04/05/2013    Deniece Ree PT, DPT [place] [address] [place], [place], [address] Phone: [phone]   Fax:  [phone]  Name: Connie Wood MRN: [id number] Date of Birth: Feb 03, 1941

## 2015-05-03 ENCOUNTER — Ambulatory Visit (HOSPITAL_COMMUNITY): Payer: Medicare Other | Admitting: Physical Therapy

## 2015-05-03 ENCOUNTER — Other Ambulatory Visit (HOSPITAL_COMMUNITY): Payer: Self-pay | Admitting: Family Medicine

## 2015-05-03 ENCOUNTER — Ambulatory Visit (HOSPITAL_COMMUNITY)
Admission: RE | Admit: 2015-05-03 | Discharge: 2015-05-03 | Disposition: A | Discharge status: Home / Self Care | Payer: Medicare Other | Source: Ambulatory Visit | Attending: Family Medicine | Admitting: Family Medicine

## 2015-05-03 ENCOUNTER — Telehealth (HOSPITAL_COMMUNITY): Payer: Self-pay

## 2015-05-03 DIAGNOSIS — R0781 Pleurodynia: Secondary | ICD-10-CM | POA: Insufficient documentation

## 2015-05-03 DIAGNOSIS — R52 Pain, unspecified: Secondary | ICD-10-CM

## 2015-05-03 DIAGNOSIS — R6889 Other general symptoms and signs: Secondary | ICD-10-CM | POA: Diagnosis not present

## 2015-05-03 NOTE — Telephone Encounter (Signed)
Patient is having chest pain and is going to see her Md.

## 2015-05-04 ENCOUNTER — Other Ambulatory Visit: Payer: Self-pay | Admitting: *Deleted

## 2015-05-05 ENCOUNTER — Telehealth (HOSPITAL_COMMUNITY): Payer: Self-pay | Admitting: Physical Therapy

## 2015-05-05 ENCOUNTER — Ambulatory Visit (HOSPITAL_COMMUNITY)
Admission: RE | Admit: 2015-05-05 | Discharge: 2015-05-05 | Disposition: A | Discharge status: Home / Self Care | Payer: Medicare Other | Source: Ambulatory Visit | Attending: Family Medicine | Admitting: Family Medicine

## 2015-05-05 ENCOUNTER — Ambulatory Visit (HOSPITAL_COMMUNITY): Payer: Medicare Other | Admitting: Physical Therapy

## 2015-05-05 ENCOUNTER — Other Ambulatory Visit (HOSPITAL_COMMUNITY): Payer: Self-pay | Admitting: Family Medicine

## 2015-05-05 DIAGNOSIS — R609 Edema, unspecified: Secondary | ICD-10-CM

## 2015-05-05 DIAGNOSIS — M7122 Synovial cyst of popliteal space [Baker], left knee: Secondary | ICD-10-CM | POA: Diagnosis not present

## 2015-05-05 DIAGNOSIS — R600 Localized edema: Secondary | ICD-10-CM | POA: Diagnosis present

## 2015-05-05 NOTE — Telephone Encounter (Signed)
Dr. Jeananne Rama office called to state that pt was with them and she will not be able to keep this apptment. Patient will come in on Mondday NF

## 2015-05-08 ENCOUNTER — Ambulatory Visit (HOSPITAL_COMMUNITY): Payer: Medicare Other | Admitting: Physical Therapy

## 2015-05-08 DIAGNOSIS — M4120 Other idiopathic scoliosis, site unspecified: Secondary | ICD-10-CM | POA: Diagnosis not present

## 2015-05-08 DIAGNOSIS — R293 Abnormal posture: Secondary | ICD-10-CM

## 2015-05-08 DIAGNOSIS — M6281 Muscle weakness (generalized): Secondary | ICD-10-CM

## 2015-05-08 DIAGNOSIS — Z7409 Other reduced mobility: Secondary | ICD-10-CM

## 2015-05-08 MED ORDER — LACTULOSE 10 GM/15ML PO SOLN: 10.0000 g | Freq: Every day | ORAL | Status: DC

## 2015-05-08 NOTE — Telephone Encounter (Signed)
LAST SEEN BY ME IN 2011. WILL ROUTE TO JENNIFER

## 2015-05-08 NOTE — Therapy (Signed)
[place], [place], [address] Phone: [phone]   Fax:  [phone]  Physical Therapy Treatment  Patient Details  Name: Connie Wood MRN: [id number] Date of Birth: December 29, 1940 Referring Provider: Jeri Cos  Encounter Date: 05/08/2015      PT End of Session - 05/08/15 1202    Visit Number 3   Number of Visits 16   Date for PT Re-Evaluation 06/21/15   Authorization Type [place] - Visit Number 3   Authorization - Number of Visits 10   PT Start Time 1100   PT Stop Time 1145   PT Time Calculation (min) 45 min   Equipment Utilized During Treatment Back brace   Activity Tolerance Patient tolerated treatment well;Patient limited by fatigue;Patient limited by pain   Behavior During Therapy [facility] for tasks assessed/performed      Past Medical History  Diagnosis Date  . Hx of degenerative disc disease   . Hypertension   . Hypercholesterolemia   . Stroke   . Migraines   . Hypercholesteremia 02/03/2014  . Cat scratch of right forearm 02/03/2014  . Hemorrhoid 04/06/2014    Past Surgical History  Procedure Laterality Date  . Cholecystectomy    . Abdominal hysterectomy    . Breast surgery    . Appendectomy    . Tonsillectomy    . Bilateral salpingoophorectomy    . Anterior and posterior repair    . Ercp with sphincterotomy  2003    Dr. Gala Romney: balloon dredging of bile duct, normal ampulla, choledocholithiasis   . Colonoscopy  2003    Dr. Gala Romney: normal rectum, scattered diverticula    There were no vitals filed for this visit.  Visit Diagnosis:  Idiopathic scoliosis  Posture abnormality  Weakness of trunk musculature  Muscle weakness of lower extremity  Impaired functional mobility, balance, gait, and endurance      Subjective Assessment - 05/08/15 1159    Subjective PT states the exercises last session was too strenous and flaired up her sciatica.  PT requests to go  easy today.  Reports currently in 7/10 pain.   Currently in Pain? Yes   Pain Score 7    Pain Location Back   Pain Orientation Mid;Lower;Right                         [facility] Adult PT Treatment/Exercise - 05/08/15 0001    Lumbar Exercises: Stretches   Active Hamstring Stretch 2 reps;30 seconds   Active Hamstring Stretch Limitations stairs    Passive Hamstring Stretch 2 reps;30 seconds   Passive Hamstring Stretch Limitations gastroc on slantboard    Lumbar Exercises: Standing   Other Standing Lumbar Exercises 3D hip excursions1x10 with HHA    Lumbar Exercises: Seated   Other Seated Lumbar Exercises side overhead reaches and cross-midline reaches with weight shifting to break scoliosis posturing    Other Seated Lumbar Exercises scapular retractions 1x10, posterior shoulder rolls 1x10   Lumbar Exercises: Supine   Bridge 10 reps   Straight Leg Raise 10 reps                PT Education - 05/08/15 1134    Education provided Yes   Education Details given HEP and exercise instruction.   Person(s) Educated Patient   Methods Explanation;Demonstration;Tactile cues;Verbal cues;Handout   Comprehension Returned demonstration;Need further instruction          PT Short Term Goals -  04/21/15 1654    PT SHORT TERM GOAL #1   Title Pt will demonstrate independence in beginning home exercise program by twos weeks after commencement of therapy, to affirm self-efficacy in work at home to making progress toward goals.   PT SHORT TERM GOAL #2   Title After 2 weeks, pt will describe in detail 3 ways to manage exacerbation of symptoms at home to demonstrate greater self-efficacy in self-management of wellness and function.    PT SHORT TERM GOAL #3   Title Pt will improve six-minute-walk-test distance time by 35% after four weeks of therapy to demonstrate improved activity tolerance to limited community distance ambulation and indep in IADL.    Baseline 450 feet            PT Long Term Goals - 04/21/15 1654    PT LONG TERM GOAL #1   Title Pt will demonstrate independence in advanced home exercise program by 1 week prior to discharge, to further self-efficacy in continuation of progress toward goals after discharge from therapy.    PT LONG TERM GOAL #2   Title After 8 weeks, pt will improve six-minute-walk-test distance to within 85-90% of age matched normative values to demonstrate improved activity tolerance to limited community distance ambulation and indep in IADL.    Baseline 450 feet   PT LONG TERM GOAL #3   Title Pt will demonstrate a minimal 4/5 strength in all major LE muscle groups to demosntrate improved strength requisite for improved gait quality and tolerance.                Plan - 05/08/15 1138    Clinical Impression Statement Estabilished HEP for patient and given written copy of initial evaluation with explanation of goals.  PT able to complete all exercises without c/o pain, however constand cues to complete in correct form . PT did c/o sciatic pain upon returning back to seated position from supine.  PT encouraged to decrease use of brace and continue her HEP on regular basis for best results.  PT verbalized understanding.    PT Next Visit Plan Progress isolated strengthening in BLE R>L, periscapular strengthening. Extensive education regarding need to be out of brace/s to promote core strength/reduce pain. Instruct with piriformis stretch in seated position next session.    PT Home Exercise Plan None issued at eval.    Consulted and Agree with Plan of Care Patient        Problem List Patient Active Problem List   Diagnosis Date Noted  . Common bile duct dilation 08/24/2014  . Acute pancreatitis   . Pancreatitis 07/11/2014  . Anal itching 04/06/2014  . Hemorrhoid 04/06/2014  . Hypertension 02/03/2014  . Hypercholesteremia 02/03/2014  . Cat scratch of right forearm 02/03/2014  . Dyspnea 07/12/2013  . Bronchitis 07/11/2013  .  Acute bronchitis 07/11/2013  . Nonhealing nonsurgical wound 04/05/2013  . Stasis edema 04/05/2013    Teena Irani, PTA/CLT [phone]  05/08/2015, 12:03 PM  [place] [address] [place], [place], [address] Phone: [phone]   Fax:  [phone]  Name: Connie Wood MRN: [id number] Date of Birth: May 05, 1941

## 2015-05-08 NOTE — Patient Instructions (Signed)
RIB CAGE: Lateral Stretch (Kneeling)    Inhaling, reach torso toward right and exhaling, lower right arm onto right leg. Turn left palm up and bring left arm up toward right side. Inhaling, stretch into left arm. Exhaling, reach down leg with right arm. Hold position for __15_seconds. Switch legs and repeat on other side. Repeat _10__ times, each side. Do __2_ times per day.  Copyright  VHI. All rights reserved.  Chair Pose    Stand with legs hip-width apart. Inhaling, bend knees trying to keep knees over ankles, as if to sit on a chair and extend arms in front, wrists slightly lower than shoulders.  Repeat _10__ times. Do __2_ times per day.  Copyright  VHI. All rights reserved.  Hamstring Stretch (Standing)    Standing, place one heel on chair or bench. Use one or both hands on thigh for support. Keeping torso straight, lean forward slowly until a stretch is felt in back of same thigh. Hold __30__ seconds. Repeat with other leg.  Copyright  VHI. All rights reserved.  Straight Leg Raise    Tighten stomach and slowly raise locked right leg __10__ inches from floor. Repeat _10___ times per set. Do __2__ sessions per day.  http://orth.exer.us/1103   Copyright  VHI. All rights reserved.  Bridge [employer] small of back into mat, maintain pelvic tilt, roll up one vertebrae at a time. Focus on engaging posterior hip muscles. Repeat _10___ times.  Copyright  VHI. All rights reserved.

## 2015-05-10 ENCOUNTER — Ambulatory Visit (HOSPITAL_COMMUNITY): Payer: Medicare Other

## 2015-05-10 DIAGNOSIS — M6281 Muscle weakness (generalized): Secondary | ICD-10-CM

## 2015-05-10 DIAGNOSIS — Z7409 Other reduced mobility: Secondary | ICD-10-CM

## 2015-05-10 DIAGNOSIS — R293 Abnormal posture: Secondary | ICD-10-CM

## 2015-05-10 DIAGNOSIS — M4120 Other idiopathic scoliosis, site unspecified: Secondary | ICD-10-CM

## 2015-05-10 NOTE — Therapy (Addendum)
PHYSICAL THERAPY DISCHARGE SUMMARY  Visits from Start of Care: 4  Current functional level related to goals / functional outcomes: **see below   Remaining deficits: *see below   Education / Equipment: *see below Plan:                                                    Patient goals were not met. Patient is being discharged due to not returning since the last visit.  ?????   4:27 PM, 08/16/2015 Etta Grandchild, PT, DPT PRN Physical Therapist at [facility] # [id number] [phone] (office)      [place], [place], [address] Phone: [phone]   Fax:  [phone]  Physical Therapy Treatment  Patient Details  Name: Connie Wood MRN: [id number] Date of Birth: [birth year] Referring Provider: Jeri Cos  Encounter Date: 05/10/2015      PT End of Session - 05/10/15 1114    Visit Number 4   Number of Visits 16   Date for PT Re-Evaluation 06/21/15   Authorization Type [place] - Visit Number 4   Authorization - Number of Visits 10   PT Start Time 1107   PT Stop Time 1150   PT Time Calculation (min) 43 min   Equipment Utilized During Treatment Back brace  Removed during supine and standing 3D hip excursion   Activity Tolerance Patient tolerated treatment well;Patient limited by fatigue;Patient limited by pain   Behavior During Therapy [facility] for tasks assessed/performed      Past Medical History  Diagnosis Date  . Hx of degenerative disc disease   . Hypertension   . Hypercholesterolemia   . Stroke   . Migraines   . Hypercholesteremia 02/03/2014  . Cat scratch of right forearm 02/03/2014  . Hemorrhoid 04/06/2014    Past Surgical History  Procedure Laterality Date  . Cholecystectomy    . Abdominal hysterectomy    . Breast surgery    . Appendectomy    . Tonsillectomy    . Bilateral salpingoophorectomy    . Anterior and posterior repair    . Ercp with  sphincterotomy  2003    Dr. Gala Romney: balloon dredging of bile duct, normal ampulla, choledocholithiasis   . Colonoscopy  2003    Dr. Gala Romney: normal rectum, scattered diverticula    There were no vitals filed for this visit.  Visit Diagnosis:  Idiopathic scoliosis  Posture abnormality  Weakness of trunk musculature  Muscle weakness of lower extremity  Impaired functional mobility, balance, gait, and endurance      Subjective Assessment - 05/10/15 1110    Subjective Pt stated her back is killing her today with reports of sciatic symptoms/  Current pain scale 8/10   Currently in Pain? Yes   Pain Score 8    Pain Location Back   Pain Orientation Lower             [facility] Adult PT Treatment/Exercise - 05/10/15 0001    Lumbar Exercises: Stretches   Active Hamstring Stretch --   Passive Hamstring Stretch 2 reps;30 seconds   Passive Hamstring Stretch Limitations gastroc on slantboard    Piriformis Stretch 2 reps;30 seconds   Piriformis Stretch Limitations seated    Lumbar Exercises: Standing   Row 5 reps;Left   Theraband  Level (Row) Level 3 (Green)   Row Limitations mini squat and row Lt UE only   Other Standing Lumbar Exercises 3D hip excursions1x10 with HHA    Other Standing Lumbar Exercises Minisquat then punch Rt UE with theraband    Lumbar Exercises: Seated   Other Seated Lumbar Exercises side overhead reaches and cross-midline reaches with weight shifting to break scoliosis posturing    Lumbar Exercises: Supine   Bridge 10 reps   Bridge Limitations 2 sets, no brace   Straight Leg Raise 10 reps             PT Short Term Goals - 04/21/15 1654    PT SHORT TERM GOAL #1   Title Pt will demonstrate independence in beginning home exercise program by twos weeks after commencement of therapy, to affirm self-efficacy in work at home to making progress toward goals.   PT SHORT TERM GOAL #2   Title After 2 weeks, pt will describe in detail 3 ways to manage exacerbation of  symptoms at home to demonstrate greater self-efficacy in self-management of wellness and function.    PT SHORT TERM GOAL #3   Title Pt will improve six-minute-walk-test distance time by 35% after four weeks of therapy to demonstrate improved activity tolerance to limited community distance ambulation and indep in IADL.    Baseline 450 feet           PT Long Term Goals - 04/21/15 1654    PT LONG TERM GOAL #1   Title Pt will demonstrate independence in advanced home exercise program by 1 week prior to discharge, to further self-efficacy in continuation of progress toward goals after discharge from therapy.    PT LONG TERM GOAL #2   Title After 8 weeks, pt will improve six-minute-walk-test distance to within 85-90% of age matched normative values to demonstrate improved activity tolerance to limited community distance ambulation and indep in IADL.    Baseline 450 feet   PT LONG TERM GOAL #3   Title Pt will demonstrate a minimal 4/5 strength in all major LE muscle groups to demosntrate improved strength requisite for improved gait quality and tolerance.                Plan - 05/10/15 1128    Clinical Impression Statement Session focus on improving spinal alignment and postural strengthening to assist with scoliosis posture for pain control.  Pt stated she has not completed new HEP exercises, fell asleep during dinner last night.  Pt educated on benefits of completeing exercises without brace on for core strengtehning.  Added piriformis stretches to reduce sciatic like symptoms.  Pt required multimodal cueing for proper form and technique with all exercises.  No reports of radicular symptoms at end of session, reports pain reduced.   PT Next Visit Plan Progress isolated strengthening in BLE R>L, periscapular strengthening. Extensive education regarding need to be out of brace/s to promote core strength/reduce pain.         Problem List Patient Active Problem List   Diagnosis Date  Noted  . Common bile duct dilation 08/24/2014  . Acute pancreatitis   . Pancreatitis 07/11/2014  . Anal itching 04/06/2014  . Hemorrhoid 04/06/2014  . Hypertension 02/03/2014  . Hypercholesteremia 02/03/2014  . Cat scratch of right forearm 02/03/2014  . Dyspnea 07/12/2013  . Bronchitis 07/11/2013  . Acute bronchitis 07/11/2013  . Nonhealing nonsurgical wound 04/05/2013  . Stasis edema 04/05/2013   Ihor Austin, LPTA; CBIS [phone]  Aldona Lento  05/10/2015, 11:52 AM  [facility] [address] [place], [place], [address] Phone: [phone]   Fax:  [phone]  Name: Connie Wood MRN: [id number] Date of Birth: 09-13-1940

## 2015-05-15 ENCOUNTER — Telehealth (HOSPITAL_COMMUNITY): Payer: Self-pay | Admitting: Physical Therapy

## 2015-05-15 ENCOUNTER — Ambulatory Visit (HOSPITAL_COMMUNITY): Payer: Medicare Other | Admitting: Physical Therapy

## 2015-05-15 NOTE — Telephone Encounter (Signed)
Her legs are swallen and she can not come in today

## 2015-05-16 ENCOUNTER — Telehealth (HOSPITAL_COMMUNITY): Payer: Self-pay

## 2015-05-16 NOTE — Telephone Encounter (Signed)
Patients Md wants her to stop PT and maybe resume after Christmas.

## 2015-05-17 ENCOUNTER — Ambulatory Visit (HOSPITAL_COMMUNITY): Payer: Medicare Other

## 2015-05-22 ENCOUNTER — Encounter (HOSPITAL_COMMUNITY): Payer: Medicare Other | Admitting: Physical Therapy

## 2015-05-25 ENCOUNTER — Encounter (HOSPITAL_COMMUNITY): Payer: Medicare Other

## 2015-05-30 ENCOUNTER — Encounter (HOSPITAL_COMMUNITY): Payer: Medicare Other

## 2015-06-01 ENCOUNTER — Encounter (HOSPITAL_COMMUNITY): Payer: Medicare Other

## 2015-06-06 ENCOUNTER — Telehealth: Payer: Self-pay | Admitting: Adult Health

## 2015-06-06 ENCOUNTER — Encounter (HOSPITAL_COMMUNITY): Payer: Medicare Other

## 2015-06-06 NOTE — Telephone Encounter (Signed)
Pt needs PCP, numbers given for Dr Moshe Cipro and Dr Ulice Brilliant

## 2015-06-08 ENCOUNTER — Encounter (HOSPITAL_COMMUNITY): Payer: Medicare Other

## 2015-06-12 ENCOUNTER — Encounter (HOSPITAL_COMMUNITY): Payer: Medicare Other | Admitting: Physical Therapy

## 2015-10-25 ENCOUNTER — Other Ambulatory Visit (HOSPITAL_COMMUNITY): Payer: Self-pay | Admitting: Internal Medicine

## 2015-10-25 DIAGNOSIS — T380X5A Adverse effect of glucocorticoids and synthetic analogues, initial encounter: Principal | ICD-10-CM

## 2015-10-25 DIAGNOSIS — M8580 Other specified disorders of bone density and structure, unspecified site: Secondary | ICD-10-CM

## 2015-11-01 ENCOUNTER — Other Ambulatory Visit (HOSPITAL_COMMUNITY): Payer: Self-pay | Admitting: Internal Medicine

## 2015-11-01 DIAGNOSIS — Z13220 Encounter for screening for lipoid disorders: Secondary | ICD-10-CM

## 2015-11-01 DIAGNOSIS — E785 Hyperlipidemia, unspecified: Secondary | ICD-10-CM

## 2015-11-10 ENCOUNTER — Other Ambulatory Visit (HOSPITAL_COMMUNITY): Payer: Medicare Other

## 2015-11-10 ENCOUNTER — Encounter (HOSPITAL_COMMUNITY): Payer: Self-pay

## 2015-11-10 ENCOUNTER — Ambulatory Visit (HOSPITAL_COMMUNITY)
Admission: RE | Admit: 2015-11-10 | Discharge: 2015-11-10 | Disposition: A | Discharge status: Home / Self Care | Payer: Medicare Other | Source: Ambulatory Visit | Attending: Internal Medicine | Admitting: Internal Medicine

## 2015-11-10 DIAGNOSIS — N200 Calculus of kidney: Secondary | ICD-10-CM | POA: Insufficient documentation

## 2015-11-10 DIAGNOSIS — Z13220 Encounter for screening for lipoid disorders: Secondary | ICD-10-CM | POA: Insufficient documentation

## 2015-11-10 DIAGNOSIS — K5710 Diverticulosis of small intestine without perforation or abscess without bleeding: Secondary | ICD-10-CM | POA: Diagnosis not present

## 2015-11-10 DIAGNOSIS — I2510 Atherosclerotic heart disease of native coronary artery without angina pectoris: Secondary | ICD-10-CM | POA: Diagnosis not present

## 2015-11-10 DIAGNOSIS — M810 Age-related osteoporosis without current pathological fracture: Secondary | ICD-10-CM | POA: Insufficient documentation

## 2015-11-10 DIAGNOSIS — M859 Disorder of bone density and structure, unspecified: Secondary | ICD-10-CM | POA: Diagnosis present

## 2015-11-10 DIAGNOSIS — E785 Hyperlipidemia, unspecified: Secondary | ICD-10-CM

## 2015-11-10 DIAGNOSIS — M419 Scoliosis, unspecified: Secondary | ICD-10-CM | POA: Insufficient documentation

## 2015-11-10 DIAGNOSIS — M8580 Other specified disorders of bone density and structure, unspecified site: Secondary | ICD-10-CM

## 2015-11-10 DIAGNOSIS — T380X5A Adverse effect of glucocorticoids and synthetic analogues, initial encounter: Principal | ICD-10-CM

## 2015-11-10 LAB — POCT I-STAT CREATININE: Creatinine, Ser: 0.8 mg/dL (ref 0.44–1.00)

## 2015-11-10 MED ORDER — IOPAMIDOL (ISOVUE-300) INJECTION 61%
100.0000 mL | Freq: Once | INTRAVENOUS | Status: AC | PRN
  Administered 2015-11-10: 100 mL via INTRAVENOUS

## 2015-12-04 ENCOUNTER — Encounter: Payer: Self-pay | Admitting: Gastroenterology

## 2015-12-04 ENCOUNTER — Ambulatory Visit (INDEPENDENT_AMBULATORY_CARE_PROVIDER_SITE_OTHER): Payer: Medicare Other | Admitting: Gastroenterology

## 2015-12-04 ENCOUNTER — Other Ambulatory Visit: Payer: Self-pay

## 2015-12-04 VITALS — BP 151/91 | HR 100 | Temp 97.6°F | Ht 61.0 in | Wt 130.4 lb

## 2015-12-04 DIAGNOSIS — Z8719 Personal history of other diseases of the digestive system: Secondary | ICD-10-CM

## 2015-12-04 DIAGNOSIS — Z1211 Encounter for screening for malignant neoplasm of colon: Secondary | ICD-10-CM

## 2015-12-04 MED ORDER — PEG 3350-KCL-NA BICARB-NACL 420 G PO SOLR: 4000.0000 mL | Freq: Once | ORAL | Status: DC

## 2015-12-04 NOTE — Progress Notes (Signed)
Referring Provider: Jani Gravel, MD Primary Care Physician:  Jani Gravel, MD  Primary GI: Dr. Gala Romney   Chief Complaint  Patient presents with  . Follow-up    HPI:   Connie Wood is a 75 y.o. female presenting today with a history of idiopathic pancreatitis, s/p EUS in 2016 with heterogenous lesion in pancreas neck and FNA negative for malignancy. Pancreatic protocol several months after with no pancreatic mass noted. Recent CT May 2017 without pancreatic mass, stable hypodensity in calcifications along right hepatic lobe capsule, stable intrahepatic and extrahepatic biliary dilatation.   Last colonoscopy in 2003. Overdue for routine screening now. Hasn't been feeling well. Can't sleep at night. Sometimes up till 2am. Wakes up early in the morning. Chronic back pain. No GI complaints.       Past Medical History  Diagnosis Date  . Hx of degenerative disc disease   . Hypertension   . Hypercholesterolemia   . Stroke ([place])   . Migraines   . Hypercholesteremia 02/03/2014  . Cat scratch of right forearm 02/03/2014  . Hemorrhoid 04/06/2014    Past Surgical History  Procedure Laterality Date  . Cholecystectomy    . Abdominal hysterectomy    . Breast surgery    . Appendectomy    . Tonsillectomy    . Bilateral salpingoophorectomy    . Anterior and posterior repair    . Ercp with sphincterotomy  2003    Dr. Gala Romney: balloon dredging of bile duct, normal ampulla, choledocholithiasis   . Colonoscopy  2003    Dr. Gala Romney: normal rectum, scattered diverticula  . Eus  2016    Dr. Amalia Hailey: subtle 13X17 mm heterogenous lesion in pancreas neck, FNA negative for malignancy     Current Outpatient Prescriptions  Medication Sig Dispense Refill  . AZOR 10-40 MG per tablet Take 1 tablet by mouth daily. Reported on 12/04/2015    . ciprofloxacin (CIPRO) 500 MG tablet Take 500 mg by mouth 2 (two) times daily.     . clorazepate (TRANXENE) 15 MG tablet Take 7.5 mg by mouth daily.     Marland Kitchen  esomeprazole (NEXIUM) 40 MG capsule Take 1 capsule (40 mg total) by mouth daily. 20 capsule 0  . HYDROcodone-acetaminophen (NORCO) 10-325 MG tablet Take 1 tablet by mouth every 6 (six) hours as needed.    . hydrOXYzine (ATARAX/VISTARIL) 25 MG tablet Take 50-100 mg by mouth every 4 (four) hours as needed for itching.    Marland Kitchen ibuprofen (ADVIL,MOTRIN) 200 MG tablet Take 400 mg by mouth every 6 (six) hours as needed for headache or moderate pain.    Marland Kitchen lactulose (CHRONULAC) 10 GM/15ML solution Take 10 g by mouth daily as needed for mild constipation.    Marland Kitchen LORazepam (ATIVAN) 1 MG tablet Take 1-1.5 tablets by mouth 3 times/day as needed-between meals & bedtime for sleep.     Marland Kitchen ofloxacin (OCUFLOX) 0.3 % ophthalmic solution Place 2 drops into the right ear daily as needed (for pain). 2 drops in right ear daily prn.    . venlafaxine XR (EFFEXOR-XR) 150 MG 24 hr capsule Take 150 mg by mouth daily.     . polyethylene glycol-electrolytes (NULYTELY/GOLYTELY) 420 g solution Take 4,000 mLs by mouth once. 4000 mL 0   No current facility-administered medications for this visit.    Allergies as of 12/04/2015 - Review Complete 12/04/2015  Allergen Reaction Noted  . Adhesive [tape] Other (See Comments) 07/11/2014  . Latex Itching and Rash 07/11/2014    Family History  Problem Relation Age of Onset  . Heart disease Mother   . Cancer Father     liver  . Cancer Son   . Heart disease Son   . Cancer Maternal Grandmother   . Colon cancer Neg Hx     Social History   Social History  . Marital Status: Widowed    Spouse Name: N/A  . Number of Children: N/A  . Years of Education: N/A   Social History Main Topics  . Smoking status: Current Every Day Smoker -- 1.00 packs/day for 50 years    Types: Cigarettes  . Smokeless tobacco: Never Used  . Alcohol Use: No  . Drug Use: No  . Sexual Activity: No   Other Topics Concern  . None   Social History Narrative    Review of Systems: As mentioned in HPI    Physical Exam: BP 151/91 mmHg  Pulse 100  Temp(Src) 97.6 F (36.4 C) (Oral)  Ht 5\' 1"  (1.549 m)  Wt 130 lb 6.4 oz (59.149 kg)  BMI 24.65 kg/m2 General:   Alert and oriented. No distress noted. Pleasant and cooperative.  Head:  Normocephalic and atraumatic. Eyes:  Conjuctiva clear without scleral icterus. Mouth:  Oral mucosa pink and moist. Good dentition. No lesions. Heart:  S1, S2 present  Abdomen:  +BS, soft, non-tender and non-distended. No rebound or guarding. No HSM or masses noted. Msk:  Moderate scoliosis  Extremities:  Without edema. Psych:  Alert and cooperative. Normal mood and affect.

## 2015-12-04 NOTE — Patient Instructions (Signed)
We have scheduled you for a colonoscopy in the near future with Dr. Gala Romney.  Have a great week!

## 2015-12-08 ENCOUNTER — Encounter: Payer: Self-pay | Admitting: Gastroenterology

## 2015-12-08 NOTE — Assessment & Plan Note (Signed)
In 2016, idiopathic. EUS on file, CT with pancreatic protocol on file from last year and most recently an updated CT abd/pelvis (no pancreatic protocol), without concerning findings. Continue to monitor. Doing well at this time.

## 2015-12-08 NOTE — Assessment & Plan Note (Signed)
75 year old female without any concerning lower or upper GI symptoms, due now for routine screening colonoscopy with last in 2003.  Proceed with TCS with Dr. Gala Romney in near future: the risks, benefits, and alternatives have been discussed with the patient in detail. The patient states understanding and desires to proceed. PROPOFOL due to polypharmacy

## 2015-12-11 NOTE — Progress Notes (Signed)
CC'D TO PCP °

## 2015-12-27 ENCOUNTER — Telehealth: Payer: Self-pay | Admitting: Internal Medicine

## 2015-12-27 ENCOUNTER — Inpatient Hospital Stay (HOSPITAL_COMMUNITY): Admission: RE | Admit: 2015-12-27 | Payer: Medicare Other | Source: Ambulatory Visit

## 2015-12-27 NOTE — Telephone Encounter (Signed)
Pt is aware that it is OK to take the Ducolax that is ordered with the prep and she said she will do so.

## 2015-12-27 NOTE — Telephone Encounter (Signed)
It is ok. This warning is for dehydration likely. We ask for this in order to be adequately prepped. I am not sure what prep she was given. This is a one time event and is to ensure that we can adequately visualize the colon.

## 2015-12-27 NOTE — Telephone Encounter (Signed)
I called pt and she said that she was reading over the info on the prep bottle and it said not to take with other laxatives, that it could cause heart problems.   She said that on her instructions she is told to take two Ducolax tablets.  She is concerned because she had Rheumatic fever when she was young and she has a heart murmur.  She said that in the past, Dr. Gala Romney has not required her to take the laxatives.  Please advise!

## 2015-12-27 NOTE — Telephone Encounter (Signed)
[phone]  PATIENT CALLED STATING THAT SHE HAS BEEN READING THE INFO FOR THE COLONOSCOPY.  PLEASE CALL HER TO GO OVER THIS.

## 2015-12-28 ENCOUNTER — Telehealth: Payer: Self-pay

## 2015-12-28 NOTE — Telephone Encounter (Signed)
Called pt to see if she would come in early for her TCS and she can not come early due to her ride.

## 2015-12-28 NOTE — Patient Instructions (Signed)
Connie Wood  12/28/2015     @PREFPERIOPPHARMACY @   Your procedure is scheduled on  01/01/2016  Report to [facility] at  845  A.M.  Call this number if you have problems the morning of surgery:  [phone]   Remember:  Do not eat food or drink liquids after midnight.  Take these medicines the morning of surgery with A SIP OF WATER  Azor, tranxene, nexium, hydrocodone, ativan, effexor.   Do not wear jewelry, make-up or nail polish.  Do not wear lotions, powders, or perfumes.  You may wear deoderant.  Do not shave 48 hours prior to surgery.  Men may shave face and neck.  Do not bring valuables to the hospital.  [facility] is not responsible for any belongings or valuables.  Contacts, dentures or bridgework may not be worn into surgery.  Leave your suitcase in the car.  After surgery it may be brought to your room.  For patients admitted to the hospital, discharge time will be determined by your treatment team.  Patients discharged the day of surgery will not be allowed to drive home.   Name and phone number of your driver:   family Special instructions:  Follow the diet and prep instructions given to you by Dr Roseanne Kaufman office.  Please read over the following fact sheets that you were given. Coughing and Deep Breathing, Surgical Site Infection Prevention, Anesthesia Post-op Instructions and Care and Recovery After Surgery      Colonoscopy A colonoscopy is an exam to look at the entire large intestine (colon). This exam can help find problems such as tumors, polyps, inflammation, and areas of bleeding. The exam takes about 1 hour.  LET [facility] CARE PROVIDER KNOW ABOUT:   Any allergies you have.  All medicines you are taking, including vitamins, herbs, eye drops, creams, and over-the-counter medicines.  Previous problems you or members of your family have had with the use of anesthetics.  Any blood disorders you have.  Previous surgeries  you have had.  Medical conditions you have. RISKS AND COMPLICATIONS  Generally, this is a safe procedure. However, as with any procedure, complications can occur. Possible complications include:  Bleeding.  Tearing or rupture of the colon wall.  Reaction to medicines given during the exam.  Infection (rare). BEFORE THE PROCEDURE   Ask your health care provider about changing or stopping your regular medicines.  You may be prescribed an oral bowel prep. This involves drinking a large amount of medicated liquid, starting the day before your procedure. The liquid will cause you to have multiple loose stools until your stool is almost clear or light green. This cleans out your colon in preparation for the procedure.  Do not eat or drink anything else once you have started the bowel prep, unless your health care provider tells you it is safe to do so.  Arrange for someone to drive you home after the procedure. PROCEDURE   You will be given medicine to help you relax (sedative).  You will lie on your side with your knees bent.  A long, flexible tube with a light and camera on the end (colonoscope) will be inserted through the rectum and into the colon. The camera sends video back to a computer screen as it moves through the colon. The colonoscope also releases carbon dioxide gas to inflate the colon. This helps your health care provider see the area better.  During the exam, your health care provider may take a small tissue sample (biopsy) to be examined under a microscope if any abnormalities are found.  The exam is finished when the entire colon has been viewed. AFTER THE PROCEDURE   Do not drive for 24 hours after the exam.  You may have a small amount of blood in your stool.  You may pass moderate amounts of gas and have mild abdominal cramping or bloating. This is caused by the gas used to inflate your colon during the exam.  Ask when your test results will be ready and how  you will get your results. Make sure you get your test results.   This information is not intended to replace advice given to you by your health care provider. Make sure you discuss any questions you have with your health care provider.   Document Released: 06/07/2000 Document Revised: 03/31/2013 Document Reviewed: 02/15/2013 Elsevier Interactive Patient Education 2016 Elsevier Inc[employer] Colonoscopy, Care After Refer to this sheet in the next few weeks. These instructions provide you with information on caring for yourself after your procedure. Your health care provider may also give you more specific instructions. Your treatment has been planned according to current medical practices, but problems sometimes occur. Call your health care provider if you have any problems or questions after your procedure. WHAT TO EXPECT AFTER THE PROCEDURE  After your procedure, it is typical to have the following:  A small amount of blood in your stool.  Moderate amounts of gas and mild abdominal cramping or bloating. HOME CARE INSTRUCTIONS  Do not drive, operate machinery, or sign important documents for 24 hours.  You may shower and resume your regular physical activities, but move at a slower pace for the first 24 hours.  Take frequent rest periods for the first 24 hours.  Walk around or put a warm pack on your abdomen to help reduce abdominal cramping and bloating.  Drink enough fluids to keep your urine clear or pale yellow.  You may resume your normal diet as instructed by your health care provider. Avoid heavy or fried foods that are hard to digest.  Avoid drinking alcohol for 24 hours or as instructed by your health care provider.  Only take over-the-counter or prescription medicines as directed by your health care provider.  If a tissue sample (biopsy) was taken during your procedure:  Do not take aspirin or blood thinners for 7 days, or as instructed by your health care provider.  Do not  drink alcohol for 7 days, or as instructed by your health care provider.  Eat soft foods for the first 24 hours. SEEK MEDICAL CARE IF: You have persistent spotting of blood in your stool 2-3 days after the procedure. SEEK IMMEDIATE MEDICAL CARE IF:  You have more than a small spotting of blood in your stool.  You pass large blood clots in your stool.  Your abdomen is swollen (distended).  You have nausea or vomiting.  You have a fever.  You have increasing abdominal pain that is not relieved with medicine.   This information is not intended to replace advice given to you by your health care provider. Make sure you discuss any questions you have with your health care provider.   Document Released: 01/23/2004 Document Revised: 03/31/2013 Document Reviewed: 02/15/2013 Elsevier Interactive Patient Education 2016 Elsevier Inc. PATIENT INSTRUCTIONS POST-ANESTHESIA  IMMEDIATELY FOLLOWING SURGERY:  Do not drive or operate machinery for the first twenty four hours after surgery.  Do  not make any important decisions for twenty four hours after surgery or while taking narcotic pain medications or sedatives.  If you develop intractable nausea and vomiting or a severe headache please notify your doctor immediately.  FOLLOW-UP:  Please make an appointment with your surgeon as instructed. You do not need to follow up with anesthesia unless specifically instructed to do so.  WOUND CARE INSTRUCTIONS (if applicable):  Keep a dry clean dressing on the anesthesia/puncture wound site if there is drainage.  Once the wound has quit draining you may leave it open to air.  Generally you should leave the bandage intact for twenty four hours unless there is drainage.  If the epidural site drains for more than 36-48 hours please call the anesthesia department.  QUESTIONS?:  Please feel free to call your physician or the hospital operator if you have any questions, and they will be happy to assist you.       Monitored Anesthesia Care Monitored anesthesia care is an anesthesia service for a medical procedure. Anesthesia is the loss of the ability to feel pain. It is produced by medicines called anesthetics. It may affect a small area of your body (local anesthesia), a large area of your body (regional anesthesia), or your entire body (general anesthesia). The need for monitored anesthesia care depends your procedure, your condition, and the potential need for regional or general anesthesia. It is often provided during procedures where:   General anesthesia may be needed if there are complications. This is because you need special care when you are under general anesthesia.   You will be under local or regional anesthesia. This is so that you are able to have higher levels of anesthesia if needed.   You will receive calming medicines (sedatives). This is especially the case if sedatives are given to put you in a semi-conscious state of relaxation (deep sedation). This is because the amount of sedative needed to produce this state can be hard to predict. Too much of a sedative can produce general anesthesia. Monitored anesthesia care is performed by one or more health care providers who have special training in all types of anesthesia. You will need to meet with these health care providers before your procedure. During this meeting, they will ask you about your medical history. They will also give you instructions to follow. (For example, you will need to stop eating and drinking before your procedure. You may also need to stop or change medicines you are taking.) During your procedure, your health care providers will stay with you. They will:   Watch your condition. This includes watching your blood pressure, breathing, and level of pain.   Diagnose and treat problems that occur.   Give medicines if they are needed. These may include calming medicines (sedatives) and anesthetics.   Make sure you  are comfortable.  Having monitored anesthesia care does not necessarily mean that you will be under anesthesia. It does mean that your health care providers will be able to manage anesthesia if you need it or if it occurs. It also means that you will be able to have a different type of anesthesia than you are having if you need it. When your procedure is complete, your health care providers will continue to watch your condition. They will make sure any medicines wear off before you are allowed to go home.    This information is not intended to replace advice given to you by your health care provider. Make sure you  discuss any questions you have with your health care provider.   Document Released: 03/06/2005 Document Revised: 07/01/2014 Document Reviewed: 07/22/2012 Elsevier Interactive Patient Education [employer].

## 2015-12-29 ENCOUNTER — Encounter (HOSPITAL_COMMUNITY): Payer: Self-pay

## 2015-12-29 ENCOUNTER — Encounter (HOSPITAL_COMMUNITY)
Admission: RE | Admit: 2015-12-29 | Discharge: 2015-12-29 | Disposition: A | Discharge status: Home / Self Care | Payer: Medicare Other | Source: Ambulatory Visit | Attending: Internal Medicine | Admitting: Internal Medicine

## 2015-12-29 ENCOUNTER — Other Ambulatory Visit: Payer: Self-pay

## 2015-12-29 DIAGNOSIS — E7800 Pure hypercholesterolemia, unspecified: Secondary | ICD-10-CM | POA: Diagnosis not present

## 2015-12-29 DIAGNOSIS — F17210 Nicotine dependence, cigarettes, uncomplicated: Secondary | ICD-10-CM | POA: Diagnosis not present

## 2015-12-29 DIAGNOSIS — D120 Benign neoplasm of cecum: Secondary | ICD-10-CM | POA: Diagnosis not present

## 2015-12-29 DIAGNOSIS — Z90710 Acquired absence of both cervix and uterus: Secondary | ICD-10-CM | POA: Diagnosis not present

## 2015-12-29 DIAGNOSIS — I10 Essential (primary) hypertension: Secondary | ICD-10-CM | POA: Diagnosis not present

## 2015-12-29 DIAGNOSIS — Z1211 Encounter for screening for malignant neoplasm of colon: Secondary | ICD-10-CM | POA: Diagnosis not present

## 2015-12-29 DIAGNOSIS — Z9049 Acquired absence of other specified parts of digestive tract: Secondary | ICD-10-CM | POA: Diagnosis not present

## 2015-12-29 DIAGNOSIS — Z800 Family history of malignant neoplasm of digestive organs: Secondary | ICD-10-CM | POA: Diagnosis not present

## 2015-12-29 DIAGNOSIS — Z8673 Personal history of transient ischemic attack (TIA), and cerebral infarction without residual deficits: Secondary | ICD-10-CM | POA: Diagnosis not present

## 2015-12-29 DIAGNOSIS — G8929 Other chronic pain: Secondary | ICD-10-CM | POA: Diagnosis not present

## 2015-12-29 DIAGNOSIS — Z8249 Family history of ischemic heart disease and other diseases of the circulatory system: Secondary | ICD-10-CM | POA: Diagnosis not present

## 2015-12-29 DIAGNOSIS — D123 Benign neoplasm of transverse colon: Secondary | ICD-10-CM | POA: Diagnosis not present

## 2015-12-29 DIAGNOSIS — K5730 Diverticulosis of large intestine without perforation or abscess without bleeding: Secondary | ICD-10-CM | POA: Diagnosis not present

## 2015-12-29 DIAGNOSIS — Z91048 Other nonmedicinal substance allergy status: Secondary | ICD-10-CM | POA: Diagnosis not present

## 2015-12-29 DIAGNOSIS — Z91040 Latex allergy status: Secondary | ICD-10-CM | POA: Diagnosis not present

## 2015-12-29 DIAGNOSIS — M549 Dorsalgia, unspecified: Secondary | ICD-10-CM | POA: Diagnosis not present

## 2015-12-29 DIAGNOSIS — Z79899 Other long term (current) drug therapy: Secondary | ICD-10-CM | POA: Diagnosis not present

## 2015-12-29 DIAGNOSIS — G43909 Migraine, unspecified, not intractable, without status migrainosus: Secondary | ICD-10-CM | POA: Diagnosis not present

## 2015-12-29 DIAGNOSIS — Z809 Family history of malignant neoplasm, unspecified: Secondary | ICD-10-CM | POA: Diagnosis not present

## 2015-12-29 HISTORY — DX: Unspecified osteoarthritis, unspecified site: M19.90

## 2015-12-29 HISTORY — DX: Dorsalgia, unspecified: M54.9

## 2015-12-29 HISTORY — DX: Unspecified hearing loss, unspecified ear: H91.90

## 2015-12-29 LAB — CBC WITH DIFFERENTIAL/PLATELET
BASOS ABS: 0.1 10*3/uL (ref 0.0–0.1)
Basophils Relative: 1 %
Eosinophils Absolute: 0.4 10*3/uL (ref 0.0–0.7)
Eosinophils Relative: 7 %
HCT: 34.2 % — ABNORMAL LOW (ref 36.0–46.0)
Hemoglobin: 11.2 g/dL — ABNORMAL LOW (ref 12.0–15.0)
LYMPHS PCT: 38 %
Lymphs Abs: 2.4 10*3/uL (ref 0.7–4.0)
MCH: 32.1 pg (ref 26.0–34.0)
MCHC: 32.7 g/dL (ref 30.0–36.0)
MCV: 98 fL (ref 78.0–100.0)
MONO ABS: 0.7 10*3/uL (ref 0.1–1.0)
Monocytes Relative: 11 %
Neutro Abs: 2.8 10*3/uL (ref 1.7–7.7)
Neutrophils Relative %: 43 %
PLATELETS: 208 10*3/uL (ref 150–400)
RBC: 3.49 MIL/uL — ABNORMAL LOW (ref 3.87–5.11)
RDW: 13.1 % (ref 11.5–15.5)
WBC: 6.3 10*3/uL (ref 4.0–10.5)

## 2015-12-29 LAB — BASIC METABOLIC PANEL
Anion gap: 6 (ref 5–15)
BUN: 32 mg/dL — AB (ref 6–20)
CO2: 26 mmol/L (ref 22–32)
Calcium: 8.6 mg/dL — ABNORMAL LOW (ref 8.9–10.3)
Chloride: 105 mmol/L (ref 101–111)
Creatinine, Ser: 1.1 mg/dL — ABNORMAL HIGH (ref 0.44–1.00)
GFR calc Af Amer: 55 mL/min — ABNORMAL LOW (ref >60)
GFR, EST NON AFRICAN AMERICAN: 48 mL/min — AB (ref >60)
GLUCOSE: 142 mg/dL — AB (ref 65–99)
POTASSIUM: 4.1 mmol/L (ref 3.5–5.1)
Sodium: 137 mmol/L (ref 135–145)

## 2016-01-01 ENCOUNTER — Encounter (HOSPITAL_COMMUNITY): Payer: Self-pay

## 2016-01-01 ENCOUNTER — Ambulatory Visit (HOSPITAL_COMMUNITY): Payer: Medicare Other | Admitting: Anesthesiology

## 2016-01-01 ENCOUNTER — Ambulatory Visit (HOSPITAL_COMMUNITY)
Admission: RE | Admit: 2016-01-01 | Discharge: 2016-01-01 | Disposition: A | Discharge status: Home / Self Care | Payer: Medicare Other | Source: Ambulatory Visit | Attending: Internal Medicine | Admitting: Internal Medicine

## 2016-01-01 ENCOUNTER — Encounter (HOSPITAL_COMMUNITY)
Admission: RE | Disposition: A | Discharge status: Home / Self Care | Payer: Self-pay | Source: Ambulatory Visit | Attending: Internal Medicine

## 2016-01-01 DIAGNOSIS — Z809 Family history of malignant neoplasm, unspecified: Secondary | ICD-10-CM | POA: Insufficient documentation

## 2016-01-01 DIAGNOSIS — Z86010 Personal history of colonic polyps: Secondary | ICD-10-CM | POA: Insufficient documentation

## 2016-01-01 DIAGNOSIS — G8929 Other chronic pain: Secondary | ICD-10-CM | POA: Insufficient documentation

## 2016-01-01 DIAGNOSIS — K5730 Diverticulosis of large intestine without perforation or abscess without bleeding: Secondary | ICD-10-CM | POA: Diagnosis not present

## 2016-01-01 DIAGNOSIS — Z8249 Family history of ischemic heart disease and other diseases of the circulatory system: Secondary | ICD-10-CM | POA: Insufficient documentation

## 2016-01-01 DIAGNOSIS — G43909 Migraine, unspecified, not intractable, without status migrainosus: Secondary | ICD-10-CM | POA: Insufficient documentation

## 2016-01-01 DIAGNOSIS — Z800 Family history of malignant neoplasm of digestive organs: Secondary | ICD-10-CM | POA: Insufficient documentation

## 2016-01-01 DIAGNOSIS — D123 Benign neoplasm of transverse colon: Secondary | ICD-10-CM | POA: Insufficient documentation

## 2016-01-01 DIAGNOSIS — E7800 Pure hypercholesterolemia, unspecified: Secondary | ICD-10-CM | POA: Insufficient documentation

## 2016-01-01 DIAGNOSIS — Z1211 Encounter for screening for malignant neoplasm of colon: Secondary | ICD-10-CM | POA: Diagnosis not present

## 2016-01-01 DIAGNOSIS — Z79899 Other long term (current) drug therapy: Secondary | ICD-10-CM | POA: Insufficient documentation

## 2016-01-01 DIAGNOSIS — Z91040 Latex allergy status: Secondary | ICD-10-CM | POA: Insufficient documentation

## 2016-01-01 DIAGNOSIS — F17210 Nicotine dependence, cigarettes, uncomplicated: Secondary | ICD-10-CM | POA: Insufficient documentation

## 2016-01-01 DIAGNOSIS — Z90710 Acquired absence of both cervix and uterus: Secondary | ICD-10-CM | POA: Insufficient documentation

## 2016-01-01 DIAGNOSIS — Z9049 Acquired absence of other specified parts of digestive tract: Secondary | ICD-10-CM | POA: Insufficient documentation

## 2016-01-01 DIAGNOSIS — M549 Dorsalgia, unspecified: Secondary | ICD-10-CM | POA: Insufficient documentation

## 2016-01-01 DIAGNOSIS — I10 Essential (primary) hypertension: Secondary | ICD-10-CM | POA: Insufficient documentation

## 2016-01-01 DIAGNOSIS — Z8673 Personal history of transient ischemic attack (TIA), and cerebral infarction without residual deficits: Secondary | ICD-10-CM | POA: Insufficient documentation

## 2016-01-01 DIAGNOSIS — Z91048 Other nonmedicinal substance allergy status: Secondary | ICD-10-CM | POA: Insufficient documentation

## 2016-01-01 DIAGNOSIS — D120 Benign neoplasm of cecum: Secondary | ICD-10-CM | POA: Insufficient documentation

## 2016-01-01 HISTORY — PX: POLYPECTOMY: SHX5525

## 2016-01-01 HISTORY — PX: COLONOSCOPY WITH PROPOFOL: SHX5780

## 2016-01-01 SURGERY — COLONOSCOPY WITH PROPOFOL
Anesthesia: Monitor Anesthesia Care

## 2016-01-01 MED ORDER — ONDANSETRON HCL 4 MG/2ML IJ SOLN: 4.0000 mg | Freq: Once | INTRAMUSCULAR | Status: DC | PRN

## 2016-01-01 MED ORDER — PROPOFOL 500 MG/50ML IV EMUL
INTRAVENOUS | Status: DC | PRN
  Administered 2016-01-01: 100 ug/kg/min via INTRAVENOUS
  Administered 2016-01-01: 50 ug/kg/min via INTRAVENOUS

## 2016-01-01 MED ORDER — FENTANYL CITRATE (PF) 100 MCG/2ML IJ SOLN: 25.0000 ug | INTRAMUSCULAR | Status: DC | PRN

## 2016-01-01 MED ORDER — ONDANSETRON HCL 4 MG/2ML IJ SOLN
4.0000 mg | Freq: Once | INTRAMUSCULAR | Status: AC
  Administered 2016-01-01: 4 mg via INTRAVENOUS

## 2016-01-01 MED ORDER — ONDANSETRON HCL 4 MG/2ML IJ SOLN
INTRAMUSCULAR | Status: AC
  Filled 2016-01-01: qty 2

## 2016-01-01 MED ORDER — FENTANYL CITRATE (PF) 100 MCG/2ML IJ SOLN
25.0000 ug | INTRAMUSCULAR | Status: DC | PRN
  Administered 2016-01-01: 25 ug via INTRAVENOUS

## 2016-01-01 MED ORDER — MIDAZOLAM HCL 2 MG/2ML IJ SOLN
1.0000 mg | INTRAMUSCULAR | Status: DC | PRN
  Administered 2016-01-01: 2 mg via INTRAVENOUS
  Filled 2016-01-01: qty 2

## 2016-01-01 MED ORDER — PROPOFOL 10 MG/ML IV BOLUS
INTRAVENOUS | Status: DC | PRN
  Administered 2016-01-01 (×3): 10 mg via INTRAVENOUS

## 2016-01-01 MED ORDER — SODIUM CHLORIDE 0.9 % IV SOLN: INTRAVENOUS | Status: DC

## 2016-01-01 MED ORDER — FENTANYL CITRATE (PF) 100 MCG/2ML IJ SOLN
INTRAMUSCULAR | Status: AC
  Filled 2016-01-01: qty 2

## 2016-01-01 MED ORDER — LACTATED RINGERS IV SOLN
INTRAVENOUS | Status: DC
  Administered 2016-01-01: 10:00:00 via INTRAVENOUS

## 2016-01-01 MED ORDER — MIDAZOLAM HCL 5 MG/5ML IJ SOLN
INTRAMUSCULAR | Status: DC | PRN
  Administered 2016-01-01 (×2): 1 mg via INTRAVENOUS

## 2016-01-01 MED ORDER — SODIUM CHLORIDE 0.9 % IJ SOLN
INTRAMUSCULAR | Status: DC | PRN
  Administered 2016-01-01: 7 mL via INTRAVENOUS

## 2016-01-01 MED ORDER — SODIUM CHLORIDE 0.9% FLUSH
INTRAVENOUS | Status: AC
  Filled 2016-01-01: qty 20

## 2016-01-01 MED ORDER — MIDAZOLAM HCL 2 MG/2ML IJ SOLN
INTRAMUSCULAR | Status: AC
  Filled 2016-01-01: qty 2

## 2016-01-01 MED ORDER — LIDOCAINE HCL (CARDIAC) 10 MG/ML IV SOLN
INTRAVENOUS | Status: DC | PRN
  Administered 2016-01-01: 50 mg via INTRAVENOUS

## 2016-01-01 NOTE — Discharge Instructions (Signed)
°Colonoscopy °Discharge Instructions ° °Read the instructions outlined below and refer to this sheet in the next few weeks. These discharge instructions provide you with general information on caring for yourself after you leave the hospital. Your doctor may also give you specific instructions. While your treatment has been planned according to the most current medical practices available, unavoidable complications occasionally occur. If you have any problems or questions after discharge, call Dr. Rourk at [phone]. °ACTIVITY °· You may resume your regular activity, but move at a slower pace for the next 24 hours.  °· Take frequent rest periods for the next 24 hours.  °· Walking will help get rid of the air and reduce the bloated feeling in your belly (abdomen).  °· No driving for 24 hours (because of the medicine (anesthesia) used during the test).   °· Do not sign any important legal documents or operate any machinery for 24 hours (because of the anesthesia used during the test).  °NUTRITION °· Drink plenty of fluids.  °· You may resume your normal diet as instructed by your doctor.  °· Begin with a light meal and progress to your normal diet. Heavy or fried foods are harder to digest and may make you feel sick to your stomach (nauseated).  °· Avoid alcoholic beverages for 24 hours or as instructed.  °MEDICATIONS °· You may resume your normal medications unless your doctor tells you otherwise.  °WHAT YOU CAN EXPECT TODAY °· Some feelings of bloating in the abdomen.  °· Passage of more gas than usual.  °· Spotting of blood in your stool or on the toilet paper.  °IF YOU HAD POLYPS REMOVED DURING THE COLONOSCOPY: °· No aspirin products for 7 days or as instructed.  °· No alcohol for 7 days or as instructed.  °· Eat a soft diet for the next 24 hours.  °FINDING OUT THE RESULTS OF YOUR TEST °Not all test results are available during your visit. If your test results are not back during the visit, make an appointment  with your caregiver to find out the results. Do not assume everything is normal if you have not heard from your caregiver or the medical facility. It is important for you to follow up on all of your test results.  °SEEK IMMEDIATE MEDICAL ATTENTION IF: °· You have more than a spotting of blood in your stool.  °· Your belly is swollen (abdominal distention).  °· You are nauseated or vomiting.  °· You have a temperature over 101.  °· You have abdominal pain or discomfort that is severe or gets worse throughout the day.  ° ° ° °Colon polyp and diverticulosis information provided ° °Further recommendations to follow pending review of pathology report ° ° ° ° ° °                                                                                                                     Colon Polyps °Polyps are lumps of extra tissue growing inside the   body. Polyps can grow in the large intestine (colon). Most colon polyps are noncancerous (benign). However, some colon polyps can become cancerous over time. Polyps that are larger than a pea may be harmful. To be safe, caregivers remove and test all polyps. °CAUSES  °Polyps form when mutations in the genes cause your cells to grow and divide even though no more tissue is needed. °RISK FACTORS °There are a number of risk factors that can increase your chances of getting colon polyps. They include: °· Being older than 50 years. °· Family history of colon polyps or colon cancer. °· Long-term colon diseases, such as colitis or Crohn disease. °· Being overweight. °· Smoking. °· Being inactive. °· Drinking too much alcohol. °SYMPTOMS  °Most small polyps do not cause symptoms. If symptoms are present, they may include: °· Blood in the stool. The stool may look dark red or black. °· Constipation or diarrhea that lasts longer than 1 week. °DIAGNOSIS °People often do not know they have polyps until their caregiver finds them during a regular checkup. Your caregiver can use 4 tests to check for  polyps: °· Digital rectal exam. The caregiver wears gloves and feels inside the rectum. This test would find polyps only in the rectum. °· Barium enema. The caregiver puts a liquid called barium into your rectum before taking X-rays of your colon. Barium makes your colon look white. Polyps are dark, so they are easy to see in the X-ray pictures. °· Sigmoidoscopy. A thin, flexible tube (sigmoidoscope) is placed into your rectum. The sigmoidoscope has a light and tiny camera in it. The caregiver uses the sigmoidoscope to look at the last third of your colon. °· Colonoscopy. This test is like sigmoidoscopy, but the caregiver looks at the entire colon. This is the most common method for finding and removing polyps. °TREATMENT  °Any polyps will be removed during a sigmoidoscopy or colonoscopy. The polyps are then tested for cancer. °PREVENTION  °To help lower your risk of getting more colon polyps: °· Eat plenty of fruits and vegetables. Avoid eating fatty foods. °· Do not smoke. °· Avoid drinking alcohol. °· Exercise every day. °· Lose weight if recommended by your caregiver. °· Eat plenty of calcium and folate. Foods that are rich in calcium include milk, cheese, and broccoli. Foods that are rich in folate include chickpeas, kidney beans, and spinach. °HOME CARE INSTRUCTIONS °Keep all follow-up appointments as directed by your caregiver. You may need periodic exams to check for polyps. °SEEK MEDICAL CARE IF: °You notice bleeding during a bowel movement. °  °This information is not intended to replace advice given to you by your health care provider. Make sure you discuss any questions you have with your health care provider. °  °Document Released: 03/06/2004 Document Revised: 07/01/2014 Document Reviewed: 08/20/2011 °Elsevier Interactive Patient Education ©2016 Elsevier Inc[employer] ° ° ° ° ° ° ° °Diverticulosis °Diverticulosis is the condition that develops when small pouches (diverticula) form in the wall of your colon. Your  colon, or large intestine, is where water is absorbed and stool is formed. The pouches form when the inside layer of your colon pushes through weak spots in the outer layers of your colon. °CAUSES  °No one knows exactly what causes diverticulosis. °RISK FACTORS °· Being older than 50. Your risk for this condition increases with age. Diverticulosis is rare in people younger than 40 years. By age 80, almost everyone has it. °· Eating a low-fiber diet. °· Being frequently constipated. °· Being overweight. °·   6-8 glasses of water each day to prevent constipation.  Try not to strain when you have a bowel movement.  Keep all follow-up appointments. If you have had an infection before:  Increase the fiber in your diet as directed by your health care provider or dietitian.  Take a dietary fiber supplement if your health care provider approves.  Only take medicines as directed by your health care provider. SEEK MEDICAL CARE IF:   You have abdominal pain.  You have bloating.  You have cramps.  You have not gone to the bathroom in 3 days. SEEK IMMEDIATE MEDICAL CARE IF:   Your pain gets worse.  Yourbloating becomes very bad.  You have a fever or chills, and your symptoms suddenly get worse.  You begin  vomiting.  You have bowel movements that are bloody or black. MAKE SURE YOU:  Understand these instructions.  Will watch your condition.  Will get help right away if you are not doing well or get worse.   This information is not intended to replace advice given to you by your health care provider. Make sure you discuss any questions you have with your health care provider.   Document Released: 03/07/2004 Document Revised: 06/15/2013 Document Reviewed: 05/05/2013 Elsevier Interactive Patient Education [employer].

## 2016-01-01 NOTE — Anesthesia Postprocedure Evaluation (Signed)
Anesthesia Post Note  Patient: Connie Wood  Procedure(s) Performed: Procedure(s) (LRB): COLONOSCOPY WITH PROPOFOL (N/A) POLYPECTOMY  Patient location during evaluation: PACU Anesthesia Type: MAC Level of consciousness: awake and alert Pain management: pain level controlled Vital Signs Assessment: post-procedure vital signs reviewed and stable Respiratory status: spontaneous breathing Cardiovascular status: blood pressure returned to baseline Postop Assessment: no signs of nausea or vomiting Anesthetic complications: no    Last Vitals:  Filed Vitals:   01/01/16 1315 01/01/16 1333  BP: 126/62 132/78  Pulse: 79 72  Temp:  36.7 C  Resp: 14 18    Last Pain:  Filed Vitals:   01/01/16 1334  PainSc: 9                  Cohen Boettner

## 2016-01-01 NOTE — Interval H&P Note (Signed)
History and Physical Interval Note:  01/01/2016 11:56 AM  Connie Wood  has presented today for surgery, with the diagnosis of screening colonoscopy  The various methods of treatment have been discussed with the patient and family. After consideration of risks, benefits and other options for treatment, the patient has consented to  Procedure(s) with comments: COLONOSCOPY WITH PROPOFOL (N/A) - 1015 as a surgical intervention .  The patient's history has been reviewed, patient examined, no change in status, stable for surgery.  I have reviewed the patient's chart and labs.  Questions were answered to the patient's satisfaction.     Robert Rourk  No change. Screening colonoscopy per plan.  The risks, benefits, limitations, alternatives and imponderables have been reviewed with the patient. Questions have been answered. All parties are agreeable.

## 2016-01-01 NOTE — Anesthesia Preprocedure Evaluation (Signed)
Anesthesia Evaluation  Patient identified by MRN, date of birth, ID band Patient awake    Reviewed: Allergy & Precautions, NPO status , Patient's Chart, lab work & pertinent test results  Airway Mallampati: II  TM Distance: >3 FB     Dental  (+) Teeth Intact   Pulmonary shortness of breath and with exertion, Current Smoker,    breath sounds clear to auscultation       Cardiovascular hypertension, Pt. on medications  Rhythm:Regular Rate:Normal     Neuro/Psych  Headaches, CVA, No Residual Symptoms    GI/Hepatic negative GI ROS,   Endo/Other    Renal/GU      Musculoskeletal   Abdominal   Peds  Hematology   Anesthesia Other Findings   Reproductive/Obstetrics                             Anesthesia Physical Anesthesia Plan  ASA: III  Anesthesia Plan: MAC   Post-op Pain Management:    Induction: Intravenous  Airway Management Planned: Simple Face Mask  Additional Equipment:   Intra-op Plan:   Post-operative Plan:   Informed Consent: I have reviewed the patients History and Physical, chart, labs and discussed the procedure including the risks, benefits and alternatives for the proposed anesthesia with the patient or authorized representative who has indicated his/her understanding and acceptance.     Plan Discussed with:   Anesthesia Plan Comments:         Anesthesia Quick Evaluation

## 2016-01-01 NOTE — Transfer of Care (Signed)
Immediate Anesthesia Transfer of Care Note  Patient: Connie Wood  Procedure(s) Performed: Procedure(s) with comments: COLONOSCOPY WITH PROPOFOL (N/A) - 1015 POLYPECTOMY -  polypectomies x2-splenic flexure and cecal  Patient Location: PACU  Anesthesia Type:MAC  Level of Consciousness: awake, alert  and oriented  Airway & Oxygen Therapy: Patient Spontanous Breathing and Patient connected to nasal cannula oxygen  Post-op Assessment: Report given to RN  Post vital signs: Reviewed and stable  Last Vitals:  Filed Vitals:   01/01/16 0914  Pulse: 79  Temp: 36.6 C  Resp: 9    Last Pain:  Filed Vitals:   01/01/16 0916  PainSc: 9       Patients Stated Pain Goal: 6 ([id number])  Complications: No apparent anesthesia complications

## 2016-01-01 NOTE — Op Note (Signed)
[facility] Patient Name: Connie Wood Procedure Date: 01/01/2016 11:40 AM MRN: [id number] Date of Birth: Jan 30, 1941 Attending MD: Norvel Richards , MD CSN: [id number] Age: 75 Admit Type: Outpatient Procedure:                Ileo-colonoscopy w/ saline assisted hot snare                            piecemeal polypectomy and ablation Indications:              Screening for colorectal malignant neoplasm Providers:                Norvel Richards, MD, Lurline Del, RN, Purcell Nails.                            Tina Griffiths, Technician Referring MD:              Medicines:                Monitored Anesthesia Care Complications:            No immediate complications. Estimated Blood Loss:     Estimated blood loss: none. Procedure:                Pre-Anesthesia Assessment:                           - Prior to the procedure, a History and Physical                            was performed, and patient medications and                            allergies were reviewed. The patient's tolerance of                            previous anesthesia was also reviewed. The risks                            and benefits of the procedure and the sedation                            options and risks were discussed with the patient.                            All questions were answered, and informed consent                            was obtained. Prior Anticoagulants: The patient has                            taken no previous anticoagulant or antiplatelet                            agents. ASA Grade Assessment: II - A patient with  mild systemic disease. After reviewing the risks                            and benefits, the patient was deemed in                            satisfactory condition to undergo the procedure.                           After obtaining informed consent, the colonoscope                            was passed under direct vision. Throughout the                         procedure, the patient's blood pressure, pulse, and                            oxygen saturations were monitored continuously. The                            [id number]) was introduced through the anus                            and advanced to the 5 cm into the ileum. The                            colonoscopy was performed without difficulty. The                            patient tolerated the procedure well. The quality                            of the bowel preparation was adequate. The terminal                            ileum, ileocecal valve, appendiceal orifice, and                            rectum were photographed. Anatomical landmarks were                            photographed. The quality of the bowel preparation                            was [Prep Quality]. Scope In: 12:12:19 PM Scope Out: 12:53:43 PM Scope Withdrawal Time: 0 hours 33 minutes 5 seconds  Total Procedure Duration: 0 hours 41 minutes 24 seconds  Findings:      Multiple small-mouthed diverticula were found in the sigmoid colon.      An 11 mm polyp was found in the cecum. The polyp was sessile. The polyp       was removed with a hot snare. Resection and retrieval were complete. 3       mL of normal saline injected submucosally.  This lesion was lifted nicely       away from colonic wall. Piecemeal hot snare polypectomy ensued. There       was no adjacent diminutive polyp was ablated with the tip of a hot snare       loop. Estimated blood loss: none.      A 4 mm polyp was found in the splenic flexure. The polyp was sessile. It       was removed with hot snare cautery.      The entire examined colon appeared normal otherwise on direct and       retroflexion views. Estimated blood loss: none. Impression:               - Diverticulosis in the sigmoid colon.                           - One 11 mm polyp in the cecum, removed with a hot                            snare. Resected and  retrieved. status post ablation                            of diminutive cecal polyp.                           - One 4 mm polyp at the splenic flexure.                           - The entire examined colon is normal on direct and                            retroflexion views. Moderate Sedation:      Moderate (conscious) sedation was personally administered by an       anesthesia professional. The following parameters were monitored: oxygen       saturation, heart rate, blood pressure, respiratory rate, EKG, adequacy       of pulmonary ventilation, and response to care. Total physician       intraservice time was 47 minutes. Recommendation:           - Patient has a contact number available for                            emergencies. The signs and symptoms of potential                            delayed complications were discussed with the                            patient. Return to normal activities tomorrow.                            Written discharge instructions were provided to the                            patient.                           -  Advance diet as tolerated.                           - Continue present medications.                           - Repeat colonoscopy date to be determined after                            pending pathology results are reviewed for                            surveillance based on pathology results.                           - Return to GI clinic after studies are complete. Procedure Code(s):        --- Professional ---                           [phone], Colonoscopy, flexible; with removal of                            tumor(s), polyp(s), or other lesion(s) by snare                            technique Diagnosis Code(s):        --- Professional ---                           Z12.11, Encounter for screening for malignant                            neoplasm of colon                           D12.0, Benign neoplasm of cecum                            D12.3, Benign neoplasm of transverse colon (hepatic                            flexure or splenic flexure)                           K57.30, Diverticulosis of large intestine without                            perforation or abscess without bleeding CPT copyright 2016 American Medical Association. All rights reserved. The codes documented in this report are preliminary and upon coder review may  be revised to meet current compliance requirements. Cristopher Estimable. Tymeshia Awan, MD Norvel Richards, MD 01/01/2016 1:05:29 PM This report has been signed electronically. Number of Addenda: 0

## 2016-01-01 NOTE — Anesthesia Procedure Notes (Signed)
Procedure Name: MAC Date/Time: 01/01/2016 12:02 PM Performed by: Vista Deck Pre-anesthesia Checklist: Patient identified, Emergency Drugs available, Suction available, Timeout performed and Patient being monitored Patient Re-evaluated:Patient Re-evaluated prior to inductionOxygen Delivery Method: Non-rebreather mask

## 2016-01-01 NOTE — H&P (View-Only) (Signed)
Referring Provider: Jani Gravel, MD Primary Care Physician:  Jani Gravel, MD  Primary GI: Dr. Gala Romney   Chief Complaint  Patient presents with  . Follow-up    HPI:   Connie Wood is a 75 y.o. female presenting today with a history of idiopathic pancreatitis, s/p EUS in 2016 with heterogenous lesion in pancreas neck and FNA negative for malignancy. Pancreatic protocol several months after with no pancreatic mass noted. Recent CT May 2017 without pancreatic mass, stable hypodensity in calcifications along right hepatic lobe capsule, stable intrahepatic and extrahepatic biliary dilatation.   Last colonoscopy in 2003. Overdue for routine screening now. Hasn't been feeling well. Can't sleep at night. Sometimes up till 2am. Wakes up early in the morning. Chronic back pain. No GI complaints.       Past Medical History  Diagnosis Date  . Hx of degenerative disc disease   . Hypertension   . Hypercholesterolemia   . Stroke ([place])   . Migraines   . Hypercholesteremia 02/03/2014  . Cat scratch of right forearm 02/03/2014  . Hemorrhoid 04/06/2014    Past Surgical History  Procedure Laterality Date  . Cholecystectomy    . Abdominal hysterectomy    . Breast surgery    . Appendectomy    . Tonsillectomy    . Bilateral salpingoophorectomy    . Anterior and posterior repair    . Ercp with sphincterotomy  2003    Dr. Gala Romney: balloon dredging of bile duct, normal ampulla, choledocholithiasis   . Colonoscopy  2003    Dr. Gala Romney: normal rectum, scattered diverticula  . Eus  2016    Dr. Amalia Hailey: subtle 13X17 mm heterogenous lesion in pancreas neck, FNA negative for malignancy     Current Outpatient Prescriptions  Medication Sig Dispense Refill  . AZOR 10-40 MG per tablet Take 1 tablet by mouth daily. Reported on 12/04/2015    . ciprofloxacin (CIPRO) 500 MG tablet Take 500 mg by mouth 2 (two) times daily.     . clorazepate (TRANXENE) 15 MG tablet Take 7.5 mg by mouth daily.     Marland Kitchen  esomeprazole (NEXIUM) 40 MG capsule Take 1 capsule (40 mg total) by mouth daily. 20 capsule 0  . HYDROcodone-acetaminophen (NORCO) 10-325 MG tablet Take 1 tablet by mouth every 6 (six) hours as needed.    . hydrOXYzine (ATARAX/VISTARIL) 25 MG tablet Take 50-100 mg by mouth every 4 (four) hours as needed for itching.    Marland Kitchen ibuprofen (ADVIL,MOTRIN) 200 MG tablet Take 400 mg by mouth every 6 (six) hours as needed for headache or moderate pain.    Marland Kitchen lactulose (CHRONULAC) 10 GM/15ML solution Take 10 g by mouth daily as needed for mild constipation.    Marland Kitchen LORazepam (ATIVAN) 1 MG tablet Take 1-1.5 tablets by mouth 3 times/day as needed-between meals & bedtime for sleep.     Marland Kitchen ofloxacin (OCUFLOX) 0.3 % ophthalmic solution Place 2 drops into the right ear daily as needed (for pain). 2 drops in right ear daily prn.    . venlafaxine XR (EFFEXOR-XR) 150 MG 24 hr capsule Take 150 mg by mouth daily.     . polyethylene glycol-electrolytes (NULYTELY/GOLYTELY) 420 g solution Take 4,000 mLs by mouth once. 4000 mL 0   No current facility-administered medications for this visit.    Allergies as of 12/04/2015 - Review Complete 12/04/2015  Allergen Reaction Noted  . Adhesive [tape] Other (See Comments) 07/11/2014  . Latex Itching and Rash 07/11/2014    Family History  Problem Relation Age of Onset  . Heart disease Mother   . Cancer Father     liver  . Cancer Son   . Heart disease Son   . Cancer Maternal Grandmother   . Colon cancer Neg Hx     Social History   Social History  . Marital Status: Widowed    Spouse Name: N/A  . Number of Children: N/A  . Years of Education: N/A   Social History Main Topics  . Smoking status: Current Every Day Smoker -- 1.00 packs/day for 50 years    Types: Cigarettes  . Smokeless tobacco: Never Used  . Alcohol Use: No  . Drug Use: No  . Sexual Activity: No   Other Topics Concern  . None   Social History Narrative    Review of Systems: As mentioned in HPI    Physical Exam: BP 151/91 mmHg  Pulse 100  Temp(Src) 97.6 F (36.4 C) (Oral)  Ht 5\' 1"  (1.549 m)  Wt 130 lb 6.4 oz (59.149 kg)  BMI 24.65 kg/m2 General:   Alert and oriented. No distress noted. Pleasant and cooperative.  Head:  Normocephalic and atraumatic. Eyes:  Conjuctiva clear without scleral icterus. Mouth:  Oral mucosa pink and moist. Good dentition. No lesions. Heart:  S1, S2 present  Abdomen:  +BS, soft, non-tender and non-distended. No rebound or guarding. No HSM or masses noted. Msk:  Moderate scoliosis  Extremities:  Without edema. Psych:  Alert and cooperative. Normal mood and affect.

## 2016-01-05 ENCOUNTER — Encounter (HOSPITAL_COMMUNITY): Payer: Self-pay | Admitting: Internal Medicine

## 2016-01-05 ENCOUNTER — Encounter: Payer: Self-pay | Admitting: Internal Medicine

## 2016-01-29 ENCOUNTER — Ambulatory Visit (INDEPENDENT_AMBULATORY_CARE_PROVIDER_SITE_OTHER): Payer: Medicare Other | Admitting: Otolaryngology

## 2016-01-29 DIAGNOSIS — H7203 Central perforation of tympanic membrane, bilateral: Secondary | ICD-10-CM

## 2016-01-29 DIAGNOSIS — H906 Mixed conductive and sensorineural hearing loss, bilateral: Secondary | ICD-10-CM

## 2016-01-29 DIAGNOSIS — H9313 Tinnitus, bilateral: Secondary | ICD-10-CM

## 2016-02-14 ENCOUNTER — Telehealth: Payer: Self-pay | Admitting: Internal Medicine

## 2016-02-14 NOTE — Telephone Encounter (Signed)
Pt has called back at 119pm asking when she could speak with the nurse or AB. I told her it may be later this afternoon after we have seen all the patients. Please return her call [phone]

## 2016-02-14 NOTE — Telephone Encounter (Signed)
Pt had colonoscopy on 7/10 by RMR and she said that she has been having problems ever since. She is wanting to speak with the nurse. Please call [phone]

## 2016-02-14 NOTE — Telephone Encounter (Signed)
Spoke with the pt, ever since her tcs she has had this feeling that she is not emptying her bowels. She said " it feels like there is a stick in my rear end". She said she is having good bm's, it is not painful, just very annoying and she cannot stand the feeling that something is there and it wont come out. She is eating well, and eating a lot of raw vegetables, especially carrots. She has also noted that her bladder also feels full all the time and she has frequent urination all day and all night. She said she knew the bladder has nothing to do with her tcs but she was concerned it was related. She is not having any burning or pain with urination.   She was wondering if it was possible that her bladder has fell since she had a hysterectomy and if that could be pressing on her rectum? Or if RMR thinks it could be something else. She is aware that it may be tomorrow when I call her back.

## 2016-02-14 NOTE — Telephone Encounter (Signed)
Communication noted.  Unusual Sx; rectum checked out good at [facility]; I recommend she see her gynecologist / urologist regarding these sx

## 2016-02-15 ENCOUNTER — Encounter (HOSPITAL_COMMUNITY): Payer: Self-pay

## 2016-02-15 ENCOUNTER — Encounter (HOSPITAL_COMMUNITY)
Admission: RE | Admit: 2016-02-15 | Discharge: 2016-02-15 | Disposition: A | Discharge status: Home / Self Care | Payer: Medicare Other | Source: Ambulatory Visit | Attending: Internal Medicine | Admitting: Internal Medicine

## 2016-02-15 DIAGNOSIS — M810 Age-related osteoporosis without current pathological fracture: Secondary | ICD-10-CM | POA: Insufficient documentation

## 2016-02-15 MED ORDER — DENOSUMAB 60 MG/ML ~~LOC~~ SOLN
60.0000 mg | Freq: Once | SUBCUTANEOUS | Status: AC
  Administered 2016-02-15: 60 mg via SUBCUTANEOUS
  Filled 2016-02-15: qty 1

## 2016-02-15 NOTE — Telephone Encounter (Signed)
Pt is aware. She will contact her gyn

## 2016-02-26 ENCOUNTER — Encounter (HOSPITAL_COMMUNITY): Payer: Self-pay | Admitting: Emergency Medicine

## 2016-02-26 ENCOUNTER — Emergency Department (HOSPITAL_COMMUNITY): Payer: Medicare Other

## 2016-02-26 ENCOUNTER — Emergency Department (HOSPITAL_COMMUNITY)
Admission: EM | Admit: 2016-02-26 | Discharge: 2016-02-26 | Disposition: A | Discharge status: Home / Self Care | Payer: Medicare Other | Attending: Emergency Medicine | Admitting: Emergency Medicine

## 2016-02-26 DIAGNOSIS — R0602 Shortness of breath: Secondary | ICD-10-CM | POA: Diagnosis present

## 2016-02-26 DIAGNOSIS — R319 Hematuria, unspecified: Secondary | ICD-10-CM | POA: Insufficient documentation

## 2016-02-26 DIAGNOSIS — I10 Essential (primary) hypertension: Secondary | ICD-10-CM | POA: Diagnosis not present

## 2016-02-26 DIAGNOSIS — Z79899 Other long term (current) drug therapy: Secondary | ICD-10-CM | POA: Diagnosis not present

## 2016-02-26 DIAGNOSIS — F17210 Nicotine dependence, cigarettes, uncomplicated: Secondary | ICD-10-CM | POA: Diagnosis not present

## 2016-02-26 DIAGNOSIS — H9203 Otalgia, bilateral: Secondary | ICD-10-CM | POA: Insufficient documentation

## 2016-02-26 DIAGNOSIS — Z791 Long term (current) use of non-steroidal anti-inflammatories (NSAID): Secondary | ICD-10-CM | POA: Insufficient documentation

## 2016-02-26 DIAGNOSIS — J189 Pneumonia, unspecified organism: Secondary | ICD-10-CM | POA: Diagnosis not present

## 2016-02-26 LAB — COMPREHENSIVE METABOLIC PANEL
ALK PHOS: 97 U/L (ref 38–126)
ALT: 15 U/L (ref 14–54)
AST: 20 U/L (ref 15–41)
Albumin: 4.2 g/dL (ref 3.5–5.0)
Anion gap: 8 (ref 5–15)
BILIRUBIN TOTAL: 0.2 mg/dL — AB (ref 0.3–1.2)
BUN: 18 mg/dL (ref 6–20)
CALCIUM: 8.3 mg/dL — AB (ref 8.9–10.3)
CO2: 23 mmol/L (ref 22–32)
CREATININE: 0.87 mg/dL (ref 0.44–1.00)
Chloride: 105 mmol/L (ref 101–111)
Glucose, Bld: 87 mg/dL (ref 65–99)
Potassium: 4.2 mmol/L (ref 3.5–5.1)
Sodium: 136 mmol/L (ref 135–145)
TOTAL PROTEIN: 7.2 g/dL (ref 6.5–8.1)

## 2016-02-26 LAB — CBC WITH DIFFERENTIAL/PLATELET
Basophils Absolute: 0 10*3/uL (ref 0.0–0.1)
Basophils Relative: 1 %
Eosinophils Absolute: 0.2 10*3/uL (ref 0.0–0.7)
Eosinophils Relative: 3 %
HEMATOCRIT: 38.6 % (ref 36.0–46.0)
HEMOGLOBIN: 12.4 g/dL (ref 12.0–15.0)
LYMPHS ABS: 1.7 10*3/uL (ref 0.7–4.0)
LYMPHS PCT: 33 %
MCH: 31.3 pg (ref 26.0–34.0)
MCHC: 32.1 g/dL (ref 30.0–36.0)
MCV: 97.5 fL (ref 78.0–100.0)
MONOS PCT: 14 %
Monocytes Absolute: 0.7 10*3/uL (ref 0.1–1.0)
NEUTROS PCT: 49 %
Neutro Abs: 2.5 10*3/uL (ref 1.7–7.7)
Platelets: 205 10*3/uL (ref 150–400)
RBC: 3.96 MIL/uL (ref 3.87–5.11)
RDW: 13.3 % (ref 11.5–15.5)
WBC: 5.1 10*3/uL (ref 4.0–10.5)

## 2016-02-26 LAB — URINALYSIS, ROUTINE W REFLEX MICROSCOPIC
Bilirubin Urine: NEGATIVE
Glucose, UA: NEGATIVE mg/dL
KETONES UR: NEGATIVE mg/dL
LEUKOCYTES UA: NEGATIVE
NITRITE: NEGATIVE
PH: 6 (ref 5.0–8.0)
Protein, ur: 30 mg/dL — AB
SPECIFIC GRAVITY, URINE: 1.02 (ref 1.005–1.030)

## 2016-02-26 LAB — URINE MICROSCOPIC-ADD ON

## 2016-02-26 LAB — TROPONIN I: Troponin I: <0.03 ng/mL (ref <0.03)

## 2016-02-26 LAB — BRAIN NATRIURETIC PEPTIDE: B Natriuretic Peptide: 35 pg/mL (ref 0.0–100.0)

## 2016-02-26 LAB — D-DIMER, QUANTITATIVE (NOT AT ARMC): D DIMER QUANT: 0.72 ug{FEU}/mL — AB (ref 0.00–0.50)

## 2016-02-26 MED ORDER — DOXYCYCLINE HYCLATE 100 MG PO CAPS: 100.0000 mg | ORAL_CAPSULE | Freq: Two times a day (BID) | ORAL | 0 refills | Status: DC

## 2016-02-26 MED ORDER — DOXYCYCLINE HYCLATE 100 MG PO TABS
100.0000 mg | ORAL_TABLET | Freq: Once | ORAL | Status: AC
  Administered 2016-02-26: 100 mg via ORAL
  Filled 2016-02-26: qty 1

## 2016-02-26 MED ORDER — SODIUM CHLORIDE 0.9 % IV BOLUS (SEPSIS)
1000.0000 mL | Freq: Once | INTRAVENOUS | Status: AC
  Administered 2016-02-26: 1000 mL via INTRAVENOUS

## 2016-02-26 MED ORDER — IOPAMIDOL (ISOVUE-370) INJECTION 76%
100.0000 mL | Freq: Once | INTRAVENOUS | Status: AC | PRN
  Administered 2016-02-26: 100 mL via INTRAVENOUS

## 2016-02-26 NOTE — Discharge Instructions (Signed)
Take the antibiotics as prescribed. Followup with your doctor. Return to the ED if you develop new or worsening symtpoms.

## 2016-02-26 NOTE — ED Provider Notes (Signed)
[facility] DEPT Provider Note   CSN: [id number] Arrival date & time: 02/26/16  1448     History   Chief Complaint Chief Complaint  Patient presents with  . Otalgia  . Cough    HPI Connie Wood is a 75 y.o. female.  Patient states she has not felt well since she had a colonoscopy 3 weeks ago and has breathing problems and shortness of breath since then. She states for the past 3 days she said nasal congestion, cough productive of white mucus and congestion her chest. 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 She takes ofloxacin on a daily basis for her ear issues. She sees Dr. Benjamine Mola of ENT who she reports was unable to give her a diagnosis. She reports a fever to 102 days ago. Reports she's been eating and drinking well. No vomiting or diarrhea. Denies chest pain. Denies abdominal pain. Denies any sick contacts. She is concerned that she might have pneumonia. Denies any history of asthma or COPD. No hx of CHF or MI.   The history is provided by the patient.  Otalgia  Associated symptoms include cough. Pertinent negatives include no headaches, no abdominal pain, no vomiting and no rash.  Cough  Associated symptoms include ear pain, myalgias and shortness of breath. Pertinent negatives include no chest pain and no headaches.    Past Medical History:  Diagnosis Date  . Back pain   . Cat scratch of right forearm 02/03/2014  . Hard of hearing   . Hemorrhoid 04/06/2014  . Hx of degenerative disc disease   . Hypercholesteremia 02/03/2014  . Hypercholesterolemia   . Hypertension   . Kidney stones   . Migraines   . Osteoarthritis   . Stroke [facility])    mini stroke, resolved in 30 minutes    Patient Active Problem List   Diagnosis Date Noted  . History of colonic polyps   . Encounter for screening colonoscopy 12/04/2015  . Common bile duct dilation 08/24/2014  . Acute pancreatitis   . H/O acute  pancreatitis 07/11/2014  . Anal itching 04/06/2014  . Hemorrhoid 04/06/2014  . Hypertension 02/03/2014  . Hypercholesteremia 02/03/2014  . Cat scratch of right forearm 02/03/2014  . Dyspnea 07/12/2013  . Bronchitis 07/11/2013  . Acute bronchitis 07/11/2013  . Nonhealing nonsurgical wound 04/05/2013  . Stasis edema 04/05/2013    Past Surgical History:  Procedure Laterality Date  . ABDOMINAL HYSTERECTOMY    . ANTERIOR AND POSTERIOR REPAIR    . APPENDECTOMY    . BILATERAL SALPINGOOPHORECTOMY    . BREAST SURGERY    . CHOLECYSTECTOMY    . colonoscopy  2003   Dr. Gala Romney: normal rectum, scattered diverticula  . COLONOSCOPY WITH PROPOFOL N/A 01/01/2016   Procedure: COLONOSCOPY WITH PROPOFOL;  Surgeon: Daneil Dolin, MD;  Location: [facility];  Service: Endoscopy;  Laterality: N/A;  1015  . ERCP with sphincterotomy  2003   Dr. Gala Romney: balloon dredging of bile duct, normal ampulla, choledocholithiasis   . EUS  2016   Dr. Amalia Hailey: subtle 13X17 mm heterogenous lesion in pancreas neck, FNA negative for malignancy   . POLYPECTOMY  01/01/2016   Procedure: POLYPECTOMY;  Surgeon: Daneil Dolin, MD;  Location: [facility] END[facility] SU[facility];  Service: Endoscopy;;   polypectomies x2-splenic flexure and cecal  . TONSILLECTOMY      OB History    Gravida Para Term Preterm AB Living   5 3  2 3   SAB TAB Ectopic Multiple Live Births   2               Home Medications    Prior to Admission medications   Medication Sig Start Date End Date Taking? Authorizing Provider  AZOR 10-40 MG per tablet Take 1 tablet by mouth daily. Reported on 12/04/2015 07/06/13   Historical Provider, MD  Calcium Citrate (CITRACAL PO) Take 1 tablet by mouth 3 (three) times daily.    Historical Provider, MD  Cholecalciferol (VITAMIN D PO) Take 1 tablet by mouth daily.    Historical Provider, MD  clorazepate (TRANXENE) 15 MG tablet Take 7.5 mg by mouth daily.  07/01/13   Historical Provider, MD  esomeprazole (NEXIUM) 40 MG capsule Take 1  capsule (40 mg total) by mouth daily. 10/28/12   Milton Ferguson, MD  HYDROcodone-acetaminophen (NORCO) 10-325 MG tablet Take 1 tablet by mouth every 6 (six) hours as needed.    Historical Provider, MD  hydrOXYzine (ATARAX/VISTARIL) 25 MG tablet Take 50-100 mg by mouth every 4 (four) hours as needed for itching.    Historical Provider, MD  ibuprofen (ADVIL,MOTRIN) 200 MG tablet Take 400 mg by mouth every 6 (six) hours as needed for headache or moderate pain.    Historical Provider, MD  lactulose (CHRONULAC) 10 GM/15ML solution Take 10 g by mouth daily as needed for mild constipation.    Historical Provider, MD  LORazepam (ATIVAN) 1 MG tablet Take 1-1.5 tablets by mouth 3 times/day as needed-between meals & bedtime for sleep.  11/23/14   Historical Provider, MD  ofloxacin (OCUFLOX) 0.3 % ophthalmic solution Place 2 drops into the right ear daily as needed (for pain). 2 drops in right ear daily prn. 01/14/14   Historical Provider, MD  polyethylene glycol-electrolytes (NULYTELY/GOLYTELY) 420 g solution Take 4,000 mLs by mouth once. 12/04/15   Annitta Needs, NP  venlafaxine XR (EFFEXOR-XR) 150 MG 24 hr capsule Take 150 mg by mouth daily.  07/03/13   Historical Provider, MD    Family History Family History  Problem Relation Age of Onset  . Heart disease Mother   . Cancer Father     liver  . Cancer Son   . Heart disease Son   . Cancer Maternal Grandmother   . Colon cancer Neg Hx     Social History Social History  Substance Use Topics  . Smoking status: Current Every Day Smoker    Packs/day: 1.00    Years: 50.00    Types: Cigarettes  . Smokeless tobacco: Never Used  . Alcohol use No     Allergies   Adhesive [tape] and Latex   Review of Systems Review of Systems  Constitutional: Positive for fever. Negative for activity change and appetite change.  HENT: Positive for ear pain.   Respiratory: Positive for cough, chest tightness and shortness of breath.   Cardiovascular: Negative for chest  pain.  Gastrointestinal: Negative for abdominal pain, nausea and vomiting.  Genitourinary: Negative for dysuria, hematuria, vaginal bleeding and vaginal discharge.  Musculoskeletal: Positive for myalgias. Negative for arthralgias.  Skin: Negative for rash.  Neurological: Negative for dizziness, weakness, light-headedness and headaches.   A complete 10 system review of systems was obtained and all systems are negative except as noted in the HPI and PMH.    Physical Exam Updated Vital Signs BP 124/80 (BP Location: Left Arm)   Pulse (!) 122   Temp 98.2 F (36.8 C) (Oral)   Resp 14   Ht  5\' 1"  (1.549 m)   Wt 128 lb (58.1 kg)   SpO2 97%   BMI 24.19 kg/m   Physical Exam  Constitutional: She is oriented to person, place, and time. She appears well-developed and well-nourished. No distress.  HENT:  Head: Normocephalic and atraumatic.  Mouth/Throat: Oropharynx is clear and moist. No oropharyngeal exudate.  Eardrums are not visible. Bilateral ear canals have white exudate with some clear drainage. There is no erythema. There is no pain with palpation of the tragus or mastoid.  Eyes: Conjunctivae and EOM are normal. Pupils are equal, round, and reactive to light.  Neck: Normal range of motion. Neck supple.  No meningismus.  Cardiovascular: Normal rate, normal heart sounds and intact distal pulses.   No murmur heard. Tachycardic to 110s  Pulmonary/Chest: Effort normal and breath sounds normal. No respiratory distress. She exhibits no tenderness.  Scattered rhonchi  Abdominal: Soft. There is no tenderness. There is no rebound and no guarding.  Musculoskeletal: Normal range of motion. She exhibits no edema or tenderness.  Neurological: She is alert and oriented to person, place, and time. No cranial nerve deficit. She exhibits normal muscle tone. Coordination normal.  No ataxia on finger to nose bilaterally. No pronator drift. 5/5 strength throughout. CN 2-12 intact.Equal grip strength.  Sensation intact.   Skin: Skin is warm.  Psychiatric: She has a normal mood and affect. Her behavior is normal.  Nursing note and vitals reviewed.    ED Treatments / Results  Labs (all labs ordered are listed, but only abnormal results are displayed) Labs Reviewed  COMPREHENSIVE METABOLIC PANEL - Abnormal; Notable for the following:       Result Value   Calcium 8.3 (*)    Total Bilirubin 0.2 (*)    All other components within normal limits  D-DIMER, QUANTITATIVE (NOT AT [facility]) - Abnormal; Notable for the following:    D-Dimer, Quant 0.72 (*)    All other components within normal limits  URINALYSIS, ROUTINE W REFLEX MICROSCOPIC (NOT AT [facility]) - Abnormal; Notable for the following:    Hgb urine dipstick LARGE (*)    Protein, ur 30 (*)    All other components within normal limits  URINE MICROSCOPIC-ADD ON - Abnormal; Notable for the following:    Squamous Epithelial / LPF 0-5 (*)    Bacteria, UA FEW (*)    All other components within normal limits  URINE CULTURE  CBC WITH DIFFERENTIAL/PLATELET  TROPONIN I  BRAIN NATRIURETIC PEPTIDE  TROPONIN I    EKG  EKG Interpretation  Date/Time:  Monday February 26 2016 17:01:34 EDT Ventricular Rate:  82 PR Interval:    QRS Duration: 94 QT Interval:  400 QTC Calculation: 468 R Axis:   79 Text Interpretation:  Sinus rhythm No significant change was found Confirmed by Wyvonnia Dusky  MD, Seng Fouts [phone]) on 02/26/2016 5:18:46 PM       Radiology Dg Chest 2 View  Result Date: 02/26/2016 CLINICAL DATA:  Nasal congestion. EXAM: CHEST  2 VIEW COMPARISON:  05/03/2015 FINDINGS: The heart size is normal. Stable tortuosity and prominence of the thoracic aorta. There is no evidence of pulmonary edema, consolidation, pneumothorax, nodule or pleural fluid. The visualized skeletal structures are unremarkable. IMPRESSION: No active cardiopulmonary disease. Electronically Signed   By: Aletta Edouard M.D.   On: 02/26/2016 16:00   Ct Angio Chest Pe W And/or  Wo Contrast  Result Date: 02/26/2016 CLINICAL DATA:  Bilateral ear pain and congestion. Shortness of breath for 1 day. EXAM: CT ANGIOGRAPHY  CHEST WITH CONTRAST TECHNIQUE: Multidetector CT imaging of the chest was performed using the standard protocol during bolus administration of intravenous contrast. Multiplanar CT image reconstructions and MIPs were obtained to evaluate the vascular anatomy. CONTRAST:  100 mL of Isovue 370 COMPARISON:  Chest x-ray from earlier today FINDINGS: The central airways are within normal limits. No pneumothorax. A small region of increase attenuation in the left upper lobe on series 7, image 55 and coronal image 35 is nonspecific. Other scattered regions of subsegmental atelectasis are identified as well. No overt pulmonary edema. No suspicious pulmonary nodules or masses. No other focal infiltrate. Evaluation for pulmonary emboli is mildly limited in the bases due to respiratory motion. Within this limitation, no emboli are identified. The thoracic aorta is normal in caliber with no dissection. Atherosclerotic changes are seen. No effusions. The heart is unremarkable. Coronary artery calcifications are identified. No adenopathy. Evaluation of the upper abdomen is limited but unremarkable. Degenerative changes are seen in the spine with no other significant bony abnormalities. Review of the MIP images confirms the above findings. IMPRESSION: 1. No pulmonary emboli identified. 2. There is a small region a ground-glass opacity in the left upper lobe which is nonspecific. This could be a small region of infection or inflammation. However, a small region of subsegmental atelectasis could have a similar appearance. Electronically Signed   By: Dorise Bullion III M.D   On: 02/26/2016 18:38   Ct Renal Stone Study  Result Date: 02/26/2016 CLINICAL DATA:  Hematuria for 3 days. EXAM: CT ABDOMEN AND PELVIS WITHOUT CONTRAST TECHNIQUE: Multidetector CT imaging of the abdomen and pelvis was  performed following the standard protocol without IV contrast. COMPARISON:  CT abdomen and pelvis 11/10/2015. FINDINGS: Calcific coronary and aortic atherosclerosis is noted. No pleural or pericardial effusion. Heart size is normal. Lung bases are otherwise unremarkable. Bilateral nonobstructing renal stones are identified with 2 stones in the right kidney and 4-5 stones in the left kidney. There is no hydronephrosis on the right or left and no ureteral stones are seen. One of the largest stones is in the lower pole of the right kidney measuring 0.7 cm. Coarse calcifications along the posterior right hepatic lobe are unchanged. The gallbladder has been removed. The spleen and pancreas are unremarkable. Unchanged prominence of the common bile duct is noted. Fullness of the adrenal glands compatible hyperplasia is more notable on the right and unchanged. Extensive aortoiliac atherosclerosis without aneurysm is identified. The patient is status post hysterectomy. Duodenal diverticulum is noted. The small bowel is otherwise unremarkable. The stomach and colon appear normal. No evidence of appendicitis. No lymphadenopathy or fluid. The patient is status post hysterectomy. Bones demonstrate severe convex right scoliosis and multilevel spondylosis. IMPRESSION: No acute abnormality abdomen or pelvis. Bilateral nonobstructing renal stones. Calcific aortic and coronary atherosclerosis. Electronically Signed   By: Inge Rise M.D.   On: 02/26/2016 18:49    Procedures Procedures (including critical care time)  Medications Ordered in ED Medications  sodium chloride 0.9 % bolus 1,000 mL (not administered)     Initial Impression / Assessment and Plan / ED Course  I have reviewed the triage vital signs and the nursing notes.  Pertinent labs & imaging results that were available during my care of the patient were reviewed by me and considered in my medical decision making (see chart for details).  Clinical  Course  3 days of progressively worsening cough, nasal congestion, ear fullness, chest congestion. No distress speaking in full sentences. Good air  exchange without wheezing. Tachycardic but used a nebulizer at home.  Troponin negative. UA with hematuria which she has had previously Chest x-ray negative for pneumonia. Will obtain CTPE given recent procedure, SOB, tachycardia.  Patient in  no respiratory distress. Troponin is negative. CT does not show any pulmonary embolism but does show possible groundglass opacity in left upper lobe. We'll treat his pneumonia given her cough. HR has improved. She is not hypoxic. Troponin negative x2. Treat possible PNA with doxycycline. Followup with her PCP and ENT for her ear issues. Return precautions discussed.  BP 164/97   Pulse 93   Temp 98.2 F (36.8 C) (Oral)   Resp 17   Ht 5\' 1"  (1.549 m)   Wt 128 lb (58.1 kg)   SpO2 96%   BMI 24.19 kg/m    Final Clinical Impressions(s) / ED Diagnoses   Final diagnoses:  Hematuria  CAP (community acquired pneumonia)    New Prescriptions New Prescriptions   No medications on file     Ezequiel Essex, MD 02/27/16 1318

## 2016-02-26 NOTE — ED Triage Notes (Signed)
Pt reports bilateral ear pain with nasal congestion since Friday.

## 2016-02-28 LAB — URINE CULTURE

## 2016-03-03 ENCOUNTER — Encounter (HOSPITAL_COMMUNITY): Payer: Self-pay | Admitting: Emergency Medicine

## 2016-03-03 ENCOUNTER — Emergency Department (HOSPITAL_COMMUNITY)
Admission: EM | Admit: 2016-03-03 | Discharge: 2016-03-04 | Disposition: A | Discharge status: Home / Self Care | Payer: Medicare Other | Attending: Emergency Medicine | Admitting: Emergency Medicine

## 2016-03-03 DIAGNOSIS — Z79899 Other long term (current) drug therapy: Secondary | ICD-10-CM | POA: Insufficient documentation

## 2016-03-03 DIAGNOSIS — F17210 Nicotine dependence, cigarettes, uncomplicated: Secondary | ICD-10-CM | POA: Diagnosis not present

## 2016-03-03 DIAGNOSIS — Z791 Long term (current) use of non-steroidal anti-inflammatories (NSAID): Secondary | ICD-10-CM | POA: Insufficient documentation

## 2016-03-03 DIAGNOSIS — Z8673 Personal history of transient ischemic attack (TIA), and cerebral infarction without residual deficits: Secondary | ICD-10-CM | POA: Insufficient documentation

## 2016-03-03 DIAGNOSIS — R0602 Shortness of breath: Secondary | ICD-10-CM | POA: Diagnosis present

## 2016-03-03 DIAGNOSIS — I10 Essential (primary) hypertension: Secondary | ICD-10-CM | POA: Diagnosis not present

## 2016-03-03 DIAGNOSIS — J189 Pneumonia, unspecified organism: Secondary | ICD-10-CM | POA: Diagnosis not present

## 2016-03-03 DIAGNOSIS — R062 Wheezing: Secondary | ICD-10-CM

## 2016-03-03 MED ORDER — METHYLPREDNISOLONE SODIUM SUCC 125 MG IJ SOLR
125.0000 mg | Freq: Once | INTRAMUSCULAR | Status: AC
  Administered 2016-03-04: 125 mg via INTRAVENOUS
  Filled 2016-03-03: qty 2

## 2016-03-03 MED ORDER — IPRATROPIUM-ALBUTEROL 0.5-2.5 (3) MG/3ML IN SOLN
3.0000 mL | Freq: Once | RESPIRATORY_TRACT | Status: AC
  Administered 2016-03-04: 3 mL via RESPIRATORY_TRACT
  Filled 2016-03-03: qty 3

## 2016-03-03 MED ORDER — DEXTROSE 5 % IV SOLN
500.0000 mg | Freq: Once | INTRAVENOUS | Status: AC
  Administered 2016-03-04: 500 mg via INTRAVENOUS
  Filled 2016-03-03: qty 500

## 2016-03-03 MED ORDER — DEXTROSE 5 % IV SOLN
1.0000 g | Freq: Once | INTRAVENOUS | Status: AC
  Administered 2016-03-04: 1 g via INTRAVENOUS
  Filled 2016-03-03: qty 10

## 2016-03-03 NOTE — ED Triage Notes (Signed)
Pt reports difficulty breathing, states she was recently dx with pneumonia and has been on abx.

## 2016-03-03 NOTE — ED Provider Notes (Signed)
By signing my name below, I, Connie Wood, attest that this documentation has been prepared under the direction and in the presence of [place], DO. Electronically Signed: Reola Wood, ED Scribe. 03/03/16. 11:12 PM.  TIME SEEN: 11:54 PM  CHIEF COMPLAINT:  Chief Complaint  Patient presents with  . Shortness of Breath   HPI Comments: Connie Wood is a 75 y.o. female with history of hypertension, hyperlipidemia, CVA, tobacco abuse who presents to the Emergency Department complaining of gradual onset, gradually worsening SOB onset ~4 weeks ago, worsening over the past ~6 days. Associated symptoms include cough, chills, night sweats, decreased appetite, emesis, and nausea secondary to her SOB. Pt was seen ~6 days ago for same in the ED and was at that time a CT of her chest was performed which was remarkable for upper, left lobe PNA. Negative for pulmonary embolus. She was rx'd Doxycycline at that time, which she has been taking compliantly. Since beginning this medication she states that it has made her increasingly more sick, including nausea and vomiting. Her daughter notes that her SOB is exacerbated with mild exertion, no chest pain. Had 2 negative troponins during her last visit. She has not seen her PCP for this problem because she states that she recently transferred to a new provider. Pt occasionally smokes. Pt is not on O2 therapy at home. No hx of asthma or COPD. Denies fever, or any other associated symptoms.   PCP: Wende Neighbors, MD; formerly Jani Gravel, MD  ROS: See HPI Constitutional: no fever  Eyes: no drainage  ENT: no runny nose   Cardiovascular:  no chest pain  Resp: SOB  GI: vomiting GU: no dysuria Integumentary: no rash  Allergy: no hives  Musculoskeletal: no leg swelling  Neurological: no slurred speech ROS otherwise negative  PAST MEDICAL HISTORY/PAST SURGICAL HISTORY:  Past Medical History:  Diagnosis Date  . Back pain   . Cat scratch of  right forearm 02/03/2014  . Hard of hearing   . Hemorrhoid 04/06/2014  . Hx of degenerative disc disease   . Hypercholesteremia 02/03/2014  . Hypercholesterolemia   . Hypertension   . Kidney stones   . Migraines   . Osteoarthritis   . Stroke [facility])    mini stroke, resolved in 30 minutes   MEDICATIONS:  Prior to Admission medications   Medication Sig Start Date End Date Taking? Authorizing Provider  AZOR 10-40 MG per tablet Take 1 tablet by mouth daily. Reported on 12/04/2015 07/06/13   Historical Provider, MD  Calcium Citrate (CITRACAL PO) Take 1 tablet by mouth every evening.     Historical Provider, MD  Cholecalciferol (VITAMIN D PO) Take 1 tablet by mouth every evening.     Historical Provider, MD  doxycycline (VIBRAMYCIN) 100 MG capsule Take 1 capsule (100 mg total) by mouth 2 (two) times daily. 02/26/16   Ezequiel Essex, MD  esomeprazole (NEXIUM) 40 MG capsule Take 1 capsule (40 mg total) by mouth daily. 10/28/12   Milton Ferguson, MD  HYDROcodone-acetaminophen (NORCO) 10-325 MG tablet Take 1 tablet by mouth every 6 (six) hours as needed for moderate pain or severe pain.     Historical Provider, MD  hydrOXYzine (ATARAX/VISTARIL) 25 MG tablet Take 50-100 mg by mouth every 4 (four) hours as needed for itching.    Historical Provider, MD  ibuprofen (ADVIL,MOTRIN) 200 MG tablet Take 400 mg by mouth every 6 (six) hours as needed for headache or moderate pain.    Historical Provider, MD  LORazepam (ATIVAN) 1 MG tablet Take 1 mg by mouth at bedtime.  11/23/14   Historical Provider, MD  ofloxacin (OCUFLOX) 0.3 % ophthalmic solution Place 2 drops into the right ear daily as needed (for pain). 2 drops in right ear daily prn. 01/14/14   Historical Provider, MD  venlafaxine XR (EFFEXOR-XR) 75 MG 24 hr capsule Take 75 mg by mouth 2 (two) times daily. 02/02/16   Historical Provider, MD   ALLERGIES:  Allergies  Allergen Reactions  . Adhesive [Tape] Other (See Comments)    Tears skin  . Latex Itching and  Rash   SOCIAL HISTORY:  Social History  Substance Use Topics  . Smoking status: Current Every Day Smoker    Packs/day: 1.00    Years: 50.00    Types: Cigarettes  . Smokeless tobacco: Never Used  . Alcohol use No   FAMILY HISTORY: Family History  Problem Relation Age of Onset  . Heart disease Mother   . Cancer Father     liver  . Cancer Son   . Heart disease Son   . Cancer Maternal Grandmother   . Colon cancer Neg Hx    EXAM: BP (!) 186/103 (BP Location: Left Arm)   Pulse 105   Temp 98.8 F (37.1 C)   Resp 22   Ht 5' 1.5" (1.562 m)   Wt 128 lb (58.1 kg)   SpO2 95%   BMI 23.79 kg/m  CONSTITUTIONAL: Alert and oriented and responds appropriately to questions. Well-nourished; elderly; nontoxic appearing; afebrile HEAD: Normocephalic EYES: Conjunctivae clear, PERRL ENT: normal nose; no rhinorrhea; moist mucous membranes NECK: Supple, no meningismus, no LAD  CARD: RRR; S1 and S2 appreciated; no murmurs, no clicks, no rubs, no gallops RESP: Normal chest excursion without splinting or tachypnea; Respirations are equal, expiratory wheezes throughout and diminished breath sounds at bases bilaterally, no rhonchi, no rales, no hypoxia or respiratory distress, speaking full sentences ABD/GI: Normal bowel sounds; non-distended; soft, non-tender, no rebound, no guarding, no peritoneal signs BACK:  The back appears normal and is non-tender to palpation, there is no CVA tenderness EXT: Normal ROM in all joints; non-tender to palpation; no edema; normal capillary refill; no cyanosis, no calf tenderness or swelling    SKIN: Normal color for age and race; warm; no rash NEURO: Moves all extremities equally, sensation to light touch intact diffusely, cranial nerves II through XII intact PSYCH: The patient's mood and manner are appropriate. Grooming and personal hygiene are appropriate.  MEDICAL DECISION MAKING: Patient here with shortness of breath. Recently diagnosed with pneumonia. Suspect  this is compounded by the fact that she is having some bronchospasm that she is wheezing today. No hypoxia or respiratory distress. I have witnessed patient walking down the hallway with her daughter and she does not seem to be in any distress. She is able to ambulate in the emergency department her oxygen does not drop below 94%. She does have some wheezing today. We'll give breathing treatments, Solu-Medrol. I suspect that she is not feeling better because she is unable to tolerate doxycycline that she states it causes her to vomit. We'll list this in turn is an intolerance. We'll switch her to azithromycin. We'll repeat labs today, chest x-ray. EKG shows no new ischemic abnormality.  ED PROGRESS: Chest x-ray is unremarkable. She does not appear volume overloaded. BNP is normal. Troponin negative. Given 2 breathing treatments and she reports feeling much better. No hypoxia today in the emergency department. I feel she is safe to go  home with albuterol inhaler, prednisone burst and a prescription for azithromycin. She is comfortable with this plan as well as her daughter and they will follow-up with her PCP. Discussed return precautions.   At this time, I do not feel there is any life-threatening condition present. I have reviewed and discussed all results (EKG, imaging, lab, urine as appropriate), exam findings with patient/family. I have reviewed nursing notes and appropriate previous records.  I feel the patient is safe to be discharged home without further emergent workup and can continue workup as an outpatient as needed. Discussed usual and customary return precautions. Patient/family verbalize understanding and are comfortable with this plan.  Outpatient follow-up has been provided. All questions have been answered.     Date: 03/04/2016 00:10  Rate: 91  Rhythm: normal sinus rhythm  QRS Axis: normal  Intervals: normal  ST/T Wave abnormalities: normal  Conduction Disutrbances: none  Narrative  Interpretation: unremarkable; no change compared to prior     I personally performed the services described in this documentation, which was scribed in my presence. The recorded information has been reviewed and is accurate.      [place], DO 03/04/16 [phone]

## 2016-03-04 ENCOUNTER — Other Ambulatory Visit: Payer: Self-pay | Admitting: Adult Health

## 2016-03-04 ENCOUNTER — Emergency Department (HOSPITAL_COMMUNITY): Payer: Medicare Other

## 2016-03-04 LAB — CBC WITH DIFFERENTIAL/PLATELET
Basophils Absolute: 0.1 10*3/uL (ref 0.0–0.1)
Basophils Relative: 0 %
EOS ABS: 0.1 10*3/uL (ref 0.0–0.7)
EOS PCT: 1 %
HCT: 38.8 % (ref 36.0–46.0)
Hemoglobin: 13 g/dL (ref 12.0–15.0)
Lymphocytes Relative: 29 %
Lymphs Abs: 3.3 10*3/uL (ref 0.7–4.0)
MCH: 31.4 pg (ref 26.0–34.0)
MCHC: 33.5 g/dL (ref 30.0–36.0)
MCV: 93.7 fL (ref 78.0–100.0)
Monocytes Absolute: 1.1 10*3/uL — ABNORMAL HIGH (ref 0.1–1.0)
Monocytes Relative: 10 %
NEUTROS ABS: 6.8 10*3/uL (ref 1.7–7.7)
NEUTROS PCT: 60 %
PLATELETS: 261 10*3/uL (ref 150–400)
RBC: 4.14 MIL/uL (ref 3.87–5.11)
RDW: 13 % (ref 11.5–15.5)
WBC: 11.2 10*3/uL — ABNORMAL HIGH (ref 4.0–10.5)

## 2016-03-04 LAB — BASIC METABOLIC PANEL
Anion gap: 9 (ref 5–15)
BUN: 23 mg/dL — AB (ref 6–20)
CO2: 23 mmol/L (ref 22–32)
CREATININE: 0.79 mg/dL (ref 0.44–1.00)
Calcium: 8.6 mg/dL — ABNORMAL LOW (ref 8.9–10.3)
Chloride: 107 mmol/L (ref 101–111)
GFR calc Af Amer: >60 mL/min (ref >60)
GLUCOSE: 116 mg/dL — AB (ref 65–99)
Potassium: 3.8 mmol/L (ref 3.5–5.1)
SODIUM: 139 mmol/L (ref 135–145)

## 2016-03-04 LAB — TROPONIN I

## 2016-03-04 LAB — BRAIN NATRIURETIC PEPTIDE: B Natriuretic Peptide: 55 pg/mL (ref 0.0–100.0)

## 2016-03-04 MED ORDER — ALBUTEROL SULFATE HFA 108 (90 BASE) MCG/ACT IN AERS
2.0000 | INHALATION_SPRAY | Freq: Once | RESPIRATORY_TRACT | Status: AC
  Administered 2016-03-04: 2 via RESPIRATORY_TRACT
  Filled 2016-03-04: qty 6.7

## 2016-03-04 MED ORDER — AZITHROMYCIN 250 MG PO TABS: 250.0000 mg | ORAL_TABLET | Freq: Every day | ORAL | 0 refills | Status: DC

## 2016-03-04 MED ORDER — IPRATROPIUM-ALBUTEROL 0.5-2.5 (3) MG/3ML IN SOLN
3.0000 mL | Freq: Once | RESPIRATORY_TRACT | Status: AC
  Administered 2016-03-04: 3 mL via RESPIRATORY_TRACT
  Filled 2016-03-04: qty 3

## 2016-03-04 MED ORDER — BENZONATATE 100 MG PO CAPS: 100.0000 mg | ORAL_CAPSULE | Freq: Three times a day (TID) | ORAL | 0 refills | Status: DC | PRN

## 2016-03-04 MED ORDER — PREDNISONE 20 MG PO TABS: 60.0000 mg | ORAL_TABLET | Freq: Every day | ORAL | 0 refills | Status: DC

## 2016-03-04 NOTE — ED Notes (Signed)
Patient ambulated in department. Patient uses assistance of a cane. Patient 02 sat 94 percent room air. Patient's heart rate 128 and respirations 30. Patient states that she felt tired and short of breath. Returned patient to room, patient receiving breathing treatment at this time. Dr Leonides Schanz made aware.

## 2016-03-11 LAB — CULTURE, BLOOD (ROUTINE X 2)
Culture: NO GROWTH
Culture: NO GROWTH

## 2016-03-27 ENCOUNTER — Other Ambulatory Visit (HOSPITAL_COMMUNITY): Payer: Self-pay | Admitting: Internal Medicine

## 2016-03-27 DIAGNOSIS — Z1231 Encounter for screening mammogram for malignant neoplasm of breast: Secondary | ICD-10-CM

## 2016-04-03 ENCOUNTER — Ambulatory Visit (HOSPITAL_COMMUNITY): Payer: Medicare Other

## 2016-04-10 ENCOUNTER — Ambulatory Visit (HOSPITAL_COMMUNITY)
Admission: RE | Admit: 2016-04-10 | Discharge: 2016-04-10 | Disposition: A | Discharge status: Home / Self Care | Payer: Medicare Other | Source: Ambulatory Visit | Attending: Internal Medicine | Admitting: Internal Medicine

## 2016-04-10 DIAGNOSIS — Z1231 Encounter for screening mammogram for malignant neoplasm of breast: Secondary | ICD-10-CM | POA: Insufficient documentation

## 2016-04-25 ENCOUNTER — Other Ambulatory Visit: Payer: Self-pay | Admitting: Physical Medicine and Rehabilitation

## 2016-04-25 DIAGNOSIS — M5136 Other intervertebral disc degeneration, lumbar region: Secondary | ICD-10-CM

## 2016-05-07 ENCOUNTER — Ambulatory Visit
Admission: RE | Admit: 2016-05-07 | Discharge: 2016-05-07 | Disposition: A | Discharge status: Home / Self Care | Payer: Medicare Other | Source: Ambulatory Visit | Attending: Physical Medicine and Rehabilitation | Admitting: Physical Medicine and Rehabilitation

## 2016-05-07 DIAGNOSIS — M5136 Other intervertebral disc degeneration, lumbar region: Secondary | ICD-10-CM

## 2016-05-27 ENCOUNTER — Ambulatory Visit (INDEPENDENT_AMBULATORY_CARE_PROVIDER_SITE_OTHER): Payer: Medicare Other | Admitting: Otolaryngology

## 2016-05-27 DIAGNOSIS — H7203 Central perforation of tympanic membrane, bilateral: Secondary | ICD-10-CM | POA: Diagnosis not present

## 2016-05-27 DIAGNOSIS — K219 Gastro-esophageal reflux disease without esophagitis: Secondary | ICD-10-CM | POA: Diagnosis not present

## 2016-05-27 DIAGNOSIS — R490 Dysphonia: Secondary | ICD-10-CM | POA: Diagnosis not present

## 2016-05-30 ENCOUNTER — Ambulatory Visit (INDEPENDENT_AMBULATORY_CARE_PROVIDER_SITE_OTHER): Payer: Medicare Other | Admitting: Adult Health

## 2016-05-30 ENCOUNTER — Encounter: Payer: Self-pay | Admitting: Adult Health

## 2016-05-30 VITALS — BP 150/60 | HR 84 | Ht 61.0 in | Wt 143.5 lb

## 2016-05-30 DIAGNOSIS — Z01419 Encounter for gynecological examination (general) (routine) without abnormal findings: Secondary | ICD-10-CM | POA: Diagnosis not present

## 2016-05-30 DIAGNOSIS — R61 Generalized hyperhidrosis: Secondary | ICD-10-CM

## 2016-05-30 DIAGNOSIS — Z1212 Encounter for screening for malignant neoplasm of rectum: Secondary | ICD-10-CM | POA: Diagnosis not present

## 2016-05-30 LAB — HEMOCCULT GUIAC POC 1CARD (OFFICE): FECAL OCCULT BLD: NEGATIVE

## 2016-05-30 MED ORDER — PAROXETINE MESYLATE 7.5 MG PO CAPS: ORAL_CAPSULE | ORAL | 3 refills | Status: DC

## 2016-05-30 NOTE — Patient Instructions (Signed)
Try evening primrose oil Follow up in 3 months

## 2016-05-30 NOTE — Progress Notes (Signed)
Patient ID: Connie Wood, female   DOB: [birth year], 75 y.o.   MRN: [id number] History of Present Illness:  Connie Wood is a 75 year old white female, widowed in for well woman gyn exam, she is sp hysterectomy and is complaining of night sweats and not sleeping well. PCP is Dr Nevada Crane.  Current Medications, Allergies, Past Medical History, Past Surgical History, Family History and Social History were reviewed in [employer] record.     Review of Systems:  Patient denies any headaches, hearing loss, fatigue, blurred vision, shortness of breath, chest pain, abdominal pain, problems with bowel movements, urination, or intercourse(not active). No joint pain or mood swings.Night sweats and does not sleep well.   Physical Exam:BP (!) 150/60 (BP Location: Left Arm, Patient Position: Sitting, Cuff Size: Normal)   Pulse 84   Ht 5\' 1"  (1.549 m)   Wt 143 lb 8 oz (65.1 kg)   BMI 27.11 kg/m  General:  Well developed, well nourished, no acute distress Skin:  Warm and dry Neck:  Midline trachea, normal thyroid, good ROM, no lymphadenopathy, no carotid bruits heard  Lungs; Clear to auscultation bilaterally Breast:  No dominant palpable mass, retraction, or nipple discharge Cardiovascular: Regular rate and rhythm Abdomen:  Soft, non tender, no hepatosplenomegaly Pelvic:  External genitalia is normal in appearance, no lesions.  The vagina is normal in appearance. Urethra has no lesions or masses. The cervix and uterus are absent.  No adnexal masses or tenderness noted.Bladder is non tender, no masses felt. Rectal: Good sphincter tone, no polyps, or hemorrhoids felt.  Hemoccult negative.+rectocele Extremities/musculoskeletal:  No swelling or varicosities noted, no clubbing or cyanosis, she wears back brace and walks with a cane. Psych:  No mood changes, alert and cooperative,seems happy PHQ 9 score 9,she denies any suicidal ideations, and  is on effexor, and sees Dr Nevada Crane Will try  brisdelle, discussed with Dr Elonda Husky. Impression: 1. Well woman exam with routine gynecological exam   2. Night sweats       Plan: Try evening primrose oil Rx brisdelle 7.5 mg #30 take 1 at hs with 3 refills Follow up in 3 months Mammogram yearly Colonoscopy per GI

## 2016-06-10 ENCOUNTER — Telehealth: Payer: Self-pay | Admitting: Internal Medicine

## 2016-06-10 NOTE — Telephone Encounter (Signed)
[phone] PLEASE CALL PATIENT, EVERYTHING SHE EATS GOES STRAIGHT THROUGH HER AND SHE NEVER KNOWS WHEN IT IS GOING TO COME OUT.

## 2016-06-10 NOTE — Telephone Encounter (Signed)
Spoke with the pt- she is having 2-3 normal bm's a day. No blood in her stool. She said as soon as she eats she has to go to the bathroom, but the stool is normal. She said it always feels like there is stool in her rectum. She is taking hydrocodone for DDD. She does take otc loperamide 2mg  when she goes to the grocery store because she never knows when it is going to happen. Pt was on Abx in September but no recent Abx. Her last tcs was in August and she feels like all of this started after her tcs.  Pt wants to know if there is anything she can do? Routing to AB in RMR absence.

## 2016-06-10 NOTE — Telephone Encounter (Signed)
Recommend OV as we haven't seen her in a few months.

## 2016-06-11 NOTE — Telephone Encounter (Signed)
MADE APPOINTMENT AND CALLED PATIENT WITH DATE AND TIME  °

## 2016-06-11 NOTE — Telephone Encounter (Signed)
Please schedule ov and let her know. 

## 2016-07-05 ENCOUNTER — Ambulatory Visit: Payer: Medicare Other | Admitting: Gastroenterology

## 2016-07-09 ENCOUNTER — Ambulatory Visit: Payer: Medicare Other | Admitting: Gastroenterology

## 2016-07-11 ENCOUNTER — Ambulatory Visit (INDEPENDENT_AMBULATORY_CARE_PROVIDER_SITE_OTHER): Payer: Medicare Other | Admitting: Otolaryngology

## 2016-07-17 ENCOUNTER — Encounter: Payer: Self-pay | Admitting: Nurse Practitioner

## 2016-07-17 ENCOUNTER — Ambulatory Visit (INDEPENDENT_AMBULATORY_CARE_PROVIDER_SITE_OTHER): Payer: Medicare Other | Admitting: Nurse Practitioner

## 2016-07-17 VITALS — BP 159/89 | HR 99 | Temp 97.6°F | Ht 61.0 in | Wt 151.6 lb

## 2016-07-17 DIAGNOSIS — Z8719 Personal history of other diseases of the digestive system: Secondary | ICD-10-CM

## 2016-07-17 DIAGNOSIS — K5900 Constipation, unspecified: Secondary | ICD-10-CM

## 2016-07-17 NOTE — Progress Notes (Signed)
Referring Provider: Celene Squibb, MD Primary Care Physician:  Wende Neighbors, MD Primary GI:  Dr. Gala Romney  Chief Complaint  Patient presents with  . Diarrhea    HPI:   Connie Wood is a 76 y.o. female who presents for loose stools. The patient was last seen in our office 12/04/2015 for history of acute pancreatitis and to schedule screening colonoscopy. Noted history of idiopathic pancreatitis status post EUS and 2016 with heterogenous lesion in the pancreas neck and FNA negative for malignancy. Pancreatic protocol several months with no pancreatic masses noted. CT completed May 2017 without mass, stable hypodensity in calcifications along right hepatic lobe capsule, stable intrahepatic and extrahepatic biliary dilation. She was due for colonoscopy at that time. No GI complaints at that time. She was scheduled for colonoscopy on propofol due to polypharmacy.  Colonoscopy completed 01/01/2016 which found diverticulosis in the sigmoid colon, single 11 mm polyp in the cecum status post removal with the aid of saline injection, diminutive cecal polyp status post ablation, 4 mm polyp at the splenic flexure. Otherwise normal. Surgical pathology as per below. Recommended continue present medications, repeat colonoscopy for surveillance in 3 years (2020).  Today she states she's doing ok. Has a lot of problems today. Talks about her chronic back pain, DJD, overweight. Talks about on/off cough and sore throat. States this all started after her colonoscopy. Also very difficult historian as she gets on tangents and is very difficult to redirect. Has not had any more episodes of acute pancreatitis. Denies abdominal pain, N/V, hematochezia, melena, fever, chills, unintentional weight loss. Has "weird bowels." She will have a normal bowel movement and then have to return to the bathroom soon after. Stools "are fine" and agree they're formed. Sometimes "teeny tiny balls" with maybe some constipation. Denies  diarrhea. This is impacting her quality of life as she's scared to go out for fear she'll have to go to the bathroom urgently. Denies chest pain, dyspnea, dizziness, lightheadedness, syncope, near syncope. Denies any other upper or lower GI symptoms.     Past Medical History:  Diagnosis Date  . Back pain   . Cat scratch of right forearm 02/03/2014  . Hard of hearing   . Hemorrhoid 04/06/2014  . Hx of degenerative disc disease   . Hypercholesteremia 02/03/2014  . Hypercholesterolemia   . Hypertension   . Kidney stones   . Migraines   . Osteoarthritis   . Stroke [facility])    mini stroke, resolved in 30 minutes    Past Surgical History:  Procedure Laterality Date  . ABDOMINAL HYSTERECTOMY    . ANTERIOR AND POSTERIOR REPAIR    . APPENDECTOMY    . BILATERAL SALPINGOOPHORECTOMY    . BREAST SURGERY    . CHOLECYSTECTOMY    . colonoscopy  2003   Dr. Gala Romney: normal rectum, scattered diverticula  . COLONOSCOPY WITH PROPOFOL N/A 01/01/2016   Procedure: COLONOSCOPY WITH PROPOFOL;  Surgeon: Daneil Dolin, MD;  Location: [facility];  Service: Endoscopy;  Laterality: N/A;  1015  . ERCP with sphincterotomy  2003   Dr. Gala Romney: balloon dredging of bile duct, normal ampulla, choledocholithiasis   . EUS  2016   Dr. Amalia Hailey: subtle 13X17 mm heterogenous lesion in pancreas neck, FNA negative for malignancy   . POLYPECTOMY  01/01/2016   Procedure: POLYPECTOMY;  Surgeon: Daneil Dolin, MD;  Location: [facility];  Service: Endoscopy;;   polypectomies x2-splenic flexure and cecal  . TONSILLECTOMY  Current Outpatient Prescriptions  Medication Sig Dispense Refill  . AZOR 10-40 MG per tablet Take 1 tablet by mouth daily. Reported on 12/04/2015    . Calcium Citrate (CITRACAL PO) Take 1 tablet by mouth every evening.     . Cholecalciferol (VITAMIN D PO) Take 1 tablet by mouth every evening.     Marland Kitchen esomeprazole (NEXIUM) 40 MG capsule Take 40 mg by mouth daily at 12 noon.    Marland Kitchen HYDROcodone-acetaminophen  (NORCO) 10-325 MG tablet Take 1 tablet by mouth every 6 (six) hours as needed for moderate pain or severe pain.     . hydrOXYzine (ATARAX/VISTARIL) 25 MG tablet Take 50-100 mg by mouth every 4 (four) hours as needed for itching.    Marland Kitchen ibuprofen (ADVIL,MOTRIN) 200 MG tablet Take 400 mg by mouth every 6 (six) hours as needed for headache or moderate pain.    Marland Kitchen LORazepam (ATIVAN) 1 MG tablet Take 0.5 mg by mouth at bedtime.    Marland Kitchen ofloxacin (OCUFLOX) 0.3 % ophthalmic solution Place 2 drops into the right ear daily as needed (for pain). 2 drops in right ear daily prn.    Marland Kitchen PARoxetine Mesylate (BRISDELLE) 7.5 MG CAPS Take 1 daily at bedtime 30 capsule 3  . umeclidinium-vilanterol (ANORO ELLIPTA) 62.5-25 MCG/INH AEPB Inhale 1 puff into the lungs daily.    Marland Kitchen venlafaxine XR (EFFEXOR-XR) 75 MG 24 hr capsule Take 75 mg by mouth 2 (two) times daily.     No current facility-administered medications for this visit.     Allergies as of 07/17/2016 - Review Complete 07/17/2016  Allergen Reaction Noted  . Adhesive [tape] Other (See Comments) 07/11/2014  . Doxycycline Nausea And Vomiting 03/04/2016  . Latex Itching and Rash 07/11/2014    Family History  Problem Relation Age of Onset  . Heart disease Mother   . Cancer Father     liver  . Cancer Son   . Heart disease Son   . Cancer Maternal Grandmother   . Colon cancer Neg Hx     Social History   Social History  . Marital status: Widowed    Spouse name: N/A  . Number of children: N/A  . Years of education: N/A   Social History Main Topics  . Smoking status: Former Smoker    Packs/day: 1.00    Years: 50.00    Types: Cigarettes  . Smokeless tobacco: Never Used  . Alcohol use No  . Drug use: No  . Sexual activity: No     Comment: hyst   Other Topics Concern  . None   Social History Narrative  . None    Review of Systems: General: Negative for anorexia, weight loss, fever, chills, fatigue, weakness. Eyes: Negative for vision changes.    ENT: Negative for hoarseness, difficulty swallowing , nasal congestion. CV: Negative for chest pain, angina, palpitations, dyspnea on exertion, peripheral edema.  Respiratory: Negative for dyspnea at rest, dyspnea on exertion, cough, sputum, wheezing.  GI: See history of present illness. GU:  Negative for dysuria, hematuria, urinary incontinence, urinary frequency, nocturnal urination.  MS: Negative for joint pain, low back pain.  Derm: Negative for rash or itching.  Neuro: Negative for weakness, abnormal sensation, seizure, frequent headaches, memory loss, confusion.  Psych: Negative for anxiety, depression, suicidal ideation, hallucinations.  Endo: Negative for unusual weight change.  Heme: Negative for bruising or bleeding. Allergy: Negative for rash or hives.   Physical Exam: BP (!) 159/89   Pulse 99   Temp 97.6 F (  36.4 C) (Oral)   Ht 5\' 1"  (1.549 m)   Wt 151 lb 9.6 oz (68.8 kg)   BMI 28.64 kg/m  General:   Alert and oriented. Pleasant and cooperative. Well-nourished and well-developed.  Head:  Normocephalic and atraumatic. Eyes:  Without icterus, sclera clear and conjunctiva pink.  Ears:  Normal auditory acuity. Mouth:  No deformity or lesions, oral mucosa pink.  Throat/Neck:  Supple, without mass or thyromegaly. Cardiovascular:  S1, S2 present without murmurs appreciated. Normal pulses noted. Extremities without clubbing or edema. Respiratory:  Clear to auscultation bilaterally. No wheezes, rales, or rhonchi. No distress.  Gastrointestinal:  +BS, soft, non-tender and non-distended. No HSM noted. No guarding or rebound. No masses appreciated.  Rectal:  Deferred  Musculoskalatal:  Symmetrical without gross deformities. Normal posture. Skin:  Intact without significant lesions or rashes. Neurologic:  Alert and oriented x4;  grossly normal neurologically. Psych:  Alert and cooperative. Normal mood and affect. Heme/Lymph/Immune: No significant cervical adenopathy. No  excessive bruising noted.    07/17/2016 3:30 PM   Disclaimer: This note was dictated with voice recognition software. Similar sounding words can inadvertently be transcribed and may not be corrected upon review.

## 2016-07-17 NOTE — Assessment & Plan Note (Signed)
History of idiopathic pancreatitis. EUS with FNA biopsy on file. Follow-up MRI pancreatic protocol and follow-up CT both on file with resolution. The patient has not had any signs or symptoms of recurrent pancreatitis. Continue to monitor. Return for follow-up in 6 weeks.

## 2016-07-17 NOTE — Assessment & Plan Note (Signed)
The patient is a very difficult historian and likes to talk extensively about all of her medical problems and stories from her past. It is difficult to discern GI symptoms out of her very elaborate stories. However, as best I can tell, she has constipation manifested as hard stools and incomplete emptying necessitating return to the bathroom shortly after a bowel movement. At this point she denies diarrhea on questioning. I will have her add Benefiber one to 2 times a day or other fiber supplement. Return for follow-up in 6 weeks.

## 2016-07-17 NOTE — Patient Instructions (Signed)
1. Take a fiber supplement 1-2 times a day. There are several options including Benefiber, Metamucil, and others. If he would like assistance, you can talk to the pharmacist to help you take the best option for you. 2. Return for follow-up in 6 weeks and we can check on how you're doing. 3. Call with any worsening or severe symptoms.

## 2016-07-18 NOTE — Progress Notes (Signed)
CC'D TO PCP °

## 2016-07-22 ENCOUNTER — Ambulatory Visit (INDEPENDENT_AMBULATORY_CARE_PROVIDER_SITE_OTHER): Payer: Medicare Other | Admitting: Otolaryngology

## 2016-07-22 DIAGNOSIS — K219 Gastro-esophageal reflux disease without esophagitis: Secondary | ICD-10-CM

## 2016-07-22 DIAGNOSIS — H61112 Acquired deformity of pinna, left ear: Secondary | ICD-10-CM | POA: Diagnosis not present

## 2016-07-22 DIAGNOSIS — R490 Dysphonia: Secondary | ICD-10-CM

## 2016-07-31 ENCOUNTER — Ambulatory Visit: Payer: Medicare Other | Admitting: Gastroenterology

## 2016-08-19 ENCOUNTER — Encounter (HOSPITAL_COMMUNITY)
Admission: RE | Admit: 2016-08-19 | Discharge: 2016-08-19 | Disposition: A | Discharge status: Home / Self Care | Payer: Medicare Other | Source: Ambulatory Visit | Attending: Internal Medicine | Admitting: Internal Medicine

## 2016-08-19 ENCOUNTER — Encounter (HOSPITAL_COMMUNITY): Payer: Medicare Other

## 2016-08-27 ENCOUNTER — Other Ambulatory Visit (HOSPITAL_COMMUNITY): Payer: Self-pay | Admitting: Adult Health Nurse Practitioner

## 2016-08-27 ENCOUNTER — Encounter (HOSPITAL_COMMUNITY): Admission: RE | Admit: 2016-08-27 | Payer: Medicare Other | Source: Ambulatory Visit

## 2016-08-27 ENCOUNTER — Ambulatory Visit (HOSPITAL_COMMUNITY)
Admission: RE | Admit: 2016-08-27 | Discharge: 2016-08-27 | Disposition: A | Discharge status: Home / Self Care | Payer: Medicare Other | Source: Ambulatory Visit | Attending: Adult Health Nurse Practitioner | Admitting: Adult Health Nurse Practitioner

## 2016-08-27 ENCOUNTER — Encounter (HOSPITAL_COMMUNITY)
Admission: RE | Admit: 2016-08-27 | Discharge: 2016-08-27 | Disposition: A | Discharge status: Home / Self Care | Payer: Medicare Other | Source: Ambulatory Visit | Attending: Internal Medicine | Admitting: Internal Medicine

## 2016-08-27 ENCOUNTER — Encounter (HOSPITAL_COMMUNITY): Payer: Self-pay

## 2016-08-27 DIAGNOSIS — J984 Other disorders of lung: Secondary | ICD-10-CM

## 2016-08-27 DIAGNOSIS — M810 Age-related osteoporosis without current pathological fracture: Secondary | ICD-10-CM | POA: Insufficient documentation

## 2016-08-27 DIAGNOSIS — J449 Chronic obstructive pulmonary disease, unspecified: Secondary | ICD-10-CM | POA: Insufficient documentation

## 2016-08-27 MED ORDER — DENOSUMAB 60 MG/ML ~~LOC~~ SOLN
60.0000 mg | Freq: Once | SUBCUTANEOUS | Status: AC
  Administered 2016-08-27: 60 mg via SUBCUTANEOUS
  Filled 2016-08-27: qty 1

## 2016-08-28 ENCOUNTER — Ambulatory Visit: Payer: Medicare Other | Admitting: Adult Health

## 2016-08-28 ENCOUNTER — Encounter: Payer: Self-pay | Admitting: *Deleted

## 2016-08-29 ENCOUNTER — Ambulatory Visit: Payer: Medicare Other | Admitting: Nurse Practitioner

## 2016-09-02 ENCOUNTER — Ambulatory Visit (INDEPENDENT_AMBULATORY_CARE_PROVIDER_SITE_OTHER): Payer: Medicare Other | Admitting: Otolaryngology

## 2016-09-04 ENCOUNTER — Ambulatory Visit: Payer: Medicare Other | Admitting: Nurse Practitioner

## 2016-09-12 ENCOUNTER — Telehealth: Payer: Self-pay | Admitting: Adult Health

## 2016-09-12 NOTE — Telephone Encounter (Signed)
Patient called in stating that she would like to speak with Anderson Malta, Pt did not state the reason for her call. Please contact pt

## 2016-09-12 NOTE — Telephone Encounter (Signed)
Spoke with pt. Pt started having night sweats in September. She had a mini stroke in the past. Dr. Olivia Canter office prescribed Estradiol cream to use vaginally. Pt used it one time and not any more due to history of mini stoke in the past and she's not supposed to use estrogen. Pt had itching afterwards but that subsided and she had a hot flash that went away afterwards. I spoke with JAG and she advised she wouldn't chance it and vaginal estrogen won't help hot flashes. Pt voiced understanding and won't use it anymore. [place]

## 2016-09-24 ENCOUNTER — Ambulatory Visit: Payer: Medicare Other | Admitting: Cardiovascular Disease

## 2016-09-24 NOTE — Progress Notes (Signed)
Cardiology Office Note   Date:  09/26/2016   ID:  Connie Wood, DOB 1941/06/21, MRN [id number]  PCP:  Wende Neighbors, MD  Cardiologist:   Jenkins Rouge, MD   No chief complaint on file.     History of Present Illness: Connie Wood is a 76 y.o. female who presents for evaluation/consultation of edema and vascular disease.Marland Kitchen Referred by Dr Nevada Crane His note from 08/26/16 indicated ischemic DCM and CAD with need for carotid duplex and echo. Last seen by Dr Harl Bowie for dyspnea ? CHF. Historyh of HTN, elevated lipids, CVA and rheumatic fever as child. She has COPD and continues to smoke up until this summer.     Last echo personally reviewed: 07/12/13 EF 65-70% mild LVH grade one diastolic No valve disease estimated PA 36 mmHg  Carotid 2013 less than 50% plaque bilaterally   She is a very rambling historian. Seems to have some general concern for her overall health. Keeps talking about some vascular screening studies She had in 2016 and she is worried about her arteries. She has had LE edema She complains of fatigue Thinks she needs to see a kidney doctor for frequency   Past Medical History:  Diagnosis Date  . Back pain   . Cat scratch of right forearm 02/03/2014  . Hard of hearing   . Hemorrhoid 04/06/2014  . Hx of degenerative disc disease   . Hypercholesteremia 02/03/2014  . Hypercholesterolemia   . Hypertension   . Kidney stones   . Migraines   . Osteoarthritis   . Stroke [facility])    mini stroke, resolved in 30 minutes    Past Surgical History:  Procedure Laterality Date  . ABDOMINAL HYSTERECTOMY    . ANTERIOR AND POSTERIOR REPAIR    . APPENDECTOMY    . BILATERAL SALPINGOOPHORECTOMY    . BREAST SURGERY    . CHOLECYSTECTOMY    . colonoscopy  2003   Dr. Gala Romney: normal rectum, scattered diverticula  . COLONOSCOPY WITH PROPOFOL N/A 01/01/2016   Procedure: COLONOSCOPY WITH PROPOFOL;  Surgeon: Daneil Dolin, MD;  [facility];  Service: Endoscopy;   Laterality: N/A;  1015  . ERCP with sphincterotomy  2003   Dr. Gala Romney: balloon dredging of bile duct, normal ampulla, choledocholithiasis   . EUS  2016   Dr. Amalia Hailey: subtle 13X17 mm heterogenous lesion in pancreas neck, FNA negative for malignancy   . POLYPECTOMY  01/01/2016   Procedure: POLYPECTOMY;  Surgeon: Daneil Dolin, MD;  [facility];  Service: Endoscopy;;   polypectomies x2-splenic flexure and cecal  . TONSILLECTOMY       Current Outpatient Prescriptions  Medication Sig Dispense Refill  . AZOR 10-40 MG per tablet Take 1 tablet by mouth daily. Reported on 12/04/2015    . Calcium Citrate (CITRACAL PO) Take 1 tablet by mouth every evening.     . Cholecalciferol (VITAMIN D PO) Take 1 tablet by mouth every evening.     Marland Kitchen HYDROcodone-acetaminophen (NORCO) 10-325 MG tablet Take 1 tablet by mouth every 6 (six) hours as needed for moderate pain or severe pain.     . hydrOXYzine (ATARAX/VISTARIL) 25 MG tablet Take 50-100 mg by mouth every 4 (four) hours as needed for itching.    Marland Kitchen ibuprofen (ADVIL,MOTRIN) 200 MG tablet Take 400 mg by mouth every 6 (six) hours as needed for headache or moderate pain.    Marland Kitchen ofloxacin (OCUFLOX) 0.3 % ophthalmic solution Place 2 drops into the right ear  daily as needed (for pain). 2 drops in right ear daily prn.    Marland Kitchen PARoxetine Mesylate (BRISDELLE) 7.5 MG CAPS Take 1 daily at bedtime 30 capsule 3  . umeclidinium-vilanterol (ANORO ELLIPTA) 62.5-25 MCG/INH AEPB Inhale 1 puff into the lungs daily.    Marland Kitchen venlafaxine XR (EFFEXOR-XR) 75 MG 24 hr capsule Take 75 mg by mouth 2 (two) times daily.    Marland Kitchen esomeprazole (NEXIUM) 40 MG capsule Take 40 mg by mouth daily at 12 noon.    Marland Kitchen LORazepam (ATIVAN) 1 MG tablet Take 0.5 mg by mouth at bedtime.     No current facility-administered medications for this visit.     Allergies:   Adhesive [tape]; Doxycycline; and Latex    Social History:  The patient  reports that she has quit smoking. Her smoking use included  Cigarettes. She has a 50.00 pack-year smoking history. She has never used smokeless tobacco. She reports that she does not drink alcohol or use drugs.   Family History:  The patient's family history includes Cancer in her father, maternal grandmother, and son; Heart disease in her mother and son.    ROS:  Please see the history of present illness.   Otherwise, review of systems are positive for none.   All other systems are reviewed and negative.    PHYSICAL EXAM: VS:  BP 140/74 (BP Location: Right Arm)   Pulse 95   Ht 5\' 1"  (1.549 m)   Wt 153 lb (69.4 kg)   SpO2 96%   BMI 28.91 kg/m  , BMI Body mass index is 28.91 kg/m. Affect appropriate Healthy:  appears stated age 49: normal Neck supple with no adenopathy JVP normal no bruits no thyromegaly Lungs clear with no wheezing and good diaphragmatic motion Heart:  S1/S2 no murmur, no rub, gallop or click PMI normal Abdomen: benighn, BS positve, no tenderness, no AAA no bruit.  No HSM or HJR Distal pulses intact with no bruits Trace LE  edema Neuro non-focal Skin warm and dry No muscular weakness    EKG:   SR voltage for LVH 03/04/16    Recent Labs: 02/26/2016: ALT 15 03/04/2016: B Natriuretic Peptide 55.0; BUN 23; Creatinine, Ser 0.79; Hemoglobin 13.0; Platelets 261; Potassium 3.8; Sodium 139    Lipid Panel    Component Value Date/Time   CHOL 211 (H) 02/03/2014 1459   TRIG 123 02/03/2014 1459   HDL 71 02/03/2014 1459   CHOLHDL 3.0 02/03/2014 1459   VLDL 25 02/03/2014 1459   LDLCALC 115 (H) 02/03/2014 1459      Wt Readings from Last 3 Encounters:  09/26/16 153 lb (69.4 kg)  07/17/16 151 lb 9.6 oz (68.8 kg)  05/30/16 143 lb 8 oz (65.1 kg)      Other studies Reviewed: Additional studies/ records that were reviewed today include: Notes Dr Nevada Crane and Dr Harl Bowie previous echo and carotid duplex .    ASSESSMENT AND PLAN:  1.HTN Well controlled.  Continue current medications and low sodium Dash type diet.   2.  Cholesterol diet RX LDL 115  3. PVD/Carotid she is concerned about plaque on previous screening studies and has had carotid plaque and atherosclerosis on CXR Will order f/u carotid duplex and AAA screening US abdomen 4. Dyspnea COPD and smoking normal exam f/u echo for RV/LV function estimate PA pressures 5. Edema dependant venous support hose and PRN diuretic 6. Smoking quit this summer f/u yearly CXR exam benign today    Current medicines are reviewed at length with  the patient today.  The patient does not have concerns regarding medicines.  The following changes have been made:  no change  Labs/ tests ordered today include: Echo , carotid and Abdominal US  No orders of the defined types were placed in this encounter.    Disposition:   FU with Dr Harl Bowie in a year      Signed, Jenkins Rouge, MD  09/26/2016 3:07 PM    [facility] [place], [place], [place]  [address] Phone: [phone]; Fax: [phone]

## 2016-09-26 ENCOUNTER — Ambulatory Visit (INDEPENDENT_AMBULATORY_CARE_PROVIDER_SITE_OTHER): Payer: Medicare Other | Admitting: Cardiovascular Disease

## 2016-09-26 ENCOUNTER — Encounter: Payer: Self-pay | Admitting: Cardiovascular Disease

## 2016-09-26 VITALS — BP 140/74 | HR 95 | Ht 61.0 in | Wt 153.0 lb

## 2016-09-26 DIAGNOSIS — Z136 Encounter for screening for cardiovascular disorders: Secondary | ICD-10-CM | POA: Diagnosis not present

## 2016-09-26 DIAGNOSIS — I6523 Occlusion and stenosis of bilateral carotid arteries: Secondary | ICD-10-CM | POA: Diagnosis not present

## 2016-09-26 DIAGNOSIS — R600 Localized edema: Secondary | ICD-10-CM | POA: Diagnosis not present

## 2016-09-26 NOTE — Addendum Note (Signed)
Addended by: Barbarann Ehlers A on: 09/26/2016 03:35 PM   Modules accepted: Orders

## 2016-09-26 NOTE — Patient Instructions (Signed)
Your physician wants you to follow-up in: 1 year with Dr Bryna Colander will receive a reminder letter in the mail two months in advance. If you don't receive a letter, please call our office to schedule the follow-up appointment.   Your physician has requested that you have an echocardiogram. Echocardiography is a painless test that uses sound waves to create images of your heart. It provides your doctor with information about the size and shape of your heart and how well your heart's chambers and valves are working. This procedure takes approximately one hour. There are no restrictions for this procedure.    Your physician has requested that you have a carotid duplex. This test is an ultrasound of the carotid arteries in your neck. It looks at blood flow through these arteries that supply the brain with blood. Allow one hour for this exam. There are no restrictions or special instructions.    Your physician has requested that you have an abdominal aorta duplex. During this test, an ultrasound is used to evaluate the aorta. Allow 30 minutes for this exam. Do not eat after midnight the day before and avoid carbonated beverages      Thank you for choosing [facility] !

## 2016-10-01 ENCOUNTER — Other Ambulatory Visit (HOSPITAL_COMMUNITY): Payer: Medicare Other

## 2016-10-02 ENCOUNTER — Encounter: Payer: Self-pay | Admitting: Nurse Practitioner

## 2016-10-02 ENCOUNTER — Ambulatory Visit (INDEPENDENT_AMBULATORY_CARE_PROVIDER_SITE_OTHER): Payer: Medicare Other | Admitting: Nurse Practitioner

## 2016-10-02 ENCOUNTER — Encounter: Payer: Self-pay | Admitting: Internal Medicine

## 2016-10-02 VITALS — BP 135/76 | HR 103 | Temp 97.6°F | Ht 61.0 in | Wt 155.4 lb

## 2016-10-02 DIAGNOSIS — K5900 Constipation, unspecified: Secondary | ICD-10-CM | POA: Diagnosis not present

## 2016-10-02 NOTE — Patient Instructions (Signed)
1. Take Miralax once a day 2. Continue fiber supplement every day 3. Call with the other medicine you have used previously with good results, in case the Miralax doesn't help 4. Return for follow-up in 3 months. 5. Call if any worsening symptoms before then.

## 2016-10-02 NOTE — Progress Notes (Signed)
Referring Provider: Celene Squibb, MD Primary Care Physician:  Wende Neighbors, MD Primary GI:  Dr. Gala Romney  Chief Complaint  Patient presents with  . Abdominal Pain    "cramping"    HPI:   Connie Wood is a 76 y.o. female who presents for follow-up on constipation. The patient was last seen in our office 07/17/2016 for history of acute pancreatitis and constipation. Colonoscopy up-to-date completed 01/01/2016 with recommended 3 year repeat, next due in 2020. At the time of her last visit she stated she was doing okay. Described lots of other non-GI problems including back pain, DJD, weight, sore throat, cough, etc. Noted to be a difficult historian. Denies further acute pancreatitis symptoms. Has "weird bowels" while a bowel movement and have to return soon thereafter but states stools "are fine" and agree that they're formed but may be constipated with some tiny balls of stool. Recommended she take a fiber supplement 1-2 times a day, return for follow-up in 6 weeks.  Today she states she's again having a lot of problems. Still having significant constipation. Not typical constipation, but has sensation of incomplete emptying and need to return to the bathroom. Stiols consistent with Bristol 1. No "really good" bowel movement in the past few weeks. Later in the conversation she now states she doesn't have incomplete emptying and then "but after I go it feels like it's still there." Very difficult to discern her symptoms. Has been taking fiber supplement, can't tell much difference. Denies hematochezia, melena. Denies any other upper or lower GI symptoms.  Past Medical History:  Diagnosis Date  . Back pain   . Cat scratch of right forearm 02/03/2014  . Hard of hearing   . Hemorrhoid 04/06/2014  . Hx of degenerative disc disease   . Hypercholesteremia 02/03/2014  . Hypercholesterolemia   . Hypertension   . Kidney stones   . Migraines   . Osteoarthritis   . Stroke [facility]    mini stroke,  resolved in 30 minutes    Past Surgical History:  Procedure Laterality Date  . ABDOMINAL HYSTERECTOMY    . ANTERIOR AND POSTERIOR REPAIR    . APPENDECTOMY    . BILATERAL SALPINGOOPHORECTOMY    . BREAST SURGERY    . CHOLECYSTECTOMY    . colonoscopy  2003   Dr. Gala Romney: normal rectum, scattered diverticula  . COLONOSCOPY WITH PROPOFOL N/A 01/01/2016   Procedure: COLONOSCOPY WITH PROPOFOL;  Surgeon: Daneil Dolin, MD;  [facility];  Service: Endoscopy;  Laterality: N/A;  1015  . ERCP with sphincterotomy  2003   Dr. Gala Romney: balloon dredging of bile duct, normal ampulla, choledocholithiasis   . EUS  2016   Dr. Amalia Hailey: subtle 13X17 mm heterogenous lesion in pancreas neck, FNA negative for malignancy   . POLYPECTOMY  01/01/2016   Procedure: POLYPECTOMY;  Surgeon: Daneil Dolin, MD;  [facility];  Service: Endoscopy;;   polypectomies x2-splenic flexure and cecal  . TONSILLECTOMY      Current Outpatient Prescriptions  Medication Sig Dispense Refill  . acetaminophen (TYLENOL) 500 MG tablet Take 1,000 mg by mouth every 6 (six) hours as needed.    Marland Kitchen aspirin 81 MG chewable tablet Chew 81 mg by mouth as needed.    . AZOR 10-40 MG per tablet Take 1 tablet by mouth daily. Reported on 12/04/2015    . Calcium Citrate (CITRACAL PO) Take 1 tablet by mouth every evening.     . Cholecalciferol (VITAMIN D PO)  Take 1 tablet by mouth every evening.     Marland Kitchen HYDROcodone-acetaminophen (NORCO) 10-325 MG tablet Take 1 tablet by mouth every 6 (six) hours as needed for moderate pain or severe pain.     . hydrOXYzine (ATARAX/VISTARIL) 25 MG tablet Take 50-100 mg by mouth every 4 (four) hours as needed for itching.    Marland Kitchen ibuprofen (ADVIL,MOTRIN) 200 MG tablet Take 400 mg by mouth every 6 (six) hours as needed for headache or moderate pain.    Marland Kitchen LORazepam (ATIVAN) 1 MG tablet Take 0.5 mg by mouth at bedtime.    . Melatonin 5 MG TABS Take 1 tablet by mouth at bedtime.    Marland Kitchen ofloxacin (OCUFLOX) 0.3 %  ophthalmic solution Place 2 drops into the right ear daily as needed (for pain). 2 drops in right ear daily prn.    . omeprazole (PRILOSEC) 20 MG capsule Take 20 mg by mouth daily.    Marland Kitchen PARoxetine Mesylate (BRISDELLE) 7.5 MG CAPS Take 1 daily at bedtime 30 capsule 3  . psyllium (REGULOID) 0.52 g capsule Take 0.52 g by mouth daily. Takes 2 daily    . umeclidinium-vilanterol (ANORO ELLIPTA) 62.5-25 MCG/INH AEPB Inhale 1 puff into the lungs daily.    Marland Kitchen venlafaxine XR (EFFEXOR-XR) 75 MG 24 hr capsule Take 75 mg by mouth 2 (two) times daily.     No current facility-administered medications for this visit.     Allergies as of 10/02/2016 - Review Complete 10/02/2016  Allergen Reaction Noted  . Adhesive [tape] Other (See Comments) 07/11/2014  . Doxycycline Nausea And Vomiting 03/04/2016  . Latex Itching and Rash 07/11/2014    Family History  Problem Relation Age of Onset  . Heart disease Mother   . Cancer Father     liver  . Cancer Son   . Heart disease Son   . Cancer Maternal Grandmother   . Colon cancer Neg Hx     Social History   Social History  . Marital status: Widowed    Spouse name: N/A  . Number of children: N/A  . Years of education: N/A   Social History Main Topics  . Smoking status: Former Smoker    Packs/day: 1.00    Years: 50.00    Types: Cigarettes  . Smokeless tobacco: Never Used  . Alcohol use No  . Drug use: No  . Sexual activity: No     Comment: hyst   Other Topics Concern  . None   Social History Narrative  . None    Review of Systems: General: Negative for anorexia, weight loss, fever, chills, fatigue, weakness. ENT: Negative for hoarseness, difficulty swallowing. CV: Negative for chest pain, angina, palpitations, peripheral edema.  Respiratory: Negative for dyspnea at rest, cough, sputum, wheezing.  GI: See history of present illness. Endo: Negative for unusual weight change.  Heme: Negative for bruising or bleeding.   Physical Exam: BP  135/76   Pulse (!) 103   Temp 97.6 F (36.4 C) (Oral)   Ht 5\' 1"  (1.549 m)   Wt 155 lb 6.4 oz (70.5 kg)   BMI 29.36 kg/m  General:   Alert and oriented. Pleasant and cooperative. Well-nourished and well-developed.  Eyes:  Without icterus, sclera clear and conjunctiva pink.  Ears:  Normal auditory acuity. Cardiovascular:  S1, S2 present without murmurs appreciated. Extremities without clubbing or edema. Respiratory:  Clear to auscultation bilaterally. No wheezes, rales, or rhonchi. No distress.  Gastrointestinal:  +BS, soft, and non-distended. Mild lower abdominal TTP noted.  No HSM noted. No guarding or rebound. No masses appreciated.  Rectal:  Deferred  Musculoskalatal:  Symmetrical without gross deformities. Normal posture. Neurologic:  Alert and oriented x4;  grossly normal neurologically. Psych:  Alert and cooperative. Normal mood and affect. Heme/Lymph/Immune: No excessive bruising noted.    10/02/2016 1:43 PM   Disclaimer: This note was dictated with voice recognition software. Similar sounding words can inadvertently be transcribed and may not be corrected upon review.

## 2016-10-02 NOTE — Assessment & Plan Note (Signed)
Very difficult historian, difficult to discern her symptoms. She seems to be having continued constipation despite fiber supplement which she is taking daily. At this point I'll have her start MiraLAX once a day. Continue fiber supplement. Return for follow-up in 3 months.

## 2016-10-03 NOTE — Progress Notes (Signed)
CC'D TO PCP °

## 2016-10-07 ENCOUNTER — Ambulatory Visit (HOSPITAL_COMMUNITY)
Admission: RE | Admit: 2016-10-07 | Discharge: 2016-10-07 | Disposition: A | Discharge status: Home / Self Care | Payer: Medicare Other | Source: Ambulatory Visit | Attending: Cardiovascular Disease | Admitting: Cardiovascular Disease

## 2016-10-07 DIAGNOSIS — R600 Localized edema: Secondary | ICD-10-CM

## 2016-10-07 DIAGNOSIS — Z136 Encounter for screening for cardiovascular disorders: Secondary | ICD-10-CM

## 2016-10-07 DIAGNOSIS — I700 Atherosclerosis of aorta: Secondary | ICD-10-CM | POA: Insufficient documentation

## 2016-10-07 DIAGNOSIS — I6523 Occlusion and stenosis of bilateral carotid arteries: Secondary | ICD-10-CM | POA: Diagnosis present

## 2016-10-07 DIAGNOSIS — I5189 Other ill-defined heart diseases: Secondary | ICD-10-CM | POA: Insufficient documentation

## 2016-10-07 DIAGNOSIS — I77811 Abdominal aortic ectasia: Secondary | ICD-10-CM | POA: Diagnosis not present

## 2016-10-07 LAB — ECHOCARDIOGRAM COMPLETE
AOPV: 0.71 m/s
AOVTI: 40.1 cm
AV Area VTI: 1.62 cm2
AV VEL mean LVOT/AV: 0.7
AV pk vel: 185 cm/s
AV vel: 1.57
AVAREAMEANV: 1.59 cm2
AVAREAMEANVIN: 0.9 cm2/m2
AVAREAVTIIND: 0.89 cm2/m2
AVG: 8 mmHg
AVPG: 14 mmHg
CHL CUP AV PEAK INDEX: 0.92
CHL CUP AV VALUE AREA INDEX: 0.89
CHL CUP DOP CALC LVOT VTI: 27.7 cm
CHL CUP RV SYS PRESS: 34 mmHg
CHL CUP TV REG PEAK VELOCITY: 279 cm/s
DOP CAL AO MEAN VELOCITY: 127 cm/s
E/e' ratio: 13.6
EWDT: 268 ms
FS: 32 % (ref 28–44)
IV/PV OW: 1.38
LA ID, A-P, ES: 23 mm
LA diam end sys: 23 mm
LA vol A4C: 27.3 ml
LA vol: 29.4 mL
LADIAMINDEX: 1.3 cm/m2
LAVOLIN: 16.6 mL/m2
LV PW d: 11.1 mm — AB (ref 0.6–1.1)
LV TDI E'MEDIAL: 7.72
LV dias vol: 59 mL (ref 46–106)
LVDIAVOLIN: 33 mL/m2
LVEEAVG: 13.6
LVEEMED: 13.6
LVELAT: 8.38 cm/s
LVOT SV: 63 mL
LVOT area: 2.27 cm2
LVOT peak grad rest: 7 mmHg
LVOT peak vel: 132 cm/s
LVOTD: 17 mm
LVOTVTI: 0.69 cm
Lateral S' vel: 13.4 cm/s
MV Dec: 268
MV Peak grad: 5 mmHg
MVPKAVEL: 137 m/s
MVPKEVEL: 114 m/s
RV TAPSE: 20 mm
TDI e' lateral: 8.38
TR max vel: 279 cm/s
VTI: 189 cm
Valve area: 1.57 cm2

## 2016-10-07 NOTE — Progress Notes (Signed)
*  PRELIMINARY RESULTS* Echocardiogram 2D Echocardiogram has been performed.  Samuel Germany 10/07/2016, 2:17 PM

## 2016-10-14 ENCOUNTER — Telehealth: Payer: Self-pay | Admitting: Internal Medicine

## 2016-10-14 NOTE — Telephone Encounter (Signed)
Pt said that EG told her to call us with the name of her laxative her gyno put her on. She said the label read 450 lactulose 10 mg-68ml sol ml

## 2016-10-14 NOTE — Telephone Encounter (Signed)
Routing to EG 

## 2016-10-15 NOTE — Telephone Encounter (Signed)
Noted  

## 2016-10-15 NOTE — Progress Notes (Signed)
Patient called and noted she is also on lactulose 10-15 mL for constipation.

## 2016-10-28 ENCOUNTER — Encounter: Payer: Self-pay | Admitting: Nurse Practitioner

## 2016-11-06 ENCOUNTER — Telehealth: Payer: Self-pay | Admitting: *Deleted

## 2016-11-06 NOTE — Telephone Encounter (Signed)
Seeing Dr Nevada Crane, now wants appt to see me and thinks she needs urology referral, number given for [facility]. Appt made for 6/5 at 1:30 pm and extra time needed. Swelling in feet and legs, had kidney problems as a child.

## 2016-11-12 ENCOUNTER — Other Ambulatory Visit: Payer: Self-pay | Admitting: Neurological Surgery

## 2016-11-12 DIAGNOSIS — M542 Cervicalgia: Secondary | ICD-10-CM

## 2016-11-19 ENCOUNTER — Ambulatory Visit
Admission: RE | Admit: 2016-11-19 | Discharge: 2016-11-19 | Disposition: A | Discharge status: Home / Self Care | Payer: Medicare Other | Source: Ambulatory Visit | Attending: Neurological Surgery | Admitting: Neurological Surgery

## 2016-11-19 DIAGNOSIS — M542 Cervicalgia: Secondary | ICD-10-CM

## 2016-11-26 ENCOUNTER — Ambulatory Visit: Payer: Medicare Other | Admitting: Adult Health

## 2017-01-06 ENCOUNTER — Encounter: Payer: Self-pay | Admitting: Gastroenterology

## 2017-01-06 ENCOUNTER — Ambulatory Visit (INDEPENDENT_AMBULATORY_CARE_PROVIDER_SITE_OTHER): Payer: Medicare Other | Admitting: Gastroenterology

## 2017-01-06 ENCOUNTER — Ambulatory Visit: Payer: Medicare Other | Admitting: Nurse Practitioner

## 2017-01-06 DIAGNOSIS — K581 Irritable bowel syndrome with constipation: Secondary | ICD-10-CM | POA: Diagnosis not present

## 2017-01-06 DIAGNOSIS — K589 Irritable bowel syndrome without diarrhea: Secondary | ICD-10-CM | POA: Insufficient documentation

## 2017-01-06 NOTE — Progress Notes (Signed)
Primary Care Physician: Celene Squibb, MD  Primary Gastroenterologist:  Garfield Cornea, MD   Chief Complaint  Patient presents with  . incontinent    at times  . black stool    HPI: Connie Wood is a 76 y.o. female here for follow up of bowel issues. She was last seen in 09/2016. She gives h/o chronic IBS but denies being bothered by symptoms for years until after her TCS last fall. States she has frequent small BMs, mostly Bristol 1, With filling of incomplete evacuation. Constantly wears a pad to protect her underwear. Several episodes of fecal incontinence when she's been away from home. Episodes of incontinence have been associated with fecal urgency and soft stool. She's got to the point she is scared to go out. She usually avoids eating and drinking prior to leaving her house. Usually after the first [facility] on stool she has to go back several times. Stools are usually more productive but still incomplete. She also complains of a lot of gas. Gas-X not help. Very embarrassed by it. Next  She continues to take fiber capsule 2 daily which is the equivalent of about 1 g of fiber. She tried MiraLAX after last visit but states it was a Therapist, art. She states she was having to go back-and-forth to the bathroom even more frequently and had more issues with incontinence. At this point she only uses that if she feels constipated. In the past she was prescribed lactulose by her GYN and stated that that seemed to work very well when she took it at bedtime. She will generally have a really good bowel movement in the morning. She also takes occasional Pepto-Bismol for gas pain. She noted a black stool recently, first time ever. Subsequent stools have been fine. Takes Pepto-Bismol couple times per week. Takes antidiarrheal on occasion if she really needs to go somewhere and she is concerned about having a BM.  She does have some crampy abdominal pain preceding BM, followed by resolution of pain.  Similar to IBS symptoms in the past but less severe. We discussed possibility of hydrocodone aggravating her bowel function patient is adamant that she's been on for years without having any problems. She is not interested in trying Movantik or similar agents at this time.   Patient is also having urinary incontinence issues and is scheduled to see urologist in the near future.   Prior pertinent GI history: History of idiopathic pancreatitis in 2016. MRCP in March of that year with abnormal soft tissue density/enhancement along the anterior-inferior margin of the pancreatic head. Subsequent [facility] at [facility] in 2016 with heterogeneous lesion in the pancreatic neck, FNA negative for malignancy. 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 3 months later she had recommended pancreatic protocol CT in July 2016 and at that time no evidence of pancreatic mass. She had some fatty replacement of pancreas at site of her prior pancreatitis.  Current Outpatient Prescriptions  Medication Sig Dispense Refill  . acetaminophen (TYLENOL) 500 MG tablet Take 1,000 mg by mouth every 6 (six) hours as needed.    Marland Kitchen aspirin 81 MG chewable tablet Chew 81 mg by mouth as needed.    . AZOR 10-40 MG per tablet Take 1 tablet by mouth daily. Reported on 12/04/2015    . Calcium Citrate (CITRACAL PO) Take 1 tablet by mouth every evening.     . Cholecalciferol (VITAMIN D PO) Take 1 tablet by mouth every evening.     Marland Kitchen  HYDROcodone-acetaminophen (NORCO) 10-325 MG tablet Take 1 tablet by mouth every 6 (six) hours as needed for moderate pain or severe pain.     . hydrOXYzine (ATARAX/VISTARIL) 25 MG tablet Take 50-100 mg by mouth every 4 (four) hours as needed for itching.    Marland Kitchen ibuprofen (ADVIL,MOTRIN) 200 MG tablet Take 400 mg by mouth every 6 (six) hours as needed for headache or moderate pain.    Marland Kitchen ofloxacin (OCUFLOX) 0.3 % ophthalmic solution Place 2 drops into the right ear daily as needed  (for pain). 2 drops in right ear daily prn.    . omeprazole (PRILOSEC) 20 MG capsule Take 20 mg by mouth daily.    . psyllium (REGULOID) 0.52 g capsule Take 0.52 g by mouth daily. Takes 2 daily    . umeclidinium-vilanterol (ANORO ELLIPTA) 62.5-25 MCG/INH AEPB Inhale 1 puff into the lungs as needed.     . venlafaxine XR (EFFEXOR-XR) 75 MG 24 hr capsule Take 75 mg by mouth 2 (two) times daily.     No current facility-administered medications for this visit.     Allergies as of 01/06/2017 - Review Complete 01/06/2017  Allergen Reaction Noted  . Adhesive [tape] Other (See Comments) 07/11/2014  . Doxycycline Nausea And Vomiting 03/04/2016  . Latex Itching and Rash 07/11/2014    ROS:  General: Negative for anorexia, weight loss, fever, chills, fatigue, weakness. ENT: Negative for hoarseness, difficulty swallowing , nasal congestion. CV: Negative for chest pain, angina, palpitations, dyspnea on exertion, peripheral edema.  Respiratory: Negative for dyspnea at rest, dyspnea on exertion, cough, sputum, wheezing.  GI: See history of present illness. GU:  Negative for dysuria, hematuria, urinary incontinence, urinary frequency, nocturnal urination.  Endo: Negative for unusual weight change.    Physical Examination:   BP 132/77   Pulse 86   Temp (!) 96.6 F (35.9 C) (Oral)   Ht 5\' 1"  (1.549 m)   Wt 156 lb 3.2 oz (70.9 kg)   BMI 29.51 kg/m   General: Well-nourished, well-developed in no acute distress.  Eyes: No icterus. Mouth: Oropharyngeal mucosa moist and pink , no lesions erythema or exudate. Lungs: Clear to auscultation bilaterally.  Heart: Regular rate and rhythm, no murmurs rubs or gallops.  Abdomen: Bowel sounds are normal, nontender, nondistended, no hepatosplenomegaly or masses, no abdominal bruits or hernia , no rebound or guarding.   Extremities: No lower extremity edema. No clubbing or deformities. Neuro: Alert and oriented x 4   Skin: Warm and dry, no jaundice.   Psych:  Alert and cooperative, normal mood and affect.

## 2017-01-06 NOTE — Patient Instructions (Addendum)
1. Try to avoid Pepto-Bismol and anti-diarrhea medications in the near future to give [place] some time to try and straighten out your bowel function.  2. Stop fiber capsules. 3. Start a different fiber supplement. I suggest a fiber supplement with at least 3 to 4 grams of fiber per dose. A good choice would be Fiber Choice (chew two daily) or Benefiber chewables or powder. 4. Try a probiotic. We were out of samples today. You can buy over the counter without prescription. [employer] and Align are both good choices. You could also get a probiotic by eating Dannon yogurt at least twice daily. You should take probiotic at least for 3-4 weeks to see full benefit for bowel function and gas regulation.  5. Keep a stool diary. Drop off copy in 2 weeks.  6. If you see further black stool, please let me know. 7. Return to the office to see Dr. Gala Romney in 6-8 weeks.  8. Call in the interim with any questions or concerns.

## 2017-01-07 NOTE — Progress Notes (Signed)
cc'ed to pcp °

## 2017-01-07 NOTE — Assessment & Plan Note (Addendum)
Suspect her bowel concerns are related to IBS symptoms. Would recommend simplifying her bowel regimen. I have asked her to try to avoid Pepto-Bismol and Imodium. She describes more of incomplete evacuation and hard stools rather than diarrhea. She is having frequent small nonproductive stools daily. Occasionally has a large stool.  We'll change her fiber supplement to Benefiber fiber choice at least 3-4 g supplement per day. Add probiotic. Keep a stool journal. She previously did well on lactulose at bedtime but will make one change in regimen at a time. I suspect her recent black stool was related to Pepto-Bismol use. She'll notify us if she sees any further ones. We'll have her come back to see Dr. Gala Romney in 6 weeks for follow-up. She is to call sooner if needed.

## 2017-01-08 ENCOUNTER — Encounter: Payer: Self-pay | Admitting: Internal Medicine

## 2017-01-14 ENCOUNTER — Other Ambulatory Visit (HOSPITAL_COMMUNITY)
Admission: RE | Admit: 2017-01-14 | Discharge: 2017-01-14 | Disposition: A | Discharge status: Home / Self Care | Payer: Medicare Other | Source: Other Acute Inpatient Hospital | Attending: Urology | Admitting: Urology

## 2017-01-14 ENCOUNTER — Ambulatory Visit (INDEPENDENT_AMBULATORY_CARE_PROVIDER_SITE_OTHER): Payer: Medicare Other | Admitting: Urology

## 2017-01-14 DIAGNOSIS — R351 Nocturia: Secondary | ICD-10-CM

## 2017-01-14 DIAGNOSIS — R3915 Urgency of urination: Secondary | ICD-10-CM | POA: Diagnosis not present

## 2017-01-14 DIAGNOSIS — R3121 Asymptomatic microscopic hematuria: Secondary | ICD-10-CM | POA: Insufficient documentation

## 2017-01-14 LAB — URINALYSIS, COMPLETE (UACMP) WITH MICROSCOPIC
Bacteria, UA: NONE SEEN
Bilirubin Urine: NEGATIVE
GLUCOSE, UA: NEGATIVE mg/dL
KETONES UR: NEGATIVE mg/dL
Leukocytes, UA: NEGATIVE
NITRITE: NEGATIVE
PH: 5 (ref 5.0–8.0)
Protein, ur: 30 mg/dL — AB
Specific Gravity, Urine: 1.017 (ref 1.005–1.030)

## 2017-01-15 ENCOUNTER — Telehealth: Payer: Self-pay | Admitting: Internal Medicine

## 2017-01-15 NOTE — Telephone Encounter (Signed)
[place], SHE HAS SOME QUESTIONS ABOUT HER LAST APPOINTMENT

## 2017-01-16 NOTE — Telephone Encounter (Signed)
Tried to call pt x2, first time call would not go through. The second time it was busy

## 2017-01-16 NOTE — Telephone Encounter (Signed)
Pt called back, she said she was keeping her stool diary and has misplaced it over the weekend. She said she was doing pretty good but the weekend was awful. She reports several episodes of diarrhea on Sat and Sun, she had to take a dose of imodium just so she could go to the grocery store. She is not eating much, but had some reflux and took tums. She did get a probiotic and fiber choice and has been taking it, hasnt noticed a difference in her symptoms. She has also noticed some black spots in her stool. I have asked her to start another stool diary and advised her she may want to add what foods she is eating when she has these episodes. Pt said she would. She is aware that LSL is on vacation and will bring by her stool diary next week.

## 2017-01-27 NOTE — Telephone Encounter (Signed)
Await stool diary.

## 2017-02-19 ENCOUNTER — Ambulatory Visit: Payer: Medicare Other | Admitting: Urology

## 2017-02-28 ENCOUNTER — Ambulatory Visit: Payer: Medicare Other | Admitting: Internal Medicine

## 2017-03-03 ENCOUNTER — Encounter (HOSPITAL_COMMUNITY): Admission: RE | Admit: 2017-03-03 | Payer: Medicare Other | Source: Ambulatory Visit

## 2017-03-04 ENCOUNTER — Encounter: Payer: Self-pay | Admitting: Internal Medicine

## 2017-03-04 ENCOUNTER — Ambulatory Visit (INDEPENDENT_AMBULATORY_CARE_PROVIDER_SITE_OTHER): Payer: Medicare Other | Admitting: Internal Medicine

## 2017-03-04 VITALS — BP 125/76 | HR 92 | Temp 97.7°F | Ht 61.0 in | Wt 154.0 lb

## 2017-03-04 DIAGNOSIS — R150 Incomplete defecation: Secondary | ICD-10-CM

## 2017-03-04 NOTE — Progress Notes (Signed)
Primary Care Physician:  Celene Squibb, MD Primary Gastroenterologist:  Dr. Gala Romney  Pre-Procedure History & Physical: HPI:  Connie Wood is a 75 y.o. female here for follow-up of difficulty evacuating. Patient on multiple medications including opioids. From time to time takes either MiraLAX or  Imodium. Also, has been taking a probiotic and various fiber supplements. Describes [place] 1 stools most of the time; multiple stools daily sometimes she has some accidents on the regimen. Doesn't ever feel that she evacuates totally. Patient denies GERD. States she's not taking omeprazole at this time.  History of multiple colonic adenomas removed: Last year; due for surveillance exam in 2 years if overall health permits.  Past Medical History:  Diagnosis Date  . Back pain   . Cat scratch of right forearm 02/03/2014  . Hard of hearing   . Hemorrhoid 04/06/2014  . Hx of degenerative disc disease   . Hypercholesteremia 02/03/2014  . Hypercholesterolemia   . Hypertension   . Kidney stones   . Migraines   . Osteoarthritis   . Stroke [facility])    mini stroke, resolved in 30 minutes    Past Surgical History:  Procedure Laterality Date  . ABDOMINAL HYSTERECTOMY    . ANTERIOR AND POSTERIOR REPAIR    . APPENDECTOMY    . BILATERAL SALPINGOOPHORECTOMY    . BREAST SURGERY    . CHOLECYSTECTOMY    . colonoscopy  2003   Dr. Gala Romney: normal rectum, scattered diverticula  . COLONOSCOPY WITH PROPOFOL N/A 01/01/2016   Procedure: COLONOSCOPY WITH PROPOFOL;  Surgeon: Daneil Dolin, MD;  Location: [facility];  Service: Endoscopy;  Laterality: N/A;  1015  . ERCP with sphincterotomy  2003   Dr. Gala Romney: balloon dredging of bile duct, normal ampulla, choledocholithiasis   . EUS  2016   Dr. Amalia Hailey: subtle 13X17 mm heterogenous lesion in pancreas neck, FNA negative for malignancy   . POLYPECTOMY  01/01/2016   Procedure: POLYPECTOMY;  Surgeon: Daneil Dolin, MD;  Location: [facility];  Service:  Endoscopy;;   polypectomies x2-splenic flexure and cecal  . TONSILLECTOMY      Prior to Admission medications   Medication Sig Start Date End Date Taking? Authorizing Provider  acetaminophen (TYLENOL) 500 MG tablet Take 1,000 mg by mouth every 6 (six) hours as needed.   Yes [provider]  aspirin 81 MG chewable tablet Chew 81 mg by mouth as needed.   Yes [provider]  AZOR 10-40 MG per tablet Take 1 tablet by mouth daily. Reported on 12/04/2015 07/06/13  Yes [provider]  Calcium Citrate (CITRACAL PO) Take 1 tablet by mouth every evening.    Yes [provider]  Cholecalciferol (VITAMIN D PO) Take 1 tablet by mouth every evening.    Yes [provider]  HYDROcodone-acetaminophen (NORCO) 10-325 MG tablet Take 1 tablet by mouth 3 (three) times daily.    Yes [provider]  hydrOXYzine (ATARAX/VISTARIL) 25 MG tablet Take 50-100 mg by mouth every 4 (four) hours as needed for itching.   Yes [provider]  ibuprofen (ADVIL,MOTRIN) 200 MG tablet Take 400 mg by mouth every 6 (six) hours as needed for headache or moderate pain.   Yes [provider]  loperamide (IMODIUM) 2 MG capsule Take 4 mg by mouth as needed for diarrhea or loose stools.   Yes [provider]  ofloxacin (OCUFLOX) 0.3 % ophthalmic solution Place 2 drops into the right ear daily as needed (for  pain). 2 drops in right ear daily prn. 01/14/14  Yes [provider]  omeprazole (PRILOSEC) 20 MG capsule Take 20 mg by mouth daily.   Yes [provider]  psyllium (REGULOID) 0.52 g capsule Take 0.52 g by mouth daily. Takes 2 daily   Yes [provider]  umeclidinium-vilanterol (ANORO ELLIPTA) 62.5-25 MCG/INH AEPB Inhale 1 puff into the lungs as needed.    Yes [provider]  venlafaxine XR (EFFEXOR-XR) 75 MG 24 hr capsule Take 75 mg by mouth 2 (two) times daily. 02/02/16  Yes [provider]    Allergies as of  03/04/2017 - Review Complete 03/04/2017  Allergen Reaction Noted  . Adhesive [tape] Other (See Comments) 07/11/2014  . Doxycycline Nausea And Vomiting 03/04/2016  . Latex Itching and Rash 07/11/2014    Family History  Problem Relation Age of Onset  . Heart disease Mother   . Cancer Father        liver  . Cancer Son   . Heart disease Son   . Cancer Maternal Grandmother   . Colon cancer Neg Hx     Social History   Social History  . Marital status: Widowed    Spouse name: N/A  . Number of children: N/A  . Years of education: N/A   Occupational History  . Not on file.   Social History Main Topics  . Smoking status: Former Smoker    Packs/day: 1.00    Years: 50.00    Types: Cigarettes  . Smokeless tobacco: Never Used  . Alcohol use No  . Drug use: No  . Sexual activity: No     Comment: hyst   Other Topics Concern  . Not on file   Social History Narrative  . No narrative on file    Review of Systems: See HPI, otherwise negative ROS  Physical Exam: BP 125/76   Pulse 92   Temp 97.7 F (36.5 C) (Oral)   Ht 5\' 1"  (1.549 m)   Wt 154 lb (69.9 kg)   BMI 29.10 kg/m  General:   Alert, , pleasant and cooperative in NAD Neck:  Supple; no masses or thyromegaly. No significant cervical adenopathy. Lungs:  Clear throughout to auscultation.   No wheezes, crackles, or rhonchi. No acute distress. Heart:  Regular rate and rhythm; no murmurs, clicks, rubs,  or gallops. Abdomen: Non-distended, normal bowel sounds.  Soft and nontender without appreciable mass or hepatosplenomegaly.    Impression:  Chronically altered bowel function. I feel that her baseline is that of constipation with some episodes of fecal incontinence. I doubt she is having much in way of true diarrhea. She is on a multitude of supplements. This may be counterproductive.  Recommendations: Stop probiotic, fiber, miralax, imodium  Begin Linzess 72 daily x 2 weeks - samples provided  Call in 2 weeks for  a progress report and we will go from there.      Notice: This dictation was prepared with Dragon dictation along with smaller phrase technology. Any transcriptional errors that result from this process are unintentional and may not be corrected upon review.

## 2017-03-04 NOTE — Patient Instructions (Signed)
Stop probiotic, fiber, miralax, imodium  Begin Linzess 72 daily x 2 weeks - samples provided  Call in 2 weeks for a progress report and we will go from there.

## 2017-03-12 ENCOUNTER — Encounter (HOSPITAL_COMMUNITY)
Admission: RE | Admit: 2017-03-12 | Discharge: 2017-03-12 | Disposition: A | Discharge status: Home / Self Care | Payer: Medicare Other | Source: Ambulatory Visit | Attending: Internal Medicine | Admitting: Internal Medicine

## 2017-03-12 ENCOUNTER — Encounter (HOSPITAL_COMMUNITY): Payer: Self-pay

## 2017-03-12 DIAGNOSIS — M810 Age-related osteoporosis without current pathological fracture: Secondary | ICD-10-CM | POA: Diagnosis present

## 2017-03-12 LAB — RENAL FUNCTION PANEL
ALBUMIN: 4.1 g/dL (ref 3.5–5.0)
ANION GAP: 9 (ref 5–15)
BUN: 17 mg/dL (ref 6–20)
CHLORIDE: 102 mmol/L (ref 101–111)
CO2: 26 mmol/L (ref 22–32)
Calcium: 9.3 mg/dL (ref 8.9–10.3)
Creatinine, Ser: 0.83 mg/dL (ref 0.44–1.00)
Glucose, Bld: 112 mg/dL — ABNORMAL HIGH (ref 65–99)
PHOSPHORUS: 4 mg/dL (ref 2.5–4.6)
POTASSIUM: 3.7 mmol/L (ref 3.5–5.1)
Sodium: 137 mmol/L (ref 135–145)

## 2017-03-12 MED ORDER — DENOSUMAB 60 MG/ML ~~LOC~~ SOLN
60.0000 mg | Freq: Once | SUBCUTANEOUS | Status: AC
  Administered 2017-03-12: 60 mg via SUBCUTANEOUS
  Filled 2017-03-12: qty 1

## 2017-03-13 NOTE — Progress Notes (Signed)
Results for SHANTE, ARCHAMBEAULT (MRN [id number]) as of 03/13/2017 14:09  Ref. Range 03/12/2017 11:57  Sodium Latest Ref Range: 135 - 145 mmol/L 137  Potassium Latest Ref Range: 3.5 - 5.1 mmol/L 3.7  Chloride Latest Ref Range: 101 - 111 mmol/L 102  CO2 Latest Ref Range: 22 - 32 mmol/L 26  Glucose Latest Ref Range: 65 - 99 mg/dL 112 (H)  BUN Latest Ref Range: 6 - 20 mg/dL 17  Creatinine Latest Ref Range: 0.44 - 1.00 mg/dL 0.83  Calcium Latest Ref Range: 8.9 - 10.3 mg/dL 9.3  Anion gap Latest Ref Range: 5 - 15  9  Phosphorus Latest Ref Range: 2.5 - 4.6 mg/dL 4.0  Albumin Latest Ref Range: 3.5 - 5.0 g/dL 4.1  GFR, Est Non African American Latest Ref Range: >60 mL/min >60  GFR, Est African American Latest Ref Range: >60 mL/min >60

## 2017-03-24 ENCOUNTER — Ambulatory Visit (INDEPENDENT_AMBULATORY_CARE_PROVIDER_SITE_OTHER): Payer: Medicare Other | Admitting: Otolaryngology

## 2017-03-24 DIAGNOSIS — H7203 Central perforation of tympanic membrane, bilateral: Secondary | ICD-10-CM | POA: Diagnosis not present

## 2017-03-24 DIAGNOSIS — R490 Dysphonia: Secondary | ICD-10-CM | POA: Diagnosis not present

## 2017-03-24 DIAGNOSIS — K219 Gastro-esophageal reflux disease without esophagitis: Secondary | ICD-10-CM | POA: Diagnosis not present

## 2017-03-24 DIAGNOSIS — H906 Mixed conductive and sensorineural hearing loss, bilateral: Secondary | ICD-10-CM | POA: Diagnosis not present

## 2017-03-26 ENCOUNTER — Telehealth: Payer: Self-pay | Admitting: Internal Medicine

## 2017-03-26 ENCOUNTER — Ambulatory Visit (INDEPENDENT_AMBULATORY_CARE_PROVIDER_SITE_OTHER): Payer: Medicare Other | Admitting: Urology

## 2017-03-26 DIAGNOSIS — R351 Nocturia: Secondary | ICD-10-CM | POA: Diagnosis not present

## 2017-03-26 DIAGNOSIS — R3915 Urgency of urination: Secondary | ICD-10-CM | POA: Diagnosis not present

## 2017-03-26 NOTE — Telephone Encounter (Signed)
Pt called to let RMR know that the Linzess she is on isn't helping. She is having constant diarrhea and not able to control her bowels. She said that she wasn't going to take Linzess anymore and could he recommend something else for her. [phone]

## 2017-03-26 NOTE — Telephone Encounter (Signed)
Dr.Rourk, you asked the pt to call in 2 weeks with a PR. Do you want to try her on something different?

## 2017-03-27 NOTE — Telephone Encounter (Signed)
Pt is aware. She said she would try it.

## 2017-03-27 NOTE — Telephone Encounter (Signed)
Progress noted. Agree with stopping lenses. I recommend we try a dedicated substantial fiber regimen for 3 or 4 weeks Before doing anything else.  Start Benefiber 1 tablespoon twice daily. Take everyday without fail. Let us know how this is working in 3 weeks and we will go from there.

## 2017-04-14 ENCOUNTER — Telehealth: Payer: Self-pay | Admitting: Internal Medicine

## 2017-04-14 NOTE — Telephone Encounter (Signed)
Returned call at 2:45pm, no anwser, will call back

## 2017-04-14 NOTE — Telephone Encounter (Signed)
Pt called asking for RMR nurse. I told her everyone was with patients and I would have to take a message. She said that her phone isn't working right where she doesn't know when the phone is ringing. She said that the medication she is on isn't helping and she isn't getting any better and would like to speak with nurse. Patient said that she would call back later this afternoon to speak with nurse. [phone]

## 2017-04-15 ENCOUNTER — Encounter: Payer: Self-pay | Admitting: Internal Medicine

## 2017-04-15 NOTE — Telephone Encounter (Signed)
Routing message to schedule apt.

## 2017-04-15 NOTE — Telephone Encounter (Signed)
Patient called in complaining about having abd pain and diarrhea daily.    She is not having any fever, nausea or rectal bleeding.  Patient has been taking Loperamide hydrochloride 2 mg tablet when she has an episode of diarrhea.   She has also been taking a tablespoon of Benefiber twice a day.   She wanted to know if Dr. Gala Romney could recommend something for her to take.   Patient can be reached at [phone].

## 2017-04-15 NOTE — Telephone Encounter (Signed)
PATIENT SCHEDULED  °

## 2017-04-15 NOTE — Telephone Encounter (Signed)
Stop Linzess; continue loperamide if needed for diarrhea.   Go ahead and schedule  an office visit with extender for 6 weeks from now

## 2017-04-15 NOTE — Telephone Encounter (Signed)
Pt has already been scheduled and is aware of meds she should d/c and continue.

## 2017-05-02 ENCOUNTER — Telehealth: Payer: Self-pay

## 2017-05-02 NOTE — Telephone Encounter (Signed)
Move her appointment up to next week with extender

## 2017-05-02 NOTE — Telephone Encounter (Signed)
Routing to schedule apt with extender.

## 2017-05-02 NOTE — Telephone Encounter (Signed)
Pt left a VM 05/01/17 @ 4:50pm stating she is still having stomach pain.  Returned pts call 05/02/17 @ 8:00 AM. Pt has c/o stomach pain and being afraid to eat. When pt has to go out , she's afraid that she is going to have watery diarrhea. Pt will skip a day of eating in fear that her stomach will bother her. Pt is taking GasX for gas if needed, diarrhea pills only when needed. Pt is taking Azo for frequent urination during the day and night. Pt said it's an all natural medication. For pts pain, she does take Hydrocodone 3 times daily even when she hasn't eaten anything. Pts next appointment is 06/04/17 with LSL. Please advise.

## 2017-05-26 ENCOUNTER — Ambulatory Visit: Payer: Medicare Other | Admitting: Gastroenterology

## 2017-06-04 ENCOUNTER — Ambulatory Visit: Payer: Medicare Other | Admitting: Gastroenterology

## 2017-06-25 ENCOUNTER — Ambulatory Visit: Payer: Medicare Other | Admitting: Urology

## 2017-07-24 ENCOUNTER — Ambulatory Visit (INDEPENDENT_AMBULATORY_CARE_PROVIDER_SITE_OTHER): Payer: Medicare Other | Admitting: Otolaryngology

## 2017-07-24 DIAGNOSIS — K219 Gastro-esophageal reflux disease without esophagitis: Secondary | ICD-10-CM | POA: Diagnosis not present

## 2017-07-24 DIAGNOSIS — R490 Dysphonia: Secondary | ICD-10-CM

## 2017-07-24 DIAGNOSIS — H7203 Central perforation of tympanic membrane, bilateral: Secondary | ICD-10-CM | POA: Diagnosis not present

## 2017-07-24 DIAGNOSIS — H906 Mixed conductive and sensorineural hearing loss, bilateral: Secondary | ICD-10-CM

## 2017-07-30 ENCOUNTER — Ambulatory Visit: Payer: Medicare Other | Admitting: Gastroenterology

## 2017-08-26 ENCOUNTER — Telehealth: Payer: Self-pay | Admitting: Adult Health

## 2017-08-26 NOTE — Telephone Encounter (Signed)
Patient called stating that she would like to speak with Anderson Malta, Patient did not state the reason why. Please contact pt

## 2017-08-26 NOTE — Telephone Encounter (Signed)
Pt complaining of night sweats to make appt

## 2017-09-04 ENCOUNTER — Ambulatory Visit: Payer: Medicare Other | Admitting: Adult Health

## 2017-09-09 ENCOUNTER — Encounter (HOSPITAL_COMMUNITY): Admission: RE | Admit: 2017-09-09 | Payer: Medicare Other | Source: Ambulatory Visit

## 2017-09-09 ENCOUNTER — Other Ambulatory Visit (HOSPITAL_COMMUNITY): Payer: Medicare Other

## 2017-09-10 ENCOUNTER — Encounter (INDEPENDENT_AMBULATORY_CARE_PROVIDER_SITE_OTHER): Payer: Self-pay

## 2017-09-10 ENCOUNTER — Telehealth: Payer: Self-pay | Admitting: Obstetrics & Gynecology

## 2017-09-10 ENCOUNTER — Encounter: Payer: Self-pay | Admitting: Adult Health

## 2017-09-10 ENCOUNTER — Ambulatory Visit: Payer: Medicare Other | Admitting: Adult Health

## 2017-09-10 VITALS — BP 140/80 | HR 62 | Ht 61.0 in | Wt 145.0 lb

## 2017-09-10 DIAGNOSIS — R61 Generalized hyperhidrosis: Secondary | ICD-10-CM

## 2017-09-10 NOTE — Telephone Encounter (Signed)
Spoke with Connie Wood to have Akhila take 1 atarax at [facility]

## 2017-09-10 NOTE — Progress Notes (Signed)
Subjective:     Patient ID: Connie Wood, female   DOB: 1940/11/16, 77 y.o.   MRN: [id number]  HPI Connie Wood is a 77 year old white female in complaining of night sweats. PCP is Dr Nevada Crane.  Review of Systems +night sweats Reviewed past medical,surgical, social and family history. Reviewed medications and allergies.     Objective:   Physical Exam BP 140/80 (BP Location: Left Arm, Patient Position: Sitting, Cuff Size: Normal)   Pulse 62   Ht 5\' 1"  (1.549 m)   Wt 145 lb (65.8 kg)   BMI 27.40 kg/m  Skin warm and dry.  Lungs: clear to ausculation bilaterally. Cardiovascular: regular rate and rhythm. Has mole left ear(she wants it removed), has tick in left side face.Has swelling in ankles,can see where socks fit.    Assessment:     1. Night sweats       Plan:     Try taking effexor 1 bid Take vistaril 1 at HS Can try evening primrose oil  See neurologist for tick  See dermatologist for ear F/U prn

## 2017-09-11 ENCOUNTER — Encounter (HOSPITAL_COMMUNITY)
Admission: RE | Admit: 2017-09-11 | Discharge: 2017-09-11 | Disposition: A | Discharge status: Home / Self Care | Payer: Medicare Other | Source: Ambulatory Visit | Attending: Internal Medicine | Admitting: Internal Medicine

## 2017-09-11 ENCOUNTER — Encounter (HOSPITAL_COMMUNITY): Admission: RE | Admit: 2017-09-11 | Payer: Medicare Other | Source: Ambulatory Visit

## 2017-09-11 ENCOUNTER — Encounter (HOSPITAL_COMMUNITY): Payer: Medicare Other

## 2017-09-11 ENCOUNTER — Encounter (HOSPITAL_COMMUNITY): Payer: Self-pay

## 2017-09-11 DIAGNOSIS — M810 Age-related osteoporosis without current pathological fracture: Secondary | ICD-10-CM | POA: Diagnosis not present

## 2017-09-11 MED ORDER — DENOSUMAB 60 MG/ML ~~LOC~~ SOLN
60.0000 mg | Freq: Once | SUBCUTANEOUS | Status: AC
  Administered 2017-09-11: 60 mg via SUBCUTANEOUS
  Filled 2017-09-11: qty 1

## 2017-09-17 ENCOUNTER — Ambulatory Visit: Payer: Medicare Other | Admitting: Urology

## 2017-10-20 ENCOUNTER — Ambulatory Visit (INDEPENDENT_AMBULATORY_CARE_PROVIDER_SITE_OTHER): Payer: Medicare Other | Admitting: Otolaryngology

## 2017-10-20 DIAGNOSIS — H7203 Central perforation of tympanic membrane, bilateral: Secondary | ICD-10-CM

## 2017-10-20 DIAGNOSIS — R490 Dysphonia: Secondary | ICD-10-CM

## 2017-10-20 DIAGNOSIS — J382 Nodules of vocal cords: Secondary | ICD-10-CM | POA: Diagnosis not present

## 2017-10-22 ENCOUNTER — Ambulatory Visit: Payer: Medicare Other | Admitting: Urology

## 2017-10-22 DIAGNOSIS — R351 Nocturia: Secondary | ICD-10-CM

## 2017-10-22 DIAGNOSIS — R3915 Urgency of urination: Secondary | ICD-10-CM | POA: Diagnosis not present

## 2017-12-04 ENCOUNTER — Other Ambulatory Visit (INDEPENDENT_AMBULATORY_CARE_PROVIDER_SITE_OTHER): Payer: Self-pay | Admitting: Otolaryngology

## 2017-12-04 ENCOUNTER — Other Ambulatory Visit (HOSPITAL_COMMUNITY): Payer: Self-pay | Admitting: Neurology

## 2017-12-04 DIAGNOSIS — I639 Cerebral infarction, unspecified: Secondary | ICD-10-CM

## 2017-12-04 DIAGNOSIS — R29810 Facial weakness: Secondary | ICD-10-CM

## 2018-03-05 ENCOUNTER — Ambulatory Visit (INDEPENDENT_AMBULATORY_CARE_PROVIDER_SITE_OTHER): Payer: Medicare Other | Admitting: Otolaryngology

## 2018-03-05 DIAGNOSIS — H7203 Central perforation of tympanic membrane, bilateral: Secondary | ICD-10-CM

## 2018-03-05 DIAGNOSIS — H906 Mixed conductive and sensorineural hearing loss, bilateral: Secondary | ICD-10-CM | POA: Diagnosis not present

## 2018-03-16 ENCOUNTER — Ambulatory Visit (HOSPITAL_COMMUNITY): Payer: Medicare Other

## 2018-03-16 ENCOUNTER — Other Ambulatory Visit (HOSPITAL_COMMUNITY): Payer: Medicare Other

## 2018-03-20 ENCOUNTER — Telehealth: Payer: Self-pay | Admitting: Adult Health

## 2018-03-20 NOTE — Telephone Encounter (Signed)
Patient called stating that she is decongestant and she states that she would like Anderson Malta to call her in a antibiotic to [facility]. Pt also states that she has been having really bad night sweats and Anderson Malta has prescribed her a medication in 2017 that helped her really well. EFFEXOR-XR) 75 mg 30 pills 3 refill. Please contact pt

## 2018-03-20 NOTE — Telephone Encounter (Signed)
No answer and no voicemail @ 1:46 pm. JSY

## 2018-03-21 ENCOUNTER — Other Ambulatory Visit: Payer: Self-pay | Admitting: Adult Health

## 2018-03-23 ENCOUNTER — Telehealth: Payer: Self-pay | Admitting: Adult Health

## 2018-03-23 ENCOUNTER — Telehealth: Payer: Self-pay | Admitting: *Deleted

## 2018-03-23 NOTE — Telephone Encounter (Signed)
No answer

## 2018-03-23 NOTE — Telephone Encounter (Signed)
No answer and no voicemail @ 4:50 pm. JSY

## 2018-03-24 NOTE — Telephone Encounter (Signed)
No answer and no voicemail. 3 attempts to reach pt. Encounter closed. [place]

## 2018-04-07 ENCOUNTER — Ambulatory Visit (HOSPITAL_COMMUNITY): Payer: Medicare Other

## 2018-04-07 ENCOUNTER — Encounter (HOSPITAL_COMMUNITY): Payer: Self-pay

## 2018-04-07 ENCOUNTER — Inpatient Hospital Stay (HOSPITAL_COMMUNITY): Admission: RE | Admit: 2018-04-07 | Payer: Medicare Other | Source: Ambulatory Visit

## 2018-04-07 ENCOUNTER — Encounter (HOSPITAL_COMMUNITY)
Admission: RE | Admit: 2018-04-07 | Discharge: 2018-04-07 | Disposition: A | Discharge status: Home / Self Care | Payer: Medicare Other | Source: Ambulatory Visit | Attending: Internal Medicine | Admitting: Internal Medicine

## 2018-04-07 ENCOUNTER — Encounter (HOSPITAL_COMMUNITY): Payer: Medicare Other

## 2018-04-07 DIAGNOSIS — M810 Age-related osteoporosis without current pathological fracture: Secondary | ICD-10-CM | POA: Insufficient documentation

## 2018-04-07 MED ORDER — DENOSUMAB 60 MG/ML ~~LOC~~ SOSY
60.0000 mg | PREFILLED_SYRINGE | Freq: Once | SUBCUTANEOUS | Status: AC
  Administered 2018-04-07: 60 mg via SUBCUTANEOUS
  Filled 2018-04-07: qty 1

## 2018-04-20 ENCOUNTER — Other Ambulatory Visit (HOSPITAL_COMMUNITY): Payer: Self-pay | Admitting: Internal Medicine

## 2018-04-20 DIAGNOSIS — Z1231 Encounter for screening mammogram for malignant neoplasm of breast: Secondary | ICD-10-CM

## 2018-04-22 ENCOUNTER — Encounter (HOSPITAL_COMMUNITY): Payer: Self-pay | Admitting: Emergency Medicine

## 2018-04-22 ENCOUNTER — Emergency Department (HOSPITAL_COMMUNITY): Payer: Medicare Other

## 2018-04-22 ENCOUNTER — Inpatient Hospital Stay (HOSPITAL_COMMUNITY)
Admission: EM | Admit: 2018-04-22 | Discharge: 2018-04-25 | DRG: 280 | Disposition: A | Discharge status: Home / Self Care | Payer: Medicare Other | Attending: Internal Medicine | Admitting: Internal Medicine

## 2018-04-22 ENCOUNTER — Other Ambulatory Visit: Payer: Self-pay

## 2018-04-22 DIAGNOSIS — Z91040 Latex allergy status: Secondary | ICD-10-CM

## 2018-04-22 DIAGNOSIS — I10 Essential (primary) hypertension: Secondary | ICD-10-CM | POA: Diagnosis present

## 2018-04-22 DIAGNOSIS — R2689 Other abnormalities of gait and mobility: Secondary | ICD-10-CM

## 2018-04-22 DIAGNOSIS — Z79899 Other long term (current) drug therapy: Secondary | ICD-10-CM

## 2018-04-22 DIAGNOSIS — Z881 Allergy status to other antibiotic agents status: Secondary | ICD-10-CM

## 2018-04-22 DIAGNOSIS — J449 Chronic obstructive pulmonary disease, unspecified: Secondary | ICD-10-CM | POA: Diagnosis present

## 2018-04-22 DIAGNOSIS — D539 Nutritional anemia, unspecified: Secondary | ICD-10-CM | POA: Diagnosis present

## 2018-04-22 DIAGNOSIS — R0602 Shortness of breath: Secondary | ICD-10-CM

## 2018-04-22 DIAGNOSIS — J42 Unspecified chronic bronchitis: Secondary | ICD-10-CM | POA: Diagnosis present

## 2018-04-22 DIAGNOSIS — N179 Acute kidney failure, unspecified: Secondary | ICD-10-CM | POA: Diagnosis present

## 2018-04-22 DIAGNOSIS — I959 Hypotension, unspecified: Secondary | ICD-10-CM | POA: Diagnosis present

## 2018-04-22 DIAGNOSIS — Z87891 Personal history of nicotine dependence: Secondary | ICD-10-CM

## 2018-04-22 DIAGNOSIS — G8929 Other chronic pain: Secondary | ICD-10-CM | POA: Diagnosis present

## 2018-04-22 DIAGNOSIS — R61 Generalized hyperhidrosis: Secondary | ICD-10-CM

## 2018-04-22 DIAGNOSIS — J9621 Acute and chronic respiratory failure with hypoxia: Secondary | ICD-10-CM | POA: Diagnosis present

## 2018-04-22 DIAGNOSIS — E669 Obesity, unspecified: Secondary | ICD-10-CM | POA: Diagnosis present

## 2018-04-22 DIAGNOSIS — H9190 Unspecified hearing loss, unspecified ear: Secondary | ICD-10-CM | POA: Diagnosis present

## 2018-04-22 DIAGNOSIS — Z6825 Body mass index (BMI) 25.0-25.9, adult: Secondary | ICD-10-CM

## 2018-04-22 DIAGNOSIS — Z7982 Long term (current) use of aspirin: Secondary | ICD-10-CM

## 2018-04-22 DIAGNOSIS — I2542 Coronary artery dissection: Secondary | ICD-10-CM | POA: Diagnosis present

## 2018-04-22 DIAGNOSIS — R748 Abnormal levels of other serum enzymes: Secondary | ICD-10-CM

## 2018-04-22 DIAGNOSIS — Z8673 Personal history of transient ischemic attack (TIA), and cerebral infarction without residual deficits: Secondary | ICD-10-CM

## 2018-04-22 DIAGNOSIS — I214 Non-ST elevation (NSTEMI) myocardial infarction: Principal | ICD-10-CM | POA: Diagnosis present

## 2018-04-22 DIAGNOSIS — J209 Acute bronchitis, unspecified: Secondary | ICD-10-CM | POA: Diagnosis present

## 2018-04-22 DIAGNOSIS — E7800 Pure hypercholesterolemia, unspecified: Secondary | ICD-10-CM | POA: Diagnosis present

## 2018-04-22 DIAGNOSIS — I2510 Atherosclerotic heart disease of native coronary artery without angina pectoris: Secondary | ICD-10-CM | POA: Diagnosis present

## 2018-04-22 HISTORY — DX: Personal history of urinary calculi: Z87.442

## 2018-04-22 NOTE — ED Triage Notes (Addendum)
Pt c/o generalized body pain with episodes of sweating since last night, pt states she feels like she has no control over her body and has fingertip pain

## 2018-04-22 NOTE — ED Provider Notes (Signed)
[facility] EMERGENCY DEPARTMENT Provider Note   CSN: [id number] Arrival date & time: 04/22/18  2221     History   Chief Complaint Chief Complaint  Patient presents with  . Generalized Body Aches    HPI Connie Wood is a 77 y.o. female.  Patient is a 77 year old female who presents to the emergency department with a complaint of generalized body aches.  Patient states that this problem started on yesterday October 29.  She says she was having episodes of aching all over.  She felt as though she did not have control of her body at times.  And she began to have some pain and numbness and tingling of her fingertips.  During the night she developed sweating.  Patient states that she had difficulty getting up and down the stairs that she uses.  It is of note that she uses a walker or cane, but states that she had more problem than usual on October 29.  Today the patient states that these episodes have been coming and going.  She is been having coughing since yesterday.  She has a history of bladder problems, but states she has not had any new bladder problems.  She has not had any blood in her urine, or blood in her stool.  The patient denies any chest pain, or palpitations.  There is been no recent changes in her medications or her diet.  The patient has not been out of the country recently.  She presents now for assistance with this issue.  The history is provided by the patient and a relative.    Past Medical History:  Diagnosis Date  . Back pain   . Cat scratch of right forearm 02/03/2014  . Hard of hearing   . Hemorrhoid 04/06/2014  . Hx of degenerative disc disease   . Hypercholesteremia 02/03/2014  . Hypercholesterolemia   . Hypertension   . Kidney stones   . Migraines   . Osteoarthritis   . Stroke [facility])    mini stroke, resolved in 30 minutes    Patient Active Problem List   Diagnosis Date Noted  . IBS (irritable bowel syndrome) 01/06/2017  . Constipation  07/17/2016  . History of colonic polyps   . Encounter for screening colonoscopy 12/04/2015  . Common bile duct dilation 08/24/2014  . Acute pancreatitis   . H/O acute pancreatitis 07/11/2014  . Anal itching 04/06/2014  . Hemorrhoid 04/06/2014  . Hypertension 02/03/2014  . Hypercholesteremia 02/03/2014  . Cat scratch of right forearm 02/03/2014  . Dyspnea 07/12/2013  . Bronchitis 07/11/2013  . Acute bronchitis 07/11/2013  . Nonhealing nonsurgical wound 04/05/2013  . Stasis edema 04/05/2013    Past Surgical History:  Procedure Laterality Date  . ABDOMINAL HYSTERECTOMY    . ANTERIOR AND POSTERIOR REPAIR    . APPENDECTOMY    . BILATERAL SALPINGOOPHORECTOMY    . BREAST SURGERY    . CHOLECYSTECTOMY    . colonoscopy  2003   Dr. Gala Romney: normal rectum, scattered diverticula  . COLONOSCOPY WITH PROPOFOL N/A 01/01/2016   Procedure: COLONOSCOPY WITH PROPOFOL;  Surgeon: Daneil Dolin, MD;  Location: [facility];  Service: Endoscopy;  Laterality: N/A;  1015  . ERCP with sphincterotomy  2003   Dr. Gala Romney: balloon dredging of bile duct, normal ampulla, choledocholithiasis   . EUS  2016   Dr. Amalia Hailey: subtle 13X17 mm heterogenous lesion in pancreas neck, FNA negative for malignancy   . POLYPECTOMY  01/01/2016   Procedure: POLYPECTOMY;  Surgeon: Daneil Dolin, MD;  Location: [facility];  Service: Endoscopy;;   polypectomies x2-splenic flexure and cecal  . TONSILLECTOMY       OB History    Gravida  5   Para  3   Term      Preterm      AB  2   Living  3     SAB  2   TAB      Ectopic      Multiple      Live Births               Home Medications    Prior to Admission medications   Medication Sig Start Date End Date Taking? Authorizing Provider  acetaminophen (TYLENOL) 500 MG tablet Take 1,000 mg by mouth every 6 (six) hours as needed.    [provider]  aspirin 81 MG chewable tablet Chew 81 mg by mouth as needed.    [provider]  AZOR 10-40  MG per tablet Take 1 tablet by mouth daily. Reported on 12/04/2015 07/06/13   [provider]  Calcium Citrate (CITRACAL PO) Take 1 tablet by mouth every evening.     [provider]  Cholecalciferol (VITAMIN D PO) Take 1 tablet by mouth every evening.     [provider]  HYDROcodone-acetaminophen (NORCO) 10-325 MG tablet Take 1 tablet by mouth 3 (three) times daily.     [provider]  hydrOXYzine (ATARAX/VISTARIL) 25 MG tablet Take 50-100 mg by mouth every 4 (four) hours as needed for itching.    [provider]  ibuprofen (ADVIL,MOTRIN) 200 MG tablet Take 400 mg by mouth every 6 (six) hours as needed for headache or moderate pain.    [provider]  loperamide (IMODIUM) 2 MG capsule Take 4 mg by mouth as needed for diarrhea or loose stools.    [provider]  ofloxacin (OCUFLOX) 0.3 % ophthalmic solution Place 2 drops into the right ear daily as needed (for pain). 2 drops in right ear daily prn. 01/14/14   [provider]  omeprazole (PRILOSEC) 20 MG capsule Take 20 mg by mouth daily.    [provider]  psyllium (REGULOID) 0.52 g capsule Take 0.52 g by mouth daily. Takes 2 daily    [provider]  umeclidinium-vilanterol (ANORO ELLIPTA) 62.5-25 MCG/INH AEPB Inhale 1 puff into the lungs as needed.     [provider]  venlafaxine XR (EFFEXOR-XR) 75 MG 24 hr capsule Take 75 mg by mouth 2 (two) times daily. 02/02/16   [provider]    Family History Family History  Problem Relation Age of Onset  . Heart disease Mother   . Cancer Father        liver  . Cancer Son   . Heart disease Son   . Cancer Maternal Grandmother   . Colon cancer Neg Hx     Social History Social History   Tobacco Use  . Smoking status: Former Smoker    Packs/day: 1.00    Years: 50.00    Pack years: 50.00    Types: Cigarettes  . Smokeless tobacco: Never Used  Substance Use Topics  . Alcohol use: No     Alcohol/week: 0.0 standard drinks  . Drug use: No     Allergies   Adhesive [tape]; Doxycycline; and Latex   Review of Systems Review of Systems  Constitutional: Positive for activity change, diaphoresis and fatigue.  All ROS Neg except as noted in HPI  HENT: Negative for ear pain, nosebleeds and sore throat.   Eyes: Negative for photophobia and discharge.  Respiratory: Positive for cough. Negative for shortness of breath and wheezing.   Cardiovascular: Negative for chest pain, palpitations and leg swelling.  Gastrointestinal: Negative for abdominal pain, blood in stool, diarrhea, nausea and vomiting.  Genitourinary: Negative for dysuria, frequency, hematuria and vaginal bleeding.  Musculoskeletal: Negative for arthralgias, back pain and neck pain.  Skin: Negative.   Neurological: Positive for light-headedness. Negative for dizziness, seizures, syncope and speech difficulty.  Psychiatric/Behavioral: Negative for confusion and hallucinations.     Physical Exam Updated Vital Signs BP (!) 74/38 (BP Location: Right Arm)   Pulse 71   Temp 97.8 F (36.6 C) (Oral)   Resp 19   SpO2 96%   Physical Exam  Constitutional: She is oriented to person, place, and time. She appears well-developed and well-nourished.  Non-toxic appearance.  HENT:  Head: Normocephalic.  Right Ear: Tympanic membrane and external ear normal.  Left Ear: Tympanic membrane and external ear normal.  Eyes: Pupils are equal, round, and reactive to light. EOM and lids are normal.  Ptosis left eye (not new)   Neck: Normal range of motion. Neck supple. Carotid bruit is not present. No tracheal deviation present.  Cardiovascular: Normal rate, regular rhythm, normal heart sounds, intact distal pulses and normal pulses. Exam reveals no gallop and no friction rub.  Pulmonary/Chest: No respiratory distress. She has rhonchi.  Abdominal: Soft. Bowel sounds are normal. There is no tenderness. There is no guarding.    Musculoskeletal: Normal range of motion. She exhibits no edema.  Neg Homan's sign.  Lymphadenopathy:       Head (right side): No submandibular adenopathy present.       Head (left side): No submandibular adenopathy present.    She has no cervical adenopathy.  Neurological: She is alert and oriented to person, place, and time. She has normal strength. No cranial nerve deficit or sensory deficit. She exhibits normal muscle tone. Coordination normal.  Grip is symmetrical Lower extremity strength is symmetrical.  Skin: Skin is warm and dry. Capillary refill takes less than 2 seconds.  Psychiatric: She has a normal mood and affect. Her speech is normal.  Nursing note and vitals reviewed.    ED Treatments / Results  Labs (all labs ordered are listed, but only abnormal results are displayed) Labs Reviewed - No data to display  EKG None  Radiology No results found.  Procedures Procedures (including critical care time)  Medications Ordered in ED Medications - No data to display   Initial Impression / Assessment and Plan / ED Course  I have reviewed the triage vital signs and the nursing notes.  Pertinent labs & imaging results that were available during my care of the patient were reviewed by me and considered in my medical decision making (see chart for details).       Final Clinical Impressions(s) / ED Diagnoses MDM  Patient states she is having episodes in which she feels as though she is losing control of her body.  She is also having cough, and generally not feeling well.  She has not had a temperature elevation that she is aware of.  Will obtain a chest x-ray to evaluate for pneumonia or any lung related issue.  We will also check a troponin to rule out atypical coronary event.  Initial blood pressure was low at 92/59, will obtain lactic acid and  comprehensive metabolic panel.  Influenza panel pending.  Blood pressure significantly improved.  Blood pressure now 137/64  and 135/81.  Patient ambulated to the bathroom with minimal problem.  Complete blood count is nonacute.  Lactic acid is 1.  Chest x-ray shows no acute cardiopulmonary process.  CMP and troponin, and influenza pending. Pt care to be continued by Dr Meda Coffee. Tomi Bamberger.   Final diagnoses:  Diaphoresis  Shortness of breath  Abnormal cardiac enzyme level  Balance problems    ED Discharge Orders    None       Lily Kocher, PA-C 04/23/18 1250    Noemi Chapel, MD 04/23/18 [phone]

## 2018-04-23 ENCOUNTER — Emergency Department (HOSPITAL_COMMUNITY): Payer: Medicare Other

## 2018-04-23 ENCOUNTER — Encounter (HOSPITAL_COMMUNITY): Payer: Self-pay | Admitting: Family Medicine

## 2018-04-23 ENCOUNTER — Encounter (HOSPITAL_COMMUNITY)
Admission: EM | Disposition: A | Discharge status: Home / Self Care | Payer: Self-pay | Source: Home / Self Care | Attending: Internal Medicine

## 2018-04-23 ENCOUNTER — Observation Stay (HOSPITAL_COMMUNITY): Payer: Medicare Other

## 2018-04-23 DIAGNOSIS — Z7982 Long term (current) use of aspirin: Secondary | ICD-10-CM | POA: Diagnosis not present

## 2018-04-23 DIAGNOSIS — J42 Unspecified chronic bronchitis: Secondary | ICD-10-CM

## 2018-04-23 DIAGNOSIS — Z91040 Latex allergy status: Secondary | ICD-10-CM | POA: Diagnosis not present

## 2018-04-23 DIAGNOSIS — I2510 Atherosclerotic heart disease of native coronary artery without angina pectoris: Secondary | ICD-10-CM

## 2018-04-23 DIAGNOSIS — E7800 Pure hypercholesterolemia, unspecified: Secondary | ICD-10-CM | POA: Diagnosis present

## 2018-04-23 DIAGNOSIS — J209 Acute bronchitis, unspecified: Secondary | ICD-10-CM | POA: Diagnosis present

## 2018-04-23 DIAGNOSIS — Z6825 Body mass index (BMI) 25.0-25.9, adult: Secondary | ICD-10-CM | POA: Diagnosis not present

## 2018-04-23 DIAGNOSIS — N289 Disorder of kidney and ureter, unspecified: Secondary | ICD-10-CM | POA: Diagnosis not present

## 2018-04-23 DIAGNOSIS — Z79899 Other long term (current) drug therapy: Secondary | ICD-10-CM | POA: Diagnosis not present

## 2018-04-23 DIAGNOSIS — I959 Hypotension, unspecified: Secondary | ICD-10-CM | POA: Diagnosis present

## 2018-04-23 DIAGNOSIS — H9190 Unspecified hearing loss, unspecified ear: Secondary | ICD-10-CM | POA: Diagnosis present

## 2018-04-23 DIAGNOSIS — G8929 Other chronic pain: Secondary | ICD-10-CM | POA: Diagnosis present

## 2018-04-23 DIAGNOSIS — J449 Chronic obstructive pulmonary disease, unspecified: Secondary | ICD-10-CM | POA: Diagnosis present

## 2018-04-23 DIAGNOSIS — N179 Acute kidney failure, unspecified: Secondary | ICD-10-CM | POA: Diagnosis present

## 2018-04-23 DIAGNOSIS — D539 Nutritional anemia, unspecified: Secondary | ICD-10-CM | POA: Diagnosis present

## 2018-04-23 DIAGNOSIS — J9621 Acute and chronic respiratory failure with hypoxia: Secondary | ICD-10-CM | POA: Diagnosis present

## 2018-04-23 DIAGNOSIS — Z881 Allergy status to other antibiotic agents status: Secondary | ICD-10-CM | POA: Diagnosis not present

## 2018-04-23 DIAGNOSIS — E669 Obesity, unspecified: Secondary | ICD-10-CM | POA: Diagnosis present

## 2018-04-23 DIAGNOSIS — Z87891 Personal history of nicotine dependence: Secondary | ICD-10-CM | POA: Diagnosis not present

## 2018-04-23 DIAGNOSIS — Z8673 Personal history of transient ischemic attack (TIA), and cerebral infarction without residual deficits: Secondary | ICD-10-CM | POA: Diagnosis not present

## 2018-04-23 DIAGNOSIS — I10 Essential (primary) hypertension: Secondary | ICD-10-CM | POA: Diagnosis present

## 2018-04-23 DIAGNOSIS — R748 Abnormal levels of other serum enzymes: Secondary | ICD-10-CM

## 2018-04-23 DIAGNOSIS — I214 Non-ST elevation (NSTEMI) myocardial infarction: Secondary | ICD-10-CM | POA: Diagnosis present

## 2018-04-23 DIAGNOSIS — I2542 Coronary artery dissection: Secondary | ICD-10-CM | POA: Diagnosis present

## 2018-04-23 HISTORY — PX: LEFT HEART CATH AND CORONARY ANGIOGRAPHY: CATH118249

## 2018-04-23 LAB — COMPREHENSIVE METABOLIC PANEL
ALT: 19 U/L (ref 0–44)
AST: 28 U/L (ref 15–41)
Albumin: 3.9 g/dL (ref 3.5–5.0)
Alkaline Phosphatase: 60 U/L (ref 38–126)
Anion gap: 8 (ref 5–15)
BILIRUBIN TOTAL: 0.6 mg/dL (ref 0.3–1.2)
BUN: 23 mg/dL (ref 8–23)
CO2: 23 mmol/L (ref 22–32)
CREATININE: 1.07 mg/dL — AB (ref 0.44–1.00)
Calcium: 8.3 mg/dL — ABNORMAL LOW (ref 8.9–10.3)
Chloride: 106 mmol/L (ref 98–111)
GFR calc Af Amer: 57 mL/min — ABNORMAL LOW (ref >60)
GFR, EST NON AFRICAN AMERICAN: 49 mL/min — AB (ref >60)
Glucose, Bld: 122 mg/dL — ABNORMAL HIGH (ref 70–99)
POTASSIUM: 3.6 mmol/L (ref 3.5–5.1)
Sodium: 137 mmol/L (ref 135–145)
TOTAL PROTEIN: 6.7 g/dL (ref 6.5–8.1)

## 2018-04-23 LAB — MRSA PCR SCREENING: MRSA BY PCR: NEGATIVE

## 2018-04-23 LAB — CBC
HEMATOCRIT: 35.1 % — AB (ref 36.0–46.0)
HEMOGLOBIN: 11.3 g/dL — AB (ref 12.0–15.0)
MCH: 31.9 pg (ref 26.0–34.0)
MCHC: 32.2 g/dL (ref 30.0–36.0)
MCV: 99.2 fL (ref 80.0–100.0)
Platelets: 230 10*3/uL (ref 150–400)
RBC: 3.54 MIL/uL — ABNORMAL LOW (ref 3.87–5.11)
RDW: 13 % (ref 11.5–15.5)
WBC: 7.5 10*3/uL (ref 4.0–10.5)
nRBC: 0 % (ref 0.0–0.2)

## 2018-04-23 LAB — CBC WITH DIFFERENTIAL/PLATELET
Abs Immature Granulocytes: 0.01 10*3/uL (ref 0.00–0.07)
BASOS PCT: 1 %
Basophils Absolute: 0.1 10*3/uL (ref 0.0–0.1)
EOS ABS: 0.1 10*3/uL (ref 0.0–0.5)
EOS PCT: 2 %
HCT: 37 % (ref 36.0–46.0)
Hemoglobin: 11.5 g/dL — ABNORMAL LOW (ref 12.0–15.0)
Immature Granulocytes: 0 %
Lymphocytes Relative: 14 %
Lymphs Abs: 1.1 10*3/uL (ref 0.7–4.0)
MCH: 31.5 pg (ref 26.0–34.0)
MCHC: 31.1 g/dL (ref 30.0–36.0)
MCV: 101.4 fL — AB (ref 80.0–100.0)
MONO ABS: 0.6 10*3/uL (ref 0.1–1.0)
MONOS PCT: 7 %
NEUTROS PCT: 76 %
Neutro Abs: 6.1 10*3/uL (ref 1.7–7.7)
Platelets: 212 10*3/uL (ref 150–400)
RBC: 3.65 MIL/uL — ABNORMAL LOW (ref 3.87–5.11)
RDW: 13.1 % (ref 11.5–15.5)
WBC: 7.9 10*3/uL (ref 4.0–10.5)
nRBC: 0 % (ref 0.0–0.2)

## 2018-04-23 LAB — URINALYSIS, ROUTINE W REFLEX MICROSCOPIC
Bacteria, UA: NONE SEEN
Bilirubin Urine: NEGATIVE
Glucose, UA: NEGATIVE mg/dL
Hgb urine dipstick: NEGATIVE
KETONES UR: NEGATIVE mg/dL
Nitrite: NEGATIVE
Protein, ur: 30 mg/dL — AB
SPECIFIC GRAVITY, URINE: 1.016 (ref 1.005–1.030)
pH: 6 (ref 5.0–8.0)

## 2018-04-23 LAB — TROPONIN I
TROPONIN I: 0.34 ng/mL — AB (ref <0.03)
Troponin I: 0.71 ng/mL (ref <0.03)
Troponin I: 0.74 ng/mL (ref <0.03)

## 2018-04-23 LAB — CREATININE, SERUM
Creatinine, Ser: 0.77 mg/dL (ref 0.44–1.00)
GFR calc Af Amer: >60 mL/min (ref >60)

## 2018-04-23 LAB — PROTIME-INR
INR: 0.97
PROTHROMBIN TIME: 12.8 s (ref 11.4–15.2)

## 2018-04-23 LAB — APTT: aPTT: 33 seconds (ref 24–36)

## 2018-04-23 LAB — LACTIC ACID, PLASMA: LACTIC ACID, VENOUS: 1 mmol/L (ref 0.5–1.9)

## 2018-04-23 LAB — INFLUENZA PANEL BY PCR (TYPE A & B)
INFLAPCR: NEGATIVE
INFLBPCR: NEGATIVE

## 2018-04-23 LAB — HEPARIN LEVEL (UNFRACTIONATED): Heparin Unfractionated: 0.17 IU/mL — ABNORMAL LOW (ref 0.30–0.70)

## 2018-04-23 SURGERY — LEFT HEART CATH AND CORONARY ANGIOGRAPHY
Anesthesia: LOCAL

## 2018-04-23 MED ORDER — VERAPAMIL HCL 2.5 MG/ML IV SOLN
INTRAVENOUS | Status: AC
  Filled 2018-04-23: qty 2

## 2018-04-23 MED ORDER — PANTOPRAZOLE SODIUM 40 MG PO TBEC
40.0000 mg | DELAYED_RELEASE_TABLET | Freq: Every day | ORAL | Status: DC
  Administered 2018-04-23 – 2018-04-25 (×3): 40 mg via ORAL
  Filled 2018-04-23 (×3): qty 1

## 2018-04-23 MED ORDER — HEPARIN SODIUM (PORCINE) 5000 UNIT/ML IJ SOLN
5000.0000 [IU] | Freq: Three times a day (TID) | INTRAMUSCULAR | Status: DC
  Administered 2018-04-23 – 2018-04-25 (×5): 5000 [IU] via SUBCUTANEOUS
  Filled 2018-04-23 (×5): qty 1

## 2018-04-23 MED ORDER — SODIUM CHLORIDE 0.9 % IV SOLN: 250.0000 mL | INTRAVENOUS | Status: DC | PRN

## 2018-04-23 MED ORDER — ONDANSETRON HCL 4 MG/2ML IJ SOLN: 4.0000 mg | Freq: Four times a day (QID) | INTRAMUSCULAR | Status: DC | PRN

## 2018-04-23 MED ORDER — TROSPIUM CHLORIDE ER 60 MG PO CP24: 60.0000 mg | ORAL_CAPSULE | Freq: Every day | ORAL | Status: DC

## 2018-04-23 MED ORDER — HEPARIN SODIUM (PORCINE) 1000 UNIT/ML IJ SOLN
INTRAMUSCULAR | Status: DC | PRN
  Administered 2018-04-23: 3500 [IU] via INTRAVENOUS

## 2018-04-23 MED ORDER — HYDROXYZINE HCL 50 MG PO TABS
50.0000 mg | ORAL_TABLET | Freq: Four times a day (QID) | ORAL | Status: DC | PRN
  Filled 2018-04-23: qty 1

## 2018-04-23 MED ORDER — HEPARIN (PORCINE) IN NACL 1000-0.9 UT/500ML-% IV SOLN
INTRAVENOUS | Status: AC
  Filled 2018-04-23: qty 1000

## 2018-04-23 MED ORDER — SODIUM CHLORIDE 0.9% FLUSH: 3.0000 mL | INTRAVENOUS | Status: DC | PRN

## 2018-04-23 MED ORDER — HYDROCODONE-ACETAMINOPHEN 10-325 MG PO TABS
1.0000 | ORAL_TABLET | Freq: Four times a day (QID) | ORAL | Status: DC | PRN
  Administered 2018-04-23 – 2018-04-25 (×5): 1 via ORAL
  Filled 2018-04-23 (×5): qty 1

## 2018-04-23 MED ORDER — IOPAMIDOL (ISOVUE-370) INJECTION 76%
100.0000 mL | Freq: Once | INTRAVENOUS | Status: AC | PRN
  Administered 2018-04-23: 100 mL via INTRAVENOUS

## 2018-04-23 MED ORDER — ASPIRIN 81 MG PO CHEW
81.0000 mg | CHEWABLE_TABLET | Freq: Every day | ORAL | Status: DC
  Administered 2018-04-23 – 2018-04-25 (×3): 81 mg via ORAL
  Filled 2018-04-23 (×3): qty 1

## 2018-04-23 MED ORDER — HEPARIN (PORCINE) IN NACL 100-0.45 UNIT/ML-% IJ SOLN
900.0000 [IU]/h | INTRAMUSCULAR | Status: DC
  Administered 2018-04-23: 700 [IU]/h via INTRAVENOUS
  Filled 2018-04-23: qty 250

## 2018-04-23 MED ORDER — ALBUTEROL SULFATE HFA 108 (90 BASE) MCG/ACT IN AERS: 2.0000 | INHALATION_SPRAY | Freq: Once | RESPIRATORY_TRACT | Status: DC

## 2018-04-23 MED ORDER — ASPIRIN 81 MG PO CHEW: 81.0000 mg | CHEWABLE_TABLET | Freq: Every day | ORAL | Status: DC

## 2018-04-23 MED ORDER — ASPIRIN 81 MG PO CHEW
324.0000 mg | CHEWABLE_TABLET | Freq: Once | ORAL | Status: AC
  Administered 2018-04-23: 324 mg via ORAL
  Filled 2018-04-23: qty 4

## 2018-04-23 MED ORDER — HEPARIN (PORCINE) IN NACL 1000-0.9 UT/500ML-% IV SOLN
INTRAVENOUS | Status: DC | PRN
  Administered 2018-04-23: 500 mL

## 2018-04-23 MED ORDER — OXYCODONE HCL 5 MG PO TABS: 5.0000 mg | ORAL_TABLET | ORAL | Status: DC | PRN

## 2018-04-23 MED ORDER — CLOPIDOGREL BISULFATE 75 MG PO TABS
75.0000 mg | ORAL_TABLET | Freq: Every day | ORAL | Status: DC
  Administered 2018-04-24 – 2018-04-25 (×2): 75 mg via ORAL
  Filled 2018-04-23 (×2): qty 1

## 2018-04-23 MED ORDER — ACETAMINOPHEN 325 MG PO TABS: 650.0000 mg | ORAL_TABLET | ORAL | Status: DC | PRN

## 2018-04-23 MED ORDER — VERAPAMIL HCL 2.5 MG/ML IV SOLN
INTRAVENOUS | Status: DC | PRN
  Administered 2018-04-23: 10 mL via INTRA_ARTERIAL

## 2018-04-23 MED ORDER — PAROXETINE HCL 10 MG PO TABS
10.0000 mg | ORAL_TABLET | Freq: Every day | ORAL | Status: DC
  Administered 2018-04-23 – 2018-04-24 (×2): 10 mg via ORAL
  Filled 2018-04-23 (×2): qty 1

## 2018-04-23 MED ORDER — DARIFENACIN HYDROBROMIDE ER 7.5 MG PO TB24
7.5000 mg | ORAL_TABLET | Freq: Every day | ORAL | Status: DC
  Administered 2018-04-23 – 2018-04-25 (×3): 7.5 mg via ORAL
  Filled 2018-04-23 (×3): qty 1

## 2018-04-23 MED ORDER — MORPHINE SULFATE (PF) 2 MG/ML IV SOLN: 1.0000 mg | INTRAVENOUS | Status: DC | PRN

## 2018-04-23 MED ORDER — ATORVASTATIN CALCIUM 80 MG PO TABS
80.0000 mg | ORAL_TABLET | Freq: Every day | ORAL | Status: DC
  Administered 2018-04-23 – 2018-04-24 (×2): 80 mg via ORAL
  Filled 2018-04-23 (×2): qty 1

## 2018-04-23 MED ORDER — SODIUM CHLORIDE 0.9% FLUSH
3.0000 mL | Freq: Two times a day (BID) | INTRAVENOUS | Status: DC
  Administered 2018-04-24 (×2): 3 mL via INTRAVENOUS

## 2018-04-23 MED ORDER — METOPROLOL TARTRATE 25 MG PO TABS
25.0000 mg | ORAL_TABLET | Freq: Two times a day (BID) | ORAL | Status: DC
  Administered 2018-04-23 – 2018-04-24 (×2): 25 mg via ORAL
  Filled 2018-04-23 (×2): qty 1

## 2018-04-23 MED ORDER — LIDOCAINE HCL (PF) 1 % IJ SOLN
INTRAMUSCULAR | Status: AC
  Filled 2018-04-23: qty 30

## 2018-04-23 MED ORDER — METOPROLOL TARTRATE 12.5 MG HALF TABLET
12.5000 mg | ORAL_TABLET | Freq: Two times a day (BID) | ORAL | Status: DC
  Administered 2018-04-23: 12.5 mg via ORAL
  Filled 2018-04-23: qty 1

## 2018-04-23 MED ORDER — MIDAZOLAM HCL 2 MG/2ML IJ SOLN
INTRAMUSCULAR | Status: AC
  Filled 2018-04-23: qty 2

## 2018-04-23 MED ORDER — SODIUM CHLORIDE 0.9% FLUSH: 3.0000 mL | Freq: Two times a day (BID) | INTRAVENOUS | Status: DC

## 2018-04-23 MED ORDER — SODIUM CHLORIDE 0.9 % IV SOLN
INTRAVENOUS | Status: AC
  Administered 2018-04-23: 18:00:00 via INTRAVENOUS

## 2018-04-23 MED ORDER — NITROGLYCERIN 0.4 MG SL SUBL: 0.4000 mg | SUBLINGUAL_TABLET | SUBLINGUAL | Status: DC | PRN

## 2018-04-23 MED ORDER — ALBUTEROL SULFATE (2.5 MG/3ML) 0.083% IN NEBU: 2.5000 mg | INHALATION_SOLUTION | Freq: Four times a day (QID) | RESPIRATORY_TRACT | Status: DC | PRN

## 2018-04-23 MED ORDER — FENTANYL CITRATE (PF) 100 MCG/2ML IJ SOLN
INTRAMUSCULAR | Status: AC
  Filled 2018-04-23: qty 2

## 2018-04-23 MED ORDER — OFLOXACIN 0.3 % OP SOLN
2.0000 [drp] | Freq: Every day | OPHTHALMIC | Status: DC | PRN
  Filled 2018-04-23: qty 5

## 2018-04-23 MED ORDER — UMECLIDINIUM-VILANTEROL 62.5-25 MCG/INH IN AEPB
1.0000 | INHALATION_SPRAY | Freq: Every day | RESPIRATORY_TRACT | Status: DC
  Administered 2018-04-25: 1 via RESPIRATORY_TRACT
  Filled 2018-04-23: qty 14

## 2018-04-23 MED ORDER — LIDOCAINE HCL (PF) 1 % IJ SOLN
INTRAMUSCULAR | Status: DC | PRN
  Administered 2018-04-23: 2 mL

## 2018-04-23 MED ORDER — TRAMADOL HCL 50 MG PO TABS
50.0000 mg | ORAL_TABLET | Freq: Once | ORAL | Status: AC
  Administered 2018-04-23: 50 mg via ORAL
  Filled 2018-04-23: qty 1

## 2018-04-23 MED ORDER — PREDNISONE 20 MG PO TABS
40.0000 mg | ORAL_TABLET | Freq: Once | ORAL | Status: AC
  Administered 2018-04-23: 40 mg via ORAL
  Filled 2018-04-23: qty 2

## 2018-04-23 MED ORDER — HEPARIN BOLUS VIA INFUSION
1500.0000 [IU] | Freq: Once | INTRAVENOUS | Status: AC
  Administered 2018-04-23: 1500 [IU] via INTRAVENOUS
  Filled 2018-04-23: qty 1500

## 2018-04-23 MED ORDER — ACETAMINOPHEN 325 MG PO TABS
ORAL_TABLET | ORAL | Status: AC
  Administered 2018-04-23: 650 mg
  Filled 2018-04-23: qty 2

## 2018-04-23 MED ORDER — MIDAZOLAM HCL 2 MG/2ML IJ SOLN
INTRAMUSCULAR | Status: DC | PRN
  Administered 2018-04-23: 1 mg via INTRAVENOUS

## 2018-04-23 MED ORDER — HYDROCODONE-ACETAMINOPHEN 10-325 MG PO TABS: 1.0000 | ORAL_TABLET | Freq: Three times a day (TID) | ORAL | Status: DC

## 2018-04-23 MED ORDER — FENTANYL CITRATE (PF) 100 MCG/2ML IJ SOLN
INTRAMUSCULAR | Status: DC | PRN
  Administered 2018-04-23: 25 ug via INTRAVENOUS

## 2018-04-23 MED ORDER — SODIUM CHLORIDE 0.9 % IV SOLN: INTRAVENOUS | Status: DC

## 2018-04-23 MED ORDER — HEPARIN BOLUS VIA INFUSION
3000.0000 [IU] | Freq: Once | INTRAVENOUS | Status: AC
  Administered 2018-04-23: 3000 [IU] via INTRAVENOUS

## 2018-04-23 MED ORDER — UMECLIDINIUM-VILANTEROL 62.5-25 MCG/INH IN AEPB
1.0000 | INHALATION_SPRAY | Freq: Every day | RESPIRATORY_TRACT | Status: DC
  Filled 2018-04-23: qty 14

## 2018-04-23 MED ORDER — SODIUM CHLORIDE 0.9 % IV SOLN
INTRAVENOUS | Status: AC
  Administered 2018-04-23: 07:00:00 via INTRAVENOUS

## 2018-04-23 MED ORDER — CLOPIDOGREL BISULFATE 75 MG PO TABS
300.0000 mg | ORAL_TABLET | Freq: Once | ORAL | Status: AC
  Administered 2018-04-23: 300 mg via ORAL
  Filled 2018-04-23: qty 4

## 2018-04-23 MED ORDER — VENLAFAXINE HCL ER 75 MG PO CP24
75.0000 mg | ORAL_CAPSULE | Freq: Two times a day (BID) | ORAL | Status: DC
  Administered 2018-04-23 – 2018-04-25 (×5): 75 mg via ORAL
  Filled 2018-04-23 (×3): qty 1
  Filled 2018-04-23: qty 2
  Filled 2018-04-23: qty 1

## 2018-04-23 MED ORDER — ONDANSETRON HCL 4 MG PO TABS
4.0000 mg | ORAL_TABLET | Freq: Once | ORAL | Status: AC
  Administered 2018-04-23: 4 mg via ORAL
  Filled 2018-04-23: qty 1

## 2018-04-23 SURGICAL SUPPLY — 11 items
CATH INFINITI 5 FR JL3.5 (CATHETERS) ×1 IMPLANT
CATH INFINITI JR4 5F (CATHETERS) ×2 IMPLANT
DEVICE RAD COMP TR BAND LRG (VASCULAR PRODUCTS) ×1 IMPLANT
GLIDESHEATH SLEND A-KIT 6F 22G (SHEATH) ×1 IMPLANT
GUIDEWIRE INQWIRE 1.5J.035X260 (WIRE) ×1 IMPLANT
INQWIRE 1.5J .035X260CM (WIRE) ×2
KIT HEART LEFT (KITS) ×2 IMPLANT
PACK CARDIAC CATHETERIZATION (CUSTOM PROCEDURE TRAY) ×2 IMPLANT
SHEATH PROBE COVER 6X72 (BAG) ×2 IMPLANT
TRANSDUCER W/STOPCOCK (MISCELLANEOUS) ×2 IMPLANT
TUBING CIL FLEX 10 FLL-RA (TUBING) ×2 IMPLANT

## 2018-04-23 NOTE — ED Notes (Signed)
Patient assisted to restroom with standby assist. Patient now complaining of a headache with pain a 7 out of 10. RN Jerene Pitch made aware.

## 2018-04-23 NOTE — H&P (View-Only) (Signed)
Cardiology Consult    Patient ID: Connie Wood; [id number]; Apr 03, 1941   Admit date: 04/22/2018 Date of Consult: 04/23/2018  Primary Care Provider: Celene Squibb, MD Primary Cardiologist: Jenkins Rouge, MD   Patient Profile    Connie Wood is a 77 y.o. female with past medical history of HTN, HLD, and prior TIA who is being seen today for the evaluation of an NSTEMI at the request of Dr. Myna Hidalgo.   History of Present Illness    Connie Wood was last examined by Dr. Johnsie Cancel in 09/2016 denied any specific chest pain or dyspnea on exertion. She reported having been told she had carotid artery stenosis in the past, therefore a carotid duplex and AAA Screening US were obtained. No changes were made in her medication regimen at that time.   Patient presented to [facility] ED late last night for evaluation of fatigue and diaphoresis. Reports being in her usual state of health until approximately the day prior to admission when she developed significant diaphoresis with associated numbness and tingling along her fingertips. She denies any associated chest pain or dyspnea on exertion with this but says she could tell "something was off with her body". Denies any recent orthopnea, PND, lower extremity edema, palpitations, headaches, dizziness, or presyncope.  She has known HTN and HLD but denies any personal history of CAD. She is a former smoker but quit over 2 years ago. Denies any alcohol use or recreational drug use. Reports her mother had multiple cardiac issues.   Initial labs show WBC 7.9, Hgb 11.5, platelets 212, Na+ 137, K+ 3.6, creatinine 1.07. Influenza panel negative. Lactic Acid normal. Initial troponin elevated to 0.34 with repeat values trending upwards to 0.71 and 0.74. CXR with no acute cardiopulmonary processes. CT head with no acute intracranial abnormalities. CTA negative for PE. EKG shows NSR, HR 94, with no acute ST changes when compared to prior tracings.   Past  Medical History:  Diagnosis Date  . Back pain   . Cat scratch of right forearm 02/03/2014  . Hard of hearing   . Hemorrhoid 04/06/2014  . Hx of degenerative disc disease   . Hypercholesterolemia   . Hypertension   . Kidney stones   . Migraines   . Osteoarthritis   . Stroke [facility])    mini stroke, resolved in 30 minutes    Past Surgical History:  Procedure Laterality Date  . ABDOMINAL HYSTERECTOMY    . ANTERIOR AND POSTERIOR REPAIR    . APPENDECTOMY    . BILATERAL SALPINGOOPHORECTOMY    . BREAST SURGERY    . CHOLECYSTECTOMY    . colonoscopy  2003   Dr. Gala Romney: normal rectum, scattered diverticula  . COLONOSCOPY WITH PROPOFOL N/A 01/01/2016   Procedure: COLONOSCOPY WITH PROPOFOL;  Surgeon: Daneil Dolin, MD;  Location: [facility];  Service: Endoscopy;  Laterality: N/A;  1015  . ERCP with sphincterotomy  2003   Dr. Gala Romney: balloon dredging of bile duct, normal ampulla, choledocholithiasis   . EUS  2016   Dr. Amalia Hailey: subtle 13X17 mm heterogenous lesion in pancreas neck, FNA negative for malignancy   . POLYPECTOMY  01/01/2016   Procedure: POLYPECTOMY;  Surgeon: Daneil Dolin, MD;  Location: [facility] END[facility] SU[facility];  Service: Endoscopy;;   polypectomies x2-splenic flexure and cecal  . TONSILLECTOMY       Home Medications:  Prior to Admission medications   Medication Sig Start Date End Date Taking? Authorizing Provider  acetaminophen (TYLENOL) 500 MG tablet  Take 1,000 mg by mouth every 6 (six) hours as needed.    [provider]  aspirin 81 MG chewable tablet Chew 81 mg by mouth as needed.    [provider]  AZOR 10-40 MG per tablet Take 1 tablet by mouth daily. Reported on 12/04/2015 07/06/13   [provider]  Calcium Citrate (CITRACAL PO) Take 1 tablet by mouth every evening.     [provider]  Cholecalciferol (VITAMIN D PO) Take 1 tablet by mouth every evening.     [provider]  HYDROcodone-acetaminophen (NORCO) 10-325 MG tablet Take 1  tablet by mouth 3 (three) times daily.     [provider]  hydrOXYzine (ATARAX/VISTARIL) 25 MG tablet Take 50-100 mg by mouth every 4 (four) hours as needed for itching.    [provider]  ibuprofen (ADVIL,MOTRIN) 200 MG tablet Take 400 mg by mouth every 6 (six) hours as needed for headache or moderate pain.    [provider]  loperamide (IMODIUM) 2 MG capsule Take 4 mg by mouth as needed for diarrhea or loose stools.    [provider]  ofloxacin (OCUFLOX) 0.3 % ophthalmic solution Place 2 drops into the right ear daily as needed (for pain). 2 drops in right ear daily prn. 01/14/14   [provider]  omeprazole (PRILOSEC) 20 MG capsule Take 20 mg by mouth daily.    [provider]  psyllium (REGULOID) 0.52 g capsule Take 0.52 g by mouth daily. Takes 2 daily    [provider]  umeclidinium-vilanterol (ANORO ELLIPTA) 62.5-25 MCG/INH AEPB Inhale 1 puff into the lungs as needed.     [provider]  venlafaxine XR (EFFEXOR-XR) 75 MG 24 hr capsule Take 75 mg by mouth 2 (two) times daily. 02/02/16   [provider]    Inpatient Medications: Scheduled Meds: . aspirin  81 mg Oral Daily  . atorvastatin  80 mg Oral q1800  . metoprolol tartrate  12.5 mg Oral BID  . pantoprazole  40 mg Oral Daily  . umeclidinium-vilanterol  1 puff Inhalation Daily  . venlafaxine XR  75 mg Oral BID   Continuous Infusions: . sodium chloride 75 mL/hr at 04/23/18 0645  . heparin 700 Units/hr (04/23/18 0646)   PRN Meds: acetaminophen, albuterol, morphine injection, nitroGLYCERIN, ondansetron (ZOFRAN) IV  Allergies:    Allergies  Allergen Reactions  . Adhesive [Tape] Other (See Comments)    Tears skin  . Doxycycline Nausea And Vomiting  . Latex Itching and Rash    Social History:   Social History   Socioeconomic History  . Marital status: Widowed    Spouse name: Not on file  . Number of children: Not on file  . Years of  education: Not on file  . Highest education level: Not on file  Occupational History  . Not on file  Social Needs  . Financial resource strain: Not on file  . Food insecurity:    Worry: Not on file    Inability: Not on file  . Transportation needs:    Medical: Not on file    Non-medical: Not on file  Tobacco Use  . Smoking status: Former Smoker    Packs/day: 1.00    Years: 50.00    Pack years: 50.00    Types: Cigarettes  . Smokeless tobacco: Never Used  Substance and Sexual Activity  . Alcohol use: No    Alcohol/week: 0.0 standard drinks  . Drug use: No  . Sexual activity:  Never    Birth control/protection: Post-menopausal, Surgical    Comment: hyst  Lifestyle  . Physical activity:    Days per week: Not on file    Minutes per session: Not on file  . Stress: Not on file  Relationships  . Social connections:    Talks on phone: Not on file    Gets together: Not on file    Attends religious service: Not on file    Active member of club or organization: Not on file    Attends meetings of clubs or organizations: Not on file    Relationship status: Not on file  . Intimate partner violence:    Fear of current or ex partner: Not on file    Emotionally abused: Not on file    Physically abused: Not on file    Forced sexual activity: Not on file  Other Topics Concern  . Not on file  Social History Narrative  . Not on file     Family History:    Family History  Problem Relation Age of Onset  . Heart disease Mother   . Cancer Father        liver  . Cancer Son   . Heart disease Son   . Cancer Maternal Grandmother   . Colon cancer Neg Hx       Review of Systems    General:  No chills, fever, night sweats or weight changes.  Cardiovascular:  No chest pain, dyspnea on exertion, edema, orthopnea, palpitations, paroxysmal nocturnal dyspnea. Positive for diaphoresis.  Dermatological: No rash, lesions/masses Respiratory: No cough, dyspnea Urologic: No hematuria,  dysuria Abdominal:   No nausea, vomiting, diarrhea, bright red blood per rectum, melena, or hematemesis Neurologic:  No visual changes, wkns, changes in mental status.  All other systems reviewed and are otherwise negative except as noted above.  Physical Exam/Data    Vitals:   04/23/18 0915 04/23/18 0930 04/23/18 0939 04/23/18 0958  BP: (!) 150/95 (!) 149/94 (!) 149/94   Pulse: (!) 106 91    Resp: (!) 24 19    Temp:      TempSrc:      SpO2: (!) 88% 92%  94%  Weight:       No intake or output data in the 24 hours ending 04/23/18 1117 Filed Weights   04/23/18 0613  Weight: 64 kg   Body mass index is 25.79 kg/m.   General: Pleasant, Caucasian female appearing in NAD Psych: Normal affect. Neuro: Alert and oriented X 3. Moves all extremities spontaneously. HEENT: Normal  Neck: Supple without bruits or JVD. Lungs:  Resp regular and unlabored, CTA without wheezing or rales. Heart: RRR no s3, s4, or murmurs. Abdomen: Soft, non-tender, non-distended, BS + x 4.  Extremities: No clubbing, cyanosis or lower extremity edema. DP/PT/Radials 2+ and equal bilaterally.   Labs/Studies     Relevant CV Studies:  Carotid Dopplers: 09/2016 IMPRESSION: Mild bilateral common carotid, carotid bifurcation, proximal ICA sclerotic vascular disease. No flow limiting stenosis. Degree of stenosis less than 50% bilaterally. Similar findings noted on prior exam.  2. Vertebrals are patent with antegrade flow   Echocardiogram: 09/2016 Study Conclusions  - Left ventricle: The cavity size was normal. Severe asymmetric   septal hypertrophy. There is no SAM or dynamic gradient. Systolic   function was normal. The estimated ejection fraction was in the   range of 60% to 65%. Wall motion was normal; there were no   regional wall motion abnormalities. Doppler parameters are  consistent with abnormal left ventricular relaxation (grade 1   diastolic dysfunction). LA pressure is indeterminant. -  Aortic valve: Moderately calcified annulus. Trileaflet;   moderately thickened leaflets. Valve area (VTI): 1.82 cm^2. Valve   area (Vmax): 1.8 cm^2. Valve area (Vmean): 1.89 cm^2. - Pulmonary arteries: Systolic pressure was mildly to moderately   increased. PA peak pressure: 39 mm Hg (S). - Systemic veins: IVC is dilated with normal respiratory variation,   estimated RA pressure is 8 mmHg. - Technically adequate study.  Laboratory Data:  Chemistry Recent Labs  Lab 04/23/18 0044  NA 137  K 3.6  CL 106  CO2 23  GLUCOSE 122*  BUN 23  CREATININE 1.07*  CALCIUM 8.3*  GFRNONAA 49*  GFRAA 57*  ANIONGAP 8    Recent Labs  Lab 04/23/18 0044  PROT 6.7  ALBUMIN 3.9  AST 28  ALT 19  ALKPHOS 60  BILITOT 0.6   Hematology Recent Labs  Lab 04/23/18 0044  WBC 7.9  RBC 3.65*  HGB 11.5*  HCT 37.0  MCV 101.4*  MCH 31.5  MCHC 31.1  RDW 13.1  PLT 212   Cardiac Enzymes Recent Labs  Lab 04/23/18 0044 04/23/18 0326 04/23/18 0633  TROPONINI 0.34* 0.71* 0.74*   No results for input(s): TROPIPOC in the last 168 hours.  BNPNo results for input(s): BNP, PROBNP in the last 168 hours.  DDimer No results for input(s): DDIMER in the last 168 hours.  Radiology/Studies:  Dg Chest 2 View  Result Date: 04/23/2018 CLINICAL DATA:  77 year old female with cough. EXAM: CHEST - 2 VIEW COMPARISON:  Chest radiograph dated 08/27/2016 FINDINGS: Mild diffuse interstitial coarsening. No focal consolidation, pleural effusion, pneumothorax. The cardiac silhouette is within normal limits. The aorta is tortuous. There is dilated appearance of the ascending aorta on the lateral view which may be partly related to tortuosity and superimposition of the heart shadow. The aorta is however poorly evaluated the radiograph. There is degenerative changes of the spine. No acute osseous pathology. IMPRESSION: 1. No acute cardiopulmonary process. 2. Dilated appearance of the ascending aorta on the lateral view may  be partly related to tortuosity and superimposition of the heart shadow. Electronically Signed   By: Anner Crete M.D.   On: 04/23/2018 00:21   Ct Head Wo Contrast  Result Date: 04/23/2018 CLINICAL DATA:  Initial evaluation for acute difficulty with balance. EXAM: CT HEAD WITHOUT CONTRAST TECHNIQUE: Contiguous axial images were obtained from the base of the skull through the vertex without intravenous contrast. COMPARISON:  Prior CT from 12/21/2010. FINDINGS: Brain: Examination somewhat technically limited by patient positioning. Generalized age-related cerebral atrophy, mildly progressed relative to 2012. Scattered patchy hypodensity involving the supratentorial cerebral white matter, most like related chronic small vessel ischemic disease, also mildly progressed. No acute intracranial hemorrhage. No acute large vessel territory infarct. No mass lesion, midline shift or mass effect. No hydrocephalus. No extra-axial fluid collection. Vascular: No hyperdense vessel. Scattered vascular calcifications noted within the carotid siphons. Skull: Scalp soft tissues and calvarium within normal limits. Sinuses/Orbits: Globes and orbital soft tissues demonstrate no acute finding. Visualized paranasal sinuses and mastoid air cells are clear. Other: None. IMPRESSION: 1. No acute intracranial abnormality. 2. Generalized cerebral atrophy with chronic small vessel ischemic disease, mildly advanced relative to 2012. Electronically Signed   By: Jeannine Boga M.D.   On: 04/23/2018 03:53   Ct Angio Chest Pe W Or Wo Contrast  Result Date: 04/23/2018 CLINICAL DATA:  77 year old female with hypoxia. Concern for pulmonary  embolism. EXAM: CT ANGIOGRAPHY CHEST WITH CONTRAST TECHNIQUE: Multidetector CT imaging of the chest was performed using the standard protocol during bolus administration of intravenous contrast. Multiplanar CT image reconstructions and MIPs were obtained to evaluate the vascular anatomy. CONTRAST:   146mL ISOVUE-370 IOPAMIDOL (ISOVUE-370) INJECTION 76% COMPARISON:  Chest radiograph dated 04/22/2018 and CT dated 02/26/2016 FINDINGS: Cardiovascular: There is no cardiomegaly or pericardial effusion. Mild atherosclerotic calcification of the thoracic aorta. The visualized origins of the great vessels of the aortic arch appear patent. There is no CT evidence of pulmonary embolism. Mediastinum/Nodes: No hilar or mediastinal adenopathy. Esophagus is grossly unremarkable. No mediastinal fluid collection. Lungs/Pleura: Small ground-glass area in the left upper lobe (series 6, image 44 appears similar to prior CT. There is no focal consolidation, pleural effusion, or pneumothorax. Small areas of endobronchial nodularity (series 6, image 44 appears similar to prior CT, likely benign given interval stability. Bronchoscopy may provide better evaluation if clinically indicated. Upper Abdomen: Mild biliary ductal dilatation. Partially visualized left renal upper pole irregularity, likely related to underlying cysts. This is not characterized. Ultrasound may provide better evaluation. Musculoskeletal: Degenerative changes of the spine. Review of the MIP images confirms the above findings. IMPRESSION: No acute intrathoracic pathology. No CT evidence of pulmonary embolism. Electronically Signed   By: Anner Crete M.D.   On: 04/23/2018 05:57     Assessment & Plan    1. NSTEMI - presented with episodes of diaphoresis and associated numbness and tingling down her upper extremities. Reports feeling fatigued over the past few days but denies any chest pain or dyspnea.  -  Initial troponin elevated to 0.34 with repeat values trending upwards to 0.71 and 0.74, consistent with NSTEMI. CT Head with no acute intracranial abnormalities. CTA negative for PE. EKG shows NSR, HR 94, with no acute ST changes when compared to prior tracings.  - she has been started on Heparin. ASA, BB, and high-intensity statin therapy have been  initiated. Reviewed cardiac catheterization with the patient and she is in agreement to proceed. The patient understands that risks include but are not limited to stroke (1 in 1000), death (1 in 9), kidney failure [usually temporary] (1 in 500), bleeding (1 in 200), allergic reaction [possibly serious] (1 in 200). Scheduled for later today.   2. HTN - recorded BP variable while in the ED as 74/38 - 163/106 since admission. At 149/94 on recheck. PTA Azor held. Started on Lopressor 12.5mg  BID.   3. HLD - recheck FLP. She has been started on Atorvastatin 80mg  daily.   4. Renal Insufficiency - creatinine 1.07 on admission. Received contrast dye with CTA on admission and scheduled for cath today. PTA Azor held. Has been started on IVF. Repeat BMET in AM.    For questions or updates, please contact [place] Please consult [email or web address] for contact info under Cardiology/STEMI.  Signed, Erma Heritage, PA-C 04/23/2018, 11:17 AM Pager: [phone]   Attending note Patient seen and discussed with PA Ahmed Prima, I agree with her documentation above. 77 yo female HTN, HLD, and prior TIA presents with chest pain and NSTEMI. She has been started on appropriate medical therapy and will be transferred to [facility] for cardiac catheterization. Currently pain free and hemodynamically stable   Carlyle Dolly MD

## 2018-04-23 NOTE — ED Provider Notes (Signed)
Patient presents with some episodes that started the evening of the 29th.  She states they are "awful".  She states she feels like she is burning up and gets diaphoretic.  She feels dizzy and sometimes feels like she is spinning or off balance.  She states her fingertips feel "strange" which means painful.  She denies headache, chest pain, nausea or vomiting but she states she does feel short of breath during these episodes.  They last about 20 to 30 minutes.  She had an episode in front of her daughter tonight and daughter states during the episode patient stated she had no control over her body and when she walked to the car to come to the ED she seemed off balance more than usual and they thought she might fall.  Patient has a history of stroke before and at that time was dropping things and she could not write well and daughter states she was "talking crazy stuff".  Patient is awake and alert, she does not appear to have any focal neuro deficit.  While I am in the room talking to her her pulse ox is 80% however her color is good and she does not appear to be short of breath.  After reviewing her chest x-ray and her borderline hypoxia and the fact that she was a little hypotensive on arrival I was considering doing a CTA but could not decide whether to do CTA to check for PE or CTA to look for dissection.  She has chronic back pain from some scoliosis.  I will let Dr. Myna Hidalgo decide.    Medical screening examination/treatment/procedure(s) were conducted as a shared visit with non-physician practitioner(s) and myself.  I personally evaluated the patient during the encounter.  EKG Interpretation  Date/Time:  Thursday April 23 2018 01:31:42 EDT Ventricular Rate:  94 PR Interval:    QRS Duration: 100 QT Interval:  383 QTC Calculation: 479 R Axis:   73 Text Interpretation:  Sinus rhythm Normal ECG No significant change since last tracing 04 Mar 2016 Confirmed by Rolland Porter [phone]) on 04/23/2018 2:09:57  AM   Patient's initial troponin was positive where it has been negative in the past.  A delta troponin was done.  I also added a head CT to look for an acute stroke.  Patient's delta troponin did show a increasing elevation of her troponin.  I will talk to cardiology.   4:42 AM patient was discussed with the cardiologist on-call, Dr. Elson Areas, he recommends admission to medicine and consult cardiology in the morning.  5:05 AM Dr. Myna Hidalgo, hospitalist will admit for further evaluation.  Plan admission  Rolland Porter, MD, Barbette Or, MD 04/23/18 [phone]

## 2018-04-23 NOTE — Progress Notes (Signed)
[place] for Heparin Indication: ACS/Stemi  Allergies  Allergen Reactions  . Adhesive [Tape] Other (See Comments)    Tears skin  . Doxycycline Nausea And Vomiting  . Latex Itching and Rash    Patient Measurements: Weight: 136 lb 14.5 oz (62.1 kg)  HDW: 62.1   Vital Signs: Temp: 97.5 F (36.4 C) (10/31 1124) Temp Source: Oral (10/31 1124) BP: 144/86 (10/31 1124) Pulse Rate: 77 (10/31 1124)  Labs: Recent Labs    04/23/18 0044 04/23/18 0326 04/23/18 0633 04/23/18 1408  HGB 11.5*  --   --   --   HCT 37.0  --   --   --   PLT 212  --   --   --   APTT  --   --  33  --   LABPROT  --   --  12.8  --   INR  --   --  0.97  --   HEPARINUNFRC  --   --   --  0.17*  CREATININE 1.07*  --   --   --   TROPONINI 0.34* 0.71* 0.74*  --    Estimated Creatinine Clearance: 38.2 mL/min (A) (by C-G formula based on SCr of 1.07 mg/dL (H)).  Medical History: Past Medical History:  Diagnosis Date  . Back pain   . Cat scratch of right forearm 02/03/2014  . Hard of hearing   . Hemorrhoid 04/06/2014  . History of kidney stones   . Hx of degenerative disc disease   . Hypercholesterolemia   . Hypertension   . Migraines   . Osteoarthritis   . Stroke [facility])    mini stroke, resolved in 30 minutes    Assessment: 77 yo female presenting with SOB, fatigue, malaise, and diaphoresis since 10/29, elevated troponin. Pharmacy has been consulted for heparin for NSTEMI. Not on anticoagulation PTA. CT head negative. CTA negative for PE. Hg 11.5, plt wnl.  Heparin level low at 0.17. Planning for cath and transferred to [facility]. No bleeding or issues with infusion per discussion with RN.  Goal of Therapy:  Heparin level goal: 0.3-0.7 units/ml Monitor platelets by anticoagulation protocol   Plan:  Heparin 1500 unit bolus Increase heparin to 900 units/hr 8h heparin level Monitor daily heparin level and CBC, s/sx bleeding F/u plans for cath  Elicia Lamp, PharmD,  BCPS Please check AMION for all [facility] contact numbers Clinical Pharmacist 04/23/2018 3:01 PM

## 2018-04-23 NOTE — ED Notes (Addendum)
Date and time results received: 04/23/18 7:02 AM  (use smartphrase ".now" to insert current time)  Test: Troponin Critical Value: 0.74  Name of Provider Notified: [place]  Orders Received? Or Actions Taken?: Orders Received - See Orders for details

## 2018-04-23 NOTE — ED Notes (Signed)
CRITICAL VALUE ALERT  Critical Value:  Trop. 0.34  Date & Time Notied:  04/23/18 0123  Provider Notified: Lily Kocher, PA  Orders Received/Actions taken: See chart

## 2018-04-23 NOTE — Consult Note (Addendum)
Cardiology Consult    Patient ID: Connie Wood; [id number]; 07-Jul-1940   Admit date: 04/22/2018 Date of Consult: 04/23/2018  Primary Care Provider: Celene Squibb, MD Primary Cardiologist: Jenkins Rouge, MD   Patient Profile    Connie Wood is a 77 y.o. female with past medical history of HTN, HLD, and prior TIA who is being seen today for the evaluation of an NSTEMI at the request of Dr. Myna Hidalgo.   History of Present Illness    [name] was last examined by Dr. Johnsie Cancel in 09/2016 denied any specific chest pain or dyspnea on exertion. She reported having been told she had carotid artery stenosis in the past, therefore a carotid duplex and AAA Screening US were obtained. No changes were made in her medication regimen at that time.   Patient presented to [facility] ED late last night for evaluation of fatigue and diaphoresis. Reports being in her usual state of health until approximately the day prior to admission when she developed significant diaphoresis with associated numbness and tingling along her fingertips. She denies any associated chest pain or dyspnea on exertion with this but says she could tell "something was off with her body". Denies any recent orthopnea, PND, lower extremity edema, palpitations, headaches, dizziness, or presyncope.  She has known HTN and HLD but denies any personal history of CAD. She is a former smoker but quit over 2 years ago. Denies any alcohol use or recreational drug use. Reports her mother had multiple cardiac issues.   Initial labs show WBC 7.9, Hgb 11.5, platelets 212, Na+ 137, K+ 3.6, creatinine 1.07. Influenza panel negative. Lactic Acid normal. Initial troponin elevated to 0.34 with repeat values trending upwards to 0.71 and 0.74. CXR with no acute cardiopulmonary processes. CT head with no acute intracranial abnormalities. CTA negative for PE. EKG shows NSR, HR 94, with no acute ST changes when compared to prior tracings.   Past  Medical History:  Diagnosis Date  . Back pain   . Cat scratch of right forearm 02/03/2014  . Hard of hearing   . Hemorrhoid 04/06/2014  . Hx of degenerative disc disease   . Hypercholesterolemia   . Hypertension   . Kidney stones   . Migraines   . Osteoarthritis   . Stroke [facility])    mini stroke, resolved in 30 minutes    Past Surgical History:  Procedure Laterality Date  . ABDOMINAL HYSTERECTOMY    . ANTERIOR AND POSTERIOR REPAIR    . APPENDECTOMY    . BILATERAL SALPINGOOPHORECTOMY    . BREAST SURGERY    . CHOLECYSTECTOMY    . colonoscopy  2003   Dr. Gala Romney: normal rectum, scattered diverticula  . COLONOSCOPY WITH PROPOFOL N/A 01/01/2016   Procedure: COLONOSCOPY WITH PROPOFOL;  Surgeon: Daneil Dolin, MD;  Location: [facility];  Service: Endoscopy;  Laterality: N/A;  1015  . ERCP with sphincterotomy  2003   Dr. Gala Romney: balloon dredging of bile duct, normal ampulla, choledocholithiasis   . EUS  2016   Dr. Amalia Hailey: subtle 13X17 mm heterogenous lesion in pancreas neck, FNA negative for malignancy   . POLYPECTOMY  01/01/2016   Procedure: POLYPECTOMY;  Surgeon: Daneil Dolin, MD;  Location: [facility];  Service: Endoscopy;;   polypectomies x2-splenic flexure and cecal  . TONSILLECTOMY       Home Medications:  Prior to Admission medications   Medication Sig Start Date End Date Taking? Authorizing Provider  acetaminophen (TYLENOL) 500 MG tablet  Take 1,000 mg by mouth every 6 (six) hours as needed.    [provider]  aspirin 81 MG chewable tablet Chew 81 mg by mouth as needed.    [provider]  AZOR 10-40 MG per tablet Take 1 tablet by mouth daily. Reported on 12/04/2015 07/06/13   [provider]  Calcium Citrate (CITRACAL PO) Take 1 tablet by mouth every evening.     [provider]  Cholecalciferol (VITAMIN D PO) Take 1 tablet by mouth every evening.     [provider]  HYDROcodone-acetaminophen (NORCO) 10-325 MG tablet Take 1  tablet by mouth 3 (three) times daily.     [provider]  hydrOXYzine (ATARAX/VISTARIL) 25 MG tablet Take 50-100 mg by mouth every 4 (four) hours as needed for itching.    [provider]  ibuprofen (ADVIL,MOTRIN) 200 MG tablet Take 400 mg by mouth every 6 (six) hours as needed for headache or moderate pain.    [provider]  loperamide (IMODIUM) 2 MG capsule Take 4 mg by mouth as needed for diarrhea or loose stools.    [provider]  ofloxacin (OCUFLOX) 0.3 % ophthalmic solution Place 2 drops into the right ear daily as needed (for pain). 2 drops in right ear daily prn. 01/14/14   [provider]  omeprazole (PRILOSEC) 20 MG capsule Take 20 mg by mouth daily.    [provider]  psyllium (REGULOID) 0.52 g capsule Take 0.52 g by mouth daily. Takes 2 daily    [provider]  umeclidinium-vilanterol (ANORO ELLIPTA) 62.5-25 MCG/INH AEPB Inhale 1 puff into the lungs as needed.     [provider]  venlafaxine XR (EFFEXOR-XR) 75 MG 24 hr capsule Take 75 mg by mouth 2 (two) times daily. 02/02/16   [provider]    Inpatient Medications: Scheduled Meds: . aspirin  81 mg Oral Daily  . atorvastatin  80 mg Oral q1800  . metoprolol tartrate  12.5 mg Oral BID  . pantoprazole  40 mg Oral Daily  . umeclidinium-vilanterol  1 puff Inhalation Daily  . venlafaxine XR  75 mg Oral BID   Continuous Infusions: . sodium chloride 75 mL/hr at 04/23/18 0645  . heparin 700 Units/hr (04/23/18 0646)   PRN Meds: acetaminophen, albuterol, morphine injection, nitroGLYCERIN, ondansetron (ZOFRAN) IV  Allergies:    Allergies  Allergen Reactions  . Adhesive [Tape] Other (See Comments)    Tears skin  . Doxycycline Nausea And Vomiting  . Latex Itching and Rash    Social History:   Social History   Socioeconomic History  . Marital status: Widowed    Spouse name: Not on file  . Number of children: Not on file  . Years of  education: Not on file  . Highest education level: Not on file  Occupational History  . Not on file  Social Needs  . Financial resource strain: Not on file  . Food insecurity:    Worry: Not on file    Inability: Not on file  . Transportation needs:    Medical: Not on file    Non-medical: Not on file  Tobacco Use  . Smoking status: Former Smoker    Packs/day: 1.00    Years: 50.00    Pack years: 50.00    Types: Cigarettes  . Smokeless tobacco: Never Used  Substance and Sexual Activity  . Alcohol use: No    Alcohol/week: 0.0 standard drinks  . Drug use: No  . Sexual activity:  Never    Birth control/protection: Post-menopausal, Surgical    Comment: hyst  Lifestyle  . Physical activity:    Days per week: Not on file    Minutes per session: Not on file  . Stress: Not on file  Relationships  . Social connections:    Talks on phone: Not on file    Gets together: Not on file    Attends religious service: Not on file    Active member of club or organization: Not on file    Attends meetings of clubs or organizations: Not on file    Relationship status: Not on file  . Intimate partner violence:    Fear of current or ex partner: Not on file    Emotionally abused: Not on file    Physically abused: Not on file    Forced sexual activity: Not on file  Other Topics Concern  . Not on file  Social History Narrative  . Not on file     Family History:    Family History  Problem Relation Age of Onset  . Heart disease Mother   . Cancer Father        liver  . Cancer Son   . Heart disease Son   . Cancer Maternal Grandmother   . Colon cancer Neg Hx       Review of Systems    General:  No chills, fever, night sweats or weight changes.  Cardiovascular:  No chest pain, dyspnea on exertion, edema, orthopnea, palpitations, paroxysmal nocturnal dyspnea. Positive for diaphoresis.  Dermatological: No rash, lesions/masses Respiratory: No cough, dyspnea Urologic: No hematuria,  dysuria Abdominal:   No nausea, vomiting, diarrhea, bright red blood per rectum, melena, or hematemesis Neurologic:  No visual changes, wkns, changes in mental status.  All other systems reviewed and are otherwise negative except as noted above.  Physical Exam/Data    Vitals:   04/23/18 0915 04/23/18 0930 04/23/18 0939 04/23/18 0958  BP: (!) 150/95 (!) 149/94 (!) 149/94   Pulse: (!) 106 91    Resp: (!) 24 19    Temp:      TempSrc:      SpO2: (!) 88% 92%  94%  Weight:       No intake or output data in the 24 hours ending 04/23/18 1117 Filed Weights   04/23/18 0613  Weight: 64 kg   Body mass index is 25.79 kg/m.   General: Pleasant, Caucasian female appearing in NAD Psych: Normal affect. Neuro: Alert and oriented X 3. Moves all extremities spontaneously. HEENT: Normal  Neck: Supple without bruits or JVD. Lungs:  Resp regular and unlabored, CTA without wheezing or rales. Heart: RRR no s3, s4, or murmurs. Abdomen: Soft, non-tender, non-distended, BS + x 4.  Extremities: No clubbing, cyanosis or lower extremity edema. DP/PT/Radials 2+ and equal bilaterally.   Labs/Studies     Relevant CV Studies:  Carotid Dopplers: 09/2016 IMPRESSION: Mild bilateral common carotid, carotid bifurcation, proximal ICA sclerotic vascular disease. No flow limiting stenosis. Degree of stenosis less than 50% bilaterally. Similar findings noted on prior exam.  2. Vertebrals are patent with antegrade flow   Echocardiogram: 09/2016 Study Conclusions  - Left ventricle: The cavity size was normal. Severe asymmetric   septal hypertrophy. There is no SAM or dynamic gradient. Systolic   function was normal. The estimated ejection fraction was in the   range of 60% to 65%. Wall motion was normal; there were no   regional wall motion abnormalities. Doppler parameters are  consistent with abnormal left ventricular relaxation (grade 1   diastolic dysfunction). LA pressure is indeterminant. -  Aortic valve: Moderately calcified annulus. Trileaflet;   moderately thickened leaflets. Valve area (VTI): 1.82 cm^2. Valve   area (Vmax): 1.8 cm^2. Valve area (Vmean): 1.89 cm^2. - Pulmonary arteries: Systolic pressure was mildly to moderately   increased. PA peak pressure: 39 mm Hg (S). - Systemic veins: IVC is dilated with normal respiratory variation,   estimated RA pressure is 8 mmHg. - Technically adequate study.  Laboratory Data:  Chemistry Recent Labs  Lab 04/23/18 0044  NA 137  K 3.6  CL 106  CO2 23  GLUCOSE 122*  BUN 23  CREATININE 1.07*  CALCIUM 8.3*  GFRNONAA 49*  GFRAA 57*  ANIONGAP 8    Recent Labs  Lab 04/23/18 0044  PROT 6.7  ALBUMIN 3.9  AST 28  ALT 19  ALKPHOS 60  BILITOT 0.6   Hematology Recent Labs  Lab 04/23/18 0044  WBC 7.9  RBC 3.65*  HGB 11.5*  HCT 37.0  MCV 101.4*  MCH 31.5  MCHC 31.1  RDW 13.1  PLT 212   Cardiac Enzymes Recent Labs  Lab 04/23/18 0044 04/23/18 0326 04/23/18 0633  TROPONINI 0.34* 0.71* 0.74*   No results for input(s): TROPIPOC in the last 168 hours.  BNPNo results for input(s): BNP, PROBNP in the last 168 hours.  DDimer No results for input(s): DDIMER in the last 168 hours.  Radiology/Studies:  Dg Chest 2 View  Result Date: 04/23/2018 CLINICAL DATA:  77 year old female with cough. EXAM: CHEST - 2 VIEW COMPARISON:  Chest radiograph dated 08/27/2016 FINDINGS: Mild diffuse interstitial coarsening. No focal consolidation, pleural effusion, pneumothorax. The cardiac silhouette is within normal limits. The aorta is tortuous. There is dilated appearance of the ascending aorta on the lateral view which may be partly related to tortuosity and superimposition of the heart shadow. The aorta is however poorly evaluated the radiograph. There is degenerative changes of the spine. No acute osseous pathology. IMPRESSION: 1. No acute cardiopulmonary process. 2. Dilated appearance of the ascending aorta on the lateral view may  be partly related to tortuosity and superimposition of the heart shadow. Electronically Signed   By: Anner Crete M.D.   On: 04/23/2018 00:21   Ct Head Wo Contrast  Result Date: 04/23/2018 CLINICAL DATA:  Initial evaluation for acute difficulty with balance. EXAM: CT HEAD WITHOUT CONTRAST TECHNIQUE: Contiguous axial images were obtained from the base of the skull through the vertex without intravenous contrast. COMPARISON:  Prior CT from 12/21/2010. FINDINGS: Brain: Examination somewhat technically limited by patient positioning. Generalized age-related cerebral atrophy, mildly progressed relative to 2012. Scattered patchy hypodensity involving the supratentorial cerebral white matter, most like related chronic small vessel ischemic disease, also mildly progressed. No acute intracranial hemorrhage. No acute large vessel territory infarct. No mass lesion, midline shift or mass effect. No hydrocephalus. No extra-axial fluid collection. Vascular: No hyperdense vessel. Scattered vascular calcifications noted within the carotid siphons. Skull: Scalp soft tissues and calvarium within normal limits. Sinuses/Orbits: Globes and orbital soft tissues demonstrate no acute finding. Visualized paranasal sinuses and mastoid air cells are clear. Other: None. IMPRESSION: 1. No acute intracranial abnormality. 2. Generalized cerebral atrophy with chronic small vessel ischemic disease, mildly advanced relative to 2012. Electronically Signed   By: Jeannine Boga M.D.   On: 04/23/2018 03:53   Ct Angio Chest Pe W Or Wo Contrast  Result Date: 04/23/2018 CLINICAL DATA:  77 year old female with hypoxia. Concern for pulmonary  embolism. EXAM: CT ANGIOGRAPHY CHEST WITH CONTRAST TECHNIQUE: Multidetector CT imaging of the chest was performed using the standard protocol during bolus administration of intravenous contrast. Multiplanar CT image reconstructions and MIPs were obtained to evaluate the vascular anatomy. CONTRAST:   179mL ISOVUE-370 IOPAMIDOL (ISOVUE-370) INJECTION 76% COMPARISON:  Chest radiograph dated 04/22/2018 and CT dated 02/26/2016 FINDINGS: Cardiovascular: There is no cardiomegaly or pericardial effusion. Mild atherosclerotic calcification of the thoracic aorta. The visualized origins of the great vessels of the aortic arch appear patent. There is no CT evidence of pulmonary embolism. Mediastinum/Nodes: No hilar or mediastinal adenopathy. Esophagus is grossly unremarkable. No mediastinal fluid collection. Lungs/Pleura: Small ground-glass area in the left upper lobe (series 6, image 44 appears similar to prior CT. There is no focal consolidation, pleural effusion, or pneumothorax. Small areas of endobronchial nodularity (series 6, image 44 appears similar to prior CT, likely benign given interval stability. Bronchoscopy may provide better evaluation if clinically indicated. Upper Abdomen: Mild biliary ductal dilatation. Partially visualized left renal upper pole irregularity, likely related to underlying cysts. This is not characterized. Ultrasound may provide better evaluation. Musculoskeletal: Degenerative changes of the spine. Review of the MIP images confirms the above findings. IMPRESSION: No acute intrathoracic pathology. No CT evidence of pulmonary embolism. Electronically Signed   By: Anner Crete M.D.   On: 04/23/2018 05:57     Assessment & Plan    1. NSTEMI - presented with episodes of diaphoresis and associated numbness and tingling down her upper extremities. Reports feeling fatigued over the past few days but denies any chest pain or dyspnea.  -  Initial troponin elevated to 0.34 with repeat values trending upwards to 0.71 and 0.74, consistent with NSTEMI. CT Head with no acute intracranial abnormalities. CTA negative for PE. EKG shows NSR, HR 94, with no acute ST changes when compared to prior tracings.  - she has been started on Heparin. ASA, BB, and high-intensity statin therapy have been  initiated. Reviewed cardiac catheterization with the patient and she is in agreement to proceed. The patient understands that risks include but are not limited to stroke (1 in 1000), death (1 in 29), kidney failure [usually temporary] (1 in 500), bleeding (1 in 200), allergic reaction [possibly serious] (1 in 200). Scheduled for later today.   2. HTN - recorded BP variable while in the ED as 74/38 - 163/106 since admission. At 149/94 on recheck. PTA Azor held. Started on Lopressor 12.5mg  BID.   3. HLD - recheck FLP. She has been started on Atorvastatin 80mg  daily.   4. Renal Insufficiency - creatinine 1.07 on admission. Received contrast dye with CTA on admission and scheduled for cath today. PTA Azor held. Has been started on IVF. Repeat BMET in AM.    For questions or updates, please contact [place] Please consult [email or web address] for contact info under Cardiology/STEMI.  Signed, Erma Heritage, PA-C 04/23/2018, 11:17 AM Pager: [phone]   Attending note Patient seen and discussed with PA Ahmed Prima, I agree with her documentation above. 77 yo female HTN, HLD, and prior TIA presents with chest pain and NSTEMI. She has been started on appropriate medical therapy and will be transferred to [facility] for cardiac catheterization. Currently pain free and hemodynamically stable   Carlyle Dolly MD

## 2018-04-23 NOTE — Progress Notes (Signed)
Patient seen and examined. Admitted after midnight secondary to SOB, diaphoresis and vague chest discomfort. Heart score of 4 calculated. Patient without active CP currently after NTG and morphine in ED. Troponin 0.71>>0.74; case discussed with cardiology who recommended transfer to [facility] for further evaluation and possible heart cath in setting of NSTEMI with most likely angina equivalent as far as her SOB/diaphoresis symptoms.  VS are stable currently, patient is CP free and has been started on heparin drip. Will continue ASA, low dose metoprolol and statins. Will use PRN NTG and morphine. Checking lipid panel, cycling enzymes and will follow Echo.  Please refer to H&P written by Dr. Myna Hidalgo for further info/details on admission; consult cardiology once patient arrived to [facility] for formal consultation.  Barton Dubois MD [phone]

## 2018-04-23 NOTE — Interval H&P Note (Signed)
Cath Lab Visit (complete for each Cath Lab visit)  Clinical Evaluation Leading to the Procedure:   ACS: Yes.    Non-ACS:    Anginal Classification: CCS III  Anti-ischemic medical therapy: Maximal Therapy (2 or more classes of medications)  Non-Invasive Test Results: No non-invasive testing performed  Prior CABG: No previous CABG      History and Physical Interval Note:  04/23/2018 4:26 PM  Connie Wood  has presented today for surgery, with the diagnosis of ns  The various methods of treatment have been discussed with the patient and family. After consideration of risks, benefits and other options for treatment, the patient has consented to  Procedure(s): LEFT HEART CATH AND CORONARY ANGIOGRAPHY (N/A) as a surgical intervention .  The patient's history has been reviewed, patient examined, no change in status, stable for surgery.  I have reviewed the patient's chart and labs.  Questions were answered to the patient's satisfaction.     Belva Crome III

## 2018-04-23 NOTE — Progress Notes (Signed)
   Follow Up Note  HPI: Patient is a 77 year old female with past medical history of hypertension and anxiety plus obesity who presented to the emergency room on the night of 10/30 with complaints of recurrent shortness of breath, fatigue and generalized aches and pains which have been going on since yesterday.  In the emergency room at [facility] in [place], patient was noted to be afebrile, but tachypneic, tachycardic with initial blood pressure 74/38 and some subtle ST abnormalities.  Chest x-ray unrevealing.  Initial troponin elevated at 0.34 and subsequent troponin at 0.71.  Patient given IV fluids and oxygen.  Brought in for further observation.  Following arrival, seen by cardiology, who felt that patient was having a non-STEMI and recommended transfer to [facility].  Upon transfer to [facility], patient is being planned for cardiac catheterization. Pt admitted earlier this morning.  Seen after arrived to floor.      Principal Problem:   NSTEMI (non-ST elevated myocardial infarction) [facility]): On heparin and nitroglycerin.  For cardiac cath. Active Problems:   Chronic bronchitis ([place])   Hypertension: Blood pressure stable Acute respiratory failure with hypoxia: Unclear etiology.  Episodic.  May be undiagnosed sleep apnea?.  Currently 95% on room air. Hypotension: Resolved with IV fluids.   AKI (acute kidney injury) ([place]): Mild.  Watch given contrast.  Received hydration.   Obesity (BMI 30-39.9): Patient meets criteria with BMI greater than 30 Chronic pain: Continue home medication  Disposition: Discharge in next few days depending on cardiac cath findings.

## 2018-04-23 NOTE — H&P (Signed)
History and Physical    Hidaya Daniel Fontanez EXB:[id number] DOB: 1941/04/18 DOA: 04/22/2018  PCP: Celene Squibb, MD   Patient coming from: Home   Chief Complaint: SOB, malaise, diaphoresis  HPI: MERNA BALDI is a 77 y.o. female with medical history significant for hypertension, chronic bronchitis, and anxiety, now presenting to the emergency department for evaluation of recurrent episodes of shortness of breath, fatigue, general aches and malaise, and diaphoresis.  The patient reports that she had been in her usual state of health until the evening of 04/21/2018 when she developed acute onset of shortness of breath with diaphoresis and general aches and malaise.  She had several similar episodes yesterday, lasting 20 to 30 minutes each, and resolving with rest.  She reports feeling as though she was going to die during 1 of these episodes and called her daughter.  Her daughter came over and witnessed 1 of the episodes, reports that the patient was sweaty and holding her chest.  She appeared to be short of breath at the time.  Patient denies any fevers, chills, cough, or swelling or tenderness in the lower extremities.  She had never experienced this previously.  She denies any nausea, vomiting, or diarrhea.  ED Course: Upon arrival to the ED, patient is found to be afebrile, saturating mid 80s on room air, slightly tachypneic, slightly tachycardic, and with initial blood pressure 74/38.  EKG features a sinus rhythm with subtle ST abnormality that is similar to prior.  Chest x-ray is negative for any definite acute cardiopulmonary disease.  Noncontrast head CT is negative for acute findings.  Chemistry panel is notable for a slight renal insufficiency and CBC is notable for mild macrocytic anemia.  Initial troponin is elevated to 0.34 and increased to 0.71 three hours later.  Patient was treated with 40 mg prednisone, albuterol, and Zofran in the ED.  Patient will be admitted for ongoing  evaluation and management.  Review of Systems:  All other systems reviewed and apart from HPI, are negative.  Past Medical History:  Diagnosis Date  . Back pain   . Cat scratch of right forearm 02/03/2014  . Hard of hearing   . Hemorrhoid 04/06/2014  . Hx of degenerative disc disease   . Hypercholesteremia 02/03/2014  . Hypercholesterolemia   . Hypertension   . Kidney stones   . Migraines   . Osteoarthritis   . Stroke [facility])    mini stroke, resolved in 30 minutes    Past Surgical History:  Procedure Laterality Date  . ABDOMINAL HYSTERECTOMY    . ANTERIOR AND POSTERIOR REPAIR    . APPENDECTOMY    . BILATERAL SALPINGOOPHORECTOMY    . BREAST SURGERY    . CHOLECYSTECTOMY    . colonoscopy  2003   Dr. Gala Romney: normal rectum, scattered diverticula  . COLONOSCOPY WITH PROPOFOL N/A 01/01/2016   Procedure: COLONOSCOPY WITH PROPOFOL;  Surgeon: Daneil Dolin, MD;  Location: [facility];  Service: Endoscopy;  Laterality: N/A;  1015  . ERCP with sphincterotomy  2003   Dr. Gala Romney: balloon dredging of bile duct, normal ampulla, choledocholithiasis   . EUS  2016   Dr. Amalia Hailey: subtle 13X17 mm heterogenous lesion in pancreas neck, FNA negative for malignancy   . POLYPECTOMY  01/01/2016   Procedure: POLYPECTOMY;  Surgeon: Daneil Dolin, MD;  Location: [facility];  Service: Endoscopy;;   polypectomies x2-splenic flexure and cecal  . TONSILLECTOMY       reports that she has quit smoking.  Her smoking use included cigarettes. She has a 50.00 pack-year smoking history. She has never used smokeless tobacco. She reports that she does not drink alcohol or use drugs.  Allergies  Allergen Reactions  . Adhesive [Tape] Other (See Comments)    Tears skin  . Doxycycline Nausea And Vomiting  . Latex Itching and Rash    Family History  Problem Relation Age of Onset  . Heart disease Mother   . Cancer Father        liver  . Cancer Son   . Heart disease Son   . Cancer Maternal Grandmother   .  Colon cancer Neg Hx      Prior to Admission medications   Medication Sig Start Date End Date Taking? Authorizing Provider  acetaminophen (TYLENOL) 500 MG tablet Take 1,000 mg by mouth every 6 (six) hours as needed.    [provider]  aspirin 81 MG chewable tablet Chew 81 mg by mouth as needed.    [provider]  AZOR 10-40 MG per tablet Take 1 tablet by mouth daily. Reported on 12/04/2015 07/06/13   [provider]  Calcium Citrate (CITRACAL PO) Take 1 tablet by mouth every evening.     [provider]  Cholecalciferol (VITAMIN D PO) Take 1 tablet by mouth every evening.     [provider]  HYDROcodone-acetaminophen (NORCO) 10-325 MG tablet Take 1 tablet by mouth 3 (three) times daily.     [provider]  hydrOXYzine (ATARAX/VISTARIL) 25 MG tablet Take 50-100 mg by mouth every 4 (four) hours as needed for itching.    [provider]  ibuprofen (ADVIL,MOTRIN) 200 MG tablet Take 400 mg by mouth every 6 (six) hours as needed for headache or moderate pain.    [provider]  loperamide (IMODIUM) 2 MG capsule Take 4 mg by mouth as needed for diarrhea or loose stools.    [provider]  ofloxacin (OCUFLOX) 0.3 % ophthalmic solution Place 2 drops into the right ear daily as needed (for pain). 2 drops in right ear daily prn. 01/14/14   [provider]  omeprazole (PRILOSEC) 20 MG capsule Take 20 mg by mouth daily.    [provider]  psyllium (REGULOID) 0.52 g capsule Take 0.52 g by mouth daily. Takes 2 daily    [provider]  umeclidinium-vilanterol (ANORO ELLIPTA) 62.5-25 MCG/INH AEPB Inhale 1 puff into the lungs as needed.     [provider]  venlafaxine XR (EFFEXOR-XR) 75 MG 24 hr capsule Take 75 mg by mouth 2 (two) times daily. 02/02/16   [provider]    Physical Exam: Vitals:   04/23/18 0415 04/23/18 0430 04/23/18 0445 04/23/18 0500  BP: (!) 118/92 (!) 127/94  (!) 149/88 140/83  Pulse: 83 (!) 101 (!) 101 99  Resp: 16 18 13 17   Temp:      TempSrc:      SpO2: (!) 88% (!) 89% 93% 93%     Constitutional: NAD, calm  Eyes: PERTLA, lids and conjunctivae normal ENMT: Mucous membranes are moist. Posterior pharynx clear of any exudate or lesions.   Neck: normal, supple, no masses, no thyromegaly Respiratory: clear to auscultation bilaterally, no wheezing, no crackles. Normal respiratory effort.   Cardiovascular: S1 & S2 heard, regular rate and rhythm. No extremity edema.   Abdomen: No distension, no tenderness, soft. Bowel sounds normal.  Musculoskeletal: no clubbing / cyanosis. No joint deformity upper and lower extremities.    Skin: no significant  rashes, lesions, ulcers. Warm, dry, well-perfused. Neurologic: Gross hearing deficit. Left pupil slightly larger than right. CN II-XII otherwise intact. Sensation intact. Strength 5/5 in all 4 limbs.  Psychiatric: Alert and oriented to person, place, and situation. Pleasant and cooperative.     Labs on Admission: I have personally reviewed following labs and imaging studies  CBC: Recent Labs  Lab 04/23/18 0044  WBC 7.9  NEUTROABS 6.1  HGB 11.5*  HCT 37.0  MCV 101.4*  PLT 542   Basic Metabolic Panel: Recent Labs  Lab 04/23/18 0044  NA 137  K 3.6  CL 106  CO2 23  GLUCOSE 122*  BUN 23  CREATININE 1.07*  CALCIUM 8.3*   GFR: CrCl cannot be calculated (Unknown ideal weight.). Liver Function Tests: Recent Labs  Lab 04/23/18 0044  AST 28  ALT 19  ALKPHOS 60  BILITOT 0.6  PROT 6.7  ALBUMIN 3.9   No results for input(s): LIPASE, AMYLASE in the last 168 hours. No results for input(s): AMMONIA in the last 168 hours. Coagulation Profile: No results for input(s): INR, PROTIME in the last 168 hours. Cardiac Enzymes: Recent Labs  Lab 04/23/18 0044 04/23/18 0326  TROPONINI 0.34* 0.71*   BNP (last 3 results) No results for input(s): PROBNP in the last 8760 hours. HbA1C: No results  for input(s): HGBA1C in the last 72 hours. CBG: No results for input(s): GLUCAP in the last 168 hours. Lipid Profile: No results for input(s): CHOL, HDL, LDLCALC, TRIG, CHOLHDL, LDLDIRECT in the last 72 hours. Thyroid Function Tests: No results for input(s): TSH, T4TOTAL, FREET4, T3FREE, THYROIDAB in the last 72 hours. Anemia Panel: No results for input(s): VITAMINB12, FOLATE, FERRITIN, TIBC, IRON, RETICCTPCT in the last 72 hours. Urine analysis:    Component Value Date/Time   COLORURINE YELLOW 04/23/2018 0110   APPEARANCEUR HAZY (A) 04/23/2018 0110   LABSPEC 1.016 04/23/2018 0110   PHURINE 6.0 04/23/2018 0110   GLUCOSEU NEGATIVE 04/23/2018 0110   HGBUR NEGATIVE 04/23/2018 0110   BILIRUBINUR NEGATIVE 04/23/2018 0110   KETONESUR NEGATIVE 04/23/2018 0110   PROTEINUR 30 (A) 04/23/2018 0110   UROBILINOGEN 0.2 11/30/2014 1221   NITRITE NEGATIVE 04/23/2018 0110   LEUKOCYTESUR TRACE (A) 04/23/2018 0110   Sepsis Labs: @LABRCNTIP (procalcitonin:4,lacticidven:4) )No results found for this or any previous visit (from the past 240 hour(s)).   Radiological Exams on Admission: Dg Chest 2 View  Result Date: 04/23/2018 CLINICAL DATA:  77 year old female with cough. EXAM: CHEST - 2 VIEW COMPARISON:  Chest radiograph dated 08/27/2016 FINDINGS: Mild diffuse interstitial coarsening. No focal consolidation, pleural effusion, pneumothorax. The cardiac silhouette is within normal limits. The aorta is tortuous. There is dilated appearance of the ascending aorta on the lateral view which may be partly related to tortuosity and superimposition of the heart shadow. The aorta is however poorly evaluated the radiograph. There is degenerative changes of the spine. No acute osseous pathology. IMPRESSION: 1. No acute cardiopulmonary process. 2. Dilated appearance of the ascending aorta on the lateral view may be partly related to tortuosity and superimposition of the heart shadow. Electronically Signed   By: Anner Crete M.D.   On: 04/23/2018 00:21   Ct Head Wo Contrast  Result Date: 04/23/2018 CLINICAL DATA:  Initial evaluation for acute difficulty with balance. EXAM: CT HEAD WITHOUT CONTRAST TECHNIQUE: Contiguous axial images were obtained from the base of the skull through the vertex without intravenous contrast. COMPARISON:  Prior CT from 12/21/2010. FINDINGS: Brain: Examination somewhat technically limited by patient positioning. Generalized age-related cerebral  atrophy, mildly progressed relative to 2012. Scattered patchy hypodensity involving the supratentorial cerebral white matter, most like related chronic small vessel ischemic disease, also mildly progressed. No acute intracranial hemorrhage. No acute large vessel territory infarct. No mass lesion, midline shift or mass effect. No hydrocephalus. No extra-axial fluid collection. Vascular: No hyperdense vessel. Scattered vascular calcifications noted within the carotid siphons. Skull: Scalp soft tissues and calvarium within normal limits. Sinuses/Orbits: Globes and orbital soft tissues demonstrate no acute finding. Visualized paranasal sinuses and mastoid air cells are clear. Other: None. IMPRESSION: 1. No acute intracranial abnormality. 2. Generalized cerebral atrophy with chronic small vessel ischemic disease, mildly advanced relative to 2012. Electronically Signed   By: Jeannine Boga M.D.   On: 04/23/2018 03:53    EKG: Independently reviewed. Sinus rhythm, non-specific ST abnormality inferiorly, similar to prior.   Assessment/Plan   1. NSTEMI  - Presents with recurrent episodes marked by SOB, malaise, fatigue, and diaphoresis that resolve over 20-30 minutes with rest  - She is found to have some low O2-saturations and elevated troponin  - EKG appears similar to prior  - Concern for possible NSTEMI, and PE is considered in light of hypoxia  - Plan to treat with ASA 324 mg, statin, and IV heparin infusion, check CTA chest, continue cardiac  monitoring, trend troponin, and repeat EKG    2. Chronic bronchitis  - Reports SOB during the episodes that prompted her presentation, but denies cough or SOB at rest  - Continue Anoro Ellipta and as-needed albuterol    3. Mild renal insufficiency  - SCr is 1.07 on admission, previously normal  - Hold ARB, provide gentle IVF hydration    4. Hypertension  - BP was low on presentation, now normal    - Hold amlodipine and ARB initially, consider beta-blocker if hypertensive     DVT prophylaxis: IV heparin infusion  Code Status: Full  Family Communication: Daughter updated at bedside Consults called: None Admission status: Observation     Vianne Bulls, MD [facility] Pager [phone]  If 7PM-7AM, please contact night-coverage [email or web address] Password [facility]  04/23/2018, 5:33 AM

## 2018-04-23 NOTE — ED Notes (Signed)
Date and time results received: 04/23/18 4:22 AM    Test: troponin Critical Value: 0.71  Name of Provider Notified: Dr Tomi Bamberger  Orders Received? Or Actions Taken?: MD notified

## 2018-04-23 NOTE — ED Notes (Signed)
Called [facility] with bed assignment and for transport to [facility].   They will send a truck when available.

## 2018-04-23 NOTE — Progress Notes (Signed)
ANTICOAGULATION CONSULT NOTE - Preliminary  Pharmacy Consult for Heparin Indication: ACS/Stemi  Allergies  Allergen Reactions  . Adhesive [Tape] Other (See Comments)    Tears skin  . Doxycycline Nausea And Vomiting  . Latex Itching and Rash    Patient Measurements: Weight: 141 lb (64 kg)     Vital Signs: Temp: 97.8 F (36.6 C) (10/30 2230) Temp Source: Oral (10/30 2230) BP: 140/83 (10/31 0500) Pulse Rate: 99 (10/31 0500)  Labs: Recent Labs    04/23/18 0044 04/23/18 0326  HGB 11.5*  --   HCT 37.0  --   PLT 212  --   CREATININE 1.07*  --   TROPONINI 0.34* 0.71*   Estimated Creatinine Clearance: 38.7 mL/min (A) (by C-G formula based on SCr of 1.07 mg/dL (H)).  Medical History: Past Medical History:  Diagnosis Date  . Back pain   . Cat scratch of right forearm 02/03/2014  . Hard of hearing   . Hemorrhoid 04/06/2014  . Hx of degenerative disc disease   . Hypercholesteremia 02/03/2014  . Hypercholesterolemia   . Hypertension   . Kidney stones   . Migraines   . Osteoarthritis   . Stroke [facility])    mini stroke, resolved in 30 minutes    Medications:   Assessment: 77 yo female seen in the ED for recurrent episode of acute SOB, fatigue, malaise, and diaphoresis since 10/29. Troponin is elevated since ED arrival and is increasing. Pharmacy has been consulted for IV heparin dosing.  Goal of Therapy:  Heparin level goal: 0.3-0.7 units/ml Monitor platelets by anticoagulation protocol   Plan:  Heparin bolus: 3000 units/IV Heparin infusion at 700 units/hr Heparin level in 6-8 hours  Preliminary review of pertinent patient information completed.  Forestine Na clinical pharmacist will complete review during morning rounds to assess the patient and finalize treatment regimen.  Norberto Sorenson, [facility] 04/23/2018,6:29 AM

## 2018-04-24 ENCOUNTER — Encounter (HOSPITAL_COMMUNITY): Payer: Self-pay | Admitting: Interventional Cardiology

## 2018-04-24 DIAGNOSIS — N179 Acute kidney failure, unspecified: Secondary | ICD-10-CM

## 2018-04-24 DIAGNOSIS — J9621 Acute and chronic respiratory failure with hypoxia: Secondary | ICD-10-CM

## 2018-04-24 DIAGNOSIS — I214 Non-ST elevation (NSTEMI) myocardial infarction: Principal | ICD-10-CM

## 2018-04-24 LAB — BASIC METABOLIC PANEL
ANION GAP: 8 (ref 5–15)
BUN: 13 mg/dL (ref 8–23)
CHLORIDE: 110 mmol/L (ref 98–111)
CO2: 22 mmol/L (ref 22–32)
Calcium: 8 mg/dL — ABNORMAL LOW (ref 8.9–10.3)
Creatinine, Ser: 0.8 mg/dL (ref 0.44–1.00)
GFR calc Af Amer: >60 mL/min (ref >60)
GLUCOSE: 107 mg/dL — AB (ref 70–99)
POTASSIUM: 3.8 mmol/L (ref 3.5–5.1)
Sodium: 140 mmol/L (ref 135–145)

## 2018-04-24 LAB — LIPID PANEL
CHOL/HDL RATIO: 2.8 ratio
Cholesterol: 202 mg/dL — ABNORMAL HIGH (ref 0–200)
HDL: 73 mg/dL (ref >40)
LDL CALC: 92 mg/dL (ref 0–99)
Triglycerides: 186 mg/dL — ABNORMAL HIGH (ref <150)
VLDL: 37 mg/dL (ref 0–40)

## 2018-04-24 LAB — CBC
HEMATOCRIT: 38.4 % (ref 36.0–46.0)
Hemoglobin: 11.6 g/dL — ABNORMAL LOW (ref 12.0–15.0)
MCH: 30.9 pg (ref 26.0–34.0)
MCHC: 30.2 g/dL (ref 30.0–36.0)
MCV: 102.1 fL — ABNORMAL HIGH (ref 80.0–100.0)
NRBC: 0 % (ref 0.0–0.2)
Platelets: 235 10*3/uL (ref 150–400)
RBC: 3.76 MIL/uL — ABNORMAL LOW (ref 3.87–5.11)
RDW: 13.2 % (ref 11.5–15.5)
WBC: 8.8 10*3/uL (ref 4.0–10.5)

## 2018-04-24 MED ORDER — METOPROLOL TARTRATE 25 MG PO TABS
25.0000 mg | ORAL_TABLET | Freq: Once | ORAL | Status: AC
  Administered 2018-04-24: 25 mg via ORAL
  Filled 2018-04-24: qty 1

## 2018-04-24 MED ORDER — IRBESARTAN 150 MG PO TABS
300.0000 mg | ORAL_TABLET | Freq: Every day | ORAL | Status: DC
  Administered 2018-04-24 – 2018-04-25 (×2): 300 mg via ORAL
  Filled 2018-04-24 (×2): qty 2

## 2018-04-24 MED ORDER — METOPROLOL TARTRATE 50 MG PO TABS
50.0000 mg | ORAL_TABLET | Freq: Two times a day (BID) | ORAL | Status: DC
  Administered 2018-04-24 – 2018-04-25 (×2): 50 mg via ORAL
  Filled 2018-04-24 (×2): qty 1

## 2018-04-24 MED ORDER — AMLODIPINE BESYLATE 10 MG PO TABS
10.0000 mg | ORAL_TABLET | Freq: Every day | ORAL | Status: DC
  Administered 2018-04-24 – 2018-04-25 (×2): 10 mg via ORAL
  Filled 2018-04-24 (×2): qty 1

## 2018-04-24 NOTE — Progress Notes (Signed)
Removed TR band from right radial. Bruising noted to site. Placed gauze and Tegaderm to site. Will continue to monitor

## 2018-04-24 NOTE — Progress Notes (Addendum)
Progress Note  Patient Name: Connie Wood Date of Encounter: 04/24/2018  Primary Cardiologist: Jenkins Rouge, MD *  Subjective   Feeling well. No chest pain, sob or palpitations.   Inpatient Medications    Scheduled Meds: . amLODipine  10 mg Oral Daily  . aspirin  81 mg Oral Daily  . atorvastatin  80 mg Oral q1800  . clopidogrel  75 mg Oral Daily  . darifenacin  7.5 mg Oral Daily  . heparin  5,000 Units Subcutaneous Q8H  . irbesartan  300 mg Oral Daily  . metoprolol tartrate  50 mg Oral BID  . pantoprazole  40 mg Oral Daily  . PARoxetine  10 mg Oral QHS  . sodium chloride flush  3 mL Intravenous Q12H  . umeclidinium-vilanterol  1 puff Inhalation Daily  . venlafaxine XR  75 mg Oral BID   Continuous Infusions: . sodium chloride     PRN Meds: sodium chloride, acetaminophen, albuterol, HYDROcodone-acetaminophen, hydrOXYzine, morphine injection, nitroGLYCERIN, ofloxacin, ondansetron (ZOFRAN) IV, oxyCODONE, sodium chloride flush   Vital Signs    Vitals:   04/23/18 1927 04/24/18 0500 04/24/18 0557 04/24/18 0800  BP: (!) 153/84  (!) 172/88 (!) 173/97  Pulse: 78  75 80  Resp: 18     Temp: 98.8 F (37.1 C)  98.4 F (36.9 C) 98.2 F (36.8 C)  TempSrc: Oral  Oral Oral  SpO2: 90%  91%   Weight:  61.6 kg      Intake/Output Summary (Last 24 hours) at 04/24/2018 0945 Last data filed at 04/23/2018 2015 Gross per 24 hour  Intake 933.83 ml  Output -  Net 933.83 ml   Filed Weights   04/23/18 0613 04/23/18 1133 04/24/18 0500  Weight: 64 kg 62.1 kg 61.6 kg    Telemetry    NSR- Personally Reviewed  ECG    N/a  Physical Exam   GEN: No acute distress.   Neck: No JVD Cardiac: RRR, no murmurs, rubs, or gallops. R radial cath site stable.  Respiratory: diffuse wheezing  GI: Soft, nontender, non-distended  MS: No edema; No deformity. Neuro:  Nonfocal  Psych: Normal affect   Labs    Chemistry Recent Labs  Lab 04/23/18 0044 04/23/18 2132  04/24/18 0403  NA 137  --  140  K 3.6  --  3.8  CL 106  --  110  CO2 23  --  22  GLUCOSE 122*  --  107*  BUN 23  --  13  CREATININE 1.07* 0.77 0.80  CALCIUM 8.3*  --  8.0*  PROT 6.7  --   --   ALBUMIN 3.9  --   --   AST 28  --   --   ALT 19  --   --   ALKPHOS 60  --   --   BILITOT 0.6  --   --   GFRNONAA 49* >60 >60  GFRAA 57* >60 >60  ANIONGAP 8  --  8     Hematology Recent Labs  Lab 04/23/18 0044 04/23/18 2132 04/24/18 0403  WBC 7.9 7.5 8.8  RBC 3.65* 3.54* 3.76*  HGB 11.5* 11.3* 11.6*  HCT 37.0 35.1* 38.4  MCV 101.4* 99.2 102.1*  MCH 31.5 31.9 30.9  MCHC 31.1 32.2 30.2  RDW 13.1 13.0 13.2  PLT 212 230 235    Cardiac Enzymes Recent Labs  Lab 04/23/18 0044 04/23/18 0326 04/23/18 0633  TROPONINI 0.34* 0.71* 0.74*   No results for input(s): TROPIPOC in the last  168 hours.      Radiology    Dg Chest 2 View  Result Date: 04/23/2018 CLINICAL DATA:  77 year old female with cough. EXAM: CHEST - 2 VIEW COMPARISON:  Chest radiograph dated 08/27/2016 FINDINGS: Mild diffuse interstitial coarsening. No focal consolidation, pleural effusion, pneumothorax. The cardiac silhouette is within normal limits. The aorta is tortuous. There is dilated appearance of the ascending aorta on the lateral view which may be partly related to tortuosity and superimposition of the heart shadow. The aorta is however poorly evaluated the radiograph. There is degenerative changes of the spine. No acute osseous pathology. IMPRESSION: 1. No acute cardiopulmonary process. 2. Dilated appearance of the ascending aorta on the lateral view may be partly related to tortuosity and superimposition of the heart shadow. Electronically Signed   By: Anner Crete M.D.   On: 04/23/2018 00:21   Ct Head Wo Contrast  Result Date: 04/23/2018 CLINICAL DATA:  Initial evaluation for acute difficulty with balance. EXAM: CT HEAD WITHOUT CONTRAST TECHNIQUE: Contiguous axial images were obtained from the base of  the skull through the vertex without intravenous contrast. COMPARISON:  Prior CT from 12/21/2010. FINDINGS: Brain: Examination somewhat technically limited by patient positioning. Generalized age-related cerebral atrophy, mildly progressed relative to 2012. Scattered patchy hypodensity involving the supratentorial cerebral white matter, most like related chronic small vessel ischemic disease, also mildly progressed. No acute intracranial hemorrhage. No acute large vessel territory infarct. No mass lesion, midline shift or mass effect. No hydrocephalus. No extra-axial fluid collection. Vascular: No hyperdense vessel. Scattered vascular calcifications noted within the carotid siphons. Skull: Scalp soft tissues and calvarium within normal limits. Sinuses/Orbits: Globes and orbital soft tissues demonstrate no acute finding. Visualized paranasal sinuses and mastoid air cells are clear. Other: None. IMPRESSION: 1. No acute intracranial abnormality. 2. Generalized cerebral atrophy with chronic small vessel ischemic disease, mildly advanced relative to 2012. Electronically Signed   By: Jeannine Boga M.D.   On: 04/23/2018 03:53   Ct Angio Chest Pe W Or Wo Contrast  Result Date: 04/23/2018 CLINICAL DATA:  77 year old female with hypoxia. Concern for pulmonary embolism. EXAM: CT ANGIOGRAPHY CHEST WITH CONTRAST TECHNIQUE: Multidetector CT imaging of the chest was performed using the standard protocol during bolus administration of intravenous contrast. Multiplanar CT image reconstructions and MIPs were obtained to evaluate the vascular anatomy. CONTRAST:  13mL ISOVUE-370 IOPAMIDOL (ISOVUE-370) INJECTION 76% COMPARISON:  Chest radiograph dated 04/22/2018 and CT dated 02/26/2016 FINDINGS: Cardiovascular: There is no cardiomegaly or pericardial effusion. Mild atherosclerotic calcification of the thoracic aorta. The visualized origins of the great vessels of the aortic arch appear patent. There is no CT evidence of  pulmonary embolism. Mediastinum/Nodes: No hilar or mediastinal adenopathy. Esophagus is grossly unremarkable. No mediastinal fluid collection. Lungs/Pleura: Small ground-glass area in the left upper lobe (series 6, image 44 appears similar to prior CT. There is no focal consolidation, pleural effusion, or pneumothorax. Small areas of endobronchial nodularity (series 6, image 44 appears similar to prior CT, likely benign given interval stability. Bronchoscopy may provide better evaluation if clinically indicated. Upper Abdomen: Mild biliary ductal dilatation. Partially visualized left renal upper pole irregularity, likely related to underlying cysts. This is not characterized. Ultrasound may provide better evaluation. Musculoskeletal: Degenerative changes of the spine. Review of the MIP images confirms the above findings. IMPRESSION: No acute intrathoracic pathology. No CT evidence of pulmonary embolism. Electronically Signed   By: Anner Crete M.D.   On: 04/23/2018 05:57    Cardiac Studies   LEFT HEART CATH AND  CORONARY ANGIOGRAPHY  04/23/18  Conclusion    Acute coronary syndrome was relatively flat troponin curve.  Left dominant coronary anatomy with marked epicardial vessel tortuosity  Widely patent left main  Eccentric hazy up to 50% proximal to mid LAD lesion with unusual characteristics raising the question of spontaneous coronary artery dissection.  TIMI grade III flow is noted.  Patient is asymptomatic.  Dominant circumflex with very tortuous obtuse marginal branches and PDA.  No significant obstruction.  Nondominant right coronary with calcification and ostial 50% narrowing  Poor quality left ventriculogram obtained by hand injection.  Upper normal LVEDP 16 to 19 mmHg.  No akinetic or severely hypokinetic segments are noted.  RECOMMENDATIONS:   Angiography demonstrates widely patent left coronary system with the exception of the proximal to mid LAD where there is unusual flow  within a somewhat hazy lesion that raises the question of spontaneous coronary artery dissection.  Plan to DC IV heparin.  Start Plavix.  Uptitrate beta-blocker therapy.  Monitor course clinically.  Restudy if significant recurrences of chest pain, otherwise would treat as SCAD and anticipate healing.  I would not discharge her from the hospital right away.  She needs 48 hours or longer in the hospital to assure stability.  Recommend uninterrupted dual antiplatelet therapy with Aspirin 81mg  daily and Clopidogrel 75mg  daily for a minimum of 12 months (ACS - Class I recommendation).   Diagnostic Diagram       Patient Profile     77 y.o. female HTN, HLD, and prior TIA presents with chest pain and ruled for NSTEMI leading to transfer from [facility].   Assessment & Plan    1. NSTEMI - Peak of troponin 0.74. Treated with IV heparin. CTA negative for PE. Cath showed proximal to mid LAD hazy lesion that raises the question of spontaneous coronary artery dissection.   Monitor course clinically.  Restudy if significant recurrences of chest pain, otherwise would treat as SCAD and anticipate healing. Observe for 48 hours or longer.  Continue ASA, Plavix and statin. Add Metoprolol yesterday>> will increase to 50mg  BID. No recurrent pain.   2. HTN - Elevated. Increase BB as above. Resume home amlodipine/ARB.   3. HLD -04/24/2018: Cholesterol 202; HDL 73; LDL Cholesterol 92; Triglycerides 186; VLDL 37  - Continue high intensity statin. Repeat labs in 6 weeks.    4. Wheezing - Per primary team  For questions or updates, please contact [place] Please consult [email or web address] for contact info under        Signed, Leanor Kail, PA  04/24/2018, 9:45 AM    I have personally seen and examined this patient. I agree with the assessment and plan as outlined above.  She is admitted with a NSTEMI. Possible SCAD in the LAD. She is feeling much better today. Plan medical management of CAD with ASA and  Plavix, statin and beta blocker. Anticipate monitoring 48 hours post cath. If stable could potentially be discharged home late tomorrow afternoon.   Lauree Chandler 04/24/2018 10:34 AM

## 2018-04-24 NOTE — Progress Notes (Signed)
PROGRESS NOTE   Connie Wood  FTD:[id number]    DOB: [birth year]    DOA: 04/22/2018  PCP: Celene Squibb, MD   I have briefly reviewed patients previous medical records in [facility].  Brief Narrative:  Patient is a 77 year old female with past medical history of hypertension and anxiety plus obesity who presented to the emergency room on the night of 10/30 with complaints of recurrent shortness of breath, fatigue and generalized aches and pains which have been going on since day prior.  In the emergency room at [facility] in [place], patient was noted to be afebrile, but tachypneic, tachycardic with initial blood pressure 74/38 and some subtle ST abnormalities.  Chest x-ray unrevealing.  Initial troponin elevated at 0.34 and subsequent troponin at 0.71.  Patient given IV fluids and oxygen.  Brought in for further observation.  Following arrival, seen by cardiology, who felt that patient was having a non-STEMI and recommended transfer to [facility].  Upon transfer to [facility], underwent cardiac catheterization.   Assessment & Plan:   Principal Problem:   NSTEMI (non-ST elevated myocardial infarction) ([place]) Active Problems:   Chronic bronchitis (HCC)   Hypertension   AKI (acute kidney injury) ([place])   Obesity (BMI 30-39.9)   Acute on chronic respiratory failure with hypoxia (HCC)   Hypotension   Abnormal cardiac enzyme level   Principal Problem:   NSTEMI (non-ST elevated myocardial infarction) [facility]):  Cardiology assisting with evaluation and management.  Troponin peaked at 0.74.  CTA chest negative for PE.  Treated with IV heparin and NTG.  Underwent cardiac cath 10/31 that showed proximal to mid LAD hazy lesion that raised question of spontaneous coronary artery dissection.  As per cardiology, continue aspirin, Plavix, statin, metoprolol added and dose increased to 50 mg twice daily and monitor 48 hours post-cath and if stable, potential discharge home 11/2  afternoon.  No chest pain.  Discussed with Dr. Angelena Form.  On heparin and nitroglycerin.  For cardiac cath.   Active Problems:   Chronic bronchitis (HCC) Stable without clinical bronchospasm.  Essential hypertension: Uncontrolled.  Beta-blockers increased.  Resumed home amlodipine/ARB.  Acute respiratory failure with hypoxia:  Unclear etiology.  Episodic.  Possibly due to COPD but may be undiagnosed sleep apnea?.  Still seems to have.'s of hypoxia and saturating in the low 90s.  Oxygen supplementation as needed and will need to check for oxygen requirements prior to discharge.  Hypotension:  Resolved with IV fluids.    AKI (acute kidney injury) ([place]):  Resolved.  Overweight/Body mass index is 24.82 kg/m.  Chronic pain:  Continue home medication  Macrocytic anemia Unclear etiology.  Stable.  Outpatient evaluation and follow-up.   DVT prophylaxis: Subcutaneous heparin Code Status: Full Family Communication: None at bedside Disposition: Home pending clinical improvement, possibly 11/2   Consultants:  Cardiology  Procedures:  Cardiac cath 10/31  Antimicrobials:  None   Subjective: Patient denies chest pain or dyspnea.  No palpitations or dizziness.  ROS: As above  Objective:  Vitals:   04/24/18 1057 04/24/18 1059 04/24/18 1208 04/24/18 1707  BP:  (!) 146/81  (!) 153/94  Pulse: 83 90 75 72  Resp:  13 14   Temp:   98.7 F (37.1 C)   TempSrc:   Oral   SpO2:  (!) 84% 92% 91%  Weight:        Examination:  General exam: Pleasant elderly female, moderately built and nourished, ambulating comfortably in the room this morning. Respiratory system:  Slightly diminished breath sounds in the bases but otherwise clear to auscultation without wheezing, rhonchi or crackles. Respiratory effort normal. Cardiovascular system: S1 & S2 heard, RRR. No JVD, murmurs, rubs, gallops or clicks. No pedal edema.  Telemetry personally reviewed: Sinus rhythm. Gastrointestinal  system: Abdomen is nondistended, soft and nontender. No organomegaly or masses felt. Normal bowel sounds heard. Central nervous system: Alert and oriented. No focal neurological deficits. Extremities: Symmetric 5 x 5 power. Skin: No rashes, lesions or ulcers Psychiatry: Judgement and insight appear normal. Mood & affect appropriate.     Data Reviewed: I have personally reviewed following labs and imaging studies  CBC: Recent Labs  Lab 04/23/18 0044 04/23/18 2132 04/24/18 0403  WBC 7.9 7.5 8.8  NEUTROABS 6.1  --   --   HGB 11.5* 11.3* 11.6*  HCT 37.0 35.1* 38.4  MCV 101.4* 99.2 102.1*  PLT 212 230 644   Basic Metabolic Panel: Recent Labs  Lab 04/23/18 0044 04/23/18 2132 04/24/18 0403  NA 137  --  140  K 3.6  --  3.8  CL 106  --  110  CO2 23  --  22  GLUCOSE 122*  --  107*  BUN 23  --  13  CREATININE 1.07* 0.77 0.80  CALCIUM 8.3*  --  8.0*   Liver Function Tests: Recent Labs  Lab 04/23/18 0044  AST 28  ALT 19  ALKPHOS 60  BILITOT 0.6  PROT 6.7  ALBUMIN 3.9   Coagulation Profile: Recent Labs  Lab 04/23/18 0633  INR 0.97   Cardiac Enzymes: Recent Labs  Lab 04/23/18 0044 04/23/18 0326 04/23/18 0633  TROPONINI 0.34* 0.71* 0.74*   HbA1C: No results for input(s): HGBA1C in the last 72 hours. CBG: No results for input(s): GLUCAP in the last 168 hours.  Recent Results (from the past 240 hour(s))  MRSA PCR Screening     Status: None   Collection Time: 04/23/18  1:20 PM  Result Value Ref Range Status   MRSA by PCR NEGATIVE NEGATIVE Final    Comment:        The GeneXpert MRSA Assay (FDA approved for NASAL specimens only), is one component of a comprehensive MRSA colonization surveillance program. It is not intended to diagnose MRSA infection nor to guide or monitor treatment for MRSA infections. Performed at [facility], [place] [address], [place],  [address]          Radiology Studies: Dg Chest 2 View  Result Date:  04/23/2018 CLINICAL DATA:  77 year old female with cough. EXAM: CHEST - 2 VIEW COMPARISON:  Chest radiograph dated 08/27/2016 FINDINGS: Mild diffuse interstitial coarsening. No focal consolidation, pleural effusion, pneumothorax. The cardiac silhouette is within normal limits. The aorta is tortuous. There is dilated appearance of the ascending aorta on the lateral view which may be partly related to tortuosity and superimposition of the heart shadow. The aorta is however poorly evaluated the radiograph. There is degenerative changes of the spine. No acute osseous pathology. IMPRESSION: 1. No acute cardiopulmonary process. 2. Dilated appearance of the ascending aorta on the lateral view may be partly related to tortuosity and superimposition of the heart shadow. Electronically Signed   By: Anner Crete M.D.   On: 04/23/2018 00:21   Ct Head Wo Contrast  Result Date: 04/23/2018 CLINICAL DATA:  Initial evaluation for acute difficulty with balance. EXAM: CT HEAD WITHOUT CONTRAST TECHNIQUE: Contiguous axial images were obtained from the base of the skull through the vertex without intravenous contrast. COMPARISON:  Prior CT from 12/21/2010. FINDINGS: Brain: Examination somewhat technically limited by patient positioning. Generalized age-related cerebral atrophy, mildly progressed relative to 2012. Scattered patchy hypodensity involving the supratentorial cerebral white matter, most like related chronic small vessel ischemic disease, also mildly progressed. No acute intracranial hemorrhage. No acute large vessel territory infarct. No mass lesion, midline shift or mass effect. No hydrocephalus. No extra-axial fluid collection. Vascular: No hyperdense vessel. Scattered vascular calcifications noted within the carotid siphons. Skull: Scalp soft tissues and calvarium within normal limits. Sinuses/Orbits: Globes and orbital soft tissues demonstrate no acute finding. Visualized paranasal sinuses and mastoid air cells  are clear. Other: None. IMPRESSION: 1. No acute intracranial abnormality. 2. Generalized cerebral atrophy with chronic small vessel ischemic disease, mildly advanced relative to 2012. Electronically Signed   By: Jeannine Boga M.D.   On: 04/23/2018 03:53   Ct Angio Chest Pe W Or Wo Contrast  Result Date: 04/23/2018 CLINICAL DATA:  77 year old female with hypoxia. Concern for pulmonary embolism. EXAM: CT ANGIOGRAPHY CHEST WITH CONTRAST TECHNIQUE: Multidetector CT imaging of the chest was performed using the standard protocol during bolus administration of intravenous contrast. Multiplanar CT image reconstructions and MIPs were obtained to evaluate the vascular anatomy. CONTRAST:  115mL ISOVUE-370 IOPAMIDOL (ISOVUE-370) INJECTION 76% COMPARISON:  Chest radiograph dated 04/22/2018 and CT dated 02/26/2016 FINDINGS: Cardiovascular: There is no cardiomegaly or pericardial effusion. Mild atherosclerotic calcification of the thoracic aorta. The visualized origins of the great vessels of the aortic arch appear patent. There is no CT evidence of pulmonary embolism. Mediastinum/Nodes: No hilar or mediastinal adenopathy. Esophagus is grossly unremarkable. No mediastinal fluid collection. Lungs/Pleura: Small ground-glass area in the left upper lobe (series 6, image 44 appears similar to prior CT. There is no focal consolidation, pleural effusion, or pneumothorax. Small areas of endobronchial nodularity (series 6, image 44 appears similar to prior CT, likely benign given interval stability. Bronchoscopy may provide better evaluation if clinically indicated. Upper Abdomen: Mild biliary ductal dilatation. Partially visualized left renal upper pole irregularity, likely related to underlying cysts. This is not characterized. Ultrasound may provide better evaluation. Musculoskeletal: Degenerative changes of the spine. Review of the MIP images confirms the above findings. IMPRESSION: No acute intrathoracic pathology. No CT  evidence of pulmonary embolism. Electronically Signed   By: Anner Crete M.D.   On: 04/23/2018 05:57        Scheduled Meds: . amLODipine  10 mg Oral Daily  . aspirin  81 mg Oral Daily  . atorvastatin  80 mg Oral q1800  . clopidogrel  75 mg Oral Daily  . darifenacin  7.5 mg Oral Daily  . heparin  5,000 Units Subcutaneous Q8H  . irbesartan  300 mg Oral Daily  . metoprolol tartrate  50 mg Oral BID  . pantoprazole  40 mg Oral Daily  . PARoxetine  10 mg Oral QHS  . sodium chloride flush  3 mL Intravenous Q12H  . umeclidinium-vilanterol  1 puff Inhalation Daily  . venlafaxine XR  75 mg Oral BID   Continuous Infusions: . sodium chloride       LOS: 1 day     Vernell Leep, MD, FACP, [facility]. [facility] Pager [phone]  If 7PM-7AM, please contact night-coverage [email or web address] Password [facility] 04/24/2018, 7:46 PM

## 2018-04-25 DIAGNOSIS — I10 Essential (primary) hypertension: Secondary | ICD-10-CM

## 2018-04-25 MED ORDER — ACETAMINOPHEN 500 MG PO TABS: 1000.0000 mg | ORAL_TABLET | Freq: Three times a day (TID) | ORAL | Status: AC | PRN

## 2018-04-25 MED ORDER — METOPROLOL TARTRATE 100 MG PO TABS: 100.0000 mg | ORAL_TABLET | Freq: Two times a day (BID) | ORAL | 0 refills | Status: DC

## 2018-04-25 MED ORDER — ATORVASTATIN CALCIUM 80 MG PO TABS: 80.0000 mg | ORAL_TABLET | Freq: Every day | ORAL | 0 refills | Status: DC

## 2018-04-25 MED ORDER — UMECLIDINIUM-VILANTEROL 62.5-25 MCG/INH IN AEPB: 1.0000 | INHALATION_SPRAY | Freq: Every day | RESPIRATORY_TRACT | Status: DC

## 2018-04-25 MED ORDER — METOPROLOL TARTRATE 100 MG PO TABS: 100.0000 mg | ORAL_TABLET | Freq: Two times a day (BID) | ORAL | Status: DC

## 2018-04-25 MED ORDER — CLOPIDOGREL BISULFATE 75 MG PO TABS: 75.0000 mg | ORAL_TABLET | Freq: Every day | ORAL | 0 refills | Status: DC

## 2018-04-25 MED ORDER — ASPIRIN 81 MG PO CHEW: 81.0000 mg | CHEWABLE_TABLET | Freq: Every day | ORAL | 0 refills | Status: DC

## 2018-04-25 MED ORDER — ALBUTEROL SULFATE HFA 108 (90 BASE) MCG/ACT IN AERS: 2.0000 | INHALATION_SPRAY | Freq: Four times a day (QID) | RESPIRATORY_TRACT | 0 refills | Status: AC | PRN

## 2018-04-25 NOTE — Progress Notes (Signed)
SATURATION QUALIFICATIONS: (This note is used to comply with regulatory documentation for home oxygen)  Patient Saturations on Room Air at Rest = 94  Patient Saturations on Room Air while Ambulating = 93-96  Patient Saturations on 0 Liters of oxygen while Ambulating = N/A  Please briefly explain why patient needs home oxygen: Pt does not qualify for home oxygen

## 2018-04-25 NOTE — Discharge Instructions (Addendum)
Please get your medications reviewed and adjusted by your Primary MD. ° °Please request your Primary MD to go over all Hospital Tests and Procedure/Radiological results at the follow up, please get all Hospital records sent to your Prim MD by signing hospital release before you go home. ° °If you had Pneumonia of Lung problems at the Hospital: °Please get a 2 view Chest X ray done in 6-8 weeks after hospital discharge or sooner if instructed by your Primary MD. ° °If you have Congestive Heart Failure: °Please call your Cardiologist or Primary MD anytime you have any of the following symptoms:  °1) 3 pound weight gain in 24 hours or 5 pounds in 1 week  °2) shortness of breath, with or without a dry hacking cough  °3) swelling in the hands, feet or stomach  °4) if you have to sleep on extra pillows at night in order to breathe ° °Follow cardiac low salt diet and 1.5 lit/day fluid restriction. ° °If you have diabetes °Accuchecks 4 times/day, Once in AM empty stomach and then before each meal. °Log in all results and show them to your primary doctor at your next visit. °If any glucose reading is under 80 or above 300 call your primary MD immediately. ° °If you have Seizure/Convulsions/Epilepsy: °Please do not drive, operate heavy machinery, participate in activities at heights or participate in high speed sports until you have seen by Primary MD or a Neurologist and advised to do so again. ° °If you had Gastrointestinal Bleeding: °Please ask your Primary MD to check a complete blood count within one week of discharge or at your next visit. Your endoscopic/colonoscopic biopsies that are pending at the time of discharge, will also need to followed by your Primary MD. ° °Get Medicines reviewed and adjusted. °Please take all your medications with you for your next visit with your Primary MD ° °Please request your Primary MD to go over all hospital tests and procedure/radiological results at the follow up, please ask your  Primary MD to get all Hospital records sent to his/her office. ° °If you experience worsening of your admission symptoms, develop shortness of breath, life threatening emergency, suicidal or homicidal thoughts you must seek medical attention immediately by calling 911 or calling your MD immediately  if symptoms less severe. ° °You must read complete instructions/literature along with all the possible adverse reactions/side effects for all the Medicines you take and that have been prescribed to you. Take any new Medicines after you have completely understood and accpet all the possible adverse reactions/side effects.  ° °Do not drive or operate heavy machinery when taking Pain medications.  ° °Do not take more than prescribed Pain, Sleep and Anxiety Medications ° °Special Instructions: If you have smoked or chewed Tobacco  in the last 2 yrs please stop smoking, stop any regular Alcohol  and or any Recreational drug use. ° °Wear Seat belts while driving. ° °Please note °You were cared for by a hospitalist during your hospital stay. If you have any questions about your discharge medications or the care you received while you were in the hospital after you are discharged, you can call the unit and asked to speak with the hospitalist on call if the hospitalist that took care of you is not available. Once you are discharged, your primary care physician will handle any further medical issues. Please note that NO REFILLS for any discharge medications will be authorized once you are discharged, as it is imperative that you   return to your primary care physician (or establish a relationship with a primary care physician if you do not have one) for your aftercare needs so that they can reassess your need for medications and monitor your lab values.  You can reach the hospitalist office at phone [phone] or fax [phone]   If you do not have a primary care physician, you can call [phone] for a physician  referral.  Radial Site Care Refer to this sheet in the next few weeks. These instructions provide you with information about caring for yourself after your procedure. Your health care provider may also give you more specific instructions. Your treatment has been planned according to current medical practices, but problems sometimes occur. Call your health care provider if you have any problems or questions after your procedure. What can I expect after the procedure? After your procedure, it is typical to have the following:  Bruising at the radial site that usually fades within 1-2 weeks.  Blood collecting in the tissue (hematoma) that may be painful to the touch. It should usually decrease in size and tenderness within 1-2 weeks.  Follow these instructions at home:  Take medicines only as directed by your health care provider.  You may shower 24-48 hours after the procedure or as directed by your health care provider. Remove the bandage (dressing) and gently wash the site with plain soap and water. Pat the area dry with a clean towel. Do not rub the site, because this may cause bleeding.  Do not take baths, swim, or use a hot tub until your health care provider approves.  Check your insertion site every day for redness, swelling, or drainage.  Do not apply powder or lotion to the site.  Do not flex or bend the affected arm for 24 hours or as directed by your health care provider.  Do not push or pull heavy objects with the affected arm for 24 hours or as directed by your health care provider.  Do not lift over 10 lb (4.5 kg) for 5 days after your procedure or as directed by your health care provider.  Ask your health care provider when it is okay to: ? Return to work or school. ? Resume usual physical activities or sports. ? Resume sexual activity.  Do not drive home if you are discharged the same day as the procedure. Have someone else drive you.  You may drive 24 hours after  the procedure unless otherwise instructed by your health care provider.  Do not operate machinery or power tools for 24 hours after the procedure.  If your procedure was done as an outpatient procedure, which means that you went home the same day as your procedure, a responsible adult should be with you for the first 24 hours after you arrive home.  Keep all follow-up visits as directed by your health care provider. This is important. Contact a health care provider if:  You have a fever.  You have chills.  You have increased bleeding from the radial site. Hold pressure on the site. Get help right away if:  You have unusual pain at the radial site.  You have redness, warmth, or swelling at the radial site.  You have drainage (other than a small amount of blood on the dressing) from the radial site.  The radial site is bleeding, and the bleeding does not stop after 30 minutes of holding steady pressure on the site.  Your arm or hand becomes pale, cool, tingly, or  numb. This information is not intended to replace advice given to you by your health care provider. Make sure you discuss any questions you have with your health care provider. Document Released: 07/13/2010 Document Revised: 11/16/2015 Document Reviewed: 12/27/2013 Elsevier Interactive Patient Education  2018 [employer].

## 2018-04-25 NOTE — Progress Notes (Addendum)
I have sent a message to our office's scheduling team requesting a TOC follow-up appointment, and our office will call the patient with this information. Per Dr Curt Bears will arrange within 2 weeks.  Dayna Dunn PA-C

## 2018-04-25 NOTE — Plan of Care (Signed)

## 2018-04-25 NOTE — Progress Notes (Signed)
Progress Note  Patient Name: Connie Wood Date of Encounter: 04/25/2018  Primary Cardiologist: Jenkins Rouge, MD *  Subjective   Feeling well today without chest pain.  Inpatient Medications    Scheduled Meds: . amLODipine  10 mg Oral Daily  . aspirin  81 mg Oral Daily  . atorvastatin  80 mg Oral q1800  . clopidogrel  75 mg Oral Daily  . darifenacin  7.5 mg Oral Daily  . heparin  5,000 Units Subcutaneous Q8H  . irbesartan  300 mg Oral Daily  . metoprolol tartrate  50 mg Oral BID  . pantoprazole  40 mg Oral Daily  . PARoxetine  10 mg Oral QHS  . sodium chloride flush  3 mL Intravenous Q12H  . umeclidinium-vilanterol  1 puff Inhalation Daily  . venlafaxine XR  75 mg Oral BID   Continuous Infusions: . sodium chloride     PRN Meds: sodium chloride, acetaminophen, albuterol, HYDROcodone-acetaminophen, hydrOXYzine, morphine injection, nitroGLYCERIN, ofloxacin, ondansetron (ZOFRAN) IV, sodium chloride flush   Vital Signs    Vitals:   04/25/18 0602 04/25/18 0817 04/25/18 0948 04/25/18 1150  BP: (!) 147/86  (!) 150/73 138/75  Pulse: 64 66 76 67  Resp:  15    Temp: 98.1 F (36.7 C)  98.3 F (36.8 C) 98.5 F (36.9 C)  TempSrc: Oral  Oral Oral  SpO2: 90% 93% 92% 94%  Weight: 62.8 kg       Intake/Output Summary (Last 24 hours) at 04/25/2018 1155 Last data filed at 04/25/2018 0626 Gross per 24 hour  Intake 360 ml  Output 930 ml  Net -570 ml   Filed Weights   04/23/18 1133 04/24/18 0500 04/25/18 0602  Weight: 62.1 kg 61.6 kg 62.8 kg    Telemetry    NSR- Personally Reviewed  ECG    N/a  Physical Exam   GEN: No acute distress.   Neck: No JVD Cardiac: RRR, no murmurs, rubs, or gallops. R radial cath site stable.  Respiratory: diffuse wheezing  GI: Soft, nontender, non-distended  MS: No edema; No deformity. Neuro:  Nonfocal  Psych: Normal affect   Labs    Chemistry Recent Labs  Lab 04/23/18 0044 04/23/18 2132 04/24/18 0403  NA 137  --   140  K 3.6  --  3.8  CL 106  --  110  CO2 23  --  22  GLUCOSE 122*  --  107*  BUN 23  --  13  CREATININE 1.07* 0.77 0.80  CALCIUM 8.3*  --  8.0*  PROT 6.7  --   --   ALBUMIN 3.9  --   --   AST 28  --   --   ALT 19  --   --   ALKPHOS 60  --   --   BILITOT 0.6  --   --   GFRNONAA 49* >60 >60  GFRAA 57* >60 >60  ANIONGAP 8  --  8     Hematology Recent Labs  Lab 04/23/18 0044 04/23/18 2132 04/24/18 0403  WBC 7.9 7.5 8.8  RBC 3.65* 3.54* 3.76*  HGB 11.5* 11.3* 11.6*  HCT 37.0 35.1* 38.4  MCV 101.4* 99.2 102.1*  MCH 31.5 31.9 30.9  MCHC 31.1 32.2 30.2  RDW 13.1 13.0 13.2  PLT 212 230 235    Cardiac Enzymes Recent Labs  Lab 04/23/18 0044 04/23/18 0326 04/23/18 0633  TROPONINI 0.34* 0.71* 0.74*   No results for input(s): TROPIPOC in the last 168 hours.  Radiology    No results found.  Cardiac Studies   LEFT HEART CATH AND CORONARY ANGIOGRAPHY  04/23/18  Conclusion    Acute coronary syndrome was relatively flat troponin curve.  Left dominant coronary anatomy with marked epicardial vessel tortuosity  Widely patent left main  Eccentric hazy up to 50% proximal to mid LAD lesion with unusual characteristics raising the question of spontaneous coronary artery dissection.  TIMI grade III flow is noted.  Patient is asymptomatic.  Dominant circumflex with very tortuous obtuse marginal branches and PDA.  No significant obstruction.  Nondominant right coronary with calcification and ostial 50% narrowing  Poor quality left ventriculogram obtained by hand injection.  Upper normal LVEDP 16 to 19 mmHg.  No akinetic or severely hypokinetic segments are noted.  RECOMMENDATIONS:   Angiography demonstrates widely patent left coronary system with the exception of the proximal to mid LAD where there is unusual flow within a somewhat hazy lesion that raises the question of spontaneous coronary artery dissection.  Plan to DC IV heparin.  Start Plavix.  Uptitrate  beta-blocker therapy.  Monitor course clinically.  Restudy if significant recurrences of chest pain, otherwise would treat as SCAD and anticipate healing.  I would not discharge her from the hospital right away.  She needs 48 hours or longer in the hospital to assure stability.  Recommend uninterrupted dual antiplatelet therapy with Aspirin 81mg  daily and Clopidogrel 75mg  daily for a minimum of 12 months (ACS - Class I recommendation).   Diagnostic Diagram       Patient Profile     77 y.o. female HTN, HLD, and prior TIA presents with chest pain and ruled for NSTEMI leading to transfer from [facility].   Assessment & Plan    1. NSTEMI Peak troponin 0.74. Treated with heparin. Cath with possible LAD dissection and no intervention. No further chest pain. Plan for discharge today on ASA, plavix, statin.   2. HTN Agree with resuming home meds. Quintez Maselli increase metoprolol  3. HLD -04/24/2018: Cholesterol 202; HDL 73; LDL Cholesterol 92; Triglycerides 186; VLDL 37  Continue high intensity statin   4. Wheezing - Per primary team  For questions or updates, please contact [place] Please consult [email or web address] for contact info under        Signed, Kalasia Crafton Meredith Leeds, MD  04/25/2018, 11:55 AM    [facility] [place] sign off.   Medication Recommendations:  Continue ASA, plavix, atorvastatin, metoprolol Other recommendations (labs, testing, etc):  none Follow up as an outpatient:  To be arranged in 2-3 weeks

## 2018-04-25 NOTE — Discharge Summary (Signed)
Physician Discharge Summary  Connie Wood MPN:[id number] DOB: February 08, 1941  PCP: Celene Squibb, MD  Admit date: 04/22/2018 Discharge date: 04/25/2018  Recommendations for Outpatient Follow-up:  1. Dr. Wende Neighbors, PCP in 1 week with repeat labs (CBC & BMP).  Patient has been advised to take all her prescription and nonprescription medication bottles for review during this visit. 2. Dr. Jenkins Rouge, Cardiology: Office will call patient to arrange follow-up appointment in 2 weeks.  Home Health: None Equipment/Devices: None  Discharge Condition: Improved and stable CODE STATUS: Full Diet recommendation: Heart healthy diet.  Discharge Diagnoses:  Principal Problem:   NSTEMI (non-ST elevated myocardial infarction) ([place]) Active Problems:   Chronic bronchitis (HCC)   Hypertension   AKI (acute kidney injury) ([place])   Obesity (BMI 30-39.9)   Acute on chronic respiratory failure with hypoxia (HCC)   Hypotension   Abnormal cardiac enzyme level   Brief Summary: 77 year old female, lives alone, ambulates with the help of a cane, not on home oxygen, PMH of chronic pain, hard of hearing, HLD, HTN, stroke, former smoker, chronic bronchitis and anxiety initially presented to [facility] for evaluation of recurrent episodes of dyspnea, fatigue, general aches, malaise and diaphoresis which started 2 days PTA.  She reported feeling as though she was going to die during 1 of these episodes and called her daughter.  Her daughter came over and witnessed 1 of these episodes, reported that patient was sweaty and holding her chest.  She appeared to be short of breath at that time.  Patient denied fever, chills, cough or swelling or tenderness in the lower extremities.  In the ED, she was afebrile, hypoxic in the mid 80s on room air, mildly tachypneic, tachycardic with initial blood pressure of 74/38.  EKG showed sinus rhythm with subtle ST abnormality similar to prior.  Chest x-ray negative for acute  disease.  CT head negative for acute findings.  Initial troponin was 0.34 that increased to 0.71 couple of hours later.  She received prednisone 40 mg, albuterol and Zofran in ED.  She was diagnosed with NSTEMI, cardiology was consulted and recommended transferring to [facility] for further evaluation and management.  Assessment and plan:  1. NSTEMI: Cardiology was consulted and as per their note, she was last seen by her primary cardiologist in April 2018 when she denied any specific chest pain or dyspnea on exertion.  She reported having been told that she had carotid artery stenosis in the past and therefore a carotid artery duplex and AAA screening US were obtained and no changes were made in her medication regimen at that time.  Now presented to [facility] with episodes of dyspnea, diaphoresis and associated numbness and tingling down her upper extremities.  Troponin 0.34 > 0.71 > 0.74 consistent with NSTEMI.  EKG without acute changes.  CTA chest negative for PE.  She was started on IV heparin infusion, aspirin, beta-blockers and high intensity statins.  As per cardiology recommendations, she was transferred to [facility] for cardiac catheterization.  Cardiac catheterization 10/31 showed proximal to mid LAD hazy lesion that raised the question of spontaneous coronary artery dissection/SCAD (detailed report as below).  Post-cath she was monitored in the hospital for 48 hours and treated as SCAD and anticipate healing.  No recurrence of chest pain, dyspnea, diaphoresis or tingling that she had on admission.  Cardiology has seen her today and cleared her for discharge home on aspirin, Plavix (recommended DAPT for a minimum of 12 months), high intensity statin and metoprolol.  Outpatient follow-up with cardiology. 2. Essential hypertension/hypotension: Hypotension on admission resolved with IV fluids.  Then became hypertensive.  Continued patient home [place].  Metoprolol was started and titrated up.  Improved control.  Outpatient  follow-up. 3. Hyperlipidemia: 04/24/2018: Cholesterol 202, HDL 73, LDL 92, TG 186 and VLDL 37.  Continue high intensity statins and repeat labs (lipid panel and LFTs) in 6 weeks. 4. Acute on chronic bronchitis: Acute bronchitis, possibly viral, seems to have resolved.  No clinical bronchospasm.  Former smoker.  Added PRN albuterol inhaler for wheezing or dyspnea. 5. Acute on chronic respiratory failure with hypoxia: Possibly due to acute bronchitis complicating COPD.  CTA chest negative for PE.  Resolved at discharge.  Some question of undiagnosed sleep apnea although her body habitus not in keeping and may consider further evaluation for this as outpatient. 6. Acute kidney injury: Creatinine 1.07 on admission.  Resolved. 7. Overweight/Body mass index is 25.33 kg/m. 8. Chronic pain: Patient on polypharmacy at home including high-dose acetaminophen, ibuprofen and opioids.  She is newly on aspirin and Plavix, hence discontinued ibuprofen to avoid GI side effects.  Also reduced Tylenol and advised patient and son that she should not take more than 4 g/day of Tylenol to avoid toxicity.  They verbalized understanding. 9. Polypharmacy: Advised patient and son that they should take her all her medications during next PCP visit to optimize.  They verbalized understanding. 10. Macrocytic anemia: Stable.  Outpatient follow-up and evaluation as deemed necessary. 11. Hard of hearing: Patient reports that she has "holes in her eardrum".  This is long-standing.   Consultations:  Cardiology.  Procedures:  Left heart cath and coronary angiography:  Conclusion:   Acute coronary syndrome was relatively flat troponin curve.  Left dominant coronary anatomy with marked epicardial vessel tortuosity  Widely patent left main  Eccentric hazy up to 50% proximal to mid LAD lesion with unusual characteristics raising the question of spontaneous coronary artery dissection.  TIMI grade III flow is noted.  Patient is  asymptomatic.  Dominant circumflex with very tortuous obtuse marginal branches and PDA.  No significant obstruction.  Nondominant right coronary with calcification and ostial 50% narrowing  Poor quality left ventriculogram obtained by hand injection.  Upper normal LVEDP 16 to 19 mmHg.  No akinetic or severely hypokinetic segments are noted.  RECOMMENDATIONS:   Angiography demonstrates widely patent left coronary system with the exception of the proximal to mid LAD where there is unusual flow within a somewhat hazy lesion that raises the question of spontaneous coronary artery dissection.  Plan to DC IV heparin.  Start Plavix.  Uptitrate beta-blocker therapy.  Monitor course clinically.  Restudy if significant recurrences of chest pain, otherwise would treat as SCAD and anticipate healing.  I would not discharge her from the hospital right away.  She needs 48 hours or longer in the hospital to assure stability.  Recommend uninterrupted dual antiplatelet therapy with Aspirin 81mg  daily and Clopidogrel 75mg  daily for a minimum of 12 months (ACS - Class I recommendation).   Discharge Instructions  Discharge Instructions    Call MD for:  difficulty breathing, headache or visual disturbances   Complete by:  As directed    Call MD for:  extreme fatigue   Complete by:  As directed    Call MD for:  persistant dizziness or light-headedness   Complete by:  As directed    Call MD for:  severe uncontrolled pain   Complete by:  As directed    Diet -  low sodium heart healthy   Complete by:  As directed    Discharge instructions   Complete by:  As directed    Total acetaminophen/Tylenol dose from all sources not to exceed 4 g/day.   Increase activity slowly   Complete by:  As directed        Medication List    STOP taking these medications   hydrOXYzine 25 MG tablet Commonly known as:  ATARAX/VISTARIL   ibuprofen 200 MG tablet Commonly known as:  ADVIL,MOTRIN     TAKE these  medications   acetaminophen 500 MG tablet Commonly known as:  TYLENOL Take 2 tablets (1,000 mg total) by mouth every 8 (eight) hours as needed for mild pain, moderate pain or headache. What changed:    when to take this  reasons to take this   albuterol 108 (90 Base) MCG/ACT inhaler Commonly known as:  PROVENTIL HFA;VENTOLIN HFA Inhale 2 puffs into the lungs every 6 (six) hours as needed for wheezing or shortness of breath.   aspirin 81 MG chewable tablet Chew 1 tablet (81 mg total) by mouth daily. What changed:    when to take this  reasons to take this   atorvastatin 80 MG tablet Commonly known as:  LIPITOR Take 1 tablet (80 mg total) by mouth daily at 6 PM.   AZOR 10-40 MG tablet Generic drug:  amLODipine-olmesartan Take 1 tablet by mouth daily. Reported on 12/04/2015   CITRACAL PO Take 1 tablet by mouth every evening.   clopidogrel 75 MG tablet Commonly known as:  PLAVIX Take 1 tablet (75 mg total) by mouth daily. Start taking on:  04/26/2018   HYDROcodone-acetaminophen 10-325 MG tablet Commonly known as:  NORCO Take 1 tablet by mouth 3 (three) times daily.   hydrOXYzine 50 MG capsule Commonly known as:  VISTARIL Take 50 mg by mouth every 6 (six) hours as needed for itching.   loperamide 2 MG capsule Commonly known as:  IMODIUM Take 4 mg by mouth as needed for diarrhea or loose stools.   metoprolol tartrate 100 MG tablet Commonly known as:  LOPRESSOR Take 1 tablet (100 mg total) by mouth 2 (two) times daily.   ofloxacin 0.3 % ophthalmic solution Commonly known as:  OCUFLOX Place 2 drops into the right ear daily as needed (for pain). 2 drops in right ear daily prn.   omeprazole 20 MG capsule Commonly known as:  PRILOSEC Take 20 mg by mouth daily.   PARoxetine Mesylate 7.5 MG Caps Take 7.5 mg by mouth at bedtime.   psyllium 0.52 g capsule Commonly known as:  REGULOID Take 0.52 g by mouth daily. Takes 2 daily   Trospium Chloride 60 MG Cp24 Take 60  mg by mouth daily.   umeclidinium-vilanterol 62.5-25 MCG/INH Aepb Commonly known as:  ANORO ELLIPTA Inhale 1 puff into the lungs daily. What changed:    when to take this  reasons to take this   venlafaxine XR 75 MG 24 hr capsule Commonly known as:  EFFEXOR-XR Take 150 mg by mouth daily with breakfast.   VITAMIN D PO Take 1 tablet by mouth every evening.      Follow-up Information    Josue Hector, MD Follow up.   Specialty:  Cardiology Why:  The office will call you to set up outpatient follow-up. Please call if you have not heard back within 3 business days. Contact information: [address] [place] [address] [phone]  Celene Squibb, MD. Schedule an appointment as soon as possible for a visit in 1 week(s).   Specialty:  Internal Medicine Why:  To be seen with repeat labs (CBC & BMP).  Please take all your prescription and nonprescription medication bottles for review during MD visit. Contact information: [place] [address] [phone]          Allergies  Allergen Reactions  . Adhesive [Tape] Other (See Comments)    Tears skin  . Doxycycline Nausea And Vomiting  . Latex Itching and Rash      Procedures/Studies: Dg Chest 2 View  Result Date: 04/23/2018 CLINICAL DATA:  77 year old female with cough. EXAM: CHEST - 2 VIEW COMPARISON:  Chest radiograph dated 08/27/2016 FINDINGS: Mild diffuse interstitial coarsening. No focal consolidation, pleural effusion, pneumothorax. The cardiac silhouette is within normal limits. The aorta is tortuous. There is dilated appearance of the ascending aorta on the lateral view which may be partly related to tortuosity and superimposition of the heart shadow. The aorta is however poorly evaluated the radiograph. There is degenerative changes of the spine. No acute osseous pathology. IMPRESSION: 1. No acute cardiopulmonary process. 2. Dilated appearance of the ascending aorta  on the lateral view may be partly related to tortuosity and superimposition of the heart shadow. Electronically Signed   By: Anner Crete M.D.   On: 04/23/2018 00:21   Ct Head Wo Contrast  Result Date: 04/23/2018 CLINICAL DATA:  Initial evaluation for acute difficulty with balance. EXAM: CT HEAD WITHOUT CONTRAST TECHNIQUE: Contiguous axial images were obtained from the base of the skull through the vertex without intravenous contrast. COMPARISON:  Prior CT from 12/21/2010. FINDINGS: Brain: Examination somewhat technically limited by patient positioning. Generalized age-related cerebral atrophy, mildly progressed relative to 2012. Scattered patchy hypodensity involving the supratentorial cerebral white matter, most like related chronic small vessel ischemic disease, also mildly progressed. No acute intracranial hemorrhage. No acute large vessel territory infarct. No mass lesion, midline shift or mass effect. No hydrocephalus. No extra-axial fluid collection. Vascular: No hyperdense vessel. Scattered vascular calcifications noted within the carotid siphons. Skull: Scalp soft tissues and calvarium within normal limits. Sinuses/Orbits: Globes and orbital soft tissues demonstrate no acute finding. Visualized paranasal sinuses and mastoid air cells are clear. Other: None. IMPRESSION: 1. No acute intracranial abnormality. 2. Generalized cerebral atrophy with chronic small vessel ischemic disease, mildly advanced relative to 2012. Electronically Signed   By: Jeannine Boga M.D.   On: 04/23/2018 03:53   Ct Angio Chest Pe W Or Wo Contrast  Result Date: 04/23/2018 CLINICAL DATA:  77 year old female with hypoxia. Concern for pulmonary embolism. EXAM: CT ANGIOGRAPHY CHEST WITH CONTRAST TECHNIQUE: Multidetector CT imaging of the chest was performed using the standard protocol during bolus administration of intravenous contrast. Multiplanar CT image reconstructions and MIPs were obtained to evaluate the  vascular anatomy. CONTRAST:  164mL ISOVUE-370 IOPAMIDOL (ISOVUE-370) INJECTION 76% COMPARISON:  Chest radiograph dated 04/22/2018 and CT dated 02/26/2016 FINDINGS: Cardiovascular: There is no cardiomegaly or pericardial effusion. Mild atherosclerotic calcification of the thoracic aorta. The visualized origins of the great vessels of the aortic arch appear patent. There is no CT evidence of pulmonary embolism. Mediastinum/Nodes: No hilar or mediastinal adenopathy. Esophagus is grossly unremarkable. No mediastinal fluid collection. Lungs/Pleura: Small ground-glass area in the left upper lobe (series 6, image 44 appears similar to prior CT. There is no focal consolidation, pleural effusion, or pneumothorax. Small areas of endobronchial nodularity (series 6, image 44 appears similar to prior  CT, likely benign given interval stability. Bronchoscopy may provide better evaluation if clinically indicated. Upper Abdomen: Mild biliary ductal dilatation. Partially visualized left renal upper pole irregularity, likely related to underlying cysts. This is not characterized. Ultrasound may provide better evaluation. Musculoskeletal: Degenerative changes of the spine. Review of the MIP images confirms the above findings. IMPRESSION: No acute intrathoracic pathology. No CT evidence of pulmonary embolism. Electronically Signed   By: Anner Crete M.D.   On: 04/23/2018 05:57      Subjective: Patient denies complaints.  No chest pain, dyspnea, cough, palpitations, dizziness or lightheadedness.  Feels that she must have picked up "a cold from somewhere".  Denies sick contacts.  Discharge Exam:  Vitals:   04/25/18 0602 04/25/18 0817 04/25/18 0948 04/25/18 1150  BP: (!) 147/86  (!) 150/73 138/75  Pulse: 64 66 76 67  Resp:  15    Temp: 98.1 F (36.7 C)  98.3 F (36.8 C) 98.5 F (36.9 C)  TempSrc: Oral  Oral Oral  SpO2: 90% 93% 92% 94%  Weight: 62.8 kg       General exam: Pleasant elderly female, moderately built  and nourished, sitting up comfortably in bed. Respiratory system:  Clear to auscultation.  No wheezing, rhonchi or crackles appreciated.  No increased work of breathing. Cardiovascular system: S1 & S2 heard, RRR. No JVD, murmurs, rubs, gallops or clicks. No pedal edema.   Telemetry personally reviewed: Sinus rhythm. Gastrointestinal system: Abdomen is nondistended, soft and nontender. No organomegaly or masses felt. Normal bowel sounds heard. Central nervous system: Alert and oriented. No focal neurological deficits.  Hard of hearing. Extremities: Symmetric 5 x 5 power. Skin: No rashes, lesions or ulcers Psychiatry: Judgement and insight appear normal. Mood & affect appropriate.     The results of significant diagnostics from this hospitalization (including imaging, microbiology, ancillary and laboratory) are listed below for reference.     Microbiology: Recent Results (from the past 240 hour(s))  MRSA PCR Screening     Status: None   Collection Time: 04/23/18  1:20 PM  Result Value Ref Range Status   MRSA by PCR NEGATIVE NEGATIVE Final    Comment:        The GeneXpert MRSA Assay (FDA approved for NASAL specimens only), is one component of a comprehensive MRSA colonization surveillance program. It is not intended to diagnose MRSA infection nor to guide or monitor treatment for MRSA infections. Performed at [facility], [place] [address], [place], [place] [address]      Labs: CBC: Recent Labs  Lab 04/23/18 0044 04/23/18 2132 04/24/18 0403  WBC 7.9 7.5 8.8  NEUTROABS 6.1  --   --   HGB 11.5* 11.3* 11.6*  HCT 37.0 35.1* 38.4  MCV 101.4* 99.2 102.1*  PLT 212 230 703   Basic Metabolic Panel: Recent Labs  Lab 04/23/18 0044 04/23/18 2132 04/24/18 0403  NA 137  --  140  K 3.6  --  3.8  CL 106  --  110  CO2 23  --  22  GLUCOSE 122*  --  107*  BUN 23  --  13  CREATININE 1.07* 0.77 0.80  CALCIUM 8.3*  --  8.0*   Liver Function Tests: Recent Labs  Lab  04/23/18 0044  AST 28  ALT 19  ALKPHOS 60  BILITOT 0.6  PROT 6.7  ALBUMIN 3.9   Cardiac Enzymes: Recent Labs  Lab 04/23/18 0044 04/23/18 0326 04/23/18 0633  TROPONINI 0.34* 0.71* 0.74*   Lipid Profile  Recent Labs    04/24/18 0403  CHOL 202*  HDL 73  LDLCALC 92  TRIG 186*  CHOLHDL 2.8   Urinalysis    Component Value Date/Time   COLORURINE YELLOW 04/23/2018 0110   APPEARANCEUR HAZY (A) 04/23/2018 0110   LABSPEC 1.016 04/23/2018 0110   PHURINE 6.0 04/23/2018 0110   GLUCOSEU NEGATIVE 04/23/2018 0110   HGBUR NEGATIVE 04/23/2018 0110   BILIRUBINUR NEGATIVE 04/23/2018 0110   KETONESUR NEGATIVE 04/23/2018 0110   PROTEINUR 30 (A) 04/23/2018 0110   UROBILINOGEN 0.2 11/30/2014 1221   NITRITE NEGATIVE 04/23/2018 0110   LEUKOCYTESUR TRACE (A) 04/23/2018 0110   I discussed in detail with patient's son over the phone, updated care and answered questions.   Time coordinating discharge: 40 minutes  SIGNED:  Vernell Leep, MD, FACP, [facility]. [facility] Pager [phone]  If 7PM-7AM, please contact night-coverage [email or web address] Password [facility] 04/25/2018, 12:59 PM

## 2018-05-03 ENCOUNTER — Emergency Department (HOSPITAL_COMMUNITY): Payer: Medicare Other

## 2018-05-03 ENCOUNTER — Observation Stay (HOSPITAL_COMMUNITY)
Admission: EM | Admit: 2018-05-03 | Discharge: 2018-05-05 | Disposition: A | Discharge status: Home / Self Care | Payer: Medicare Other | Attending: Internal Medicine | Admitting: Internal Medicine

## 2018-05-03 ENCOUNTER — Encounter (HOSPITAL_COMMUNITY): Payer: Self-pay | Admitting: Emergency Medicine

## 2018-05-03 ENCOUNTER — Other Ambulatory Visit: Payer: Self-pay

## 2018-05-03 DIAGNOSIS — I5030 Unspecified diastolic (congestive) heart failure: Secondary | ICD-10-CM | POA: Insufficient documentation

## 2018-05-03 DIAGNOSIS — E782 Mixed hyperlipidemia: Secondary | ICD-10-CM | POA: Insufficient documentation

## 2018-05-03 DIAGNOSIS — R0600 Dyspnea, unspecified: Secondary | ICD-10-CM | POA: Diagnosis present

## 2018-05-03 DIAGNOSIS — D649 Anemia, unspecified: Secondary | ICD-10-CM | POA: Diagnosis not present

## 2018-05-03 DIAGNOSIS — R0602 Shortness of breath: Secondary | ICD-10-CM

## 2018-05-03 DIAGNOSIS — I10 Essential (primary) hypertension: Secondary | ICD-10-CM | POA: Diagnosis present

## 2018-05-03 DIAGNOSIS — Z91040 Latex allergy status: Secondary | ICD-10-CM | POA: Diagnosis not present

## 2018-05-03 DIAGNOSIS — I110 Hypertensive heart disease with heart failure: Secondary | ICD-10-CM | POA: Diagnosis not present

## 2018-05-03 DIAGNOSIS — R7989 Other specified abnormal findings of blood chemistry: Secondary | ICD-10-CM | POA: Diagnosis not present

## 2018-05-03 DIAGNOSIS — Z87891 Personal history of nicotine dependence: Secondary | ICD-10-CM | POA: Diagnosis not present

## 2018-05-03 DIAGNOSIS — I2510 Atherosclerotic heart disease of native coronary artery without angina pectoris: Secondary | ICD-10-CM | POA: Diagnosis not present

## 2018-05-03 DIAGNOSIS — J42 Unspecified chronic bronchitis: Secondary | ICD-10-CM | POA: Diagnosis present

## 2018-05-03 DIAGNOSIS — E7800 Pure hypercholesterolemia, unspecified: Secondary | ICD-10-CM | POA: Diagnosis present

## 2018-05-03 DIAGNOSIS — I25119 Atherosclerotic heart disease of native coronary artery with unspecified angina pectoris: Secondary | ICD-10-CM | POA: Diagnosis not present

## 2018-05-03 DIAGNOSIS — Z79899 Other long term (current) drug therapy: Secondary | ICD-10-CM | POA: Diagnosis not present

## 2018-05-03 DIAGNOSIS — R778 Other specified abnormalities of plasma proteins: Secondary | ICD-10-CM | POA: Diagnosis present

## 2018-05-03 DIAGNOSIS — R0902 Hypoxemia: Secondary | ICD-10-CM

## 2018-05-03 HISTORY — DX: Dyspnea, unspecified: R06.00

## 2018-05-03 LAB — CBC
HCT: 35.1 % — ABNORMAL LOW (ref 36.0–46.0)
Hemoglobin: 11.1 g/dL — ABNORMAL LOW (ref 12.0–15.0)
MCH: 31.6 pg (ref 26.0–34.0)
MCHC: 31.6 g/dL (ref 30.0–36.0)
MCV: 100 fL (ref 80.0–100.0)
NRBC: 0 % (ref 0.0–0.2)
PLATELETS: 255 10*3/uL (ref 150–400)
RBC: 3.51 MIL/uL — AB (ref 3.87–5.11)
RDW: 13 % (ref 11.5–15.5)
WBC: 10 10*3/uL (ref 4.0–10.5)

## 2018-05-03 LAB — MAGNESIUM: Magnesium: 1.8 mg/dL (ref 1.7–2.4)

## 2018-05-03 LAB — BASIC METABOLIC PANEL
ANION GAP: 8 (ref 5–15)
BUN: 16 mg/dL (ref 8–23)
CO2: 23 mmol/L (ref 22–32)
Calcium: 8.8 mg/dL — ABNORMAL LOW (ref 8.9–10.3)
Chloride: 106 mmol/L (ref 98–111)
Creatinine, Ser: 0.88 mg/dL (ref 0.44–1.00)
Glucose, Bld: 118 mg/dL — ABNORMAL HIGH (ref 70–99)
Potassium: 3.5 mmol/L (ref 3.5–5.1)
SODIUM: 137 mmol/L (ref 135–145)

## 2018-05-03 LAB — HEPATIC FUNCTION PANEL
ALK PHOS: 93 U/L (ref 38–126)
ALT: 18 U/L (ref 0–44)
AST: 25 U/L (ref 15–41)
Albumin: 3.7 g/dL (ref 3.5–5.0)
BILIRUBIN INDIRECT: 0.8 mg/dL (ref 0.3–0.9)
BILIRUBIN TOTAL: 1 mg/dL (ref 0.3–1.2)
Bilirubin, Direct: 0.2 mg/dL (ref 0.0–0.2)
TOTAL PROTEIN: 6.8 g/dL (ref 6.5–8.1)

## 2018-05-03 LAB — PHOSPHORUS: Phosphorus: 3.5 mg/dL (ref 2.5–4.6)

## 2018-05-03 LAB — TROPONIN I
Troponin I: 0.09 ng/mL (ref <0.03)
Troponin I: 0.09 ng/mL (ref <0.03)

## 2018-05-03 LAB — I-STAT TROPONIN, ED: TROPONIN I, POC: 0.05 ng/mL (ref 0.00–0.08)

## 2018-05-03 LAB — BRAIN NATRIURETIC PEPTIDE: B NATRIURETIC PEPTIDE 5: 393 pg/mL — AB (ref 0.0–100.0)

## 2018-05-03 MED ORDER — AMLODIPINE-OLMESARTAN 10-40 MG PO TABS: 1.0000 | ORAL_TABLET | Freq: Every day | ORAL | Status: DC

## 2018-05-03 MED ORDER — OFLOXACIN 0.3 % OP SOLN
2.0000 [drp] | Freq: Every day | OPHTHALMIC | Status: DC | PRN
  Filled 2018-05-03: qty 5

## 2018-05-03 MED ORDER — HYDROCODONE-ACETAMINOPHEN 10-325 MG PO TABS
1.0000 | ORAL_TABLET | Freq: Three times a day (TID) | ORAL | Status: DC
  Administered 2018-05-04 – 2018-05-05 (×5): 1 via ORAL
  Filled 2018-05-03 (×5): qty 1

## 2018-05-03 MED ORDER — UMECLIDINIUM-VILANTEROL 62.5-25 MCG/INH IN AEPB
1.0000 | INHALATION_SPRAY | Freq: Every day | RESPIRATORY_TRACT | Status: DC
  Administered 2018-05-05: 1 via RESPIRATORY_TRACT
  Filled 2018-05-03: qty 14

## 2018-05-03 MED ORDER — ACETAMINOPHEN 650 MG RE SUPP: 650.0000 mg | Freq: Four times a day (QID) | RECTAL | Status: DC | PRN

## 2018-05-03 MED ORDER — PANTOPRAZOLE SODIUM 40 MG PO TBEC
40.0000 mg | DELAYED_RELEASE_TABLET | Freq: Every day | ORAL | Status: DC
  Administered 2018-05-04 – 2018-05-05 (×2): 40 mg via ORAL
  Filled 2018-05-03 (×2): qty 1

## 2018-05-03 MED ORDER — ENOXAPARIN SODIUM 40 MG/0.4ML ~~LOC~~ SOLN
40.0000 mg | SUBCUTANEOUS | Status: DC
  Administered 2018-05-04: 40 mg via SUBCUTANEOUS
  Filled 2018-05-03: qty 0.4

## 2018-05-03 MED ORDER — MAGNESIUM SULFATE 2 GM/50ML IV SOLN
2.0000 g | Freq: Once | INTRAVENOUS | Status: AC
  Administered 2018-05-04: 2 g via INTRAVENOUS
  Filled 2018-05-03: qty 50

## 2018-05-03 MED ORDER — ONDANSETRON HCL 4 MG PO TABS: 4.0000 mg | ORAL_TABLET | Freq: Four times a day (QID) | ORAL | Status: DC | PRN

## 2018-05-03 MED ORDER — IOPAMIDOL (ISOVUE-370) INJECTION 76%
75.0000 mL | Freq: Once | INTRAVENOUS | Status: AC | PRN
  Administered 2018-05-03: 75 mL via INTRAVENOUS

## 2018-05-03 MED ORDER — POTASSIUM CHLORIDE CRYS ER 20 MEQ PO TBCR
40.0000 meq | EXTENDED_RELEASE_TABLET | Freq: Once | ORAL | Status: AC
  Administered 2018-05-04: 40 meq via ORAL
  Filled 2018-05-03: qty 2

## 2018-05-03 MED ORDER — METOPROLOL TARTRATE 50 MG PO TABS
100.0000 mg | ORAL_TABLET | Freq: Once | ORAL | Status: DC
  Filled 2018-05-03: qty 2

## 2018-05-03 MED ORDER — LOPERAMIDE HCL 2 MG PO CAPS: 4.0000 mg | ORAL_CAPSULE | ORAL | Status: DC | PRN

## 2018-05-03 MED ORDER — FUROSEMIDE 10 MG/ML IJ SOLN
20.0000 mg | Freq: Once | INTRAMUSCULAR | Status: AC
  Administered 2018-05-03: 20 mg via INTRAVENOUS
  Filled 2018-05-03: qty 2

## 2018-05-03 MED ORDER — ACETAMINOPHEN 325 MG PO TABS: 650.0000 mg | ORAL_TABLET | Freq: Four times a day (QID) | ORAL | Status: DC | PRN

## 2018-05-03 MED ORDER — IPRATROPIUM-ALBUTEROL 0.5-2.5 (3) MG/3ML IN SOLN: 3.0000 mL | Freq: Once | RESPIRATORY_TRACT | Status: DC

## 2018-05-03 MED ORDER — ATORVASTATIN CALCIUM 40 MG PO TABS
80.0000 mg | ORAL_TABLET | Freq: Every day | ORAL | Status: DC
  Administered 2018-05-04 – 2018-05-05 (×2): 80 mg via ORAL
  Filled 2018-05-03 (×2): qty 2

## 2018-05-03 MED ORDER — ONDANSETRON HCL 4 MG/2ML IJ SOLN: 4.0000 mg | Freq: Four times a day (QID) | INTRAMUSCULAR | Status: DC | PRN

## 2018-05-03 MED ORDER — CLOPIDOGREL BISULFATE 75 MG PO TABS
75.0000 mg | ORAL_TABLET | Freq: Every day | ORAL | Status: DC
  Administered 2018-05-04 – 2018-05-05 (×2): 75 mg via ORAL
  Filled 2018-05-03 (×2): qty 1

## 2018-05-03 MED ORDER — LISINOPRIL 5 MG PO TABS: 2.5000 mg | ORAL_TABLET | Freq: Every day | ORAL | Status: DC

## 2018-05-03 MED ORDER — ASPIRIN 81 MG PO CHEW
81.0000 mg | CHEWABLE_TABLET | Freq: Every day | ORAL | Status: DC
  Administered 2018-05-04 – 2018-05-05 (×2): 81 mg via ORAL
  Filled 2018-05-03 (×2): qty 1

## 2018-05-03 MED ORDER — VENLAFAXINE HCL ER 75 MG PO CP24
150.0000 mg | ORAL_CAPSULE | Freq: Every day | ORAL | Status: DC
  Administered 2018-05-04 – 2018-05-05 (×2): 150 mg via ORAL
  Filled 2018-05-03 (×2): qty 2

## 2018-05-03 NOTE — ED Provider Notes (Signed)
[facility] EMERGENCY DEPARTMENT Provider Note   CSN: [id number] Arrival date & time: 05/03/18  1559     History   Chief Complaint Chief Complaint  Patient presents with  . Shortness of Breath    HPI Connie Wood is a 77 y.o. female.  Patient was admitted through any pain emergency department on 30 October for chest pain.  Ended up having diagnosis of non-STEMI.  Patient states she has had shortness of breath since then.  She also had CT angios that time without evidence of pulmonary embolism.  Was transferred from any [facility] emergency department down to [facility] for the admission.  Underwent cardiac cath which raise some concern about intimal flap in the LAD area.  No other significant disease process.  They opted to treat this medically.  Patient discharged home still with exertional shortness of breath.  Also with a nonproductive cough.  No leg swelling.  Some intermittent small bouts of chest pain none currently and none today.  No severe chest pain like she had with the prior admission.  Patient denies any fevers.  Patient does not have oxygen at home.  Upon arrival here her oxygen saturations at rest were 87%.  Patient put on 2 L of oxygen oxygen sats came up to 93% patient much more comfortable.  Patient states that at home she walks about 10 feet she gets very short of breath.     Past Medical History:  Diagnosis Date  . Back pain   . Cat scratch of right forearm 02/03/2014  . Hard of hearing   . Hemorrhoid 04/06/2014  . History of kidney stones   . Hx of degenerative disc disease   . Hypercholesterolemia   . Hypertension   . Migraines   . Osteoarthritis   . Stroke [facility])    mini stroke, resolved in 30 minutes    Patient Active Problem List   Diagnosis Date Noted  . NSTEMI (non-ST elevated myocardial infarction) ([place]) 04/23/2018  . AKI (acute kidney injury) ([place]) 04/23/2018  . Obesity (BMI 30-39.9) 04/23/2018  . Acute on chronic respiratory failure with hypoxia  ([place]) 04/23/2018  . Hypotension 04/23/2018  . Abnormal cardiac enzyme level   . IBS (irritable bowel syndrome) 01/06/2017  . Constipation 07/17/2016  . History of colonic polyps   . Common bile duct dilation 08/24/2014  . H/O acute pancreatitis 07/11/2014  . Hemorrhoid 04/06/2014  . Hypertension 02/03/2014  . Hypercholesteremia 02/03/2014  . Dyspnea 07/12/2013  . Chronic bronchitis ([place]) 07/11/2013  . Acute bronchitis 07/11/2013  . Nonhealing nonsurgical wound 04/05/2013  . Stasis edema 04/05/2013    Past Surgical History:  Procedure Laterality Date  . ABDOMINAL HYSTERECTOMY    . ANTERIOR AND POSTERIOR REPAIR    . APPENDECTOMY    . BILATERAL SALPINGOOPHORECTOMY    . BREAST SURGERY    . CHOLECYSTECTOMY    . colonoscopy  2003   Dr. Gala Romney: normal rectum, scattered diverticula  . COLONOSCOPY WITH PROPOFOL N/A 01/01/2016   Procedure: COLONOSCOPY WITH PROPOFOL;  Surgeon: Daneil Dolin, MD;  Location: [facility];  Service: Endoscopy;  Laterality: N/A;  1015  . ERCP with sphincterotomy  2003   Dr. Gala Romney: balloon dredging of bile duct, normal ampulla, choledocholithiasis   . EUS  2016   Dr. Amalia Hailey: subtle 13X17 mm heterogenous lesion in pancreas neck, FNA negative for malignancy   . LEFT HEART CATH AND CORONARY ANGIOGRAPHY N/A 04/23/2018   Procedure: LEFT HEART CATH AND CORONARY ANGIOGRAPHY;  Surgeon: Tamala Julian,  Lynnell Dike, MD;  Location: [place] [facility] LAB;  Service: Cardiovascular;  Laterality: N/A;  . POLYPECTOMY  01/01/2016   Procedure: POLYPECTOMY;  Surgeon: Daneil Dolin, MD;  Location: [facility];  Service: Endoscopy;;   polypectomies x2-splenic flexure and cecal  . TONSILLECTOMY       OB History    Gravida  5   Para  3   Term      Preterm      AB  2   Living  3     SAB  2   TAB      Ectopic      Multiple      Live Births               Home Medications    Prior to Admission medications   Medication Sig Start Date End Date Taking? Authorizing  Provider  acetaminophen (TYLENOL) 500 MG tablet Take 2 tablets (1,000 mg total) by mouth every 8 (eight) hours as needed for mild pain, moderate pain or headache. 04/25/18  Yes Hongalgi, Lenis Dickinson, MD  albuterol (PROVENTIL HFA;VENTOLIN HFA) 108 (90 Base) MCG/ACT inhaler Inhale 2 puffs into the lungs every 6 (six) hours as needed for wheezing or shortness of breath. 04/25/18  Yes Hongalgi, Lenis Dickinson, MD  aspirin 81 MG chewable tablet Chew 1 tablet (81 mg total) by mouth daily. Patient taking differently: Chew 81 mg by mouth daily as needed for mild pain or moderate pain.  04/25/18  Yes Hongalgi, Lenis Dickinson, MD  AZOR 10-40 MG per tablet Take 1 tablet by mouth daily. Reported on 12/04/2015 07/06/13  Yes [provider]  Calcium Citrate (CITRACAL PO) Take 1 tablet by mouth daily.    Yes [provider]  Cholecalciferol (VITAMIN D PO) Take 1 tablet by mouth daily.    Yes [provider]  HYDROcodone-acetaminophen (NORCO) 10-325 MG tablet Take 1 tablet by mouth 3 (three) times daily.    Yes [provider]  loperamide (IMODIUM) 2 MG capsule Take 4 mg by mouth as needed for diarrhea or loose stools.   Yes [provider]  ofloxacin (OCUFLOX) 0.3 % ophthalmic solution Place 2 drops into the right ear daily as needed (for pain). 2 drops in right ear daily prn. 01/14/14  Yes [provider]  umeclidinium-vilanterol (ANORO ELLIPTA) 62.5-25 MCG/INH AEPB Inhale 1 puff into the lungs daily. 04/25/18  Yes Modena Jansky, MD  venlafaxine XR (EFFEXOR-XR) 75 MG 24 hr capsule Take 150 mg by mouth daily with breakfast.  02/02/16  Yes [provider]  atorvastatin (LIPITOR) 80 MG tablet Take 1 tablet (80 mg total) by mouth daily at 6 PM. 04/25/18   Hongalgi, Lenis Dickinson, MD  clopidogrel (PLAVIX) 75 MG tablet Take 1 tablet (75 mg total) by mouth daily. 04/26/18   Hongalgi, Lenis Dickinson, MD  metoprolol tartrate (LOPRESSOR) 100 MG tablet Take 1 tablet (100 mg total) by mouth 2 (two)  times daily. 04/25/18   Hongalgi, Lenis Dickinson, MD  omeprazole (PRILOSEC) 20 MG capsule Take 20 mg by mouth daily.    [provider]  PARoxetine Mesylate 7.5 MG CAPS Take 7.5 mg by mouth at bedtime. 03/24/18   [provider]  Trospium Chloride 60 MG CP24 Take 60 mg by mouth daily.    [provider]    Family History Family History  Problem Relation Age of Onset  . Heart disease Mother   . Cancer Father  liver  . Cancer Son   . Heart disease Son   . Cancer Maternal Grandmother   . Colon cancer Neg Hx     Social History Social History   Tobacco Use  . Smoking status: Former Smoker    Packs/day: 1.00    Years: 50.00    Pack years: 50.00    Types: Cigarettes  . Smokeless tobacco: Never Used  Substance Use Topics  . Alcohol use: No    Alcohol/week: 0.0 standard drinks  . Drug use: No     Allergies   Adhesive [tape]; Doxycycline; and Latex   Review of Systems Review of Systems  Constitutional: Negative for fever.  HENT: Negative for congestion.   Eyes: Negative for redness.  Respiratory: Negative for shortness of breath.   Cardiovascular: Negative for chest pain.  Gastrointestinal: Negative for abdominal pain.  Genitourinary: Negative for dysuria.  Musculoskeletal: Negative for back pain.  Skin: Negative for rash.  Neurological: Negative for headaches.  Hematological: Does not bruise/bleed easily.  Psychiatric/Behavioral: Negative for confusion.     Physical Exam Updated Vital Signs BP (!) 158/82   Pulse 66   Temp 98 F (36.7 C) (Oral)   Resp 20   Ht 1.6 m (5\' 3" )   Wt 64 kg   SpO2 93%   BMI 24.98 kg/m   Physical Exam  Constitutional: She is oriented to person, place, and time. She appears well-developed and well-nourished. No distress.  HENT:  Head: Normocephalic and atraumatic.  Mouth/Throat: Oropharynx is clear and moist.  Eyes: Pupils are equal, round, and reactive to light. Conjunctivae and EOM are normal.  Neck:  Neck supple.  Cardiovascular: Normal rate, regular rhythm and normal heart sounds.  Pulmonary/Chest: Effort normal and breath sounds normal. No respiratory distress. She has no wheezes. She has no rales.  Abdominal: Soft. Bowel sounds are normal.  Musculoskeletal: Normal range of motion. She exhibits no edema.  Neurological: She is alert and oriented to person, place, and time. No cranial nerve deficit or sensory deficit. She exhibits normal muscle tone. Coordination normal.  Skin: Skin is warm.  Nursing note and vitals reviewed.    ED Treatments / Results  Labs (all labs ordered are listed, but only abnormal results are displayed) Labs Reviewed  BASIC METABOLIC PANEL - Abnormal; Notable for the following components:      Result Value   Glucose, Bld 118 (*)    Calcium 8.8 (*)    All other components within normal limits  CBC - Abnormal; Notable for the following components:   RBC 3.51 (*)    Hemoglobin 11.1 (*)    HCT 35.1 (*)    All other components within normal limits  TROPONIN I - Abnormal; Notable for the following components:   Troponin I 0.09 (*)    All other components within normal limits  BRAIN NATRIURETIC PEPTIDE - Abnormal; Notable for the following components:   B Natriuretic Peptide 393.0 (*)    All other components within normal limits  I-STAT TROPONIN, ED    EKG EKG Interpretation  Date/Time:  Sunday May 03 2018 16:22:57 EST Ventricular Rate:  67 PR Interval:  118 QRS Duration: 86 QT Interval:  420 QTC Calculation: 443 R Axis:   65 Text Interpretation:  Normal sinus rhythm Normal ECG Confirmed by Fredia Sorrow [phone]) on 05/03/2018 4:42:56 PM   Radiology Dg Chest 2 View  Result Date: 05/03/2018 CLINICAL DATA:  Shortness of breath EXAM: CHEST - 2 VIEW COMPARISON:  Chest x-rays dated 04/22/2018  and 08/27/2016. FINDINGS: Heart size and mediastinal contours are stable. Lungs are clear. No pleural effusion or pneumothorax seen. No acute or  suspicious osseous finding. IMPRESSION: No active cardiopulmonary disease. No evidence of pneumonia or pulmonary edema. Electronically Signed   By: Franki Cabot M.D.   On: 05/03/2018 18:06   Ct Angio Chest Pe W/cm &/or Wo Cm  Result Date: 05/03/2018 CLINICAL DATA:  77 y/o F; increasing shortness of breath. Recent cardiac catheterization. PE suspected, high pretest prob EXAM: CT ANGIOGRAPHY CHEST WITH CONTRAST TECHNIQUE: Multidetector CT imaging of the chest was performed using the standard protocol during bolus administration of intravenous contrast. Multiplanar CT image reconstructions and MIPs were obtained to evaluate the vascular anatomy. CONTRAST:  55mL ISOVUE-370 IOPAMIDOL (ISOVUE-370) INJECTION 76% COMPARISON:  04/23/2018 CT chest. FINDINGS: Cardiovascular: Satisfactory opacification of the pulmonary arteries to the segmental level. No evidence of pulmonary embolism. Normal heart size. No pericardial effusion. Moderate to severe coronary artery and aortic calcific atherosclerosis. Mediastinum/Nodes: No enlarged mediastinal, hilar, or axillary lymph nodes. Thyroid gland, trachea, and esophagus demonstrate no significant findings. Lungs/Pleura: Mild interlobular septal thickening. Faint ground-glass opacification of the lungs with areas of sparing. Dependent right lower lobe small subsegmental consolidation. Small right pleural effusion. No pneumothorax. Upper Abdomen: No acute abnormality. Musculoskeletal: No chest wall abnormality. No acute osseous findings. Moderate multilevel discogenic degenerative changes of the thoracic spine. Review of the MIP images confirms the above findings. IMPRESSION: 1. No pulmonary embolus identified. 2. Interstitial pulmonary edema. Small right pleural effusion. 3. Dependent right lower lobe subsegmental consolidation may represent atelectasis or pneumonia. 4. Moderate to severe coronary artery and aortic calcific Electronically Signed   By: Kristine Garbe M.D.    On: 05/03/2018 19:43    Procedures Procedures (including critical care time)  Medications Ordered in ED Medications  iopamidol (ISOVUE-370) 76 % injection 75 mL (75 mLs Intravenous Contrast Given 05/03/18 1855)     Initial Impression / Assessment and Plan / ED Course  I have reviewed the triage vital signs and the nursing notes.  Pertinent labs & imaging results that were available during my care of the patient were reviewed by me and considered in my medical decision making (see chart for details).    Repeat troponin  0.05.  Point-of-care at 8 PM.   This point-of-care test results did not crossover.  Patient with persistent shortness of breath.  CT of [place] of chest without evidence of pulmonary embolism.  Raises some question of pneumonia but patient clinically without cough or fevers.  White blood cell count is borderline.  Will discuss with hospitalist.  Patient's BNP slightly elevated to CT raise some concerns of some interstitial edema.  Patient did have cardiac cath recently raise some concerns about a flap in the coronary arteries.  In recent conversation with the patient's family member there has been kind of a nonproductive cough for the past week.   Patient normally takes Lopressor twice a day.  She had her morning dose but has not had her evening dose.  Patient's systolic pressures are creeping up so patient will be given the evening dose of Lopressor 100 mg.  Hospitalist will see patient.  Keep her on oxygen and have cardiology evaluate her in the morning.  Also they will determine whether they want to go forward with either antibiotics or a little bit of Lasix.  Final Clinical Impressions(s) / ED Diagnoses   Final diagnoses:  SOB (shortness of breath)  Hypoxia    ED Discharge Orders  None       Fredia Sorrow, MD 05/03/18 2103

## 2018-05-03 NOTE — ED Notes (Signed)
CRITICAL VALUE ALERT  Critical Value:  Troponin 0.09  Date & Time Notied:  05/03/2018, 1751 Provider Notified: Dr. Rogene Houston  Orders Received/Actions taken: See chart

## 2018-05-03 NOTE — ED Triage Notes (Signed)
Patient recently seen here in ED and transported to [facility] for Cath. Per family small tear between cardia muscle and one artery had 50% blockage wall, but no stent needed to be placed. Patient discharged last Saturday and still have minor chest, but shortness of breath with exertion, weakness and numbness in both arms has increased.  Patient's O2 sat 89-90% on room. Per family, [facility] debated on sending her home on 02 but decided against it.

## 2018-05-03 NOTE — H&P (Signed)
History and Physical    Kandy Towery Baum PYP:[id number] DOB: 03-04-1941 DOA: 05/03/2018  PCP: Celene Squibb, MD   Patient coming from: Home.  I have personally briefly reviewed patient's old medical records in [place]  Chief Complaint: Chest pain.  HPI: Connie DONALSON is a 77 y.o. female with medical history significant of back pain, cat scratch her right forearm, impaired hearing, history of hemorrhoids, urolithiasis, history of DDD, hyperlipidemia, hypertension, migraine headaches, osteoarthritis, history of mini stroke, recently discharged from [facility] due to NSTEMI who is coming to the emergency department with complaints of dyspnea on exertion since she got home from the hospital.  She states that when she got back from the hospital she tried to go up the stairs and developed significant dyspnea.  She had to sit down for a while to catch her breath.  This has happened several times since then and is associated with occasional pressure-like chest pain, mild dizziness and palpitations, she has had productive cough with foamy, whitish looking sputum, but denies nausea, emesis, diaphoresis, PND or recent pitting edema of the lower extremities but states that she has been using 2 pillows now to be able to breathe comfortably in bed.  She denies fever, chills, sore throat, wheezing, hemoptysis, abdominal pain, diarrhea, constipation, melena or hematochezia.  No dysuria, frequency or hematuria.  Denies polyuria, polydipsia, polyphagia and blurry vision.  ED Course: Initial vital signs temperature 98 F, pulse 63, respirations 20, blood pressure 130/73 mmHg and O2 sat 87% on room air.  I held the patient's evening dose of metoprolol, ordered furosemide 20 mg IVP x1 along with potassium and magnesium supplementation.  Her EKG is normal sinus rhythm. Troponin was 0.09 ng/mL.  BNP was 393.0 pg/mL.  Her white count was 10.0, hemoglobin 11.1 g/dL and platelets 255. BMP shows a glucose of  118 and calcium of 8.8 mg/dL, which is normal when corrected to an albumin of 3.7 g/dL.  All other electrolytes are within normal limits.  Renal function is normal.  LFTs results are unremarkable.  Imaging: Chest radiograph was normal.  CTA chest PE protocol did not demonstrate any embolism of the lungs.  However, there was interstitial pulmonary edema with a small right pleural effusion.  Dependent right lower lobe subsegmental consolidation may be atelectasis versus pneumonia.  Review of Systems: As per HPI otherwise 10 point review of systems negative.   Past Medical History:  Diagnosis Date  . Back pain   . Cat scratch of right forearm 02/03/2014  . Hard of hearing   . Hemorrhoid 04/06/2014  . History of kidney stones   . Hx of degenerative disc disease   . Hypercholesterolemia   . Hypertension   . Migraines   . Osteoarthritis   . Stroke [facility])    mini stroke, resolved in 30 minutes    Past Surgical History:  Procedure Laterality Date  . ABDOMINAL HYSTERECTOMY    . ANTERIOR AND POSTERIOR REPAIR    . APPENDECTOMY    . BILATERAL SALPINGOOPHORECTOMY    . BREAST SURGERY    . CHOLECYSTECTOMY    . colonoscopy  2003   Dr. Gala Romney: normal rectum, scattered diverticula  . COLONOSCOPY WITH PROPOFOL N/A 01/01/2016   Procedure: COLONOSCOPY WITH PROPOFOL;  Surgeon: Daneil Dolin, MD;  Location: [facility];  Service: Endoscopy;  Laterality: N/A;  1015  . ERCP with sphincterotomy  2003   Dr. Gala Romney: balloon dredging of bile duct, normal ampulla, choledocholithiasis   . EUS  2016   Dr. Amalia Hailey: subtle 13X17 mm heterogenous lesion in pancreas neck, FNA negative for malignancy   . LEFT HEART CATH AND CORONARY ANGIOGRAPHY N/A 04/23/2018   Procedure: LEFT HEART CATH AND CORONARY ANGIOGRAPHY;  Surgeon: Belva Crome, MD;  Location: [facility] LAB;  Service: Cardiovascular;  Laterality: N/A;  . POLYPECTOMY  01/01/2016   Procedure: POLYPECTOMY;  Surgeon: Daneil Dolin, MD;  Location: [facility];  Service: Endoscopy;;   polypectomies x2-splenic flexure and cecal  . TONSILLECTOMY       reports that she has quit smoking. Her smoking use included cigarettes. She has a 50.00 pack-year smoking history. She has never used smokeless tobacco. She reports that she does not drink alcohol or use drugs.  Allergies  Allergen Reactions  . Adhesive [Tape] Other (See Comments)    Tears skin  . Doxycycline Nausea And Vomiting  . Latex Itching and Rash    Family History  Problem Relation Age of Onset  . Heart disease Mother   . Cancer Father        liver  . Cancer Son   . Heart disease Son   . Cancer Maternal Grandmother   . Colon cancer Neg Hx    Prior to Admission medications   Medication Sig Start Date End Date Taking? Authorizing Provider  acetaminophen (TYLENOL) 500 MG tablet Take 2 tablets (1,000 mg total) by mouth every 8 (eight) hours as needed for mild pain, moderate pain or headache. 04/25/18  Yes Hongalgi, Lenis Dickinson, MD  albuterol (PROVENTIL HFA;VENTOLIN HFA) 108 (90 Base) MCG/ACT inhaler Inhale 2 puffs into the lungs every 6 (six) hours as needed for wheezing or shortness of breath. 04/25/18  Yes Hongalgi, Lenis Dickinson, MD  aspirin 81 MG chewable tablet Chew 1 tablet (81 mg total) by mouth daily. Patient taking differently: Chew 81 mg by mouth daily as needed for mild pain or moderate pain.  04/25/18  Yes Hongalgi, Lenis Dickinson, MD  AZOR 10-40 MG per tablet Take 1 tablet by mouth daily. Reported on 12/04/2015 07/06/13  Yes [provider]  Calcium Citrate (CITRACAL PO) Take 1 tablet by mouth daily.    Yes [provider]  Cholecalciferol (VITAMIN D PO) Take 1 tablet by mouth daily.    Yes [provider]  HYDROcodone-acetaminophen (NORCO) 10-325 MG tablet Take 1 tablet by mouth 3 (three) times daily.    Yes [provider]  loperamide (IMODIUM) 2 MG capsule Take 4 mg by mouth as needed for diarrhea or loose stools.   Yes [provider]    ofloxacin (OCUFLOX) 0.3 % ophthalmic solution Place 2 drops into the right ear daily as needed (for pain). 2 drops in right ear daily prn. 01/14/14  Yes [provider]  umeclidinium-vilanterol (ANORO ELLIPTA) 62.5-25 MCG/INH AEPB Inhale 1 puff into the lungs daily. 04/25/18  Yes Modena Jansky, MD  venlafaxine XR (EFFEXOR-XR) 75 MG 24 hr capsule Take 150 mg by mouth daily with breakfast.  02/02/16  Yes [provider]  atorvastatin (LIPITOR) 80 MG tablet Take 1 tablet (80 mg total) by mouth daily at 6 PM. 04/25/18   Hongalgi, Lenis Dickinson, MD  clopidogrel (PLAVIX) 75 MG tablet Take 1 tablet (75 mg total) by mouth daily. 04/26/18   Hongalgi, Lenis Dickinson, MD  metoprolol tartrate (LOPRESSOR) 100 MG tablet Take 1 tablet (100 mg total) by mouth 2 (two) times daily. 04/25/18   Hongalgi, Lenis Dickinson, MD  omeprazole (PRILOSEC) 20 MG capsule Take  20 mg by mouth daily.    [provider]  PARoxetine Mesylate 7.5 MG CAPS Take 7.5 mg by mouth at bedtime. 03/24/18   [provider]  Trospium Chloride 60 MG CP24 Take 60 mg by mouth daily.    [provider]    Physical Exam: Vitals:   05/03/18 1749 05/03/18 1800 05/03/18 1830 05/03/18 2045  BP:  (!) 152/70 (!) 158/82   Pulse: 63 63 66   Resp: 16 16 20    Temp: 98 F (36.7 C)     TempSrc: Oral     SpO2: 95% 95% 93% 93%  Weight:      Height:       Constitutional: NAD, calm, comfortable Eyes: PERRL, lids and conjunctivae normal ENMT: Mucous membranes are moist. Posterior pharynx clear of any exudate or lesions. Neck: normal, supple, no masses, no thyromegaly Respiratory: Bibasilar rales, transient rhonchi, no wheezing. Normal respiratory effort. No accessory muscle use.  Cardiovascular: Bradycardic at 54 bpm, no murmurs / rubs / gallops. No extremity edema. 2+ pedal pulses. No carotid bruits.  Abdomen: Soft, no tenderness, no masses palpated. No hepatosplenomegaly. Bowel sounds positive.  Musculoskeletal: no clubbing /  cyanosis. Good ROM, no contractures. Normal muscle tone.  Skin: Positive areas of ecchymosis on forearms. See pictures below Neurologic: CN 2-12 grossly intact. Sensation intact, DTR normal. Strength 5/5 in all 4.  Psychiatric: Normal judgment and insight. Alert and oriented x 3. Normal mood.          Labs on Admission: I have personally reviewed following labs and imaging studies  CBC: Recent Labs  Lab 05/03/18 1703  WBC 10.0  HGB 11.1*  HCT 35.1*  MCV 100.0  PLT 627   Basic Metabolic Panel: Recent Labs  Lab 05/03/18 1703  NA 137  K 3.5  CL 106  CO2 23  GLUCOSE 118*  BUN 16  CREATININE 0.88  CALCIUM 8.8*   GFR: Estimated Creatinine Clearance: 48.2 mL/min (by C-G formula based on SCr of 0.88 mg/dL). Liver Function Tests: No results for input(s): AST, ALT, ALKPHOS, BILITOT, PROT, ALBUMIN in the last 168 hours. No results for input(s): LIPASE, AMYLASE in the last 168 hours. No results for input(s): AMMONIA in the last 168 hours. Coagulation Profile: No results for input(s): INR, PROTIME in the last 168 hours. Cardiac Enzymes: Recent Labs  Lab 05/03/18 1703  TROPONINI 0.09*   BNP (last 3 results) No results for input(s): PROBNP in the last 8760 hours. HbA1C: No results for input(s): HGBA1C in the last 72 hours. CBG: No results for input(s): GLUCAP in the last 168 hours. Lipid Profile: No results for input(s): CHOL, HDL, LDLCALC, TRIG, CHOLHDL, LDLDIRECT in the last 72 hours. Thyroid Function Tests: No results for input(s): TSH, T4TOTAL, FREET4, T3FREE, THYROIDAB in the last 72 hours. Anemia Panel: No results for input(s): VITAMINB12, FOLATE, FERRITIN, TIBC, IRON, RETICCTPCT in the last 72 hours. Urine analysis:    Component Value Date/Time   COLORURINE YELLOW 04/23/2018 0110   APPEARANCEUR HAZY (A) 04/23/2018 0110   LABSPEC 1.016 04/23/2018 0110   PHURINE 6.0 04/23/2018 0110   GLUCOSEU NEGATIVE 04/23/2018 0110   HGBUR NEGATIVE 04/23/2018 0110    BILIRUBINUR NEGATIVE 04/23/2018 0110   KETONESUR NEGATIVE 04/23/2018 0110   PROTEINUR 30 (A) 04/23/2018 0110   UROBILINOGEN 0.2 11/30/2014 1221   NITRITE NEGATIVE 04/23/2018 0110   LEUKOCYTESUR TRACE (A) 04/23/2018 0110    Radiological Exams on Admission: Dg Chest 2 View  Result Date: 05/03/2018 CLINICAL DATA:  Shortness  of breath EXAM: CHEST - 2 VIEW COMPARISON:  Chest x-rays dated 04/22/2018 and 08/27/2016. FINDINGS: Heart size and mediastinal contours are stable. Lungs are clear. No pleural effusion or pneumothorax seen. No acute or suspicious osseous finding. IMPRESSION: No active cardiopulmonary disease. No evidence of pneumonia or pulmonary edema. Electronically Signed   By: Franki Cabot M.D.   On: 05/03/2018 18:06   Ct Angio Chest Pe W/cm &/or Wo Cm  Result Date: 05/03/2018 CLINICAL DATA:  77 y/o F; increasing shortness of breath. Recent cardiac catheterization. PE suspected, high pretest prob EXAM: CT ANGIOGRAPHY CHEST WITH CONTRAST TECHNIQUE: Multidetector CT imaging of the chest was performed using the standard protocol during bolus administration of intravenous contrast. Multiplanar CT image reconstructions and MIPs were obtained to evaluate the vascular anatomy. CONTRAST:  4mL ISOVUE-370 IOPAMIDOL (ISOVUE-370) INJECTION 76% COMPARISON:  04/23/2018 CT chest. FINDINGS: Cardiovascular: Satisfactory opacification of the pulmonary arteries to the segmental level. No evidence of pulmonary embolism. Normal heart size. No pericardial effusion. Moderate to severe coronary artery and aortic calcific atherosclerosis. Mediastinum/Nodes: No enlarged mediastinal, hilar, or axillary lymph nodes. Thyroid gland, trachea, and esophagus demonstrate no significant findings. Lungs/Pleura: Mild interlobular septal thickening. Faint ground-glass opacification of the lungs with areas of sparing. Dependent right lower lobe small subsegmental consolidation. Small right pleural effusion. No pneumothorax. Upper  Abdomen: No acute abnormality. Musculoskeletal: No chest wall abnormality. No acute osseous findings. Moderate multilevel discogenic degenerative changes of the thoracic spine. Review of the MIP images confirms the above findings. IMPRESSION: 1. No pulmonary embolus identified. 2. Interstitial pulmonary edema. Small right pleural effusion. 3. Dependent right lower lobe subsegmental consolidation may represent atelectasis or pneumonia. 4. Moderate to severe coronary artery and aortic calcific Electronically Signed   By: Kristine Garbe M.D.   On: 05/03/2018 19:43   10/07/2016 echo ------------------------------------------------------------------- LV EF: 60% -   65%  ------------------------------------------------------------------- Indications:      Lower Extremity Edema.  ------------------------------------------------------------------- History:   Risk factors:  Hypercholesteremia. Hypertension.  ------------------------------------------------------------------- Study Conclusions  - Left ventricle: The cavity size was normal. Severe asymmetric   septal hypertrophy. There is no SAM or dynamic gradient. Systolic   function was normal. The estimated ejection fraction was in the   range of 60% to 65%. Wall motion was normal; there were no   regional wall motion abnormalities. Doppler parameters are   consistent with abnormal left ventricular relaxation (grade 1   diastolic dysfunction). LA pressure is indeterminant. - Aortic valve: Moderately calcified annulus. Trileaflet;   moderately thickened leaflets. Valve area (VTI): 1.82 cm^2. Valve   area (Vmax): 1.8 cm^2. Valve area (Vmean): 1.89 cm^2. - Pulmonary arteries: Systolic pressure was mildly to moderately   increased. PA peak pressure: 39 mm Hg (S). - Systemic veins: IVC is dilated with normal respiratory variation,   estimated RA pressure is 8 mmHg. - Technically adequate study.  EKG: Independently reviewed.  Vent.  rate 67 BPM PR interval 118 ms QRS duration 86 ms QT/QTc 420/443 ms P-R-T axes 50 65 74 Normal sinus rhythm Normal ECG  Assessment/Plan Principal Problem:   Dyspnea There is some vascular congestion on lungs, but no edema. The patient has been using 2 pillows to sleep. The patient has been complaining of significant fatigue. I believe that her symptoms have probably worsened due to up titration of beta-blocker, since she has gone up from 12.5 mg p.o. twice daily to 100 mg p.o. twice daily in less than a week. Observation/telemetry. Continue supplemental oxygen. Single dose of DuoNeb given.  Furosemide 20 mg IVP x1 dose. Supplement magnesium and potassium. Continue troponin level trending. Hold metoprolol for now and resume at a lower dose. Check echocardiogram in a.m. Consult cardiology in the morning.  Active Problems:   Elevated troponin May be residual from recent NSTEMI. Continue treatment as above. Trend level. Cardiac telemetry. Check echocardiogram.    CAD (coronary artery disease) Hold beta-blocker for 1 dose. Continue atorvastatin, aspirin and Plavix. Resume at a lower dose and titrate up at a slower pace.    Chronic bronchitis (HCC) Continue supplemental oxygen. Bronchodilators as needed.    Hypertension Continue Azor 10-40 mg p.o. daily. Resume beta-blocker at a lower dose. Monitor BP, HR, renal function and electrolytes.    Hypercholesteremia Continue atorvastatin 80 mg p.o. daily. Monitor LFTs.    Anemia Monitor hematocrit and hemoglobin    DVT prophylaxis: SQ Lovenox. Code Status: Full code. Family Communication: Disposition Plan: Saturation for troponin level trending. Consults called: Routine cardiology consult. Admission status: Observation/telemetry.   Reubin Milan MD [facility] Pager [phone].  If 7PM-7AM, please contact night-coverage [email or web address] Password [email or web address]  05/03/2018, 10:26 PM

## 2018-05-04 ENCOUNTER — Observation Stay (HOSPITAL_BASED_OUTPATIENT_CLINIC_OR_DEPARTMENT_OTHER): Payer: Medicare Other

## 2018-05-04 ENCOUNTER — Observation Stay (HOSPITAL_COMMUNITY): Payer: Medicare Other

## 2018-05-04 ENCOUNTER — Encounter (HOSPITAL_COMMUNITY): Payer: Self-pay

## 2018-05-04 DIAGNOSIS — I10 Essential (primary) hypertension: Secondary | ICD-10-CM | POA: Diagnosis not present

## 2018-05-04 DIAGNOSIS — J42 Unspecified chronic bronchitis: Secondary | ICD-10-CM

## 2018-05-04 DIAGNOSIS — I25118 Atherosclerotic heart disease of native coronary artery with other forms of angina pectoris: Secondary | ICD-10-CM | POA: Diagnosis not present

## 2018-05-04 DIAGNOSIS — I361 Nonrheumatic tricuspid (valve) insufficiency: Secondary | ICD-10-CM | POA: Diagnosis not present

## 2018-05-04 DIAGNOSIS — R0609 Other forms of dyspnea: Secondary | ICD-10-CM | POA: Diagnosis not present

## 2018-05-04 DIAGNOSIS — R7989 Other specified abnormal findings of blood chemistry: Secondary | ICD-10-CM | POA: Diagnosis not present

## 2018-05-04 DIAGNOSIS — I214 Non-ST elevation (NSTEMI) myocardial infarction: Secondary | ICD-10-CM

## 2018-05-04 DIAGNOSIS — I509 Heart failure, unspecified: Secondary | ICD-10-CM

## 2018-05-04 DIAGNOSIS — E785 Hyperlipidemia, unspecified: Secondary | ICD-10-CM

## 2018-05-04 LAB — BASIC METABOLIC PANEL
Anion gap: 8 (ref 5–15)
BUN: 12 mg/dL (ref 8–23)
CO2: 27 mmol/L (ref 22–32)
Calcium: 8.4 mg/dL — ABNORMAL LOW (ref 8.9–10.3)
Chloride: 105 mmol/L (ref 98–111)
Creatinine, Ser: 0.72 mg/dL (ref 0.44–1.00)
GFR calc Af Amer: >60 mL/min (ref >60)
GLUCOSE: 101 mg/dL — AB (ref 70–99)
Potassium: 4.1 mmol/L (ref 3.5–5.1)
Sodium: 140 mmol/L (ref 135–145)

## 2018-05-04 LAB — ECHOCARDIOGRAM COMPLETE
HEIGHTINCHES: 63 in
Weight: 2256 oz

## 2018-05-04 LAB — TROPONIN I: TROPONIN I: 0.08 ng/mL — AB (ref <0.03)

## 2018-05-04 MED ORDER — AZITHROMYCIN 250 MG PO TABS
500.0000 mg | ORAL_TABLET | Freq: Every day | ORAL | Status: DC
  Administered 2018-05-05: 500 mg via ORAL
  Filled 2018-05-04: qty 2

## 2018-05-04 MED ORDER — IRBESARTAN 300 MG PO TABS
300.0000 mg | ORAL_TABLET | Freq: Every day | ORAL | Status: DC
  Administered 2018-05-04 – 2018-05-05 (×2): 300 mg via ORAL
  Filled 2018-05-04 (×2): qty 1

## 2018-05-04 MED ORDER — AMLODIPINE BESYLATE 5 MG PO TABS
10.0000 mg | ORAL_TABLET | Freq: Every day | ORAL | Status: DC
  Administered 2018-05-05: 10 mg via ORAL
  Filled 2018-05-04 (×2): qty 2

## 2018-05-04 MED ORDER — METOPROLOL TARTRATE 50 MG PO TABS
100.0000 mg | ORAL_TABLET | Freq: Two times a day (BID) | ORAL | Status: DC
  Administered 2018-05-04 – 2018-05-05 (×3): 100 mg via ORAL
  Filled 2018-05-04 (×3): qty 2

## 2018-05-04 NOTE — Progress Notes (Signed)
PROGRESS NOTE    Connie Wood  OEV:[id number] DOB: 06-Jun-1941 DOA: 05/03/2018 PCP: Celene Squibb, MD  Brief Narrative:77 y.o. female with medical history significant of back pain, cat scratch her right forearm, impaired hearing, history of hemorrhoids, urolithiasis, history of DDD, hyperlipidemia, hypertension, migraine headaches, osteoarthritis, history of mini stroke, recently discharged from [facility] due to NSTEMI who is coming to the emergency department with complaints of dyspnea on exertion since she got home from the hospital.  She states that when she got back from the hospital she tried to go up the stairs and developed significant dyspnea.  She had to sit down for a while to catch her breath.  This has happened several times since then and is associated with occasional pressure-like chest pain, mild dizziness and palpitations, she has had productive cough with foamy, whitish looking sputum, but denies nausea, emesis, diaphoresis, PND or recent pitting edema of the lower extremities but states that she has been using 2 pillows now to be able to breathe comfortably in bed.  She denies fever, chills, sore throat, wheezing, hemoptysis, abdominal pain, diarrhea, constipation, melena or hematochezia.  No dysuria, frequency or hematuria.  Denies polyuria, polydipsia, polyphagia and blurry vision.  ED Course: Initial vital signs temperature 98 F, pulse 63, respirations 20, blood pressure 130/73 mmHg and O2 sat 87% on room air.  I held the patient's evening dose of metoprolol, ordered furosemide 20 mg IVP x1 along with potassium and magnesium supplementation.  Her EKG is normal sinus rhythm. Troponin was 0.09 ng/mL.  BNP was 393.0 pg/mL.  Her white count was 10.0, hemoglobin 11.1 g/dL and platelets 255. BMP shows a glucose of 118 and calcium of 8.8 mg/dL, which is normal when corrected to an albumin of 3.7 g/dL.  All other electrolytes are within normal limits.  Renal function is normal.  LFTs  results are unremarkable. Imaging: Chest radiograph was normal.  CTA chest PE protocol did not demonstrate any embolism of the lungs.  However, there was interstitial pulmonary edema with a small right pleural effusion.  Dependent right lower lobe subsegmental consolidation may be atelectasis versus pneumonia. Assessment & Plan:   Principal Problem:   Dyspnea Active Problems:   Chronic bronchitis (HCC)   Hypertension   Hypercholesteremia   CAD (coronary artery disease)   Elevated troponin   Anemia  CHF DIASTOLIC-EF NORMAL NO WMA.appreciate cardiology input.increase activity.she is on 2 lit of oxygen.does not have oxygen at home.Daily weights I/o    CAD (coronary artery disease)s/p recent NSTEMI10/19 Continue atorvastatin, aspirin and Plavix.    Chronic bronchitis (HCC) Continue supplemental oxygen. Bronchodilators as needed. Start z pack   Hypertension-continue current meds    Hypercholesteremia Continue atorvastatin 80 mg p.o. daily. Monitor LFTs.    Anemia Monitor hematocrit and hemoglobin     Estimated body mass index is 24.98 kg/m as calculated from the following:   Height as of this encounter: 5\' 3"  (1.6 m).   Weight as of this encounter: 64 kg.  DVT prophylaxis: lovenox Code Status: full Family Communicationnone Disposition Plan: per cardiolgy  Consultants:cardiology Procedures none  Antimicrobials: z pack   Subjective:complains os sob doe too tired to walk on 2 lit oxygen does not have oxygen at home no chest pain   Objective: Vitals:   05/04/18 0515 05/04/18 0530 05/04/18 0919 05/04/18 0944  BP: (!) 157/81  134/78   Pulse:   78 78  Resp:  18 20   Temp:      TempSrc:  SpO2:   94%   Weight:      Height:        Intake/Output Summary (Last 24 hours) at 05/04/2018 1531 Last data filed at 05/04/2018 0930 Gross per 24 hour  Intake 410 ml  Output -  Net 410 ml   Filed Weights   05/03/18 1624  Weight: 64 kg     Examination:  General exam: Appears calm and comfortable  Respiratory system: decreased breath sounds bases with rhonchi auscultation. Respiratory effort normal. Cardiovascular system: S1 & S2 heard, RRR. No JVD, murmurs, rubs, gallops or clicks. No pedal edema. Gastrointestinal system: Abdomen is nondistended, soft and nontender. No organomegaly or masses felt. Normal bowel sounds heard. Central nervous system: Alert and oriented. No focal neurological deficits. Extremities: Symmetric 5 x 5 power. Skin: No rashes, lesions or ulcers Psychiatry: Judgement and insight appear normal. Mood & affect appropriate.     Data Reviewed: I have personally reviewed following labs and imaging studies  CBC: Recent Labs  Lab 05/03/18 1703  WBC 10.0  HGB 11.1*  HCT 35.1*  MCV 100.0  PLT 623   Basic Metabolic Panel: Recent Labs  Lab 05/03/18 1703 05/04/18 0431  NA 137 140  K 3.5 4.1  CL 106 105  CO2 23 27  GLUCOSE 118* 101*  BUN 16 12  CREATININE 0.88 0.72  CALCIUM 8.8* 8.4*  MG 1.8  --   PHOS 3.5  --    GFR: Estimated Creatinine Clearance: 53 mL/min (by C-G formula based on SCr of 0.72 mg/dL). Liver Function Tests: Recent Labs  Lab 05/03/18 2249  AST 25  ALT 18  ALKPHOS 93  BILITOT 1.0  PROT 6.8  ALBUMIN 3.7   No results for input(s): LIPASE, AMYLASE in the last 168 hours. No results for input(s): AMMONIA in the last 168 hours. Coagulation Profile: No results for input(s): INR, PROTIME in the last 168 hours. Cardiac Enzymes: Recent Labs  Lab 05/03/18 1703 05/03/18 2249 05/04/18 0431  TROPONINI 0.09* 0.09* 0.08*   BNP (last 3 results) No results for input(s): PROBNP in the last 8760 hours. HbA1C: No results for input(s): HGBA1C in the last 72 hours. CBG: No results for input(s): GLUCAP in the last 168 hours. Lipid Profile: No results for input(s): CHOL, HDL, LDLCALC, TRIG, CHOLHDL, LDLDIRECT in the last 72 hours. Thyroid Function Tests: No results for  input(s): TSH, T4TOTAL, FREET4, T3FREE, THYROIDAB in the last 72 hours. Anemia Panel: No results for input(s): VITAMINB12, FOLATE, FERRITIN, TIBC, IRON, RETICCTPCT in the last 72 hours. Sepsis Labs: No results for input(s): PROCALCITON, LATICACIDVEN in the last 168 hours.  No results found for this or any previous visit (from the past 240 hour(s)).       Radiology Studies: Dg Chest 2 View  Result Date: 05/03/2018 CLINICAL DATA:  Shortness of breath EXAM: CHEST - 2 VIEW COMPARISON:  Chest x-rays dated 04/22/2018 and 08/27/2016. FINDINGS: Heart size and mediastinal contours are stable. Lungs are clear. No pleural effusion or pneumothorax seen. No acute or suspicious osseous finding. IMPRESSION: No active cardiopulmonary disease. No evidence of pneumonia or pulmonary edema. Electronically Signed   By: Franki Cabot M.D.   On: 05/03/2018 18:06   Ct Angio Chest Pe W/cm &/or Wo Cm  Result Date: 05/03/2018 CLINICAL DATA:  77 y/o F; increasing shortness of breath. Recent cardiac catheterization. PE suspected, high pretest prob EXAM: CT ANGIOGRAPHY CHEST WITH CONTRAST TECHNIQUE: Multidetector CT imaging of the chest was performed using the standard protocol during bolus administration  of intravenous contrast. Multiplanar CT image reconstructions and MIPs were obtained to evaluate the vascular anatomy. CONTRAST:  70mL ISOVUE-370 IOPAMIDOL (ISOVUE-370) INJECTION 76% COMPARISON:  04/23/2018 CT chest. FINDINGS: Cardiovascular: Satisfactory opacification of the pulmonary arteries to the segmental level. No evidence of pulmonary embolism. Normal heart size. No pericardial effusion. Moderate to severe coronary artery and aortic calcific atherosclerosis. Mediastinum/Nodes: No enlarged mediastinal, hilar, or axillary lymph nodes. Thyroid gland, trachea, and esophagus demonstrate no significant findings. Lungs/Pleura: Mild interlobular septal thickening. Faint ground-glass opacification of the lungs with areas of  sparing. Dependent right lower lobe small subsegmental consolidation. Small right pleural effusion. No pneumothorax. Upper Abdomen: No acute abnormality. Musculoskeletal: No chest wall abnormality. No acute osseous findings. Moderate multilevel discogenic degenerative changes of the thoracic spine. Review of the MIP images confirms the above findings. IMPRESSION: 1. No pulmonary embolus identified. 2. Interstitial pulmonary edema. Small right pleural effusion. 3. Dependent right lower lobe subsegmental consolidation may represent atelectasis or pneumonia. 4. Moderate to severe coronary artery and aortic calcific Electronically Signed   By: Kristine Garbe M.D.   On: 05/03/2018 19:43        Scheduled Meds: . amLODipine  10 mg Oral Daily   Or  . irbesartan  300 mg Oral Daily  . aspirin  81 mg Oral Daily  . atorvastatin  80 mg Oral q1800  . clopidogrel  75 mg Oral Daily  . enoxaparin (LOVENOX) injection  40 mg Subcutaneous Q24H  . HYDROcodone-acetaminophen  1 tablet Oral TID  . metoprolol tartrate  100 mg Oral BID  . pantoprazole  40 mg Oral Daily  . umeclidinium-vilanterol  1 puff Inhalation Daily  . venlafaxine XR  150 mg Oral Q breakfast   Continuous Infusions:   LOS: 0 days     Georgette Shell, MD [facility]  If 7PM-7AM, please contact night-coverage [email or web address] Password [email or web address] 05/04/2018, 3:31 PM

## 2018-05-04 NOTE — Care Management Obs Status (Signed)
[place] NOTIFICATION   Patient Details  Name: Connie Wood MRN: [id number] Date of Birth: 07-21-1940   Medicare Observation Status Notification Given:  Yes    Contessa Preuss, Chauncey Reading, RN 05/04/2018, 2:07 PM

## 2018-05-04 NOTE — Consult Note (Addendum)
Cardiology Consultation:   Patient ID: YITTY ROADS MRN: [id number]; DOB: Apr 28, 1941  Admit date: 05/03/2018 Date of Consult: 05/04/2018  Primary Care Provider: Celene Squibb, MD Primary Cardiologist: Jenkins Rouge, MD  Primary Electrophysiologist:  None    Patient Profile:   Connie Wood is a 77 y.o. female with a hx of CAD status post NSTEMI 04/23/2018 who is being seen today for the evaluation of chest pain & shortness of breath at the request of Dr. Jeannetta Nap.  History of Present Illness:   Connie Wood is a 77 year old female patient with history of hypertension, previous stroke, former smoker, chronic bronchitis and HLD who presented to the hospital 04/23/2018 with NSTEMI peak troponin of 0.74.  Cardiac catheterization showed possible LAD dissection with eccentric hazy up to 50% proximal to mid LAD, TIMI-3 flow was noted.  With poor quality LV gram with upper normal LVEDP 16-19 and no akinetic or severely hypokinetic segments noted.  Recommendation was for Plavix, uptitrate beta-blocker and monitor course.  If significant chest pain would consider restudy.  Patient was admitted with significant dyspnea on exertion with little activity, some chest pressure down both arms and productive cough.  BNP 393 troponin 0 0.09.  She was treated with IV Lasix 20 mg x 1 and metoprolol was held.  CTA of the chest was negative for PE however there was interstitial pulmonary edema and small right pleural effusion.  Also consideration of atelectasis versus pneumonia. She says she was up all night urinating. Breathing better on O2. No further chest pain but still coughing.   Past Medical History:  Diagnosis Date  . Back pain   . Cat scratch of right forearm 02/03/2014  . Dyspnea   . Hard of hearing   . Hemorrhoid 04/06/2014  . History of kidney stones   . Hx of degenerative disc disease   . Hypercholesterolemia   . Hypertension   . Migraines   . Osteoarthritis   . Stroke [facility])      mini stroke, resolved in 30 minutes    Past Surgical History:  Procedure Laterality Date  . ABDOMINAL HYSTERECTOMY    . ANTERIOR AND POSTERIOR REPAIR    . APPENDECTOMY    . BILATERAL SALPINGOOPHORECTOMY    . BREAST SURGERY    . CARDIAC CATHETERIZATION    . CHOLECYSTECTOMY    . colonoscopy  2003   Dr. Gala Romney: normal rectum, scattered diverticula  . COLONOSCOPY WITH PROPOFOL N/A 01/01/2016   Procedure: COLONOSCOPY WITH PROPOFOL;  Surgeon: Daneil Dolin, MD;  [facility];  Service: Endoscopy;  Laterality: N/A;  1015  . ERCP with sphincterotomy  2003   Dr. Gala Romney: balloon dredging of bile duct, normal ampulla, choledocholithiasis   . EUS  2016   Dr. Amalia Hailey: subtle 13X17 mm heterogenous lesion in pancreas neck, FNA negative for malignancy   . LEFT HEART CATH AND CORONARY ANGIOGRAPHY N/A 04/23/2018   Procedure: LEFT HEART CATH AND CORONARY ANGIOGRAPHY;  Surgeon: Belva Crome, MD;  Location: [place] CV LAB;  Service: Cardiovascular;  Laterality: N/A;  . POLYPECTOMY  01/01/2016   Procedure: POLYPECTOMY;  Surgeon: Daneil Dolin, MD;  Location: [facility];  Service: Endoscopy;;   polypectomies x2-splenic flexure and cecal  . TONSILLECTOMY       Home Medications:  Prior to Admission medications   Medication Sig Start Date End Date Taking? Authorizing Provider  acetaminophen (TYLENOL) 500 MG tablet Take 2 tablets (1,000 mg total) by mouth every 8 (eight) hours  as needed for mild pain, moderate pain or headache. 04/25/18  Yes Hongalgi, Lenis Dickinson, MD  albuterol (PROVENTIL HFA;VENTOLIN HFA) 108 (90 Base) MCG/ACT inhaler Inhale 2 puffs into the lungs every 6 (six) hours as needed for wheezing or shortness of breath. 04/25/18  Yes Hongalgi, Lenis Dickinson, MD  aspirin 81 MG chewable tablet Chew 1 tablet (81 mg total) by mouth daily. Patient taking differently: Chew 81 mg by mouth daily as needed for mild pain or moderate pain.  04/25/18  Yes Hongalgi, Lenis Dickinson, MD  AZOR 10-40 MG per tablet  Take 1 tablet by mouth daily. Reported on 12/04/2015 07/06/13  Yes [provider]  Calcium Citrate (CITRACAL PO) Take 1 tablet by mouth daily.    Yes [provider]  Cholecalciferol (VITAMIN D PO) Take 1 tablet by mouth daily.    Yes [provider]  HYDROcodone-acetaminophen (NORCO) 10-325 MG tablet Take 1 tablet by mouth 3 (three) times daily.    Yes [provider]  loperamide (IMODIUM) 2 MG capsule Take 4 mg by mouth as needed for diarrhea or loose stools.   Yes [provider]  ofloxacin (OCUFLOX) 0.3 % ophthalmic solution Place 2 drops into the right ear daily as needed (for pain). 2 drops in right ear daily prn. 01/14/14  Yes [provider]  umeclidinium-vilanterol (ANORO ELLIPTA) 62.5-25 MCG/INH AEPB Inhale 1 puff into the lungs daily. 04/25/18  Yes Modena Jansky, MD  venlafaxine XR (EFFEXOR-XR) 75 MG 24 hr capsule Take 150 mg by mouth daily with breakfast.  02/02/16  Yes [provider]  atorvastatin (LIPITOR) 80 MG tablet Take 1 tablet (80 mg total) by mouth daily at 6 PM. 04/25/18   Hongalgi, Lenis Dickinson, MD  clopidogrel (PLAVIX) 75 MG tablet Take 1 tablet (75 mg total) by mouth daily. 04/26/18   Hongalgi, Lenis Dickinson, MD  metoprolol tartrate (LOPRESSOR) 100 MG tablet Take 1 tablet (100 mg total) by mouth 2 (two) times daily. 04/25/18   Hongalgi, Lenis Dickinson, MD  omeprazole (PRILOSEC) 20 MG capsule Take 20 mg by mouth daily.    [provider]  PARoxetine Mesylate 7.5 MG CAPS Take 7.5 mg by mouth at bedtime. 03/24/18   [provider]  Trospium Chloride 60 MG CP24 Take 60 mg by mouth daily.    [provider]    Inpatient Medications: Scheduled Meds: . amLODipine  10 mg Oral Daily   Or  . irbesartan  300 mg Oral Daily  . aspirin  81 mg Oral Daily  . atorvastatin  80 mg Oral q1800  . clopidogrel  75 mg Oral Daily  . enoxaparin (LOVENOX) injection  40 mg Subcutaneous Q24H  . HYDROcodone-acetaminophen  1  tablet Oral TID  . pantoprazole  40 mg Oral Daily  . umeclidinium-vilanterol  1 puff Inhalation Daily  . venlafaxine XR  150 mg Oral Q breakfast   Continuous Infusions:  PRN Meds: acetaminophen **OR** acetaminophen, loperamide, ofloxacin, ondansetron **OR** ondansetron (ZOFRAN) IV  Allergies:    Allergies  Allergen Reactions  . Adhesive [Tape] Other (See Comments)    Tears skin  . Doxycycline Nausea And Vomiting  . Latex Itching and Rash    Social History:   Social History   Socioeconomic History  . Marital status: Widowed    Spouse name: Not on file  . Number of children: Not on file  . Years of education: Not on file  . Highest education level: Not on file  Occupational History  . Not  on file  Social Needs  . Financial resource strain: Not on file  . Food insecurity:    Worry: Not on file    Inability: Not on file  . Transportation needs:    Medical: Not on file    Non-medical: Not on file  Tobacco Use  . Smoking status: Former Smoker    Packs/day: 1.00    Years: 50.00    Pack years: 50.00    Types: Cigarettes  . Smokeless tobacco: Never Used  Substance and Sexual Activity  . Alcohol use: No    Alcohol/week: 0.0 standard drinks  . Drug use: No  . Sexual activity: Not Currently    Birth control/protection: Post-menopausal, Surgical    Comment: hyst  Lifestyle  . Physical activity:    Days per week: Not on file    Minutes per session: Not on file  . Stress: Not on file  Relationships  . Social connections:    Talks on phone: Not on file    Gets together: Not on file    Attends religious service: Not on file    Active member of club or organization: Not on file    Attends meetings of clubs or organizations: Not on file    Relationship status: Not on file  . Intimate partner violence:    Fear of current or ex partner: Not on file    Emotionally abused: Not on file    Physically abused: Not on file    Forced sexual activity: Not on file  Other Topics  Concern  . Not on file  Social History Narrative  . Not on file    Family History:    Family History  Problem Relation Age of Onset  . Heart disease Mother   . Cancer Father        liver  . Cancer Son   . Heart disease Son   . Cancer Maternal Grandmother   . Colon cancer Neg Hx      ROS:  Please see the history of present illness.  Review of Systems  Constitution: Negative.  HENT: Positive for hearing loss.   Eyes: Negative.   Cardiovascular: Positive for chest pain and dyspnea on exertion.  Respiratory: Positive for cough and shortness of breath.   Hematologic/Lymphatic: Negative.   Musculoskeletal: Negative.  Negative for joint pain.  Gastrointestinal: Negative.   Genitourinary: Negative.   Neurological: Negative.     All other ROS reviewed and negative.     Physical Exam/Data:   Vitals:   05/04/18 0000 05/04/18 0012 05/04/18 0515 05/04/18 0530  BP: 140/76 140/76 (!) 157/81   Pulse: 63 64    Resp: (!) 23 17  18   Temp:  98.1 F (36.7 C)    TempSrc:  Oral    SpO2: 94% 94%    Weight:      Height:        Intake/Output Summary (Last 24 hours) at 05/04/2018 0757 Last data filed at 05/04/2018 0103 Gross per 24 hour  Intake 50 ml  Output -  Net 50 ml   Filed Weights   05/03/18 1624  Weight: 64 kg   Body mass index is 24.98 kg/m.  General:  Well nourished, well developed, in no acute distress   HEENT: normal Lymph: no adenopathy Neck: slight increase JVD Endocrine:  No thryomegaly Vascular: No carotid bruits; FA pulses 2+ bilaterally without bruits, right arm at cath site with small hematoma. Good radial and brachial pulses Cardiac:  normal S1, S2;  RRR; 2/6 systolic murmur LSB Lungs:  Decreased breath sounds throughout with rhonchi left lung base Abd: soft, nontender, no hepatomegaly  Ext: no edema Musculoskeletal:  No deformities, BUE and BLE strength normal and equal Skin: warm and dry  Neuro:  CNs 2-12 intact, no focal abnormalities noted Psych:   Normal affect   EKG:  The EKG was personally reviewed and demonstrates: Normal sinus rhythm with nonspecific ST-T wave changes, no acute change Telemetry:  Telemetry was personally reviewed and demonstrates:  Not on telemetry  Relevant CV Studies:  Cardiac catheterization 10/31/2019Acute coronary syndrome was relatively flat troponin curve.  Left dominant coronary anatomy with marked epicardial vessel tortuosity  Widely patent left main  Eccentric hazy up to 50% proximal to mid LAD lesion with unusual characteristics raising the question of spontaneous coronary artery dissection.  TIMI grade III flow is noted.  Patient is asymptomatic.  Dominant circumflex with very tortuous obtuse marginal branches and PDA.  No significant obstruction.  Nondominant right coronary with calcification and ostial 50% narrowing  Poor quality left ventriculogram obtained by hand injection.  Upper normal LVEDP 16 to 19 mmHg.  No akinetic or severely hypokinetic segments are noted.   RECOMMENDATIONS:    Angiography demonstrates widely patent left coronary system with the exception of the proximal to mid LAD where there is unusual flow within a somewhat hazy lesion that raises the question of spontaneous coronary artery dissection.  Plan to DC IV heparin.  Start Plavix.  Uptitrate beta-blocker therapy.  Monitor course clinically.  Restudy if significant recurrences of chest pain, otherwise would treat as SCAD and anticipate healing.  I would not discharge her from the hospital right away.  She needs 48 hours or longer in the hospital to assure stability.   Recommend uninterrupted dual antiplatelet therapy with Aspirin 81mg  daily and Clopidogrel 75mg  daily for a minimum of 12 months (ACS - Class I recommendation). 2D echo 4/2018Study Conclusions   - Left ventricle: The cavity size was normal. Severe asymmetric   septal hypertrophy. There is no SAM or dynamic gradient. Systolic   function was normal. The  estimated ejection fraction was in the   range of 60% to 65%. Wall motion was normal; there were no   regional wall motion abnormalities. Doppler parameters are   consistent with abnormal left ventricular relaxation (grade 1   diastolic dysfunction). LA pressure is indeterminant. - Aortic valve: Moderately calcified annulus. Trileaflet;   moderately thickened leaflets. Valve area (VTI): 1.82 cm^2. Valve   area (Vmax): 1.8 cm^2. Valve area (Vmean): 1.89 cm^2. - Pulmonary arteries: Systolic pressure was mildly to moderately   increased. PA peak pressure: 39 mm Hg (S). - Systemic veins: IVC is dilated with normal respiratory variation,   estimated RA pressure is 8 mmHg. - Technically adequate study.   Laboratory Data:  Chemistry Recent Labs  Lab 05/03/18 1703 05/04/18 0431  NA 137 140  K 3.5 4.1  CL 106 105  CO2 23 27  GLUCOSE 118* 101*  BUN 16 12  CREATININE 0.88 0.72  CALCIUM 8.8* 8.4*  GFRNONAA >60 >60  GFRAA >60 >60  ANIONGAP 8 8    Recent Labs  Lab 05/03/18 2249  PROT 6.8  ALBUMIN 3.7  AST 25  ALT 18  ALKPHOS 93  BILITOT 1.0   Hematology Recent Labs  Lab 05/03/18 1703  WBC 10.0  RBC 3.51*  HGB 11.1*  HCT 35.1*  MCV 100.0  MCH 31.6  MCHC 31.6  RDW 13.0  PLT 255  Cardiac Enzymes Recent Labs  Lab 05/03/18 1703 05/03/18 2249 05/04/18 0431  TROPONINI 0.09* 0.09* 0.08*    Recent Labs  Lab 05/03/18 2005  TROPIPOC 0.05    BNP Recent Labs  Lab 05/03/18 1703  BNP 393.0*    DDimer No results for input(s): DDIMER in the last 168 hours.  Radiology/Studies:  Dg Chest 2 View  Result Date: 05/03/2018 CLINICAL DATA:  Shortness of breath EXAM: CHEST - 2 VIEW COMPARISON:  Chest x-rays dated 04/22/2018 and 08/27/2016. FINDINGS: Heart size and mediastinal contours are stable. Lungs are clear. No pleural effusion or pneumothorax seen. No acute or suspicious osseous finding. IMPRESSION: No active cardiopulmonary disease. No evidence of pneumonia or  pulmonary edema. Electronically Signed   By: Franki Cabot M.D.   On: 05/03/2018 18:06   Ct Angio Chest Pe W/cm &/or Wo Cm  Result Date: 05/03/2018 CLINICAL DATA:  77 y/o F; increasing shortness of breath. Recent cardiac catheterization. PE suspected, high pretest prob EXAM: CT ANGIOGRAPHY CHEST WITH CONTRAST TECHNIQUE: Multidetector CT imaging of the chest was performed using the standard protocol during bolus administration of intravenous contrast. Multiplanar CT image reconstructions and MIPs were obtained to evaluate the vascular anatomy. CONTRAST:  79mL ISOVUE-370 IOPAMIDOL (ISOVUE-370) INJECTION 76% COMPARISON:  04/23/2018 CT chest. FINDINGS: Cardiovascular: Satisfactory opacification of the pulmonary arteries to the segmental level. No evidence of pulmonary embolism. Normal heart size. No pericardial effusion. Moderate to severe coronary artery and aortic calcific atherosclerosis. Mediastinum/Nodes: No enlarged mediastinal, hilar, or axillary lymph nodes. Thyroid gland, trachea, and esophagus demonstrate no significant findings. Lungs/Pleura: Mild interlobular septal thickening. Faint ground-glass opacification of the lungs with areas of sparing. Dependent right lower lobe small subsegmental consolidation. Small right pleural effusion. No pneumothorax. Upper Abdomen: No acute abnormality. Musculoskeletal: No chest wall abnormality. No acute osseous findings. Moderate multilevel discogenic degenerative changes of the thoracic spine. Review of the MIP images confirms the above findings. IMPRESSION: 1. No pulmonary embolus identified. 2. Interstitial pulmonary edema. Small right pleural effusion. 3. Dependent right lower lobe subsegmental consolidation may represent atelectasis or pneumonia. 4. Moderate to severe coronary artery and aortic calcific Electronically Signed   By: Kristine Garbe M.D.   On: 05/03/2018 19:43    Assessment and Plan:   1. Chest pain with flat troponins may be  secondary to CHF but with recent NSTEMI will need to ambulate here to see how she does. Check echo 2. CAD status post NSTEMI 04/23/2018 question secondary to spontaneous coronary dissection with 50% proximal to mid LAD. Continue Plavix and ASA. 3. CHF with BNP of 393 treated with IV Lasix 20 mg x 1 2D echo pending.  Prior normal LV function in 2018, LV gram difficult to see at cath. I/O's incomplete 4. Hypertension BP up a little this am but hasn't received her meds-amlodipine/avapro-resume metoprolol 100 mg bid 5. Hyperlipidemia on lipitor 6. Previous stroke 7. Chronic bronchitis- some wheezing and rhonchi with ? Pneumonia vs atelectasis on CT. No fever or elevated WBC      For questions or updates, please contact [place] Please consult [email or web address] for contact info under     Signed, Ermalinda Barrios, PA-C  05/04/2018 7:57 AM   The patient was seen and examined, and I agree with the history, physical exam, assessment and plan as documented above, with modifications as noted below. I have also personally reviewed all relevant documentation, old records, labs, and both radiographic and cardiovascular studies. I have also independently interpreted old and new ECG's.  Briefly, this  is a 77 year old woman who was recently treated for a non-STEMI at [facility]  Cardiac catheterization reviewed above which showed possible spontaneous LAD dissection with eccentric hazy up to 50% proximal to mid LAD stenosis.  However, TIMI-3 flow was noted.  LVEDP was elevated.  Left ventriculogram was of poor quality but there were no akinetic or severely hypokinetic segments noted.  After going home and getting out of the car and climbing the stairs to her house she felt very short of breath.  She noted that whenever she would climb stairs going up or down she would be short of breath.  She also noted being short of breath when bending over.  She said this was very unusual for her.  She denies chest  pain.  She denies fever.  She does have a cough.   She called her PCPs office on Friday and they instructed her to come to the ED.  She came yesterday.  ECG is without acute ischemic abnormalities.  Radiographic studies reviewed above with CT angiography demonstrating interstitial pulmonary edema and a small right pleural effusion.  There was dependent right lower lobe subsegmental consolidation which may represent atelectasis or pneumonia.  Troponins have peaked at 0.09 and most recently 0.08.  BNP 393.  I reviewed the echocardiogram dated 10/07/2016 which demonstrated normal left ventricular systolic function.  There was moderate aortic annular calcification with moderately thickened leaflets and no evidence of stenosis.  Physical examination notable for a 2/6 systolic murmur heard throughout precordium and loudest over right upper sternal border.  She was given 1 dose of IV Lasix and has urinated frequently since then.  Input and output not accurately assessed at this time.  I recommend repeating an echocardiogram to assess for interval changes in cardiac structure and function.  I agree that it would be helpful to assess symptoms while ambulating given her recent non-STEMI and possible spontaneous coronary artery dissection (to see if symptoms represent an anginal equivalent and to see if symptoms have improved after diuresis), albeit troponins are minimally elevated and less than what they were at the time of cath.  Continue aspirin and Plavix.  Is unclear if a pulmonary infection is also playing a role albeit there is no evidence of leukocytosis nor fevers, although both of these can be blunted in the elderly in spite of an ongoing infection.  Consider preemptive treatment.  She is not on telemetry which I will order.   Kate Sable, MD, [facility]  05/04/2018 9:06 AM

## 2018-05-04 NOTE — Progress Notes (Signed)
*  PRELIMINARY RESULTS* Echocardiogram 2D Echocardiogram has been performed.  Leavy Cella 05/04/2018, 2:13 PM

## 2018-05-04 NOTE — Care Management Note (Signed)
Case Management Note  Patient Details  Name: Connie Wood MRN: [id number] Date of Birth: [birth year]  Subjective/Objective:     Dyspnea. From home. Recent NSTEMI. Patient is independent. Has PCP. Has insurance with prescription coverage.                Action/Plan: Anticipate DC home. No CM needs known. Will follow.   Expected Discharge Date:    unk              Expected Discharge Plan:  Home/Self Care  In-House Referral:     Discharge planning Services  CM Consult  Post Acute Care Choice:  NA Choice offered to:  NA  DME Arranged:    DME Agency:     HH Arranged:    HH Agency:     Status of Service:  Completed, signed off  If discussed at [employer] of Stay Meetings, dates discussed:    Additional Comments:  Jolina Symonds, Chauncey Reading, RN 05/04/2018, 2:08 PM

## 2018-05-05 ENCOUNTER — Observation Stay (HOSPITAL_COMMUNITY): Payer: Medicare Other

## 2018-05-05 DIAGNOSIS — J411 Mucopurulent chronic bronchitis: Secondary | ICD-10-CM

## 2018-05-05 DIAGNOSIS — E7800 Pure hypercholesterolemia, unspecified: Secondary | ICD-10-CM

## 2018-05-05 DIAGNOSIS — J209 Acute bronchitis, unspecified: Secondary | ICD-10-CM

## 2018-05-05 DIAGNOSIS — R0609 Other forms of dyspnea: Secondary | ICD-10-CM | POA: Diagnosis not present

## 2018-05-05 DIAGNOSIS — J42 Unspecified chronic bronchitis: Secondary | ICD-10-CM | POA: Diagnosis not present

## 2018-05-05 DIAGNOSIS — R7989 Other specified abnormal findings of blood chemistry: Secondary | ICD-10-CM | POA: Diagnosis not present

## 2018-05-05 DIAGNOSIS — I25118 Atherosclerotic heart disease of native coronary artery with other forms of angina pectoris: Secondary | ICD-10-CM | POA: Diagnosis not present

## 2018-05-05 DIAGNOSIS — I5031 Acute diastolic (congestive) heart failure: Secondary | ICD-10-CM

## 2018-05-05 LAB — BASIC METABOLIC PANEL
Anion gap: 8 (ref 5–15)
BUN: 16 mg/dL (ref 8–23)
CHLORIDE: 106 mmol/L (ref 98–111)
CO2: 26 mmol/L (ref 22–32)
CREATININE: 0.79 mg/dL (ref 0.44–1.00)
Calcium: 8.3 mg/dL — ABNORMAL LOW (ref 8.9–10.3)
GFR calc Af Amer: >60 mL/min (ref >60)
GFR calc non Af Amer: >60 mL/min (ref >60)
GLUCOSE: 108 mg/dL — AB (ref 70–99)
Potassium: 3.6 mmol/L (ref 3.5–5.1)
Sodium: 140 mmol/L (ref 135–145)

## 2018-05-05 MED ORDER — FUROSEMIDE 20 MG PO TABS
20.0000 mg | ORAL_TABLET | Freq: Every day | ORAL | Status: DC
  Administered 2018-05-05: 20 mg via ORAL
  Filled 2018-05-05: qty 1

## 2018-05-05 MED ORDER — AZITHROMYCIN 250 MG PO TABS: ORAL_TABLET | ORAL | 0 refills | Status: DC

## 2018-05-05 MED ORDER — POTASSIUM CHLORIDE CRYS ER 10 MEQ PO TBCR: 10.0000 meq | EXTENDED_RELEASE_TABLET | Freq: Every day | ORAL | 0 refills | Status: DC

## 2018-05-05 MED ORDER — IRBESARTAN 75 MG PO TABS: 75.0000 mg | ORAL_TABLET | Freq: Every day | ORAL | 11 refills | Status: DC

## 2018-05-05 MED ORDER — FUROSEMIDE 20 MG PO TABS: 20.0000 mg | ORAL_TABLET | Freq: Every day | ORAL | 1 refills | Status: DC

## 2018-05-05 MED ORDER — POTASSIUM CHLORIDE CRYS ER 10 MEQ PO TBCR
10.0000 meq | EXTENDED_RELEASE_TABLET | Freq: Every day | ORAL | Status: DC
  Administered 2018-05-05: 10 meq via ORAL
  Filled 2018-05-05: qty 1

## 2018-05-05 NOTE — Progress Notes (Signed)
IV removed and discharge instructions reviewed.

## 2018-05-05 NOTE — Progress Notes (Signed)
Progress Note  Patient Name: Connie Wood Date of Encounter: 05/05/2018  Primary Cardiologist: Jenkins Rouge, MD   Subjective   She still has exertional dyspnea.  She walked to the bathroom earlier this morning and felt short of breath and had pain in both arms.  She denies chest pain.  She feels fine at rest.  Inpatient Medications    Scheduled Meds: . amLODipine  10 mg Oral Daily   Or  . irbesartan  300 mg Oral Daily  . aspirin  81 mg Oral Daily  . atorvastatin  80 mg Oral q1800  . azithromycin  500 mg Oral Daily  . clopidogrel  75 mg Oral Daily  . enoxaparin (LOVENOX) injection  40 mg Subcutaneous Q24H  . HYDROcodone-acetaminophen  1 tablet Oral TID  . metoprolol tartrate  100 mg Oral BID  . pantoprazole  40 mg Oral Daily  . umeclidinium-vilanterol  1 puff Inhalation Daily  . venlafaxine XR  150 mg Oral Q breakfast   Continuous Infusions:  PRN Meds: acetaminophen **OR** acetaminophen, loperamide, ofloxacin, ondansetron **OR** ondansetron (ZOFRAN) IV   Vital Signs    Vitals:   05/05/18 0451 05/05/18 0616 05/05/18 0838 05/05/18 0914  BP: (!) 144/80     Pulse: 63  66   Resp: 20     Temp: 98.4 F (36.9 C)     TempSrc: Oral     SpO2: 96%  91% 91%  Weight:  63.8 kg    Height:        Intake/Output Summary (Last 24 hours) at 05/05/2018 1053 Last data filed at 05/05/2018 0803 Gross per 24 hour  Intake -  Output 350 ml  Net -350 ml   Filed Weights   05/03/18 1624 05/05/18 0616  Weight: 64 kg 63.8 kg    Telemetry    Sinus rhythm- Personally Reviewed  ECG    No new tracings- Personally Reviewed  Physical Exam   GEN: No acute distress.   Neck: No JVD Cardiac: RRR, 2/6 systolic murmur over right upper sternal border, rubs, or gallops.  Respiratory:  Scattered crackles and expiratory wheezes bilaterally. GI: Soft, nontender, non-distended  MS: No edema; No deformity. Neuro:  Nonfocal  Psych: Normal affect   Labs    Chemistry Recent  Labs  Lab 05/03/18 1703 05/03/18 2249 05/04/18 0431 05/05/18 0521  NA 137  --  140 140  K 3.5  --  4.1 3.6  CL 106  --  105 106  CO2 23  --  27 26  GLUCOSE 118*  --  101* 108*  BUN 16  --  12 16  CREATININE 0.88  --  0.72 0.79  CALCIUM 8.8*  --  8.4* 8.3*  PROT  --  6.8  --   --   ALBUMIN  --  3.7  --   --   AST  --  25  --   --   ALT  --  18  --   --   ALKPHOS  --  93  --   --   BILITOT  --  1.0  --   --   GFRNONAA >60  --  >60 >60  GFRAA >60  --  >60 >60  ANIONGAP 8  --  8 8     Hematology Recent Labs  Lab 05/03/18 1703  WBC 10.0  RBC 3.51*  HGB 11.1*  HCT 35.1*  MCV 100.0  MCH 31.6  MCHC 31.6  RDW 13.0  PLT 255  Cardiac Enzymes Recent Labs  Lab 05/03/18 1703 05/03/18 2249 05/04/18 0431  TROPONINI 0.09* 0.09* 0.08*    Recent Labs  Lab 05/03/18 2005  TROPIPOC 0.05     BNP Recent Labs  Lab 05/03/18 1703  BNP 393.0*     DDimer No results for input(s): DDIMER in the last 168 hours.   Radiology    Dg Chest 2 View  Result Date: 05/03/2018 CLINICAL DATA:  Shortness of breath EXAM: CHEST - 2 VIEW COMPARISON:  Chest x-rays dated 04/22/2018 and 08/27/2016. FINDINGS: Heart size and mediastinal contours are stable. Lungs are clear. No pleural effusion or pneumothorax seen. No acute or suspicious osseous finding. IMPRESSION: No active cardiopulmonary disease. No evidence of pneumonia or pulmonary edema. Electronically Signed   By: Franki Cabot M.D.   On: 05/03/2018 18:06   Ct Angio Chest Pe W/cm &/or Wo Cm  Result Date: 05/03/2018 CLINICAL DATA:  77 y/o F; increasing shortness of breath. Recent cardiac catheterization. PE suspected, high pretest prob EXAM: CT ANGIOGRAPHY CHEST WITH CONTRAST TECHNIQUE: Multidetector CT imaging of the chest was performed using the standard protocol during bolus administration of intravenous contrast. Multiplanar CT image reconstructions and MIPs were obtained to evaluate the vascular anatomy. CONTRAST:  97mL ISOVUE-370  IOPAMIDOL (ISOVUE-370) INJECTION 76% COMPARISON:  04/23/2018 CT chest. FINDINGS: Cardiovascular: Satisfactory opacification of the pulmonary arteries to the segmental level. No evidence of pulmonary embolism. Normal heart size. No pericardial effusion. Moderate to severe coronary artery and aortic calcific atherosclerosis. Mediastinum/Nodes: No enlarged mediastinal, hilar, or axillary lymph nodes. Thyroid gland, trachea, and esophagus demonstrate no significant findings. Lungs/Pleura: Mild interlobular septal thickening. Faint ground-glass opacification of the lungs with areas of sparing. Dependent right lower lobe small subsegmental consolidation. Small right pleural effusion. No pneumothorax. Upper Abdomen: No acute abnormality. Musculoskeletal: No chest wall abnormality. No acute osseous findings. Moderate multilevel discogenic degenerative changes of the thoracic spine. Review of the MIP images confirms the above findings. IMPRESSION: 1. No pulmonary embolus identified. 2. Interstitial pulmonary edema. Small right pleural effusion. 3. Dependent right lower lobe subsegmental consolidation may represent atelectasis or pneumonia. 4. Moderate to severe coronary artery and aortic calcific Electronically Signed   By: Kristine Garbe M.D.   On: 05/03/2018 19:43    Cardiac Studies    Cardiac catheterization 10/31/2019Acute coronary syndrome was relatively flat troponin curve.  Left dominant coronary anatomy with marked epicardial vessel tortuosity  Widely patent left main  Eccentric hazy up to 50% proximal to mid LAD lesion with unusual characteristics raising the question of spontaneous coronary artery dissection. TIMI grade III flow is noted. Patient is asymptomatic.  Dominant circumflex with very tortuous obtuse marginal branches and PDA. No significant obstruction.  Nondominant right coronary with calcification and ostial 50% narrowing  Poor quality left ventriculogram obtained by hand  injection. Upper normal LVEDP 16 to 19 mmHg. No akinetic or severely hypokinetic segments are noted.  RECOMMENDATIONS:   Angiography demonstrates widely patent left coronary system with the exception of the proximal to mid LAD where there is unusual flow within a somewhat hazy lesion that raises the question of spontaneous coronary artery dissection. Plan to DC IV heparin. Start Plavix. Uptitrate beta-blocker therapy. Monitor course clinically. Restudy if significant recurrences of chest pain, otherwise would treat as SCAD and anticipate healing.  I would not discharge her from the hospital right away. She needs 48 hours or longer in the hospital to assure stability.  Recommend uninterrupted dual antiplatelet therapy with Aspirin 81mg  daily and Clopidogrel 75mg  dailyfor  a minimum of 12 months (ACS - Class I recommendation).  Echocardiogram 05/04/2018:  Study Conclusions  - Left ventricle: The cavity size was normal. Wall thickness was   increased in a pattern of mild LVH. Systolic function was normal.   The estimated ejection fraction was 65%. Wall motion was normal;   there were no regional wall motion abnormalities. Doppler   parameters are consistent with abnormal left ventricular   relaxation (grade 1 diastolic dysfunction). - Aortic valve: Moderately to severely calcified annulus. Mildly   thickened, mildly calcified leaflets. Uncertain leaflet number   (probably trileaflet). I do not appreciate any hemodynamically   signficant stenosis although the valve is suboptimally   visualized. - Mitral valve: Mildly calcified annulus. - Tricuspid valve: There was mild regurgitation.   Patient Profile     77 y.o. female with a hx of CAD status post NSTEMI 04/23/2018 who is being seen today for the evaluation of chest pain & shortness of breath at the request of Dr. Jeannetta Nap.  Assessment & Plan    1.  Exertional dyspnea: This may be due to a pulmonic process/pulmonary  infection.  She does have expiratory wheezes and this may be due to acute on chronic bronchitis.  She has been started on azithromycin.  I will obtain a follow-up portable chest x-ray to assess for interval changes today.   I will also start low-dose Lasix 20 mg daily along with supplemental potassium as there may be a component of acute diastolic heart failure as well (BNP 393). Echocardiogram performed yesterday reviewed above was reassuring with normal left ventricular systolic function and grade 1 diastolic dysfunction.   2.  Coronary artery disease: Status post non-STEMI 04/23/2018. Cardiac catheterization reviewed above which showed possible spontaneous LAD dissection with eccentric hazy up to 50% proximal to mid LAD stenosis.  However, TIMI-3 flow was noted..  Continue aspirin, atorvastatin, metoprolol, and Plavix.  Left ventricular systolic function and regional wall motion are normal by echocardiogram on 05/04/2018.  Troponins during this admission are nonspecifically elevated.  3.  Hypertension: Blood pressure is mildly elevated.  No changes.  4.  Hyperlipidemia: Continue atorvastatin 80 mg.    For questions or updates, please contact [facility] Please consult [email or web address] for contact info under Cardiology/STEMI.      Signed, Kate Sable, MD  05/05/2018, 10:53 AM

## 2018-05-05 NOTE — Progress Notes (Signed)
SATURATION QUALIFICATIONS: (This note is used to comply with regulatory documentation for home oxygen)  Patient Saturations on Room Air at Rest = 93%  Patient Saturations on Room Air while Ambulating = 92%  

## 2018-05-05 NOTE — Discharge Summary (Signed)
Physician Discharge Summary  Connie Wood KGU:[id number] DOB: 1940-07-20 DOA: 05/03/2018  PCP: Celene Squibb, MD  Admit date: 05/03/2018 Discharge date: 05/05/2018  Admitted From: Home Disposition: Home  Recommendations for Outpatient Follow-up:  1. Follow up with PCP in 1-2 weeks 2. Please obtain BMP/CBC in one week 3. Please follow-up with cardiologist dr Leonia Reeves none Equipment/Devices: None  Discharge Condition stable CODE STATUS full code Diet recommendation: Cardiac Brief/Interim Summary:77 y.o.femalewith medical history significant ofback pain, cat scratch her right forearm, impaired hearing, history of hemorrhoids, urolithiasis, history of DDD, hyperlipidemia, hypertension, migraine headaches, osteoarthritis, history of mini stroke, recently discharged from [facility] due to NSTEMIwho is coming to the emergency department with complaints of dyspnea on exertion since she got home from the hospital.  She states that when she got back from the hospital she tried to go up the stairs and developed significant dyspnea. She had to sit down for a while to catch her breath. This has happened several times since then and is associated with occasional pressure-like chest pain, mild dizziness and palpitations, she has had productive cough with foamy, whitish looking sputum, but denies nausea, emesis, diaphoresis, PND or recent pitting edema of the lower extremities but states that she has been using 2 pillows now to be able to breathe comfortably in bed. She denies fever, chills, sore throat, wheezing, hemoptysis, abdominal pain, diarrhea, constipation, melena or hematochezia. No dysuria, frequency or hematuria. Denies polyuria, polydipsia, polyphagia and blurry vision.  ED Course:Initial vital signs temperature 98 F, pulse 63, respirations 20, blood pressure 130/73 mmHg and O2 sat 87% on room air. I held the patient's evening dose of metoprolol, ordered furosemide 20  mg IVP x1 along with potassium and magnesium supplementation   Discharge Diagnoses:  Principal Problem:   Dyspnea Active Problems:   Chronic bronchitis (HCC)   Hypertension   Hypercholesteremia   CAD (coronary artery disease)   Elevated troponin   Anemia CHF DIASTOLIC-EF NORMAL NO WMA.  Cardiology started her on Lasix and potassium today.  Patient ambulated without desaturation.CAD (coronary artery disease)s/p recent NSTEMI10/19 Continue atorvastatin, aspirin and Plavix.  Chronic bronchitis (HCC) Continue supplemental oxygen. Bronchodilators as needed. Started z pack  Hypertension-continue current meds  Hypercholesteremia Continue atorvastatin 80 mg p.o. daily. Monitor LFTs.  Anemia Monitor hematocrit and hemoglobin      Estimated body mass index is 24.92 kg/m as calculated from the following:   Height as of this encounter: 5\' 3"  (1.6 m).   Weight as of this encounter: 63.8 kg.  Discharge Instructions  Discharge Instructions    Call MD for:  difficulty breathing, headache or visual disturbances   Complete by:  As directed    Call MD for:  persistant nausea and vomiting   Complete by:  As directed    Call MD for:  redness, tenderness, or signs of infection (pain, swelling, redness, odor or green/yellow discharge around incision site)   Complete by:  As directed    Diet - low sodium heart healthy   Complete by:  As directed    Increase activity slowly   Complete by:  As directed      Allergies as of 05/05/2018      Reactions   Adhesive [tape] Other (See Comments)   Tears skin   Doxycycline Nausea And Vomiting   Latex Itching, Rash      Medication List    TAKE these medications   acetaminophen 500 MG tablet Commonly known as:  TYLENOL Take 2  tablets (1,000 mg total) by mouth every 8 (eight) hours as needed for mild pain, moderate pain or headache.   albuterol 108 (90 Base) MCG/ACT inhaler Commonly known as:  PROVENTIL HFA;VENTOLIN  HFA Inhale 2 puffs into the lungs every 6 (six) hours as needed for wheezing or shortness of breath.   aspirin 81 MG chewable tablet Chew 1 tablet (81 mg total) by mouth daily. What changed:    when to take this  reasons to take this   atorvastatin 80 MG tablet Commonly known as:  LIPITOR Take 1 tablet (80 mg total) by mouth daily at 6 PM.   azithromycin 250 MG tablet Commonly known as:  ZITHROMAX Take 2 tablets daily for 3 days Start taking on:  05/06/2018   AZOR 10-40 MG tablet Generic drug:  amLODipine-olmesartan Take 1 tablet by mouth daily. Reported on 12/04/2015   CITRACAL PO Take 1 tablet by mouth daily.   clopidogrel 75 MG tablet Commonly known as:  PLAVIX Take 1 tablet (75 mg total) by mouth daily.   furosemide 20 MG tablet Commonly known as:  LASIX Take 1 tablet (20 mg total) by mouth daily. Start taking on:  05/06/2018   HYDROcodone-acetaminophen 10-325 MG tablet Commonly known as:  NORCO Take 1 tablet by mouth 3 (three) times daily.   loperamide 2 MG capsule Commonly known as:  IMODIUM Take 4 mg by mouth as needed for diarrhea or loose stools.   metoprolol tartrate 100 MG tablet Commonly known as:  LOPRESSOR Take 1 tablet (100 mg total) by mouth 2 (two) times daily.   ofloxacin 0.3 % ophthalmic solution Commonly known as:  OCUFLOX Place 2 drops into the right ear daily as needed (for pain). 2 drops in right ear daily prn.   omeprazole 20 MG capsule Commonly known as:  PRILOSEC Take 20 mg by mouth daily.   PARoxetine Mesylate 7.5 MG Caps Take 7.5 mg by mouth at bedtime.   potassium chloride 10 MEQ tablet Commonly known as:  K-DUR,KLOR-CON Take 1 tablet (10 mEq total) by mouth daily. Start taking on:  05/06/2018   umeclidinium-vilanterol 62.5-25 MCG/INH Aepb Commonly known as:  ANORO ELLIPTA Inhale 1 puff into the lungs daily.   venlafaxine XR 75 MG 24 hr capsule Commonly known as:  EFFEXOR-XR Take 150 mg by mouth daily with breakfast.    VITAMIN D PO Take 1 tablet by mouth daily.       Allergies  Allergen Reactions  . Adhesive [Tape] Other (See Comments)    Tears skin  . Doxycycline Nausea And Vomiting  . Latex Itching and Rash    Consultations:  cardiology   Procedures/Studies: Dg Chest 2 View  Result Date: 05/03/2018 CLINICAL DATA:  Shortness of breath EXAM: CHEST - 2 VIEW COMPARISON:  Chest x-rays dated 04/22/2018 and 08/27/2016. FINDINGS: Heart size and mediastinal contours are stable. Lungs are clear. No pleural effusion or pneumothorax seen. No acute or suspicious osseous finding. IMPRESSION: No active cardiopulmonary disease. No evidence of pneumonia or pulmonary edema. Electronically Signed   By: Franki Cabot M.D.   On: 05/03/2018 18:06   Dg Chest 2 View  Result Date: 04/23/2018 CLINICAL DATA:  77 year old female with cough. EXAM: CHEST - 2 VIEW COMPARISON:  Chest radiograph dated 08/27/2016 FINDINGS: Mild diffuse interstitial coarsening. No focal consolidation, pleural effusion, pneumothorax. The cardiac silhouette is within normal limits. The aorta is tortuous. There is dilated appearance of the ascending aorta on the lateral view which may be partly related to tortuosity and  superimposition of the heart shadow. The aorta is however poorly evaluated the radiograph. There is degenerative changes of the spine. No acute osseous pathology. IMPRESSION: 1. No acute cardiopulmonary process. 2. Dilated appearance of the ascending aorta on the lateral view may be partly related to tortuosity and superimposition of the heart shadow. Electronically Signed   By: Anner Crete M.D.   On: 04/23/2018 00:21   Ct Head Wo Contrast  Result Date: 04/23/2018 CLINICAL DATA:  Initial evaluation for acute difficulty with balance. EXAM: CT HEAD WITHOUT CONTRAST TECHNIQUE: Contiguous axial images were obtained from the base of the skull through the vertex without intravenous contrast. COMPARISON:  Prior CT from 12/21/2010.  FINDINGS: Brain: Examination somewhat technically limited by patient positioning. Generalized age-related cerebral atrophy, mildly progressed relative to 2012. Scattered patchy hypodensity involving the supratentorial cerebral white matter, most like related chronic small vessel ischemic disease, also mildly progressed. No acute intracranial hemorrhage. No acute large vessel territory infarct. No mass lesion, midline shift or mass effect. No hydrocephalus. No extra-axial fluid collection. Vascular: No hyperdense vessel. Scattered vascular calcifications noted within the carotid siphons. Skull: Scalp soft tissues and calvarium within normal limits. Sinuses/Orbits: Globes and orbital soft tissues demonstrate no acute finding. Visualized paranasal sinuses and mastoid air cells are clear. Other: None. IMPRESSION: 1. No acute intracranial abnormality. 2. Generalized cerebral atrophy with chronic small vessel ischemic disease, mildly advanced relative to 2012. Electronically Signed   By: Jeannine Boga M.D.   On: 04/23/2018 03:53   Ct Angio Chest Pe W/cm &/or Wo Cm  Result Date: 05/03/2018 CLINICAL DATA:  77 y/o F; increasing shortness of breath. Recent cardiac catheterization. PE suspected, high pretest prob EXAM: CT ANGIOGRAPHY CHEST WITH CONTRAST TECHNIQUE: Multidetector CT imaging of the chest was performed using the standard protocol during bolus administration of intravenous contrast. Multiplanar CT image reconstructions and MIPs were obtained to evaluate the vascular anatomy. CONTRAST:  33mL ISOVUE-370 IOPAMIDOL (ISOVUE-370) INJECTION 76% COMPARISON:  04/23/2018 CT chest. FINDINGS: Cardiovascular: Satisfactory opacification of the pulmonary arteries to the segmental level. No evidence of pulmonary embolism. Normal heart size. No pericardial effusion. Moderate to severe coronary artery and aortic calcific atherosclerosis. Mediastinum/Nodes: No enlarged mediastinal, hilar, or axillary lymph nodes. Thyroid  gland, trachea, and esophagus demonstrate no significant findings. Lungs/Pleura: Mild interlobular septal thickening. Faint ground-glass opacification of the lungs with areas of sparing. Dependent right lower lobe small subsegmental consolidation. Small right pleural effusion. No pneumothorax. Upper Abdomen: No acute abnormality. Musculoskeletal: No chest wall abnormality. No acute osseous findings. Moderate multilevel discogenic degenerative changes of the thoracic spine. Review of the MIP images confirms the above findings. IMPRESSION: 1. No pulmonary embolus identified. 2. Interstitial pulmonary edema. Small right pleural effusion. 3. Dependent right lower lobe subsegmental consolidation may represent atelectasis or pneumonia. 4. Moderate to severe coronary artery and aortic calcific Electronically Signed   By: Kristine Garbe M.D.   On: 05/03/2018 19:43   Ct Angio Chest Pe W Or Wo Contrast  Result Date: 04/23/2018 CLINICAL DATA:  77 year old female with hypoxia. Concern for pulmonary embolism. EXAM: CT ANGIOGRAPHY CHEST WITH CONTRAST TECHNIQUE: Multidetector CT imaging of the chest was performed using the standard protocol during bolus administration of intravenous contrast. Multiplanar CT image reconstructions and MIPs were obtained to evaluate the vascular anatomy. CONTRAST:  159mL ISOVUE-370 IOPAMIDOL (ISOVUE-370) INJECTION 76% COMPARISON:  Chest radiograph dated 04/22/2018 and CT dated 02/26/2016 FINDINGS: Cardiovascular: There is no cardiomegaly or pericardial effusion. Mild atherosclerotic calcification of the thoracic aorta. The visualized origins of  the great vessels of the aortic arch appear patent. There is no CT evidence of pulmonary embolism. Mediastinum/Nodes: No hilar or mediastinal adenopathy. Esophagus is grossly unremarkable. No mediastinal fluid collection. Lungs/Pleura: Small ground-glass area in the left upper lobe (series 6, image 44 appears similar to prior CT. There is no  focal consolidation, pleural effusion, or pneumothorax. Small areas of endobronchial nodularity (series 6, image 44 appears similar to prior CT, likely benign given interval stability. Bronchoscopy may provide better evaluation if clinically indicated. Upper Abdomen: Mild biliary ductal dilatation. Partially visualized left renal upper pole irregularity, likely related to underlying cysts. This is not characterized. Ultrasound may provide better evaluation. Musculoskeletal: Degenerative changes of the spine. Review of the MIP images confirms the above findings. IMPRESSION: No acute intrathoracic pathology. No CT evidence of pulmonary embolism. Electronically Signed   By: Anner Crete M.D.   On: 04/23/2018 05:57   Dg Chest Port 1 View  Result Date: 05/05/2018 CLINICAL DATA:  Sob/htn/ex smoker/hx heart cath EXAM: PORTABLE CHEST 1 VIEW COMPARISON:  Chest radiograph 05/03/2018 FINDINGS: Normal cardiac silhouette. Ectatic aorta. No effusion, infiltrate or pneumothorax. Mild LEFT basilar atelectasis similar comparison exam. No acute osseous abnormality. IMPRESSION: No acute cardiopulmonary process. Electronically Signed   By: Suzy Bouchard M.D.   On: 05/05/2018 11:27   (Echo, Carotid, EGD, Colonoscopy, ERCP)    Subjective:   Discharge Exam: Vitals:   05/05/18 0914 05/05/18 1321  BP:  112/67  Pulse:  64  Resp:  17  Temp:  98 F (36.7 C)  SpO2: 91% 93%   Vitals:   05/05/18 0616 05/05/18 0838 05/05/18 0914 05/05/18 1321  BP:    112/67  Pulse:  66  64  Resp:    17  Temp:    98 F (36.7 C)  TempSrc:    Oral  SpO2:  91% 91% 93%  Weight: 63.8 kg     Height:        General: Pt is alert, awake, not in acute distress Cardiovascular: RRR, S1/S2 +, no rubs, no gallops Respiratory: CTA bilaterally, no wheezing, no rhonchi Abdominal: Soft, NT, ND, bowel sounds + Extremities: no edema, no cyanosis    The results of significant diagnostics from this hospitalization (including imaging,  microbiology, ancillary and laboratory) are listed below for reference.     Microbiology: No results found for this or any previous visit (from the past 240 hour(s)).   Labs: BNP (last 3 results) Recent Labs    05/03/18 1703  BNP 893.8*   Basic Metabolic Panel: Recent Labs  Lab 05/03/18 1703 05/04/18 0431 05/05/18 0521  NA 137 140 140  K 3.5 4.1 3.6  CL 106 105 106  CO2 23 27 26   GLUCOSE 118* 101* 108*  BUN 16 12 16   CREATININE 0.88 0.72 0.79  CALCIUM 8.8* 8.4* 8.3*  MG 1.8  --   --   PHOS 3.5  --   --    Liver Function Tests: Recent Labs  Lab 05/03/18 2249  AST 25  ALT 18  ALKPHOS 93  BILITOT 1.0  PROT 6.8  ALBUMIN 3.7   No results for input(s): LIPASE, AMYLASE in the last 168 hours. No results for input(s): AMMONIA in the last 168 hours. CBC: Recent Labs  Lab 05/03/18 1703  WBC 10.0  HGB 11.1*  HCT 35.1*  MCV 100.0  PLT 255   Cardiac Enzymes: Recent Labs  Lab 05/03/18 1703 05/03/18 2249 05/04/18 0431  TROPONINI 0.09* 0.09* 0.08*   BNP: Invalid  input(s): POCBNP CBG: No results for input(s): GLUCAP in the last 168 hours. D-Dimer No results for input(s): DDIMER in the last 72 hours. Hgb A1c No results for input(s): HGBA1C in the last 72 hours. Lipid Profile No results for input(s): CHOL, HDL, LDLCALC, TRIG, CHOLHDL, LDLDIRECT in the last 72 hours. Thyroid function studies No results for input(s): TSH, T4TOTAL, T3FREE, THYROIDAB in the last 72 hours.  Invalid input(s): FREET3 Anemia work up No results for input(s): VITAMINB12, FOLATE, FERRITIN, TIBC, IRON, RETICCTPCT in the last 72 hours. Urinalysis    Component Value Date/Time   COLORURINE YELLOW 04/23/2018 0110   APPEARANCEUR HAZY (A) 04/23/2018 0110   LABSPEC 1.016 04/23/2018 0110   PHURINE 6.0 04/23/2018 0110   GLUCOSEU NEGATIVE 04/23/2018 0110   HGBUR NEGATIVE 04/23/2018 0110   BILIRUBINUR NEGATIVE 04/23/2018 0110   KETONESUR NEGATIVE 04/23/2018 0110   PROTEINUR 30 (A)  04/23/2018 0110   UROBILINOGEN 0.2 11/30/2014 1221   NITRITE NEGATIVE 04/23/2018 0110   LEUKOCYTESUR TRACE (A) 04/23/2018 0110   Sepsis Labs Invalid input(s): PROCALCITONIN,  WBC,  LACTICIDVEN Microbiology No results found for this or any previous visit (from the past 240 hour(s)).   Time coordinating discharge: Over 30 minutes  SIGNED:   Georgette Shell, MD  [facility] 05/05/2018, 2:23 PM Pager   If 7PM-7AM, please contact night-coverage [email or web address] Password [email or web address]

## 2018-05-05 NOTE — Progress Notes (Signed)
PROGRESS NOTE    Kaisy Severino Keitt  JKK:[id number] DOB: [birth year] DOA: 05/03/2018 PCP: Celene Squibb, MD   Brief Narrative:77 y.o.femalewith medical history significant ofback pain, cat scratch her right forearm, impaired hearing, history of hemorrhoids, urolithiasis, history of DDD, hyperlipidemia, hypertension, migraine headaches, osteoarthritis, history of mini stroke, recently discharged from [facility] due to NSTEMIwho is coming to the emergency department with complaints of dyspnea on exertion since she got home from the hospital.  She states that when she got back from the hospital she tried to go up the stairs and developed significant dyspnea. She had to sit down for a while to catch her breath. This has happened several times since then and is associated with occasional pressure-like chest pain, mild dizziness and palpitations, she has had productive cough with foamy, whitish looking sputum, but denies nausea, emesis, diaphoresis, PND or recent pitting edema of the lower extremities but states that she has been using 2 pillows now to be able to breathe comfortably in bed. She denies fever, chills, sore throat, wheezing, hemoptysis, abdominal pain, diarrhea, constipation, melena or hematochezia. No dysuria, frequency or hematuria. Denies polyuria, polydipsia, polyphagia and blurry vision.  ED Course:Initial vital signs temperature 98 F, pulse 63, respirations 20, blood pressure 130/73 mmHg and O2 sat 87% on room air. I held the patient's evening dose of metoprolol, ordered furosemide 20 mg IVP x1 along with potassium and magnesium supplementation.  Her EKG is normal sinus rhythm. Troponin was 0.09 ng/mL. BNP was 393.0 pg/mL. Her white count was 10.0, hemoglobin 11.1 g/dL and platelets 255. BMP shows a glucose of 118 and calcium of 8.8 mg/dL, which is normal when corrected to an albumin of 3.7 g/dL. All other electrolytes are within normal limits. Renal function is normal. LFTs  results are unremarkable. Imaging:Chest radiograph was normal. CTA chest PE protocol did not demonstrate any embolism of the lungs. However, there was interstitial pulmonary edema with a small right pleural effusion. Dependent right lower lobe subsegmental consolidation may be atelectasis versus pneumonia.  Assessment & Plan:   Principal Problem:   Dyspnea Active Problems:   Chronic bronchitis (HCC)   Hypertension   Hypercholesteremia   CAD (coronary artery disease)   Elevated troponin   Anemia    CHF DIASTOLIC-EF NORMAL NO WMA.  Cardiology started her on Lasix and potassium today.  Patient ambulated without desaturation.CAD (coronary artery disease)s/p recent NSTEMI10/19 Continue atorvastatin, aspirin and Plavix.  Chronic bronchitis (HCC) Continue supplemental oxygen. Bronchodilators as needed. Started z pack  Hypertension-continue current meds  Hypercholesteremia Continue atorvastatin 80 mg p.o. daily. Monitor LFTs.  Anemia Monitor hematocrit and hemoglobin             Estimated body mass index is 24.92 kg/m as calculated from the following:   Height as of this encounter: 5\' 3"  (1.6 m).   Weight as of this encounter: 63.8 kg.  DVT prophylaxis: Lovenox Code Status: Full code family Communication none Disposition Plan: DC when cardiology okays Consultants: Cardiology  Procedures: None Antimicrobials: Z-Pak  Subjective: She is off of her oxygen resting in bed in no acute distress complains of dyspnea on exertion  Objective: Vitals:   05/05/18 0616 05/05/18 0838 05/05/18 0914 05/05/18 1321  BP:    112/67  Pulse:  66  64  Resp:    17  Temp:    98 F (36.7 C)  TempSrc:    Oral  SpO2:  91% 91% 93%  Weight: 63.8 kg     Height:  Intake/Output Summary (Last 24 hours) at 05/05/2018 1353 Last data filed at 05/05/2018 0803 Gross per 24 hour  Intake -  Output 350 ml  Net -350 ml   Filed Weights   05/03/18 1624 05/05/18 0616    Weight: 64 kg 63.8 kg    Examination:  General exam: Appears calm and comfortable  Respiratory system: scattered rhonchi to auscultation. Respiratory effort normal. Cardiovascular system: S1 & S2 heard, RRR. No JVD, murmurs, rubs, gallops or clicks. No pedal edema. Gastrointestinal system: Abdomen is nondistended, soft and nontender. No organomegaly or masses felt. Normal bowel sounds heard. Central nervous system: Alert and oriented. No focal neurological deficits. Extremities: Symmetric 5 x 5 power. Skin: No rashes, lesions or ulcers Psychiatry: Judgement and insight appear normal. Mood & affect appropriate.     Data Reviewed: I have personally reviewed following labs and imaging studies  CBC: Recent Labs  Lab 05/03/18 1703  WBC 10.0  HGB 11.1*  HCT 35.1*  MCV 100.0  PLT 098   Basic Metabolic Panel: Recent Labs  Lab 05/03/18 1703 05/04/18 0431 05/05/18 0521  NA 137 140 140  K 3.5 4.1 3.6  CL 106 105 106  CO2 23 27 26   GLUCOSE 118* 101* 108*  BUN 16 12 16   CREATININE 0.88 0.72 0.79  CALCIUM 8.8* 8.4* 8.3*  MG 1.8  --   --   PHOS 3.5  --   --    GFR: Estimated Creatinine Clearance: 53 mL/min (by C-G formula based on SCr of 0.79 mg/dL). Liver Function Tests: Recent Labs  Lab 05/03/18 2249  AST 25  ALT 18  ALKPHOS 93  BILITOT 1.0  PROT 6.8  ALBUMIN 3.7   No results for input(s): LIPASE, AMYLASE in the last 168 hours. No results for input(s): AMMONIA in the last 168 hours. Coagulation Profile: No results for input(s): INR, PROTIME in the last 168 hours. Cardiac Enzymes: Recent Labs  Lab 05/03/18 1703 05/03/18 2249 05/04/18 0431  TROPONINI 0.09* 0.09* 0.08*   BNP (last 3 results) No results for input(s): PROBNP in the last 8760 hours. HbA1C: No results for input(s): HGBA1C in the last 72 hours. CBG: No results for input(s): GLUCAP in the last 168 hours. Lipid Profile: No results for input(s): CHOL, HDL, LDLCALC, TRIG, CHOLHDL, LDLDIRECT in  the last 72 hours. Thyroid Function Tests: No results for input(s): TSH, T4TOTAL, FREET4, T3FREE, THYROIDAB in the last 72 hours. Anemia Panel: No results for input(s): VITAMINB12, FOLATE, FERRITIN, TIBC, IRON, RETICCTPCT in the last 72 hours. Sepsis Labs: No results for input(s): PROCALCITON, LATICACIDVEN in the last 168 hours.  No results found for this or any previous visit (from the past 240 hour(s)).       Radiology Studies: Dg Chest 2 View  Result Date: 05/03/2018 CLINICAL DATA:  Shortness of breath EXAM: CHEST - 2 VIEW COMPARISON:  Chest x-rays dated 04/22/2018 and 08/27/2016. FINDINGS: Heart size and mediastinal contours are stable. Lungs are clear. No pleural effusion or pneumothorax seen. No acute or suspicious osseous finding. IMPRESSION: No active cardiopulmonary disease. No evidence of pneumonia or pulmonary edema. Electronically Signed   By: Franki Cabot M.D.   On: 05/03/2018 18:06   Ct Angio Chest Pe W/cm &/or Wo Cm  Result Date: 05/03/2018 CLINICAL DATA:  77 y/o F; increasing shortness of breath. Recent cardiac catheterization. PE suspected, high pretest prob EXAM: CT ANGIOGRAPHY CHEST WITH CONTRAST TECHNIQUE: Multidetector CT imaging of the chest was performed using the standard protocol during bolus administration of intravenous  contrast. Multiplanar CT image reconstructions and MIPs were obtained to evaluate the vascular anatomy. CONTRAST:  2mL ISOVUE-370 IOPAMIDOL (ISOVUE-370) INJECTION 76% COMPARISON:  04/23/2018 CT chest. FINDINGS: Cardiovascular: Satisfactory opacification of the pulmonary arteries to the segmental level. No evidence of pulmonary embolism. Normal heart size. No pericardial effusion. Moderate to severe coronary artery and aortic calcific atherosclerosis. Mediastinum/Nodes: No enlarged mediastinal, hilar, or axillary lymph nodes. Thyroid gland, trachea, and esophagus demonstrate no significant findings. Lungs/Pleura: Mild interlobular septal thickening.  Faint ground-glass opacification of the lungs with areas of sparing. Dependent right lower lobe small subsegmental consolidation. Small right pleural effusion. No pneumothorax. Upper Abdomen: No acute abnormality. Musculoskeletal: No chest wall abnormality. No acute osseous findings. Moderate multilevel discogenic degenerative changes of the thoracic spine. Review of the MIP images confirms the above findings. IMPRESSION: 1. No pulmonary embolus identified. 2. Interstitial pulmonary edema. Small right pleural effusion. 3. Dependent right lower lobe subsegmental consolidation may represent atelectasis or pneumonia. 4. Moderate to severe coronary artery and aortic calcific Electronically Signed   By: Kristine Garbe M.D.   On: 05/03/2018 19:43   Dg Chest Port 1 View  Result Date: 05/05/2018 CLINICAL DATA:  Sob/htn/ex smoker/hx heart cath EXAM: PORTABLE CHEST 1 VIEW COMPARISON:  Chest radiograph 05/03/2018 FINDINGS: Normal cardiac silhouette. Ectatic aorta. No effusion, infiltrate or pneumothorax. Mild LEFT basilar atelectasis similar comparison exam. No acute osseous abnormality. IMPRESSION: No acute cardiopulmonary process. Electronically Signed   By: Suzy Bouchard M.D.   On: 05/05/2018 11:27        Scheduled Meds: . amLODipine  10 mg Oral Daily   Or  . irbesartan  300 mg Oral Daily  . aspirin  81 mg Oral Daily  . atorvastatin  80 mg Oral q1800  . azithromycin  500 mg Oral Daily  . clopidogrel  75 mg Oral Daily  . enoxaparin (LOVENOX) injection  40 mg Subcutaneous Q24H  . furosemide  20 mg Oral Daily  . HYDROcodone-acetaminophen  1 tablet Oral TID  . metoprolol tartrate  100 mg Oral BID  . pantoprazole  40 mg Oral Daily  . potassium chloride  10 mEq Oral Daily  . umeclidinium-vilanterol  1 puff Inhalation Daily  . venlafaxine XR  150 mg Oral Q breakfast   Continuous Infusions:   LOS: 0 days     Georgette Shell, MD [facility]  If 7PM-7AM, please contact  night-coverage [email or web address] Password [email or web address] 05/05/2018, 1:53 PM

## 2018-05-12 ENCOUNTER — Ambulatory Visit: Payer: Medicare Other | Admitting: Physician Assistant

## 2018-05-13 ENCOUNTER — Other Ambulatory Visit (HOSPITAL_COMMUNITY): Payer: Self-pay | Admitting: Respiratory Therapy

## 2018-05-13 DIAGNOSIS — J441 Chronic obstructive pulmonary disease with (acute) exacerbation: Secondary | ICD-10-CM

## 2018-05-19 ENCOUNTER — Encounter: Payer: Self-pay | Admitting: Cardiovascular Disease

## 2018-06-19 NOTE — Progress Notes (Signed)
Cardiology Office Note   Date:  06/23/2018   ID:  Connie Wood, DOB 1940/12/10, MRN [id number]  PCP:  Celene Squibb, MD  Cardiologist:   Jenkins Rouge, MD   No chief complaint on file.     History of Present Illness: Connie Wood is a 77 y.o. female first seen by me 09/26/16 for vascular disease. She has HTN and HLD Carotid 10/07/16 less than 50% bilateral ICA stenosis Smoker with dyspnea TTE done 05/04/18 EF 65% AV sclerosis Seen in hospital by [facility] for chest pain. Troponin .74 Cath films reviewed no fixed epicardial stenosis ? 50% hazy proximal  LAD ? Coronary dissection EDP 16 mmHg BNP only 393 Rx with heparin and plavix Re admitted 2 days latter with mild volume overload troponin only .09.   Since d/c not compliant with meds and Plavix Discussed need for antiplatelet Rx with SCAD    Past Medical History:  Diagnosis Date  . Back pain   . Cat scratch of right forearm 02/03/2014  . Dyspnea   . Hard of hearing   . Hemorrhoid 04/06/2014  . History of kidney stones   . Hx of degenerative disc disease   . Hypercholesterolemia   . Hypertension   . Migraines   . Osteoarthritis   . Stroke [facility])    mini stroke, resolved in 30 minutes    Past Surgical History:  Procedure Laterality Date  . ABDOMINAL HYSTERECTOMY    . ANTERIOR AND POSTERIOR REPAIR    . APPENDECTOMY    . BILATERAL SALPINGOOPHORECTOMY    . BREAST SURGERY    . CARDIAC CATHETERIZATION    . CHOLECYSTECTOMY    . colonoscopy  2003   Dr. Gala Romney: normal rectum, scattered diverticula  . COLONOSCOPY WITH PROPOFOL N/A 01/01/2016   Procedure: COLONOSCOPY WITH PROPOFOL;  Surgeon: Daneil Dolin, MD;  [facility];  Service: Endoscopy;  Laterality: N/A;  1015  . ERCP with sphincterotomy  2003   Dr. Gala Romney: balloon dredging of bile duct, normal ampulla, choledocholithiasis   . EUS  2016   Dr. Amalia Hailey: subtle 13X17 mm heterogenous lesion in pancreas neck, FNA negative for malignancy   . LEFT HEART  CATH AND CORONARY ANGIOGRAPHY N/A 04/23/2018   Procedure: LEFT HEART CATH AND CORONARY ANGIOGRAPHY;  Surgeon: Belva Crome, MD;  Location: [place] [facility] LAB;  Service: Cardiovascular;  Laterality: N/A;  . POLYPECTOMY  01/01/2016   Procedure: POLYPECTOMY;  Surgeon: Daneil Dolin, MD;  Location: [facility];  Service: Endoscopy;;   polypectomies x2-splenic flexure and cecal  . TONSILLECTOMY       Current Outpatient Medications  Medication Sig Dispense Refill  . acetaminophen (TYLENOL) 500 MG tablet Take 2 tablets (1,000 mg total) by mouth every 8 (eight) hours as needed for mild pain, moderate pain or headache.    . albuterol (PROVENTIL HFA;VENTOLIN HFA) 108 (90 Base) MCG/ACT inhaler Inhale 2 puffs into the lungs every 6 (six) hours as needed for wheezing or shortness of breath. 1 Inhaler 0  . aspirin 81 MG chewable tablet Chew 1 tablet (81 mg total) by mouth daily. (Patient taking differently: Chew 81 mg by mouth daily as needed for mild pain or moderate pain. ) 30 tablet 0  . atorvastatin (LIPITOR) 80 MG tablet Take 1 tablet (80 mg total) by mouth daily at 6 PM. 30 tablet 0  . azithromycin (ZITHROMAX) 250 MG tablet Take 2 tablets daily for 3 days 6 each 0  . AZOR  10-40 MG per tablet Take 1 tablet by mouth daily. Reported on 12/04/2015    . Calcium Citrate (CITRACAL PO) Take 1 tablet by mouth daily.     . Cholecalciferol (VITAMIN D PO) Take 1 tablet by mouth daily.     . clopidogrel (PLAVIX) 75 MG tablet Take 1 tablet (75 mg total) by mouth daily. 30 tablet 0  . furosemide (LASIX) 20 MG tablet Take 1 tablet (20 mg total) by mouth daily. 30 tablet 1  . HYDROcodone-acetaminophen (NORCO) 10-325 MG tablet Take 1 tablet by mouth 3 (three) times daily.     Marland Kitchen loperamide (IMODIUM) 2 MG capsule Take 4 mg by mouth as needed for diarrhea or loose stools.    . metoprolol tartrate (LOPRESSOR) 100 MG tablet Take 1 tablet (100 mg total) by mouth 2 (two) times daily. 60 tablet 0  . ofloxacin (OCUFLOX) 0.3  % ophthalmic solution Place 2 drops into the right ear daily as needed (for pain). 2 drops in right ear daily prn.    . omeprazole (PRILOSEC) 20 MG capsule Take 20 mg by mouth daily.    Marland Kitchen PARoxetine Mesylate 7.5 MG CAPS Take 7.5 mg by mouth at bedtime.    . potassium chloride (K-DUR,KLOR-CON) 10 MEQ tablet Take 1 tablet (10 mEq total) by mouth daily. 30 tablet 0  . umeclidinium-vilanterol (ANORO ELLIPTA) 62.5-25 MCG/INH AEPB Inhale 1 puff into the lungs daily.    Marland Kitchen venlafaxine XR (EFFEXOR-XR) 75 MG 24 hr capsule Take 150 mg by mouth daily with breakfast.      No current facility-administered medications for this visit.     Allergies:   Adhesive [tape]; Doxycycline; and Latex    Social History:  The patient  reports that she has quit smoking. Her smoking use included cigarettes. She has a 50.00 pack-year smoking history. She has never used smokeless tobacco. She reports that she does not drink alcohol or use drugs.   Family History:  The patient's family history includes Cancer in her father, maternal grandmother, and son; Heart disease in her mother and son.    ROS:  Please see the history of present illness.   Otherwise, review of systems are positive for none.   All other systems are reviewed and negative.    PHYSICAL EXAM: VS:  BP 130/70   Pulse (!) 111   Ht 5\' 3"  (1.6 m)   Wt 138 lb (62.6 kg)   SpO2 95%   BMI 24.45 kg/m  , BMI Body mass index is 24.45 kg/m. Affect appropriate Healthy:  appears stated age 23: normal Neck supple with no adenopathy JVP normal no bruits no thyromegaly Lungs clear with no wheezing and good diaphragmatic motion Heart:  S1/S2 no murmur, no rub, gallop or click PMI normal Abdomen: benighn, BS positve, no tenderness, no AAA no bruit.  No HSM or HJR Distal pulses intact with no bruits Trace LE  Edema LLE  Neuro non-focal Skin warm and dry No muscular weakness    EKG:   SR voltage for LVH 03/04/16    Recent Labs: 05/03/2018: ALT 18; B  Natriuretic Peptide 393.0; Hemoglobin 11.1; Magnesium 1.8; Platelets 255 05/05/2018: BUN 16; Creatinine, Ser 0.79; Potassium 3.6; Sodium 140    Lipid Panel    Component Value Date/Time   CHOL 202 (H) 04/24/2018 0403   TRIG 186 (H) 04/24/2018 0403   HDL 73 04/24/2018 0403   CHOLHDL 2.8 04/24/2018 0403   VLDL 37 04/24/2018 0403   LDLCALC 92 04/24/2018 0403  Wt Readings from Last 3 Encounters:  06/23/18 138 lb (62.6 kg)  05/05/18 140 lb 10.5 oz (63.8 kg)  04/25/18 138 lb 8 oz (62.8 kg)      Other studies Reviewed: Additional studies/ records that were reviewed today include: Notes Dr Nevada Crane and Dr Harl Bowie previous echo and carotid duplex .    ASSESSMENT AND PLAN:  1.HTN Well controlled.  Continue current medications and low sodium Dash type diet.   2. Cholesterol diet RX LDL 115  3. PVD/Carotid  Duplex 10/07/16 Less than 50% ICA stenosis f/u duplex ordered  4. Dyspnea COPD and smoking normal examTTE 05/04/18 EF normal no valve disease  5. Edema dependant venous support hose and PRN diuretic 6. Smoking quit this summer f/u yearly CXR NAD 05/05/18  7. CAD: ? SCAD 04/23/18 Rx medically Discussed need for compliance with ASA/Plavix   F/U in [place] 6 months  Carotid duplex ordered     Signed, Jenkins Rouge, MD  06/23/2018 11:19 AM    [facility] [address], [place], [place]  [address] Phone: [phone]; Fax: [phone]

## 2018-06-23 ENCOUNTER — Encounter: Payer: Self-pay | Admitting: Cardiovascular Disease

## 2018-06-23 ENCOUNTER — Ambulatory Visit: Payer: Medicare Other | Admitting: Cardiovascular Disease

## 2018-06-23 VITALS — BP 130/70 | HR 111 | Ht 63.0 in | Wt 138.0 lb

## 2018-06-23 DIAGNOSIS — I6523 Occlusion and stenosis of bilateral carotid arteries: Secondary | ICD-10-CM

## 2018-06-23 NOTE — Patient Instructions (Signed)
Medication Instructions:   If you need a refill on your cardiac medications before your next appointment, please call your pharmacy.   Lab work:  If you have labs (blood work) drawn today and your tests are completely normal, you will receive your results only by: . MyChart Message (if you have MyChart) OR . A paper copy in the mail If you have any lab test that is abnormal or we need to change your treatment, we will call you to review the results.  Testing/Procedures: Your physician has requested that you have a carotid duplex. This test is an ultrasound of the carotid arteries in your neck. It looks at blood flow through these arteries that supply the brain with blood. Allow one hour for this exam. There are no restrictions or special instructions.  Follow-Up: At [facility], you and your health needs are our priority.  As part of our continuing mission to provide you with exceptional heart care, we have created designated Provider Care Teams.  These Care Teams include your primary Cardiologist (physician) and Advanced Practice Providers (APPs -  Physician Assistants and Nurse Practitioners) who all work together to provide you with the care you need, when you need it. You will need a follow up appointment in 6 months.  Please call our office 2 months in advance to schedule this appointment.  You may see Peter Nishan, MD or one of the following Advanced Practice Providers on your designated Care Team:   Lori Gerhardt, NP Laura Ingold, NP . Jill McDaniel, NP    

## 2018-06-30 ENCOUNTER — Ambulatory Visit (HOSPITAL_COMMUNITY)
Admission: RE | Admit: 2018-06-30 | Payer: Medicare Other | Source: Ambulatory Visit | Attending: Cardiovascular Disease | Admitting: Cardiovascular Disease

## 2018-09-10 ENCOUNTER — Inpatient Hospital Stay (HOSPITAL_COMMUNITY): Admission: RE | Admit: 2018-09-10 | Payer: Medicare Other | Source: Ambulatory Visit

## 2018-10-06 ENCOUNTER — Inpatient Hospital Stay (HOSPITAL_COMMUNITY): Admission: RE | Admit: 2018-10-06 | Payer: Medicare Other | Source: Ambulatory Visit

## 2018-11-23 ENCOUNTER — Encounter (HOSPITAL_COMMUNITY): Payer: Self-pay | Admitting: *Deleted

## 2018-12-21 ENCOUNTER — Encounter: Payer: Self-pay | Admitting: Internal Medicine

## 2019-01-18 ENCOUNTER — Ambulatory Visit (INDEPENDENT_AMBULATORY_CARE_PROVIDER_SITE_OTHER): Payer: Medicare Other | Admitting: Otolaryngology

## 2019-01-18 ENCOUNTER — Other Ambulatory Visit: Payer: Self-pay

## 2019-01-18 DIAGNOSIS — H903 Sensorineural hearing loss, bilateral: Secondary | ICD-10-CM

## 2019-01-18 DIAGNOSIS — H7203 Central perforation of tympanic membrane, bilateral: Secondary | ICD-10-CM

## 2019-01-18 DIAGNOSIS — H9313 Tinnitus, bilateral: Secondary | ICD-10-CM

## 2019-02-18 ENCOUNTER — Telehealth (HOSPITAL_COMMUNITY): Payer: Self-pay | Admitting: *Deleted

## 2019-02-18 NOTE — Telephone Encounter (Signed)
Have attempted multiple times to contact patient regarding scheduling Prolia.  Does not answer her phone listed.

## 2019-02-19 ENCOUNTER — Telehealth (HOSPITAL_COMMUNITY): Payer: Self-pay | Admitting: *Deleted

## 2019-02-19 NOTE — Telephone Encounter (Signed)
Have attempted multiple times to contact patient to schedule Prolia appointment.  Have been unsuccessful.  Please advise

## 2019-04-04 NOTE — Progress Notes (Deleted)
Cardiology Office Note   Date:  04/04/2019   ID:  Connie Wood, DOB 1940/07/07, MRN [id number]  PCP:  Celene Squibb, MD  Cardiologist:   Jenkins Rouge, MD   No chief complaint on file.     History of Present Illness: Connie Wood is a 78 y.o. female first seen by me 09/26/16 for vascular disease. She has HTN and HLD Carotid 10/07/16 less than 50% bilateral ICA stenosis Smoker with dyspnea TTE done 05/04/18 EF 65% AV sclerosis Seen in hospital by [facility] for chest pain. Troponin .74 Cath films reviewed no fixed epicardial stenosis ? 50% hazy proximal  LAD ? Coronary dissection EDP 16 mmHg BNP only 393 Rx with heparin and plavix Re admitted 2 days latter with mild volume overload troponin only .09.   Since d/c not compliant with meds and Plavix Discussed need for antiplatelet Rx with SCAD   ***   Past Medical History:  Diagnosis Date  . Back pain   . Cat scratch of right forearm 02/03/2014  . Dyspnea   . Hard of hearing   . Hemorrhoid 04/06/2014  . History of kidney stones   . Hx of degenerative disc disease   . Hypercholesterolemia   . Hypertension   . Migraines   . Osteoarthritis   . Stroke [facility])    mini stroke, resolved in 30 minutes    Past Surgical History:  Procedure Laterality Date  . ABDOMINAL HYSTERECTOMY    . ANTERIOR AND POSTERIOR REPAIR    . APPENDECTOMY    . BILATERAL SALPINGOOPHORECTOMY    . BREAST SURGERY    . CARDIAC CATHETERIZATION    . CHOLECYSTECTOMY    . colonoscopy  2003   Dr. Gala Romney: normal rectum, scattered diverticula  . COLONOSCOPY WITH PROPOFOL N/A 01/01/2016   Procedure: COLONOSCOPY WITH PROPOFOL;  Surgeon: Daneil Dolin, MD;  [facility];  Service: Endoscopy;  Laterality: N/A;  1015  . ERCP with sphincterotomy  2003   Dr. Gala Romney: balloon dredging of bile duct, normal ampulla, choledocholithiasis   . EUS  2016   Dr. Amalia Hailey: subtle 13X17 mm heterogenous lesion in pancreas neck, FNA negative for malignancy   . LEFT  HEART CATH AND CORONARY ANGIOGRAPHY N/A 04/23/2018   Procedure: LEFT HEART CATH AND CORONARY ANGIOGRAPHY;  Surgeon: Belva Crome, MD;  Location: [place] [facility];  Service: Cardiovascular;  Laterality: N/A;  . POLYPECTOMY  01/01/2016   Procedure: POLYPECTOMY;  Surgeon: Daneil Dolin, MD;  [facility];  Service: Endoscopy;;   polypectomies x2-splenic flexure and cecal  . TONSILLECTOMY       Current Outpatient Medications  Medication Sig Dispense Refill  . acetaminophen (TYLENOL) 500 MG tablet Take 2 tablets (1,000 mg total) by mouth every 8 (eight) hours as needed for mild pain, moderate pain or headache.    . albuterol (PROVENTIL HFA;VENTOLIN HFA) 108 (90 Base) MCG/ACT inhaler Inhale 2 puffs into the lungs every 6 (six) hours as needed for wheezing or shortness of breath. 1 Inhaler 0  . aspirin 81 MG chewable tablet Chew 1 tablet (81 mg total) by mouth daily. (Patient taking differently: Chew 81 mg by mouth daily as needed for mild pain or moderate pain. ) 30 tablet 0  . atorvastatin (LIPITOR) 80 MG tablet Take 1 tablet (80 mg total) by mouth daily at 6 PM. 30 tablet 0  . azithromycin (ZITHROMAX) 250 MG tablet Take 2 tablets daily for 3 days 6 each 0  .  AZOR 10-40 MG per tablet Take 1 tablet by mouth daily. Reported on 12/04/2015    . Calcium Citrate (CITRACAL PO) Take 1 tablet by mouth daily.     . Cholecalciferol (VITAMIN D PO) Take 1 tablet by mouth daily.     . clopidogrel (PLAVIX) 75 MG tablet Take 1 tablet (75 mg total) by mouth daily. 30 tablet 0  . furosemide (LASIX) 20 MG tablet Take 1 tablet (20 mg total) by mouth daily. 30 tablet 1  . HYDROcodone-acetaminophen (NORCO) 10-325 MG tablet Take 1 tablet by mouth 3 (three) times daily.     Marland Kitchen loperamide (IMODIUM) 2 MG capsule Take 4 mg by mouth as needed for diarrhea or loose stools.    . metoprolol tartrate (LOPRESSOR) 100 MG tablet Take 1 tablet (100 mg total) by mouth 2 (two) times daily. 60 tablet 0  . ofloxacin  (OCUFLOX) 0.3 % ophthalmic solution Place 2 drops into the right ear daily as needed (for pain). 2 drops in right ear daily prn.    . omeprazole (PRILOSEC) 20 MG capsule Take 20 mg by mouth daily.    Marland Kitchen PARoxetine Mesylate 7.5 MG CAPS Take 7.5 mg by mouth at bedtime.    . potassium chloride (K-DUR,KLOR-CON) 10 MEQ tablet Take 1 tablet (10 mEq total) by mouth daily. 30 tablet 0  . umeclidinium-vilanterol (ANORO ELLIPTA) 62.5-25 MCG/INH AEPB Inhale 1 puff into the lungs daily.    Marland Kitchen venlafaxine XR (EFFEXOR-XR) 75 MG 24 hr capsule Take 150 mg by mouth daily with breakfast.      No current facility-administered medications for this visit.     Allergies:   Adhesive [tape], Doxycycline, and Latex    Social History:  The patient  reports that she has quit smoking. Her smoking use included cigarettes. She has a 50.00 pack-year smoking history. She has never used smokeless tobacco. She reports that she does not drink alcohol or use drugs.   Family History:  The patient's family history includes Cancer in her father, maternal grandmother, and son; Heart disease in her mother and son.    ROS:  Please see the history of present illness.   Otherwise, review of systems are positive for none.   All other systems are reviewed and negative.    PHYSICAL EXAM: VS:  There were no vitals taken for this visit. , BMI There is no height or weight on file to calculate BMI. Affect appropriate Healthy:  appears stated age 43: normal Neck supple with no adenopathy JVP normal no bruits no thyromegaly Lungs clear with no wheezing and good diaphragmatic motion Heart:  S1/S2 no murmur, no rub, gallop or click PMI normal Abdomen: benighn, BS positve, no tenderness, no AAA no bruit.  No HSM or HJR Distal pulses intact with no bruits Trace LE  Edema LLE  Neuro non-focal Skin warm and dry No muscular weakness    EKG:   SR voltage for LVH 03/04/16    Recent Labs: 05/03/2018: ALT 18; B Natriuretic Peptide  393.0; Hemoglobin 11.1; Magnesium 1.8; Platelets 255 05/05/2018: BUN 16; Creatinine, Ser 0.79; Potassium 3.6; Sodium 140    Lipid Panel    Component Value Date/Time   CHOL 202 (H) 04/24/2018 0403   TRIG 186 (H) 04/24/2018 0403   HDL 73 04/24/2018 0403   CHOLHDL 2.8 04/24/2018 0403   VLDL 37 04/24/2018 0403   LDLCALC 92 04/24/2018 0403      Wt Readings from Last 3 Encounters:  06/23/18 138 lb (62.6 kg)  05/05/18  140 lb 10.5 oz (63.8 kg)  04/25/18 138 lb 8 oz (62.8 kg)      Other studies Reviewed: Additional studies/ records that were reviewed today include: Notes Dr Nevada Crane and Dr Harl Bowie previous echo and carotid duplex .    ASSESSMENT AND PLAN:  1.HTN Well controlled.  Continue current medications and low sodium Dash type diet.   2. Cholesterol  LDL 115 on statin *** 3. PVD/Carotid  Duplex 10/07/16 Less than 50% ICA stenosis f/u duplex ordered  4. Dyspnea COPD and smoking normal examTTE 05/04/18 EF normal no valve disease  5. Edema dependant venous support hose and PRN diuretic 6. Smoking quit this summer f/u yearly CXR NAD 05/05/18  7. CAD: ? SCAD 04/23/18 Rx medically Discussed need for compliance with ASA/Plavix   F/U in [place] 6 months  Carotid duplex ordered     Signed, Jenkins Rouge, MD  04/04/2019 9:48 AM    [facility] [place], [place],   [address] Phone: [phone]; Fax: [phone]

## 2019-04-05 ENCOUNTER — Ambulatory Visit (INDEPENDENT_AMBULATORY_CARE_PROVIDER_SITE_OTHER): Payer: Medicare Other | Admitting: Otolaryngology

## 2019-04-06 ENCOUNTER — Ambulatory Visit: Payer: Medicare Other | Admitting: Cardiovascular Disease

## 2019-04-15 ENCOUNTER — Ambulatory Visit (INDEPENDENT_AMBULATORY_CARE_PROVIDER_SITE_OTHER): Payer: Medicare Other | Admitting: Otolaryngology

## 2019-04-15 ENCOUNTER — Other Ambulatory Visit: Payer: Self-pay

## 2019-04-15 DIAGNOSIS — H7203 Central perforation of tympanic membrane, bilateral: Secondary | ICD-10-CM

## 2019-04-15 DIAGNOSIS — H906 Mixed conductive and sensorineural hearing loss, bilateral: Secondary | ICD-10-CM

## 2019-04-15 DIAGNOSIS — H9313 Tinnitus, bilateral: Secondary | ICD-10-CM | POA: Diagnosis not present

## 2019-06-09 NOTE — Progress Notes (Deleted)
Cardiology Office Note   Date:  06/09/2019   ID:  [name], DOB 18-Sep-1940, MRN [id number]  PCP:  Celene Squibb, MD  Cardiologist:   Jenkins Rouge, MD   No chief complaint on file.     History of Present Illness: Connie Wood is a 78 y.o. female first seen by me 09/26/16 for vascular disease. She has HTN and HLD Carotid 10/07/16 less than 50% bilateral ICA stenosis Smoker quit last year  with dyspnea TTE done 05/04/18 EF 65% AV sclerosis Seen in hospital by [facility] for chest pain.04/23/18  Troponin .74 Cath films reviewed no fixed epicardial stenosis ? 50% hazy proximal  LAD ? Coronary dissection EDP 16 mmHg BNP only 393 Rx with heparin and plavix Re admitted 2 days latter with mild volume overload troponin only .09.   Previously not compliant with DAT  ***   Past Medical History:  Diagnosis Date  . Back pain   . Cat scratch of right forearm 02/03/2014  . Dyspnea   . Hard of hearing   . Hemorrhoid 04/06/2014  . History of kidney stones   . Hx of degenerative disc disease   . Hypercholesterolemia   . Hypertension   . Migraines   . Osteoarthritis   . Stroke [facility])    mini stroke, resolved in 30 minutes    Past Surgical History:  Procedure Laterality Date  . ABDOMINAL HYSTERECTOMY    . ANTERIOR AND POSTERIOR REPAIR    . APPENDECTOMY    . BILATERAL SALPINGOOPHORECTOMY    . BREAST SURGERY    . CARDIAC CATHETERIZATION    . CHOLECYSTECTOMY    . colonoscopy  2003   Dr. Gala Romney: normal rectum, scattered diverticula  . COLONOSCOPY WITH PROPOFOL N/A 01/01/2016   Procedure: COLONOSCOPY WITH PROPOFOL;  Surgeon: Daneil Dolin, MD;  [facility];  Service: Endoscopy;  Laterality: N/A;  1015  . ERCP with sphincterotomy  2003   Dr. Gala Romney: balloon dredging of bile duct, normal ampulla, choledocholithiasis   . EUS  2016   Dr. Amalia Hailey: subtle 13X17 mm heterogenous lesion in pancreas neck, FNA negative for malignancy   . LEFT HEART CATH AND CORONARY  ANGIOGRAPHY N/A 04/23/2018   Procedure: LEFT HEART CATH AND CORONARY ANGIOGRAPHY;  Surgeon: Belva Crome, MD;  Location: [place] [facility];  Service: Cardiovascular;  Laterality: N/A;  . POLYPECTOMY  01/01/2016   Procedure: POLYPECTOMY;  Surgeon: Daneil Dolin, MD;  [facility];  Service: Endoscopy;;   polypectomies x2-splenic flexure and cecal  . TONSILLECTOMY       Current Outpatient Medications  Medication Sig Dispense Refill  . acetaminophen (TYLENOL) 500 MG tablet Take 2 tablets (1,000 mg total) by mouth every 8 (eight) hours as needed for mild pain, moderate pain or headache.    . albuterol (PROVENTIL HFA;VENTOLIN HFA) 108 (90 Base) MCG/ACT inhaler Inhale 2 puffs into the lungs every 6 (six) hours as needed for wheezing or shortness of breath. 1 Inhaler 0  . aspirin 81 MG chewable tablet Chew 1 tablet (81 mg total) by mouth daily. (Patient taking differently: Chew 81 mg by mouth daily as needed for mild pain or moderate pain. ) 30 tablet 0  . atorvastatin (LIPITOR) 80 MG tablet Take 1 tablet (80 mg total) by mouth daily at 6 PM. 30 tablet 0  . azithromycin (ZITHROMAX) 250 MG tablet Take 2 tablets daily for 3 days 6 each 0  . AZOR 10-40 MG per tablet  Take 1 tablet by mouth daily. Reported on 12/04/2015    . Calcium Citrate (CITRACAL PO) Take 1 tablet by mouth daily.     . Cholecalciferol (VITAMIN D PO) Take 1 tablet by mouth daily.     . clopidogrel (PLAVIX) 75 MG tablet Take 1 tablet (75 mg total) by mouth daily. 30 tablet 0  . furosemide (LASIX) 20 MG tablet Take 1 tablet (20 mg total) by mouth daily. 30 tablet 1  . HYDROcodone-acetaminophen (NORCO) 10-325 MG tablet Take 1 tablet by mouth 3 (three) times daily.     Marland Kitchen loperamide (IMODIUM) 2 MG capsule Take 4 mg by mouth as needed for diarrhea or loose stools.    . metoprolol tartrate (LOPRESSOR) 100 MG tablet Take 1 tablet (100 mg total) by mouth 2 (two) times daily. 60 tablet 0  . ofloxacin (OCUFLOX) 0.3 % ophthalmic  solution Place 2 drops into the right ear daily as needed (for pain). 2 drops in right ear daily prn.    . omeprazole (PRILOSEC) 20 MG capsule Take 20 mg by mouth daily.    Marland Kitchen PARoxetine Mesylate 7.5 MG CAPS Take 7.5 mg by mouth at bedtime.    . potassium chloride (K-DUR,KLOR-CON) 10 MEQ tablet Take 1 tablet (10 mEq total) by mouth daily. 30 tablet 0  . umeclidinium-vilanterol (ANORO ELLIPTA) 62.5-25 MCG/INH AEPB Inhale 1 puff into the lungs daily.    Marland Kitchen venlafaxine XR (EFFEXOR-XR) 75 MG 24 hr capsule Take 150 mg by mouth daily with breakfast.      No current facility-administered medications for this visit.    Allergies:   Adhesive [tape], Doxycycline, and Latex    Social History:  The patient  reports that she has quit smoking. Her smoking use included cigarettes. She has a 50.00 pack-year smoking history. She has never used smokeless tobacco. She reports that she does not drink alcohol or use drugs.   Family History:  The patient's family history includes Cancer in her father, maternal grandmother, and son; Heart disease in her mother and son.    ROS:  Please see the history of present illness.   Otherwise, review of systems are positive for none.   All other systems are reviewed and negative.    PHYSICAL EXAM: VS:  There were no vitals taken for this visit. , BMI There is no height or weight on file to calculate BMI.   Affect appropriate Healthy:  appears stated age 62: normal Neck supple with no adenopathy JVP normal no bruits no thyromegaly Lungs clear with no wheezing and good diaphragmatic motion Heart:  S1/S2 no murmur, no rub, gallop or click PMI normal Abdomen: benighn, BS positve, no tenderness, no AAA no bruit.  No HSM or HJR Distal pulses intact with no bruits Trace LE  Edema LLE  Neuro non-focal Skin warm and dry No muscular weakness    EKG:   SR voltage for LVH 03/04/16    Recent Labs: No results found for requested labs within last 8760 hours.     Lipid Panel    Component Value Date/Time   CHOL 202 (H) 04/24/2018 0403   TRIG 186 (H) 04/24/2018 0403   HDL 73 04/24/2018 0403   CHOLHDL 2.8 04/24/2018 0403   VLDL 37 04/24/2018 0403   LDLCALC 92 04/24/2018 0403      Wt Readings from Last 3 Encounters:  06/23/18 138 lb (62.6 kg)  05/05/18 140 lb 10.5 oz (63.8 kg)  04/25/18 138 lb 8 oz (62.8 kg)  Other studies Reviewed: Additional studies/ records that were reviewed today include: Notes Dr Nevada Crane and Dr Harl Bowie previous echo and carotid duplex .    ASSESSMENT AND PLAN:  1.HTN Well controlled.  Continue current medications and low sodium Dash type diet.    2. Cholesterol diet RX LDL 115   3. PVD/Carotid  Duplex 10/07/16 Less than 50% ICA stenosis f/u duplex ordered   4. Dyspnea COPD and smoking normal examTTE 05/04/18 EF normal no valve disease   5. Edema dependant venous support hose and PRN diuretic  6. Smoking quit this summer f/u yearly CXR NAD 05/05/18 lung cancer screening CT ordered   7. CAD: ? SCAD 04/23/18 Rx medically Discussed need for compliance with ASA/Plavix   F/U in [place] 6 months  Carotid duplex ordered     Signed, Jenkins Rouge, MD  06/09/2019 11:47 AM    [facility] [place], [place], [place]  [address] Phone: [phone]; Fax: [phone]

## 2019-06-15 ENCOUNTER — Ambulatory Visit: Payer: Medicare Other | Admitting: Cardiovascular Disease

## 2019-06-15 ENCOUNTER — Encounter: Payer: Self-pay | Admitting: Cardiovascular Disease

## 2019-07-08 DIAGNOSIS — R7301 Impaired fasting glucose: Secondary | ICD-10-CM | POA: Diagnosis not present

## 2019-07-08 DIAGNOSIS — I10 Essential (primary) hypertension: Secondary | ICD-10-CM | POA: Diagnosis not present

## 2019-07-08 DIAGNOSIS — D649 Anemia, unspecified: Secondary | ICD-10-CM | POA: Diagnosis not present

## 2019-07-08 DIAGNOSIS — E782 Mixed hyperlipidemia: Secondary | ICD-10-CM | POA: Diagnosis not present

## 2019-07-08 DIAGNOSIS — Z1329 Encounter for screening for other suspected endocrine disorder: Secondary | ICD-10-CM | POA: Diagnosis not present

## 2019-07-21 DIAGNOSIS — E782 Mixed hyperlipidemia: Secondary | ICD-10-CM | POA: Diagnosis not present

## 2019-07-21 DIAGNOSIS — E875 Hyperkalemia: Secondary | ICD-10-CM | POA: Diagnosis not present

## 2019-07-21 DIAGNOSIS — R7301 Impaired fasting glucose: Secondary | ICD-10-CM | POA: Diagnosis not present

## 2019-07-21 DIAGNOSIS — R945 Abnormal results of liver function studies: Secondary | ICD-10-CM | POA: Diagnosis not present

## 2019-07-21 DIAGNOSIS — R944 Abnormal results of kidney function studies: Secondary | ICD-10-CM | POA: Diagnosis not present

## 2019-07-21 DIAGNOSIS — G509 Disorder of trigeminal nerve, unspecified: Secondary | ICD-10-CM | POA: Diagnosis not present

## 2019-07-21 DIAGNOSIS — M1990 Unspecified osteoarthritis, unspecified site: Secondary | ICD-10-CM | POA: Diagnosis not present

## 2019-07-21 DIAGNOSIS — J449 Chronic obstructive pulmonary disease, unspecified: Secondary | ICD-10-CM | POA: Diagnosis not present

## 2019-07-21 DIAGNOSIS — M542 Cervicalgia: Secondary | ICD-10-CM | POA: Diagnosis not present

## 2019-07-27 DIAGNOSIS — F5104 Psychophysiologic insomnia: Secondary | ICD-10-CM | POA: Diagnosis not present

## 2019-07-27 DIAGNOSIS — R419 Unspecified symptoms and signs involving cognitive functions and awareness: Secondary | ICD-10-CM | POA: Diagnosis not present

## 2019-07-27 DIAGNOSIS — S51011A Laceration without foreign body of right elbow, initial encounter: Secondary | ICD-10-CM | POA: Diagnosis not present

## 2019-07-27 DIAGNOSIS — S0083XA Contusion of other part of head, initial encounter: Secondary | ICD-10-CM | POA: Diagnosis not present

## 2019-08-03 DIAGNOSIS — Z79891 Long term (current) use of opiate analgesic: Secondary | ICD-10-CM | POA: Diagnosis not present

## 2019-09-02 NOTE — Addendum Note (Signed)
Encounter addended by: Holley Dexter, RN on: 09/02/2019 12:18 PM  Actions taken: Clinical Note Signed

## 2019-09-02 NOTE — Progress Notes (Signed)
Have attempted multiple times to schedule prolia appointment.  Patient is past due and has not had an injection since October 2019.  Message left for Dr. Nevada Crane.

## 2019-09-07 ENCOUNTER — Telehealth (HOSPITAL_COMMUNITY): Payer: Self-pay | Admitting: *Deleted

## 2019-09-07 NOTE — Telephone Encounter (Signed)
Have attempted multiple times to schedule prolia without success.

## 2019-09-28 DIAGNOSIS — F0391 Unspecified dementia with behavioral disturbance: Secondary | ICD-10-CM | POA: Diagnosis not present

## 2019-09-28 DIAGNOSIS — H9319 Tinnitus, unspecified ear: Secondary | ICD-10-CM | POA: Diagnosis not present

## 2019-09-28 DIAGNOSIS — R419 Unspecified symptoms and signs involving cognitive functions and awareness: Secondary | ICD-10-CM | POA: Diagnosis not present

## 2019-09-28 DIAGNOSIS — M545 Low back pain: Secondary | ICD-10-CM | POA: Diagnosis not present

## 2019-09-28 DIAGNOSIS — F5104 Psychophysiologic insomnia: Secondary | ICD-10-CM | POA: Diagnosis not present

## 2019-09-28 DIAGNOSIS — R441 Visual hallucinations: Secondary | ICD-10-CM | POA: Diagnosis not present

## 2019-10-18 DIAGNOSIS — I214 Non-ST elevation (NSTEMI) myocardial infarction: Secondary | ICD-10-CM | POA: Diagnosis not present

## 2019-10-18 DIAGNOSIS — E782 Mixed hyperlipidemia: Secondary | ICD-10-CM | POA: Diagnosis not present

## 2019-10-18 DIAGNOSIS — I10 Essential (primary) hypertension: Secondary | ICD-10-CM | POA: Diagnosis not present

## 2019-10-18 DIAGNOSIS — F064 Anxiety disorder due to known physiological condition: Secondary | ICD-10-CM | POA: Diagnosis not present

## 2019-10-26 DIAGNOSIS — F0391 Unspecified dementia with behavioral disturbance: Secondary | ICD-10-CM | POA: Diagnosis not present

## 2019-10-26 DIAGNOSIS — F5104 Psychophysiologic insomnia: Secondary | ICD-10-CM | POA: Diagnosis not present

## 2019-10-26 DIAGNOSIS — R441 Visual hallucinations: Secondary | ICD-10-CM | POA: Diagnosis not present

## 2019-10-26 DIAGNOSIS — M545 Low back pain: Secondary | ICD-10-CM | POA: Diagnosis not present

## 2019-10-26 DIAGNOSIS — R61 Generalized hyperhidrosis: Secondary | ICD-10-CM | POA: Diagnosis not present

## 2019-10-26 DIAGNOSIS — R419 Unspecified symptoms and signs involving cognitive functions and awareness: Secondary | ICD-10-CM | POA: Diagnosis not present

## 2019-10-26 DIAGNOSIS — H9319 Tinnitus, unspecified ear: Secondary | ICD-10-CM | POA: Diagnosis not present

## 2019-10-29 DIAGNOSIS — I10 Essential (primary) hypertension: Secondary | ICD-10-CM | POA: Diagnosis not present

## 2019-10-29 DIAGNOSIS — E785 Hyperlipidemia, unspecified: Secondary | ICD-10-CM | POA: Diagnosis not present

## 2019-10-29 DIAGNOSIS — M545 Low back pain: Secondary | ICD-10-CM | POA: Diagnosis not present

## 2019-10-29 DIAGNOSIS — R441 Visual hallucinations: Secondary | ICD-10-CM | POA: Diagnosis not present

## 2019-10-29 DIAGNOSIS — F0391 Unspecified dementia with behavioral disturbance: Secondary | ICD-10-CM | POA: Diagnosis not present

## 2019-10-29 DIAGNOSIS — R419 Unspecified symptoms and signs involving cognitive functions and awareness: Secondary | ICD-10-CM | POA: Diagnosis not present

## 2019-10-29 DIAGNOSIS — F5104 Psychophysiologic insomnia: Secondary | ICD-10-CM | POA: Diagnosis not present

## 2019-10-29 DIAGNOSIS — H9319 Tinnitus, unspecified ear: Secondary | ICD-10-CM | POA: Diagnosis not present

## 2019-10-29 DIAGNOSIS — M150 Primary generalized (osteo)arthritis: Secondary | ICD-10-CM | POA: Diagnosis not present

## 2019-11-18 DIAGNOSIS — M545 Low back pain: Secondary | ICD-10-CM | POA: Diagnosis not present

## 2019-11-26 DIAGNOSIS — H9319 Tinnitus, unspecified ear: Secondary | ICD-10-CM | POA: Diagnosis not present

## 2019-11-26 DIAGNOSIS — M545 Low back pain: Secondary | ICD-10-CM | POA: Diagnosis not present

## 2019-11-26 DIAGNOSIS — R441 Visual hallucinations: Secondary | ICD-10-CM | POA: Diagnosis not present

## 2019-11-26 DIAGNOSIS — F0391 Unspecified dementia with behavioral disturbance: Secondary | ICD-10-CM | POA: Diagnosis not present

## 2019-11-26 DIAGNOSIS — I10 Essential (primary) hypertension: Secondary | ICD-10-CM | POA: Diagnosis not present

## 2019-11-26 DIAGNOSIS — R419 Unspecified symptoms and signs involving cognitive functions and awareness: Secondary | ICD-10-CM | POA: Diagnosis not present

## 2019-11-26 DIAGNOSIS — M150 Primary generalized (osteo)arthritis: Secondary | ICD-10-CM | POA: Diagnosis not present

## 2019-11-26 DIAGNOSIS — F5104 Psychophysiologic insomnia: Secondary | ICD-10-CM | POA: Diagnosis not present

## 2019-11-26 DIAGNOSIS — E785 Hyperlipidemia, unspecified: Secondary | ICD-10-CM | POA: Diagnosis not present

## 2019-12-14 IMAGING — CT CT ANGIO CHEST
2 of 6 series · 19 of 46 positions shown · IV contrast (Isovue)
Comparison: 04/23/2018 CT chest.

CLINICAL DATA: 77 y/o F; increasing shortness of breath. Recent
cardiac catheterization. PE suspected, high pretest prob

EXAM:
CT ANGIOGRAPHY CHEST WITH CONTRAST
TECHNIQUE: Multidetector CT imaging of the chest was performed using the
standard protocol during bolus administration of intravenous
contrast. Multiplanar CT image reconstructions and MIPs were
obtained to evaluate the vascular anatomy.
CONTRAST:  75mL T3AJ7O-CDQ IOPAMIDOL (T3AJ7O-CDQ) INJECTION 76%

[Series 6: thins · axial · 0.59mm/px · z∈[-278,-17]mm · 16 of 287 slices shown]
[im 13/287  lung]
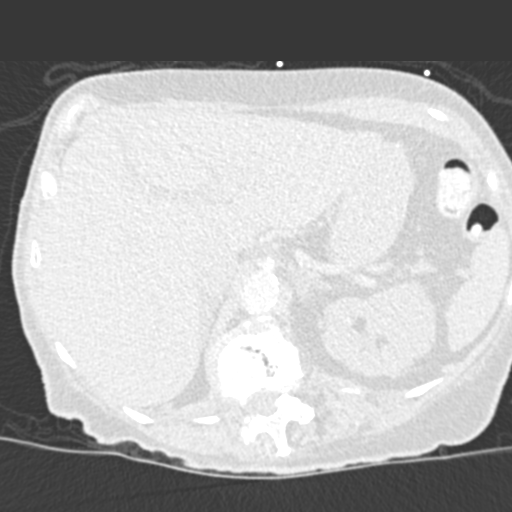
[im 38/287  soft-tissue]
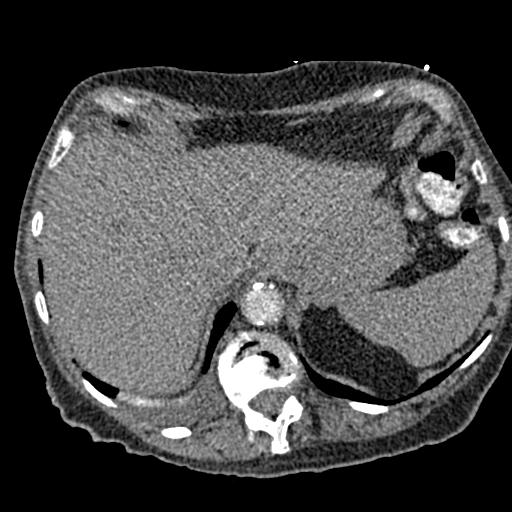
[im 50/287  lung]
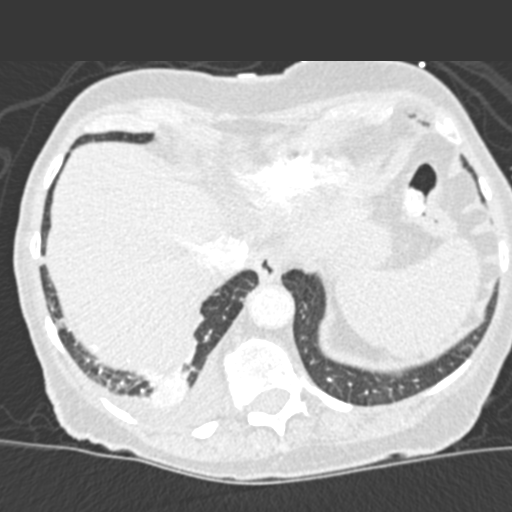
[im 63/287  soft-tissue]
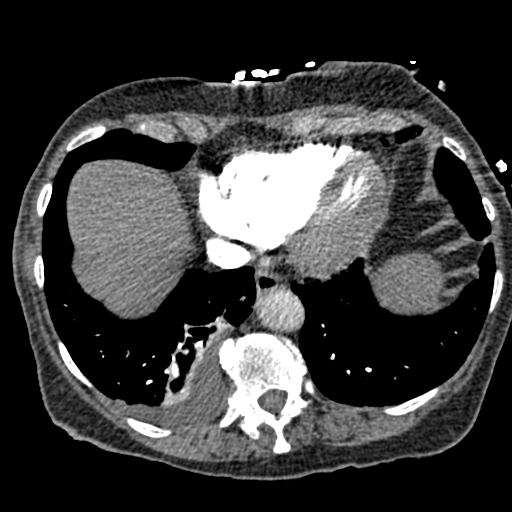
[im 88/287  lung]
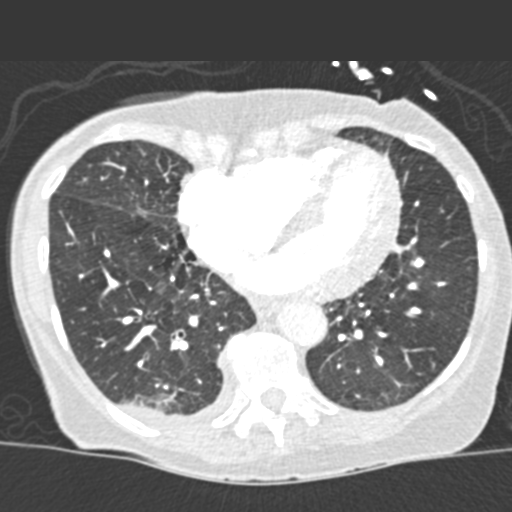
[im 100/287  soft-tissue]
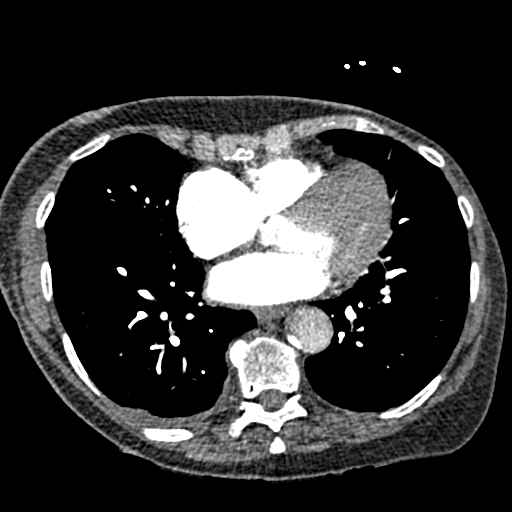
[im 112/287  lung]
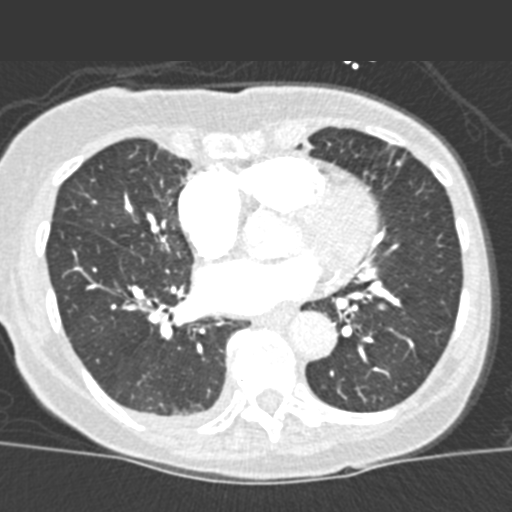
[im 137/287  soft-tissue]
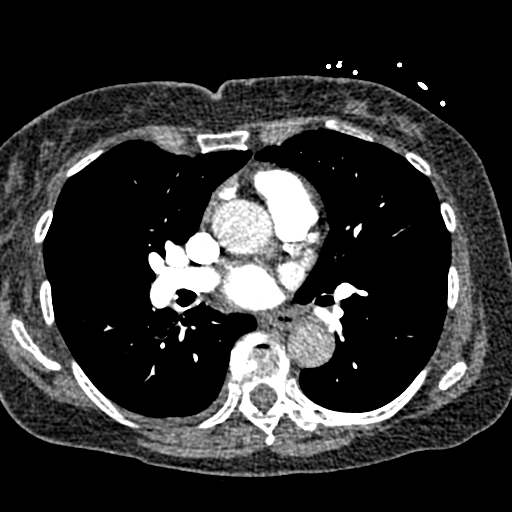
[im 150/287  lung]
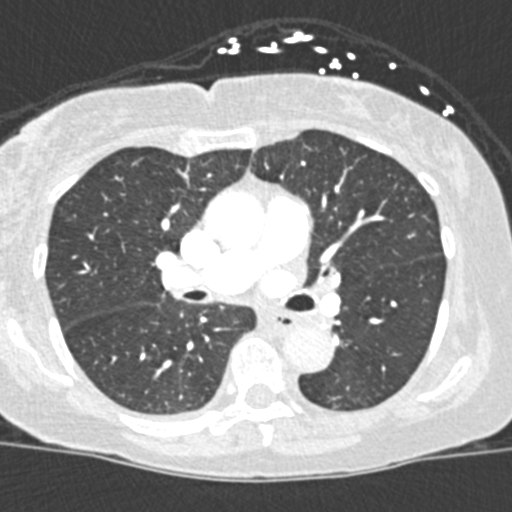
[im 175/287  soft-tissue]
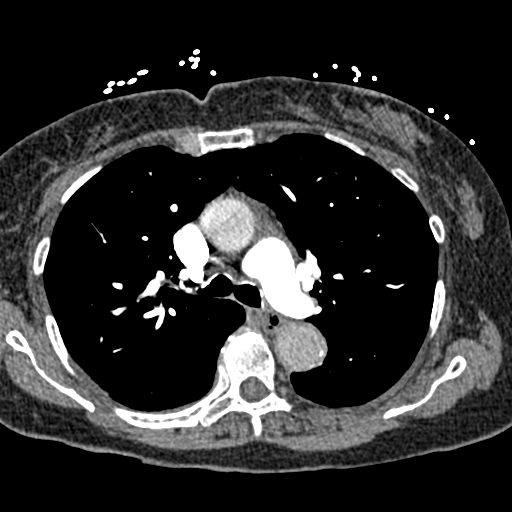
[im 187/287  lung]
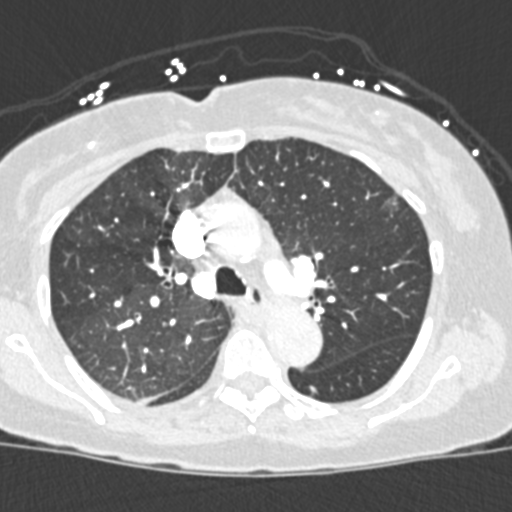
[im 199/287  soft-tissue]
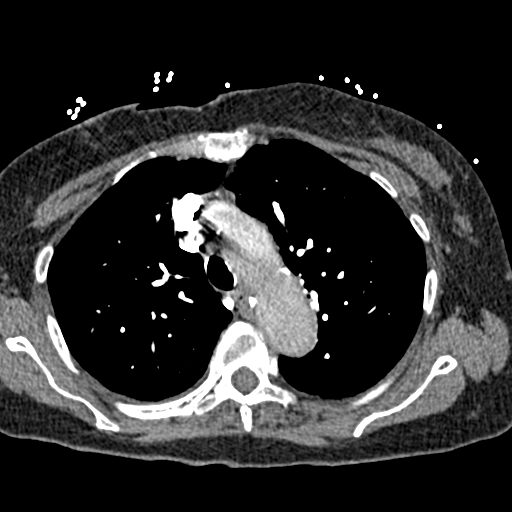
[im 224/287  lung]
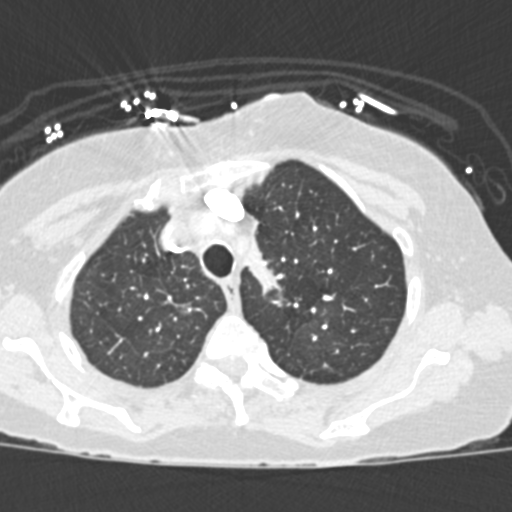
[im 237/287  soft-tissue]
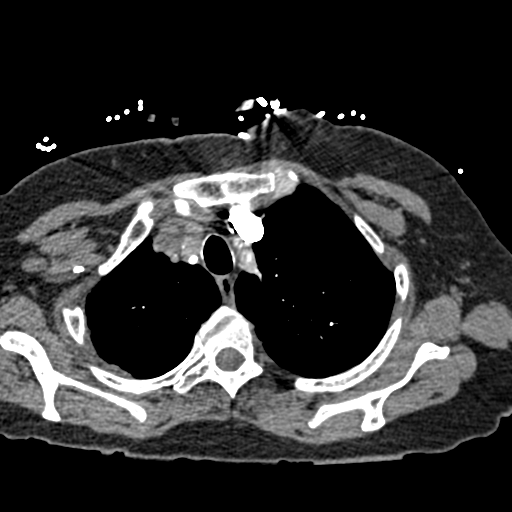
[im 249/287  lung]
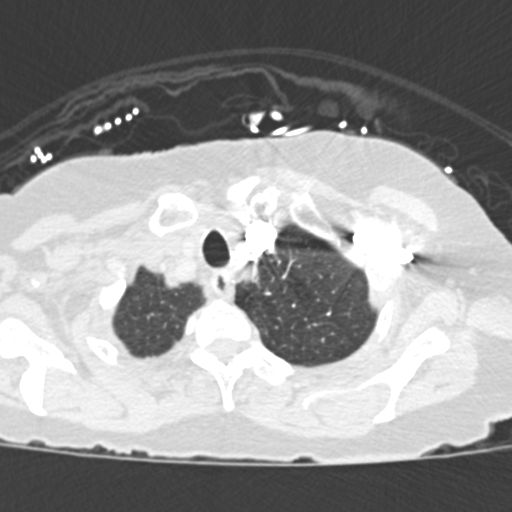
[im 274/287  soft-tissue]
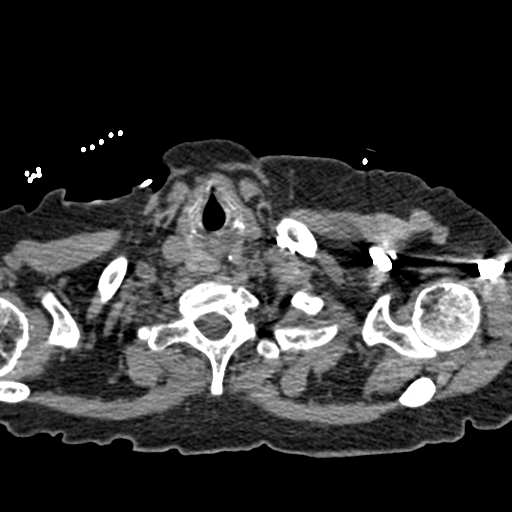

[Series 8: coronal mpr · coronal · 0.63mm/px · 3 of 135 slices shown]
[im 34/135  soft-tissue]
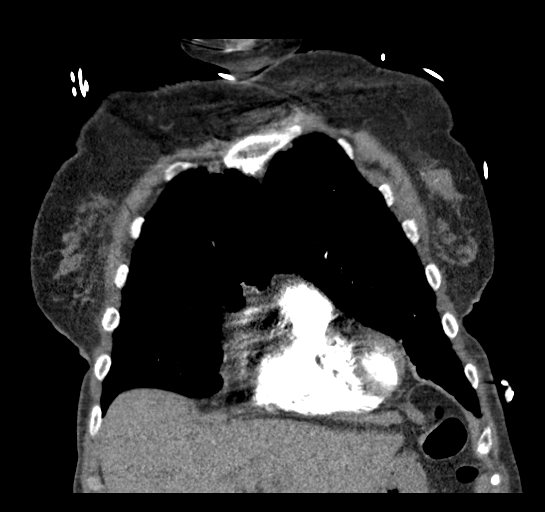
[im 68/135  soft-tissue]
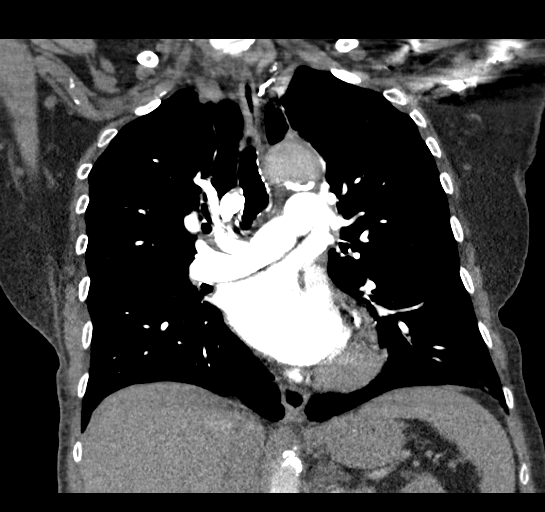
[im 101/135  soft-tissue]
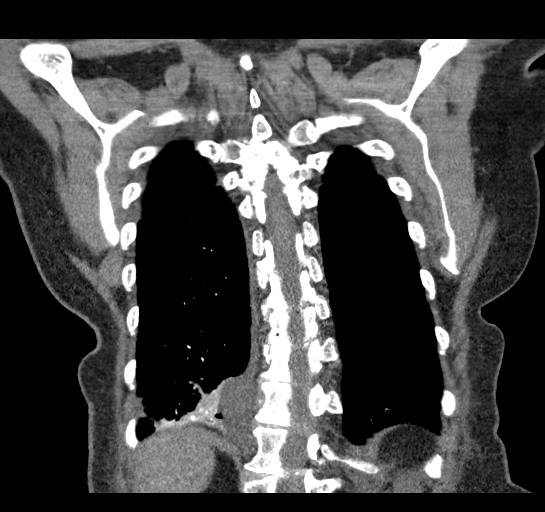

[19 of 46 positions shown; findings below may reference images not displayed]

FINDINGS: Cardiovascular: Satisfactory opacification of the pulmonary arteries
to the segmental level. No evidence of pulmonary embolism. Normal
heart size. No pericardial effusion. Moderate to severe coronary
artery and aortic calcific atherosclerosis.

Mediastinum/Nodes: No enlarged mediastinal, hilar, or axillary lymph
nodes. Thyroid gland, trachea, and esophagus demonstrate no
significant findings.

Lungs/Pleura: Mild interlobular septal thickening. Faint
ground-glass opacification of the lungs with areas of sparing.
Dependent right lower lobe small subsegmental consolidation. Small
right pleural effusion. No pneumothorax.

Upper Abdomen: No acute abnormality.

Musculoskeletal: No chest wall abnormality. No acute osseous
findings. Moderate multilevel discogenic degenerative changes of the
thoracic spine.

Review of the MIP images confirms the above findings.
IMPRESSION: 1. No pulmonary embolus identified.
2. Interstitial pulmonary edema. Small right pleural effusion.
3. Dependent right lower lobe subsegmental consolidation may
represent atelectasis or pneumonia.
4. Moderate to severe coronary artery and aortic calcific

## 2020-01-05 DIAGNOSIS — F0391 Unspecified dementia with behavioral disturbance: Secondary | ICD-10-CM | POA: Diagnosis not present

## 2020-01-05 DIAGNOSIS — H9319 Tinnitus, unspecified ear: Secondary | ICD-10-CM | POA: Diagnosis not present

## 2020-01-05 DIAGNOSIS — F5104 Psychophysiologic insomnia: Secondary | ICD-10-CM | POA: Diagnosis not present

## 2020-01-05 DIAGNOSIS — R419 Unspecified symptoms and signs involving cognitive functions and awareness: Secondary | ICD-10-CM | POA: Diagnosis not present

## 2020-01-05 DIAGNOSIS — E785 Hyperlipidemia, unspecified: Secondary | ICD-10-CM | POA: Diagnosis not present

## 2020-01-05 DIAGNOSIS — M150 Primary generalized (osteo)arthritis: Secondary | ICD-10-CM | POA: Diagnosis not present

## 2020-01-05 DIAGNOSIS — R441 Visual hallucinations: Secondary | ICD-10-CM | POA: Diagnosis not present

## 2020-01-05 DIAGNOSIS — M545 Low back pain: Secondary | ICD-10-CM | POA: Diagnosis not present

## 2020-01-05 DIAGNOSIS — I10 Essential (primary) hypertension: Secondary | ICD-10-CM | POA: Diagnosis not present

## 2020-01-06 DIAGNOSIS — M545 Low back pain: Secondary | ICD-10-CM | POA: Diagnosis not present

## 2020-01-06 DIAGNOSIS — H903 Sensorineural hearing loss, bilateral: Secondary | ICD-10-CM | POA: Diagnosis not present

## 2020-01-06 DIAGNOSIS — F0391 Unspecified dementia with behavioral disturbance: Secondary | ICD-10-CM | POA: Diagnosis not present

## 2020-02-03 DIAGNOSIS — F0391 Unspecified dementia with behavioral disturbance: Secondary | ICD-10-CM | POA: Diagnosis not present

## 2020-02-03 DIAGNOSIS — R441 Visual hallucinations: Secondary | ICD-10-CM | POA: Diagnosis not present

## 2020-02-03 DIAGNOSIS — E785 Hyperlipidemia, unspecified: Secondary | ICD-10-CM | POA: Diagnosis not present

## 2020-02-03 DIAGNOSIS — M150 Primary generalized (osteo)arthritis: Secondary | ICD-10-CM | POA: Diagnosis not present

## 2020-02-03 DIAGNOSIS — R419 Unspecified symptoms and signs involving cognitive functions and awareness: Secondary | ICD-10-CM | POA: Diagnosis not present

## 2020-02-03 DIAGNOSIS — M545 Low back pain: Secondary | ICD-10-CM | POA: Diagnosis not present

## 2020-02-03 DIAGNOSIS — F5104 Psychophysiologic insomnia: Secondary | ICD-10-CM | POA: Diagnosis not present

## 2020-02-03 DIAGNOSIS — H9319 Tinnitus, unspecified ear: Secondary | ICD-10-CM | POA: Diagnosis not present

## 2020-02-03 DIAGNOSIS — I10 Essential (primary) hypertension: Secondary | ICD-10-CM | POA: Diagnosis not present

## 2020-03-01 DIAGNOSIS — F0391 Unspecified dementia with behavioral disturbance: Secondary | ICD-10-CM | POA: Diagnosis not present

## 2020-03-01 DIAGNOSIS — I10 Essential (primary) hypertension: Secondary | ICD-10-CM | POA: Diagnosis not present

## 2020-03-01 DIAGNOSIS — R419 Unspecified symptoms and signs involving cognitive functions and awareness: Secondary | ICD-10-CM | POA: Diagnosis not present

## 2020-03-01 DIAGNOSIS — M545 Low back pain: Secondary | ICD-10-CM | POA: Diagnosis not present

## 2020-03-01 DIAGNOSIS — R441 Visual hallucinations: Secondary | ICD-10-CM | POA: Diagnosis not present

## 2020-03-01 DIAGNOSIS — M150 Primary generalized (osteo)arthritis: Secondary | ICD-10-CM | POA: Diagnosis not present

## 2020-03-01 DIAGNOSIS — H9319 Tinnitus, unspecified ear: Secondary | ICD-10-CM | POA: Diagnosis not present

## 2020-03-01 DIAGNOSIS — E7849 Other hyperlipidemia: Secondary | ICD-10-CM | POA: Diagnosis not present

## 2020-03-01 DIAGNOSIS — F5104 Psychophysiologic insomnia: Secondary | ICD-10-CM | POA: Diagnosis not present

## 2020-03-05 ENCOUNTER — Ambulatory Visit
Admission: EM | Admit: 2020-03-05 | Discharge: 2020-03-05 | Disposition: A | Discharge status: Home / Self Care | Payer: Medicare HMO | Attending: Emergency Medicine | Admitting: Emergency Medicine

## 2020-03-05 ENCOUNTER — Encounter: Payer: Self-pay | Admitting: Emergency Medicine

## 2020-03-05 DIAGNOSIS — J069 Acute upper respiratory infection, unspecified: Secondary | ICD-10-CM

## 2020-03-05 DIAGNOSIS — Z1152 Encounter for screening for COVID-19: Secondary | ICD-10-CM

## 2020-03-05 DIAGNOSIS — Z20822 Contact with and (suspected) exposure to covid-19: Secondary | ICD-10-CM

## 2020-03-05 DIAGNOSIS — R197 Diarrhea, unspecified: Secondary | ICD-10-CM | POA: Diagnosis not present

## 2020-03-05 DIAGNOSIS — R531 Weakness: Secondary | ICD-10-CM | POA: Diagnosis not present

## 2020-03-05 MED ORDER — FLUTICASONE PROPIONATE 50 MCG/ACT NA SUSP: 1.0000 | Freq: Every day | NASAL | 0 refills | Status: AC

## 2020-03-05 MED ORDER — BENZONATATE 100 MG PO CAPS: 100.0000 mg | ORAL_CAPSULE | Freq: Three times a day (TID) | ORAL | 0 refills | Status: DC

## 2020-03-05 NOTE — ED Triage Notes (Signed)
Patient states that she has some cough, sinus congestion, runny nose, feels weak, get nasuea/ diarrhea when she eats x couple weeks, recent weight loss

## 2020-03-05 NOTE — Discharge Instructions (Addendum)
COVID testing ordered.  It will take between 2-7 days for test results.  Someone will contact you regarding abnormal results.    In the meantime: You should remain isolated in your home for 10 days from symptom onset AND greater than 24 hours after symptoms resolution (absence of fever without the use of fever-reducing medication and improvement in respiratory symptoms), whichever is longer Get plenty of rest and push fluids Tessalon Perles prescribed for cough Flonase was prescribed for nasal congestion Increase oral intake to replace fluid loss through diarrhea Use medications daily for symptom relief Use OTC medications like ibuprofen or tylenol as needed fever or pain Call or go to the ED if you have any new or worsening symptoms such as fever, worsening cough, shortness of breath, chest tightness, chest pain, turning blue, changes in mental status, etc.

## 2020-03-05 NOTE — ED Provider Notes (Signed)
Connie Wood   [id number] 03/05/20 Arrival Time: 7   CC: COVID symptoms  SUBJECTIVE: History from: patient and family.  Connie Wood is a 79 y.o. female who presents to the urgent care with a complaint of cough, sinus congestion, runny nose, diarrhea and weakness for the past few days.  Denies sick exposure to COVID, flu or strep.  Denies recent travel.  Has tried OTC medication without relief.  Denies aggravating factor.  Denies similar symptoms in the past.   Denies fever, chills, fatigue, sinus pain, rhinorrhea, sore throat, SOB, wheezing, chest pain, nausea, changes in bowel or bladder habits.    ROS: As per HPI.  All other pertinent ROS negative.     Past Medical History:  Diagnosis Date  . Back pain   . Cat scratch of right forearm 02/03/2014  . Dyspnea   . Hard of hearing   . Hemorrhoid 04/06/2014  . History of kidney stones   . Hx of degenerative disc disease   . Hypercholesterolemia   . Hypertension   . Migraines   . Osteoarthritis   . Stroke [facility])    mini stroke, resolved in 30 minutes   Past Surgical History:  Procedure Laterality Date  . ABDOMINAL HYSTERECTOMY    . ANTERIOR AND POSTERIOR REPAIR    . APPENDECTOMY    . BILATERAL SALPINGOOPHORECTOMY    . BREAST SURGERY    . CARDIAC CATHETERIZATION    . CHOLECYSTECTOMY    . colonoscopy  2003   Dr. Gala Romney: normal rectum, scattered diverticula  . COLONOSCOPY WITH PROPOFOL N/A 01/01/2016   Procedure: COLONOSCOPY WITH PROPOFOL;  Surgeon: Daneil Dolin, MD;  Location: [facility];  Service: Endoscopy;  Laterality: N/A;  1015  . ERCP with sphincterotomy  2003   Dr. Gala Romney: balloon dredging of bile duct, normal ampulla, choledocholithiasis   . EUS  2016   Dr. Amalia Hailey: subtle 13X17 mm heterogenous lesion in pancreas neck, FNA negative for malignancy   . LEFT HEART CATH AND CORONARY ANGIOGRAPHY N/A 04/23/2018   Procedure: LEFT HEART CATH AND CORONARY ANGIOGRAPHY;  Surgeon: Belva Crome, MD;   Location: [facility];  Service: Cardiovascular;  Laterality: N/A;  . POLYPECTOMY  01/01/2016   Procedure: POLYPECTOMY;  Surgeon: Daneil Dolin, MD;  Location: [facility];  Service: Endoscopy;;   polypectomies x2-splenic flexure and cecal  . TONSILLECTOMY     Allergies  Allergen Reactions  . Adhesive [Tape] Other (See Comments)    Tears skin  . Doxycycline Nausea And Vomiting  . Latex Itching and Rash   No current facility-administered medications on file prior to encounter.   Current Outpatient Medications on File Prior to Encounter  Medication Sig Dispense Refill  . acetaminophen (TYLENOL) 500 MG tablet Take 2 tablets (1,000 mg total) by mouth every 8 (eight) hours as needed for mild pain, moderate pain or headache.    . albuterol (PROVENTIL HFA;VENTOLIN HFA) 108 (90 Base) MCG/ACT inhaler Inhale 2 puffs into the lungs every 6 (six) hours as needed for wheezing or shortness of breath. 1 Inhaler 0  . aspirin 81 MG chewable tablet Chew 1 tablet (81 mg total) by mouth daily. (Patient taking differently: Chew 81 mg by mouth daily as needed for mild pain or moderate pain. ) 30 tablet 0  . atorvastatin (LIPITOR) 80 MG tablet Take 1 tablet (80 mg total) by mouth daily at 6 PM. 30 tablet 0  . azithromycin (ZITHROMAX) 250 MG tablet Take 2 tablets daily  for 3 days 6 each 0  . AZOR 10-40 MG per tablet Take 1 tablet by mouth daily. Reported on 12/04/2015    . Calcium Citrate (CITRACAL PO) Take 1 tablet by mouth daily.     . Cholecalciferol (VITAMIN D PO) Take 1 tablet by mouth daily.     . clopidogrel (PLAVIX) 75 MG tablet Take 1 tablet (75 mg total) by mouth daily. 30 tablet 0  . furosemide (LASIX) 20 MG tablet Take 1 tablet (20 mg total) by mouth daily. 30 tablet 1  . HYDROcodone-acetaminophen (NORCO) 10-325 MG tablet Take 1 tablet by mouth 3 (three) times daily.     Marland Kitchen loperamide (IMODIUM) 2 MG capsule Take 4 mg by mouth as needed for diarrhea or loose stools.    . metoprolol tartrate  (LOPRESSOR) 100 MG tablet Take 1 tablet (100 mg total) by mouth 2 (two) times daily. 60 tablet 0  . ofloxacin (OCUFLOX) 0.3 % ophthalmic solution Place 2 drops into the right ear daily as needed (for pain). 2 drops in right ear daily prn.    . omeprazole (PRILOSEC) 20 MG capsule Take 20 mg by mouth daily.    Marland Kitchen PARoxetine Mesylate 7.5 MG CAPS Take 7.5 mg by mouth at bedtime.    . potassium chloride (K-DUR,KLOR-CON) 10 MEQ tablet Take 1 tablet (10 mEq total) by mouth daily. 30 tablet 0  . umeclidinium-vilanterol (ANORO ELLIPTA) 62.5-25 MCG/INH AEPB Inhale 1 puff into the lungs daily.    Marland Kitchen venlafaxine XR (EFFEXOR-XR) 75 MG 24 hr capsule Take 150 mg by mouth daily with breakfast.      Social History   Socioeconomic History  . Marital status: Widowed    Spouse name: Not on file  . Number of children: Not on file  . Years of education: Not on file  . Highest education level: Not on file  Occupational History  . Not on file  Tobacco Use  . Smoking status: Former Smoker    Packs/day: 1.00    Years: 50.00    Pack years: 50.00    Types: Cigarettes  . Smokeless tobacco: Never Used  Vaping Use  . Vaping Use: Never used  Substance and Sexual Activity  . Alcohol use: No    Alcohol/week: 0.0 standard drinks  . Drug use: No  . Sexual activity: Not Currently    Birth control/protection: Post-menopausal, Surgical    Comment: hyst  Other Topics Concern  . Not on file  Social History Narrative  . Not on file   Social Determinants of Health   Financial Resource Strain:   . Difficulty of Paying Living Expenses: Not on file  Food Insecurity:   . Worried About Charity fundraiser in the Last Year: Not on file  . Ran Out of Food in the Last Year: Not on file  Transportation Needs:   . Lack of Transportation (Medical): Not on file  . Lack of Transportation (Non-Medical): Not on file  Physical Activity:   . Days of Exercise per Week: Not on file  . Minutes of Exercise per Session: Not on file   Stress:   . Feeling of Stress : Not on file  Social Connections:   . Frequency of Communication with Friends and Family: Not on file  . Frequency of Social Gatherings with Friends and Family: Not on file  . Attends Religious Services: Not on file  . Active Member of Clubs or Organizations: Not on file  . Attends Archivist Meetings: Not on  file  . Marital Status: Not on file  Intimate Partner Violence:   . Fear of Current or Ex-Partner: Not on file  . Emotionally Abused: Not on file  . Physically Abused: Not on file  . Sexually Abused: Not on file   Family History  Problem Relation Age of Onset  . Heart disease Mother   . Cancer Father        liver  . Cancer Son   . Heart disease Son   . Cancer Maternal Grandmother   . Colon cancer Neg Hx     OBJECTIVE:  Vitals:   03/05/20 1535  BP: (!) 159/90  Pulse: 92  Resp: 16  Temp: 98.5 F (36.9 C)  TempSrc: Oral  SpO2: 95%  Weight: 117 lb (53.1 kg)     General appearance: alert; appears fatigued, but nontoxic; speaking in full sentences and tolerating own secretions HEENT: NCAT; Ears: EACs clear, TMs pearly gray; Eyes: PERRL.  EOM grossly intact. Sinuses: nontender; Nose: nares patent without rhinorrhea, Throat: oropharynx clear, tonsils non erythematous or enlarged, uvula midline  Neck: supple without LAD Lungs: unlabored respirations, symmetrical air entry; cough: mild; no respiratory distress; CTAB Heart: regular rate and rhythm.  Radial pulses 2+ symmetrical bilaterally Skin: warm and dry Psychological: alert and cooperative; normal mood and affect  LABS:  No results found for this or any previous visit (from the past 24 hour(s)).   ASSESSMENT & PLAN:  1. Close exposure to COVID-19 virus   2. Viral URI   3. Weakness   4. Diarrhea, unspecified type   5. Encounter for screening for COVID-19     Meds ordered this encounter  Medications  . benzonatate (TESSALON) 100 MG capsule    Sig: Take 1 capsule  (100 mg total) by mouth every 8 (eight) hours.    Dispense:  30 capsule    Refill:  0  . fluticasone (FLONASE) 50 MCG/ACT nasal spray    Sig: Place 1 spray into both nostrils daily for 14 days.    Dispense:  16 g    Refill:  0    Discharge instructions  COVID testing ordered.  It will take between 2-7 days for test results.  Someone will contact you regarding abnormal results.    In the meantime: You should remain isolated in your home for 10 days from symptom onset AND greater than 24 hours after symptoms resolution (absence of fever without the use of fever-reducing medication and improvement in respiratory symptoms), whichever is longer Get plenty of rest and push fluids Tessalon Perles prescribed for cough Flonase was prescribed for nasal congestion Increase oral intake to replace fluid loss through diarrhea Use medications daily for symptom relief Use OTC medications like ibuprofen or tylenol as needed fever or pain Call or go to the ED if you have any new or worsening symptoms such as fever, worsening cough, shortness of breath, chest tightness, chest pain, turning blue, changes in mental status, etc...   Reviewed expectations re: course of current medical issues. Questions answered. Outlined signs and symptoms indicating need for more acute intervention. Patient verbalized understanding. After Visit Summary given.      Note: This document was prepared using Dragon voice recognition software and may include unintentional dictation errors.    Emerson Monte, FNP 03/05/20 1616

## 2020-03-06 LAB — SARS-COV-2, NAA 2 DAY TAT

## 2020-03-06 LAB — NOVEL CORONAVIRUS, NAA: SARS-CoV-2, NAA: NOT DETECTED

## 2020-03-27 DIAGNOSIS — F5104 Psychophysiologic insomnia: Secondary | ICD-10-CM | POA: Diagnosis not present

## 2020-03-27 DIAGNOSIS — F0391 Unspecified dementia with behavioral disturbance: Secondary | ICD-10-CM | POA: Diagnosis not present

## 2020-03-27 DIAGNOSIS — H9319 Tinnitus, unspecified ear: Secondary | ICD-10-CM | POA: Diagnosis not present

## 2020-03-27 DIAGNOSIS — E7849 Other hyperlipidemia: Secondary | ICD-10-CM | POA: Diagnosis not present

## 2020-03-27 DIAGNOSIS — I10 Essential (primary) hypertension: Secondary | ICD-10-CM | POA: Diagnosis not present

## 2020-03-27 DIAGNOSIS — M150 Primary generalized (osteo)arthritis: Secondary | ICD-10-CM | POA: Diagnosis not present

## 2020-03-27 DIAGNOSIS — R419 Unspecified symptoms and signs involving cognitive functions and awareness: Secondary | ICD-10-CM | POA: Diagnosis not present

## 2020-03-27 DIAGNOSIS — R441 Visual hallucinations: Secondary | ICD-10-CM | POA: Diagnosis not present

## 2020-04-01 DIAGNOSIS — R634 Abnormal weight loss: Secondary | ICD-10-CM | POA: Diagnosis not present

## 2020-04-01 DIAGNOSIS — R197 Diarrhea, unspecified: Secondary | ICD-10-CM | POA: Diagnosis not present

## 2020-04-01 DIAGNOSIS — M549 Dorsalgia, unspecified: Secondary | ICD-10-CM | POA: Diagnosis not present

## 2020-04-01 DIAGNOSIS — M5136 Other intervertebral disc degeneration, lumbar region: Secondary | ICD-10-CM | POA: Diagnosis not present

## 2020-04-11 DIAGNOSIS — R944 Abnormal results of kidney function studies: Secondary | ICD-10-CM | POA: Diagnosis not present

## 2020-04-11 DIAGNOSIS — M150 Primary generalized (osteo)arthritis: Secondary | ICD-10-CM | POA: Diagnosis not present

## 2020-04-11 DIAGNOSIS — L292 Pruritus vulvae: Secondary | ICD-10-CM | POA: Diagnosis not present

## 2020-04-11 DIAGNOSIS — F064 Anxiety disorder due to known physiological condition: Secondary | ICD-10-CM | POA: Diagnosis not present

## 2020-04-11 DIAGNOSIS — F17211 Nicotine dependence, cigarettes, in remission: Secondary | ICD-10-CM | POA: Diagnosis not present

## 2020-04-11 DIAGNOSIS — H9319 Tinnitus, unspecified ear: Secondary | ICD-10-CM | POA: Diagnosis not present

## 2020-04-11 DIAGNOSIS — D649 Anemia, unspecified: Secondary | ICD-10-CM | POA: Diagnosis not present

## 2020-04-11 DIAGNOSIS — G509 Disorder of trigeminal nerve, unspecified: Secondary | ICD-10-CM | POA: Diagnosis not present

## 2020-04-11 DIAGNOSIS — I10 Essential (primary) hypertension: Secondary | ICD-10-CM | POA: Diagnosis not present

## 2020-04-11 DIAGNOSIS — E782 Mixed hyperlipidemia: Secondary | ICD-10-CM | POA: Diagnosis not present

## 2020-04-11 DIAGNOSIS — E663 Overweight: Secondary | ICD-10-CM | POA: Diagnosis not present

## 2020-04-11 DIAGNOSIS — R945 Abnormal results of liver function studies: Secondary | ICD-10-CM | POA: Diagnosis not present

## 2020-04-18 ENCOUNTER — Other Ambulatory Visit: Payer: Self-pay

## 2020-04-18 ENCOUNTER — Encounter (HOSPITAL_COMMUNITY): Payer: Self-pay | Admitting: Emergency Medicine

## 2020-04-18 ENCOUNTER — Emergency Department (HOSPITAL_COMMUNITY)
Admission: EM | Admit: 2020-04-18 | Discharge: 2020-04-18 | Disposition: A | Discharge status: Home / Self Care | Payer: Medicare HMO | Attending: Emergency Medicine | Admitting: Emergency Medicine

## 2020-04-18 ENCOUNTER — Emergency Department (HOSPITAL_COMMUNITY): Payer: Medicare HMO

## 2020-04-18 DIAGNOSIS — R609 Edema, unspecified: Secondary | ICD-10-CM

## 2020-04-18 DIAGNOSIS — K5730 Diverticulosis of large intestine without perforation or abscess without bleeding: Secondary | ICD-10-CM | POA: Insufficient documentation

## 2020-04-18 DIAGNOSIS — M6281 Muscle weakness (generalized): Secondary | ICD-10-CM | POA: Insufficient documentation

## 2020-04-18 DIAGNOSIS — Z87891 Personal history of nicotine dependence: Secondary | ICD-10-CM | POA: Insufficient documentation

## 2020-04-18 DIAGNOSIS — I2510 Atherosclerotic heart disease of native coronary artery without angina pectoris: Secondary | ICD-10-CM | POA: Diagnosis not present

## 2020-04-18 DIAGNOSIS — A084 Viral intestinal infection, unspecified: Secondary | ICD-10-CM | POA: Diagnosis not present

## 2020-04-18 DIAGNOSIS — I119 Hypertensive heart disease without heart failure: Secondary | ICD-10-CM | POA: Diagnosis not present

## 2020-04-18 DIAGNOSIS — R531 Weakness: Secondary | ICD-10-CM | POA: Diagnosis not present

## 2020-04-18 DIAGNOSIS — R109 Unspecified abdominal pain: Secondary | ICD-10-CM | POA: Diagnosis not present

## 2020-04-18 DIAGNOSIS — R7989 Other specified abnormal findings of blood chemistry: Secondary | ICD-10-CM

## 2020-04-18 DIAGNOSIS — Z79899 Other long term (current) drug therapy: Secondary | ICD-10-CM | POA: Diagnosis not present

## 2020-04-18 DIAGNOSIS — A09 Infectious gastroenteritis and colitis, unspecified: Secondary | ICD-10-CM | POA: Diagnosis not present

## 2020-04-18 DIAGNOSIS — E876 Hypokalemia: Secondary | ICD-10-CM | POA: Diagnosis not present

## 2020-04-18 DIAGNOSIS — N200 Calculus of kidney: Secondary | ICD-10-CM | POA: Insufficient documentation

## 2020-04-18 DIAGNOSIS — R600 Localized edema: Secondary | ICD-10-CM | POA: Diagnosis not present

## 2020-04-18 DIAGNOSIS — I700 Atherosclerosis of aorta: Secondary | ICD-10-CM | POA: Diagnosis not present

## 2020-04-18 DIAGNOSIS — Z20822 Contact with and (suspected) exposure to covid-19: Secondary | ICD-10-CM | POA: Diagnosis not present

## 2020-04-18 DIAGNOSIS — R079 Chest pain, unspecified: Secondary | ICD-10-CM | POA: Diagnosis not present

## 2020-04-18 DIAGNOSIS — R945 Abnormal results of liver function studies: Secondary | ICD-10-CM | POA: Diagnosis not present

## 2020-04-18 DIAGNOSIS — R0602 Shortness of breath: Secondary | ICD-10-CM | POA: Diagnosis not present

## 2020-04-18 LAB — COMPREHENSIVE METABOLIC PANEL
ALT: 15 U/L (ref 0–44)
AST: 21 U/L (ref 15–41)
Albumin: 3.8 g/dL (ref 3.5–5.0)
Alkaline Phosphatase: 150 U/L — ABNORMAL HIGH (ref 38–126)
Anion gap: 8 (ref 5–15)
BUN: 15 mg/dL (ref 8–23)
CO2: 26 mmol/L (ref 22–32)
Calcium: 8.9 mg/dL (ref 8.9–10.3)
Chloride: 101 mmol/L (ref 98–111)
Creatinine, Ser: 0.94 mg/dL (ref 0.44–1.00)
GFR, Estimated: >60 mL/min (ref >60)
Glucose, Bld: 99 mg/dL (ref 70–99)
Potassium: 3.4 mmol/L — ABNORMAL LOW (ref 3.5–5.1)
Sodium: 135 mmol/L (ref 135–145)
Total Bilirubin: 0.8 mg/dL (ref 0.3–1.2)
Total Protein: 6.8 g/dL (ref 6.5–8.1)

## 2020-04-18 LAB — URINALYSIS, ROUTINE W REFLEX MICROSCOPIC
Bacteria, UA: NONE SEEN
Bilirubin Urine: NEGATIVE
Glucose, UA: NEGATIVE mg/dL
Hgb urine dipstick: NEGATIVE
Ketones, ur: NEGATIVE mg/dL
Leukocytes,Ua: NEGATIVE
Nitrite: NEGATIVE
Protein, ur: 100 mg/dL — AB
Specific Gravity, Urine: 1.02 (ref 1.005–1.030)
pH: 6 (ref 5.0–8.0)

## 2020-04-18 LAB — CBC WITH DIFFERENTIAL/PLATELET
Abs Immature Granulocytes: 0.02 10*3/uL (ref 0.00–0.07)
Basophils Absolute: 0 10*3/uL (ref 0.0–0.1)
Basophils Relative: 1 %
Eosinophils Absolute: 0.1 10*3/uL (ref 0.0–0.5)
Eosinophils Relative: 1 %
HCT: 37.7 % (ref 36.0–46.0)
Hemoglobin: 12 g/dL (ref 12.0–15.0)
Immature Granulocytes: 0 %
Lymphocytes Relative: 36 %
Lymphs Abs: 2.5 10*3/uL (ref 0.7–4.0)
MCH: 32.2 pg (ref 26.0–34.0)
MCHC: 31.8 g/dL (ref 30.0–36.0)
MCV: 101.1 fL — ABNORMAL HIGH (ref 80.0–100.0)
Monocytes Absolute: 0.5 10*3/uL (ref 0.1–1.0)
Monocytes Relative: 8 %
Neutro Abs: 3.7 10*3/uL (ref 1.7–7.7)
Neutrophils Relative %: 54 %
Platelets: 242 10*3/uL (ref 150–400)
RBC: 3.73 MIL/uL — ABNORMAL LOW (ref 3.87–5.11)
RDW: 13.2 % (ref 11.5–15.5)
WBC: 6.9 10*3/uL (ref 4.0–10.5)
nRBC: 0 % (ref 0.0–0.2)

## 2020-04-18 LAB — RESPIRATORY PANEL BY RT PCR (FLU A&B, COVID)
Influenza A by PCR: NEGATIVE
Influenza B by PCR: NEGATIVE
SARS Coronavirus 2 by RT PCR: NEGATIVE

## 2020-04-18 LAB — TROPONIN I (HIGH SENSITIVITY)
Troponin I (High Sensitivity): 10 ng/L (ref <18)
Troponin I (High Sensitivity): 10 ng/L (ref <18)

## 2020-04-18 LAB — LIPASE, BLOOD: Lipase: 25 U/L (ref 11–51)

## 2020-04-18 LAB — BRAIN NATRIURETIC PEPTIDE: B Natriuretic Peptide: 196 pg/mL — ABNORMAL HIGH (ref 0.0–100.0)

## 2020-04-18 MED ORDER — POTASSIUM CHLORIDE CRYS ER 20 MEQ PO TBCR
40.0000 meq | EXTENDED_RELEASE_TABLET | Freq: Once | ORAL | Status: AC
  Administered 2020-04-18: 40 meq via ORAL
  Filled 2020-04-18: qty 2

## 2020-04-18 MED ORDER — IOHEXOL 300 MG/ML  SOLN
100.0000 mL | Freq: Once | INTRAMUSCULAR | Status: AC | PRN
  Administered 2020-04-18: 100 mL via INTRAVENOUS

## 2020-04-18 NOTE — Discharge Instructions (Addendum)
Your labwork and imaging were reassuring at this time. Your diarrhea is likely related to a viral GI bug. I would recommend drinking plenty of water to stay hydrated and to eat bland foods for now to help ease your stomach. Please increase the potassium in your diet over the next couple of days and have your potassium level rechecked in 1 week. We have provided you a dose of potassium in the ED. You can also buy OTC probiotics to help replenish the natural gut flora that has likely been depleted with the amount of diarrhea you have been having.   In terms of your leg swelling I would also recommend elevating your legs whenever you can and to buy compression stockings.  Follow up with your PCP regarding your ED visit today. One of your liver function tests was only slightly elevated today - they can keep an eye on this level and trend it for any increase. They may also have you follow up with a GI specialist for same.   Return to the ED for any worsening symptoms including worsening abdominal pain, bloody diarrhea, fevers > 100.4, or any other new/concerning symptoms.

## 2020-04-18 NOTE — ED Provider Notes (Signed)
[facility] EMERGENCY DEPARTMENT Provider Note   CSN: [id number] Arrival date & time: 04/18/20  1811     History Chief Complaint  Patient presents with  . Weakness    Connie Wood is a 79 y.o. female with PMHx HTN, hypercholesterolemia, chronic back pain, CAD, IBS who presents to the ED today with complaint of generalized weakness for the past week. Daughter also complains of bilateral lower extremity swelling, loss of appetite, and new watery diarrhea that started the past few days. She mentions receiving a call from pt's PCP today regarding lab work - it was noted that pt's LFTs were elevated and that they wanted to get a CT scan to assess her liver. Daughter mentioned the other symptoms to PCP and they were advised to come to the ED for further evaluation. Daughter states that pt's leg swelling has actually decreased and looks improved today. Pt states she is having some mild diffuse abdominal pain as well. When asked if she's been having chest pain or shortness of breath  In regards to her leg swelling she also states " a little bit" however this is news to pt's daughter. Daughter denies any recent sick contacts. Pt is vaccinated against COVID 19 with 2 doses however has not received a booster dose. PSHx includes cholycestectomy per daughter.   The history is provided by the patient and medical records.       Past Medical History:  Diagnosis Date  . Back pain   . Cat scratch of right forearm 02/03/2014  . Dyspnea   . Hard of hearing   . Hemorrhoid 04/06/2014  . History of kidney stones   . Hx of degenerative disc disease   . Hypercholesterolemia   . Hypertension   . Migraines   . Osteoarthritis   . Stroke [facility])    mini stroke, resolved in 30 minutes    Patient Active Problem List   Diagnosis Date Noted  . CAD (coronary artery disease) 05/03/2018  . Elevated troponin 05/03/2018  . Anemia 05/03/2018  . NSTEMI (non-ST elevated myocardial infarction) ([place])  04/23/2018  . AKI (acute kidney injury) ([place]) 04/23/2018  . Obesity (BMI 30-39.9) 04/23/2018  . Acute on chronic respiratory failure with hypoxia ([place]) 04/23/2018  . Hypotension 04/23/2018  . Abnormal cardiac enzyme level   . IBS (irritable bowel syndrome) 01/06/2017  . Constipation 07/17/2016  . History of colonic polyps   . Common bile duct dilation 08/24/2014  . H/O acute pancreatitis 07/11/2014  . Hemorrhoid 04/06/2014  . Hypertension 02/03/2014  . Hypercholesteremia 02/03/2014  . Dyspnea 07/12/2013  . Chronic bronchitis ([place]) 07/11/2013  . Acute bronchitis 07/11/2013  . Nonhealing nonsurgical wound 04/05/2013  . Stasis edema 04/05/2013    Past Surgical History:  Procedure Laterality Date  . ABDOMINAL HYSTERECTOMY    . ANTERIOR AND POSTERIOR REPAIR    . APPENDECTOMY    . BILATERAL SALPINGOOPHORECTOMY    . BREAST SURGERY    . CARDIAC CATHETERIZATION    . CHOLECYSTECTOMY    . colonoscopy  2003   Dr. Gala Romney: normal rectum, scattered diverticula  . COLONOSCOPY WITH PROPOFOL N/A 01/01/2016   Procedure: COLONOSCOPY WITH PROPOFOL;  Surgeon: Daneil Dolin, MD;  Location: [facility] END[facility];  Service: Endoscopy;  Laterality: N/A;  1015  . ERCP with sphincterotomy  2003   Dr. Gala Romney: balloon dredging of bile duct, normal ampulla, choledocholithiasis   . EUS  2016   Dr. Amalia Hailey: subtle 13X17 mm heterogenous lesion in pancreas neck, FNA negative for  malignancy   . LEFT HEART CATH AND CORONARY ANGIOGRAPHY N/A 04/23/2018   Procedure: LEFT HEART CATH AND CORONARY ANGIOGRAPHY;  Surgeon: Belva Crome, MD;  Location: [place] CV LAB;  Service: Cardiovascular;  Laterality: N/A;  . POLYPECTOMY  01/01/2016   Procedure: POLYPECTOMY;  Surgeon: Daneil Dolin, MD;  Location: [facility] ENDO SU[facility];  Service: Endoscopy;;   polypectomies x2-splenic flexure and cecal  . TONSILLECTOMY       OB History    Gravida  5   Para  3   Term      Preterm      AB  2   Living  3     SAB  2   TAB       Ectopic      Multiple      Live Births              Family History  Problem Relation Age of Onset  . Heart disease Mother   . Cancer Father        liver  . Cancer Son   . Heart disease Son   . Cancer Maternal Grandmother   . Colon cancer Neg Hx     Social History   Tobacco Use  . Smoking status: Former Smoker    Packs/day: 1.00    Years: 50.00    Pack years: 50.00    Types: Cigarettes  . Smokeless tobacco: Never Used  Vaping Use  . Vaping Use: Never used  Substance Use Topics  . Alcohol use: No    Alcohol/week: 0.0 standard drinks  . Drug use: No    Home Medications Prior to Admission medications   Medication Sig Start Date End Date Taking? Authorizing Provider  atorvastatin (LIPITOR) 80 MG tablet Take 1 tablet (80 mg total) by mouth daily at 6 PM. 04/25/18  Yes Hongalgi, Lenis Dickinson, MD  AZOR 10-40 MG per tablet Take 1 tablet by mouth daily. Reported on 12/04/2015 07/06/13  Yes [provider]  Calcium Citrate (CITRACAL PO) Take 1 tablet by mouth daily.    Yes [provider]  Cholecalciferol (VITAMIN D PO) Take 1 tablet by mouth daily.    Yes [provider]  fluconazole (DIFLUCAN) 150 MG tablet fluconazole 150 mg tablet  TK 1 T PO TODAY THEN TK 1 T PO 3 DAYS LATER   Yes [provider]  fluticasone (FLONASE) 50 MCG/ACT nasal spray Place 1 spray into both nostrils daily for 14 days. 03/05/20 04/18/20 Yes Avegno, Darrelyn Hillock, FNP  hydrOXYzine (VISTARIL) 50 MG capsule Take 1 capsule by mouth daily.   Yes [provider]  loperamide (IMODIUM) 2 MG capsule Take 4 mg by mouth as needed for diarrhea or loose stools.   Yes [provider]  LORazepam (ATIVAN) 1 MG tablet Take 1 tablet by mouth at bedtime as needed. Take a 1/2 to 1 tablet by mouth at bedtime.   Yes [provider]  metoprolol tartrate (LOPRESSOR) 100 MG tablet Take 1 tablet (100 mg total) by mouth 2 (two) times daily. 04/25/18  Yes Hongalgi, Lenis Dickinson, MD   ofloxacin (OCUFLOX) 0.3 % ophthalmic solution Place 2 drops into the right ear daily as needed (for pain). 2 drops in right ear daily prn. 01/14/14  Yes [provider]  oxyCODONE-acetaminophen (PERCOCET) 10-325 MG tablet Take 1 tablet by mouth every 6 (six) hours as needed. 04/01/20  Yes [provider]  potassium chloride (K-DUR,KLOR-CON) 10 MEQ tablet Take 1 tablet (10  mEq total) by mouth daily. 05/06/18  Yes Georgette Shell, MD  acetaminophen (TYLENOL) 500 MG tablet Take 2 tablets (1,000 mg total) by mouth every 8 (eight) hours as needed for mild pain, moderate pain or headache. Patient not taking: Reported on 04/18/2020 04/25/18   Modena Jansky, MD  albuterol (PROVENTIL HFA;VENTOLIN HFA) 108 (90 Base) MCG/ACT inhaler Inhale 2 puffs into the lungs every 6 (six) hours as needed for wheezing or shortness of breath. Patient not taking: Reported on 04/18/2020 04/25/18   Modena Jansky, MD  aspirin 81 MG chewable tablet Chew 1 tablet (81 mg total) by mouth daily. Patient not taking: Reported on 04/18/2020 04/25/18   Modena Jansky, MD  azithromycin [facility]) 250 MG tablet Take 2 tablets daily for 3 days Patient not taking: Reported on 04/18/2020 05/06/18   Georgette Shell, MD  benzonatate (TESSALON) 100 MG capsule Take 1 capsule (100 mg total) by mouth every 8 (eight) hours. Patient not taking: Reported on 04/18/2020 03/05/20   Emerson Monte, FNP  clopidogrel (PLAVIX) 75 MG tablet Take 1 tablet (75 mg total) by mouth daily. Patient not taking: Reported on 04/18/2020 04/26/18   Modena Jansky, MD  furosemide (LASIX) 20 MG tablet Take 1 tablet (20 mg total) by mouth daily. Patient not taking: Reported on 04/18/2020 05/06/18   Georgette Shell, MD  umeclidinium-vilanterol [facility] ELLIPTA) 62.5-25 MCG/INH AEPB Inhale 1 puff into the lungs daily. Patient not taking: Reported on 04/18/2020 04/25/18   Modena Jansky, MD    Allergies    Adhesive [tape],  Doxycycline, and Latex  Review of Systems   Review of Systems  Constitutional: Positive for activity change, appetite change and fatigue. Negative for chills and fever.  Respiratory: Positive for shortness of breath. Negative for cough.   Cardiovascular: Positive for chest pain and leg swelling (bilateral).  Gastrointestinal: Positive for abdominal pain and diarrhea. Negative for nausea and vomiting.  All other systems reviewed and are negative.   Physical Exam Updated Vital Signs BP (!) 154/89   Pulse 85   Temp 98.4 F (36.9 C) (Oral)   Resp 16   Ht 5' 2"  (1.575 m)   Wt 51.7 kg   SpO2 97%   BMI 20.85 kg/m   Physical Exam Vitals and nursing note reviewed.  Constitutional:      Appearance: She is not ill-appearing or diaphoretic.  HENT:     Head: Normocephalic and atraumatic.  Eyes:     Conjunctiva/sclera: Conjunctivae normal.  Cardiovascular:     Rate and Rhythm: Normal rate and regular rhythm.     Pulses: Normal pulses.  Pulmonary:     Effort: Pulmonary effort is normal.     Breath sounds: Normal breath sounds. No wheezing, rhonchi or rales.  Abdominal:     Palpations: Abdomen is soft.     Tenderness: There is abdominal tenderness. There is no guarding or rebound.     Comments: Mild diffuse abdominal TTP  Musculoskeletal:        General: No tenderness.     Cervical back: Neck supple.     Comments: Nonpitting edema bilaterally  Skin:    General: Skin is warm and dry.  Neurological:     Mental Status: She is alert.     ED Results / Procedures / Treatments   Labs (all labs ordered are listed, but only abnormal results are displayed) Labs Reviewed  COMPREHENSIVE METABOLIC PANEL - Abnormal; Notable for the following components:  Result Value   Potassium 3.4 (*)    Alkaline Phosphatase 150 (*)    All other components within normal limits  CBC WITH DIFFERENTIAL/PLATELET - Abnormal; Notable for the following components:   RBC 3.73 (*)    MCV 101.1 (*)     All other components within normal limits  URINALYSIS, ROUTINE W REFLEX MICROSCOPIC - Abnormal; Notable for the following components:   APPearance HAZY (*)    Protein, ur 100 (*)    All other components within normal limits  BRAIN NATRIURETIC PEPTIDE - Abnormal; Notable for the following components:   B Natriuretic Peptide 196.0 (*)    All other components within normal limits  RESPIRATORY PANEL BY RT PCR (FLU A&B, COVID)  LIPASE, BLOOD  TROPONIN I (HIGH SENSITIVITY)  TROPONIN I (HIGH SENSITIVITY)    EKG EKG Interpretation  Date/Time:  Tuesday April 18 2020 20:13:53 EDT Ventricular Rate:  72 PR Interval:    QRS Duration: 95 QT Interval:  428 QTC Calculation: 469 R Axis:   45 Text Interpretation: Sinus rhythm Probable left ventricular hypertrophy No significant change was found Confirmed by Gerlene Fee [phone]) on 04/18/2020 10:51:49 PM   Radiology CT Abdomen Pelvis W Contrast  Result Date: 04/18/2020 CLINICAL DATA:  79 year old female with abdominal pain. EXAM: CT ABDOMEN AND PELVIS WITH CONTRAST TECHNIQUE: Multidetector CT imaging of the abdomen and pelvis was performed using the standard protocol following bolus administration of intravenous contrast. CONTRAST:  168m OMNIPAQUE IOHEXOL 300 MG/ML  SOLN COMPARISON:  CT abdomen pelvis dated 02/26/2016. FINDINGS: Lower chest: The visualized lung bases are clear. No intra-abdominal free air or free fluid. Hepatobiliary: Coarse calcifications along the inferior capsule of the right lobe of the liver as seen previously, likely sequela prior infection or hemorrhage. Clinical correlation is recommended. Cholecystectomy. There is mild dilatation of the intrahepatic and extrahepatic biliary trees, slightly progressed since the prior CT. The common bile duct measures approximately 14 mm in diameter. No retained calcified stone identified in the central CBD. MRCP may provide better evaluation if there is concern for biliary obstruction.  Pancreas: Top-normal diameter main pancreatic duct. No gland atrophy. No active inflammatory changes. Spleen: Normal in size without focal abnormality. Adrenals/Urinary Tract: The left adrenal gland is unremarkable. Right adrenal thickening/hyperplasia or adenoma. There is a 7 mm nonobstructing stone in the upper pole of the right kidney. There is a punctate nonobstructing left renal upper pole calculus. There is bilateral renal cortical scarring and lobulation. There is no hydronephrosis on either side. There is symmetric enhancement and excretion of contrast by both kidneys. There is right perinephric stranding. Correlation with urinalysis recommended to evaluate for pyelonephritis. The urinary bladder is grossly unremarkable. Stomach/Bowel: Evaluation of the bowel is limited in the absence of oral contrast. The stomach is contracted. There is sigmoid diverticulosis. Mild diffuse thickening of the distal colon may be related to underdistention or represent mild colitis. Clinical correlation is recommended. There is no bowel obstruction. Appendectomy. Vascular/Lymphatic: Advanced aortoiliac atherosclerotic disease. The aorta is tortuous. The IVC is unremarkable. No portal venous gas. There is no adenopathy. There is mild mesenteric edema. Reproductive: Hysterectomy. Other: Mild diffuse subcutaneous edema. No fluid collection. Musculoskeletal: Osteopenia with scoliosis and severe degenerative changes of the spine. No acute osseous pathology. IMPRESSION: 1. Underdistention of the colon versus mild colitis. Clinical correlation is recommended. No bowel obstruction. 2. Sigmoid diverticulosis. 3. Nonobstructing bilateral renal calculi. No hydronephrosis. 4. There is right perinephric stranding. Correlation with urinalysis recommended to exclude UTI. 5. Mild dilatation of  the intrahepatic and extrahepatic biliary trees, slightly progressed since the prior CT. No retained calcified stone identified in the central CBD.  MRCP may provide better evaluation if there is concern for biliary obstruction. 6. Aortic Atherosclerosis (ICD10-I70.0). Electronically Signed   By: Anner Crete M.D.   On: 04/18/2020 22:32   DG Chest Port 1 View  Result Date: 04/18/2020 CLINICAL DATA:  Chest pain and shortness of breath. Generalized weakness. EXAM: PORTABLE CHEST 1 VIEW COMPARISON:  Radiograph 05/05/2018. FINDINGS: The heart is normal in size. Again seen aortic atherosclerosis and tortuosity. Mild interstitial coarsening and interstitial accentuation at the lung bases. No confluent consolidation or focal airspace disease. No pulmonary edema. No pleural fluid or pneumothorax. The bones are under mineralized. Mild scoliotic curvature of the spine. No acute osseous abnormalities are seen. IMPRESSION: 1. No acute chest findings. 2. Chronic interstitial coarsening and interstitial accentuation at the lung bases. Electronically Signed   By: Keith Rake M.D.   On: 04/18/2020 20:34    Procedures Procedures (including critical care time)  Medications Ordered in ED Medications  potassium chloride SA (KLOR-CON) CR tablet 40 mEq (has no administration in time range)  iohexol (OMNIPAQUE) 300 MG/ML solution 100 mL (100 mLs Intravenous Contrast Given 04/18/20 2212)    ED Course  I have reviewed the triage vital signs and the nursing notes.  Pertinent labs & imaging results that were available during my care of the patient were reviewed by me and considered in my medical decision making (see chart for details).    MDM Rules/Calculators/A&P                          79 year old female who presents to the ED today with complaint of generalized weakness for the past week with bilateral leg swelling as well as diarrhea for the past several days.  Daughter was advised by PCP to bring patient here for elevated LFTs that were done in an outpatient office last week for regular routine follow-up.  Patient does have past surgical history of  cholecystectomy.  She has been having some mild abdominal pain, on exam she has diffuse abdominal tenderness however again very mild without rebound or guarding.  Doubt acute abdomen at this time however given elevated LFTs with abdominal pain and diarrhea will plan to CT scan patient to assess for any retained stone versus other intra-abdominal etiology.  Patient does have some nonpitting edema bilaterally however not very extensive today.  Daughter reports that is the best it has looked all week.  When asked if patient is having any chest pain or shortness of breath she does report "a little bit" however she is hard of hearing and difficult to say if she actually heard me.  Last echo in 2019 with a preserved ejection fraction of 65%.  We will plan to work-up with her shortness of breath and bilateral leg swelling with BNP, chest x-ray as well as EKG, troponin.  Patient without any unilateral leg swelling to suggest DVT at this time.  No history of same.  We will plan to check for Covid given diarrhea however patient is vaccinated.  Attending physician Dr. Sedonia Small has evaluated patient as well and agrees with plan.  CBC without leukocytosis to suggest infection.  Hemoglobin stable at 12.0. CMP with a potassium of 3.4 and an alk phos of 150.  Unable to see records from PCP.  No other elevation in LFTs at this time. Lipase within normal limits at  25. Pending CT scan.  EKG today without acute ischemic changes. Troponin of 10. BNP 196.  Lab work from 1 year ago greater than 300.  Chest x-ray clear. COVID negative  CT scan IMPRESSION:  1. Underdistention of the colon versus mild colitis. Clinical  correlation is recommended. No bowel obstruction.  2. Sigmoid diverticulosis.  3. Nonobstructing bilateral renal calculi. No hydronephrosis.  4. There is right perinephric stranding. Correlation with urinalysis  recommended to exclude UTI.  5. Mild dilatation of the intrahepatic and extrahepatic biliary   trees, slightly progressed since the prior CT. No retained calcified  stone identified in the central CBD. MRCP may provide better  evaluation if there is concern for biliary obstruction.  6. Aortic Atherosclerosis (ICD10-I70.0).   Given patient's diarrhea has only been going on for a few days I suspect this is likely more viral than bacterial.  Do not feel she requires antibiotics at this time.  Her urinalysis does not indicate UTI today.  I have low suspicion for biliary obstruction given alk phos is only slightly elevated, lipase within normal limits and overall well-appearing patient however we will have her follow-up with her PCP regarding same.  Patient and daughter updated on all the findings and instructed for PCP follow-up.  Encouraged to drink plenty of fluids to stay hydrated and have discussed bland diet with patient.  She is also encouraged to elevate her legs any chance she gets and to invest in compression stockings for her swelling.  Strict return precautions have been discussed.  They are in agreement with plan at this time.   Repeat troponin is pending however I do not suspect it will be elevated. At shift change oncoming provider has taken over care and will discharge if repeat trop unchanged.   This note was prepared using Dragon voice recognition software and may include unintentional dictation errors due to the inherent limitations of voice recognition software.   Final Clinical Impression(s) / ED Diagnoses Final diagnoses:  Generalized weakness  Elevated LFTs  Viral gastroenteritis  Peripheral edema  Hypokalemia    Rx / DC Orders ED Discharge Orders    None       Eustaquio Maize, PA-C 04/18/20 2325    Maudie Flakes, MD 04/18/20 2329

## 2020-04-18 NOTE — ED Triage Notes (Addendum)
Pt arrives w/complaints of leg swelling, generalized weakness x1 week, pt states diarrhea tonight along with urinary frequency. pts daughter states Dr. Durene Cal office called stating her liver enzymes were elevated. Pt also states she has not had an appetite either. Minimal edema noted to lower extremities.

## 2020-04-25 ENCOUNTER — Other Ambulatory Visit (HOSPITAL_COMMUNITY): Payer: Self-pay | Admitting: Internal Medicine

## 2020-04-25 DIAGNOSIS — K56609 Unspecified intestinal obstruction, unspecified as to partial versus complete obstruction: Secondary | ICD-10-CM

## 2020-05-05 DIAGNOSIS — F5104 Psychophysiologic insomnia: Secondary | ICD-10-CM | POA: Diagnosis not present

## 2020-05-05 DIAGNOSIS — I10 Essential (primary) hypertension: Secondary | ICD-10-CM | POA: Diagnosis not present

## 2020-05-05 DIAGNOSIS — E7849 Other hyperlipidemia: Secondary | ICD-10-CM | POA: Diagnosis not present

## 2020-05-05 DIAGNOSIS — R419 Unspecified symptoms and signs involving cognitive functions and awareness: Secondary | ICD-10-CM | POA: Diagnosis not present

## 2020-05-05 DIAGNOSIS — H9319 Tinnitus, unspecified ear: Secondary | ICD-10-CM | POA: Diagnosis not present

## 2020-05-05 DIAGNOSIS — D649 Anemia, unspecified: Secondary | ICD-10-CM | POA: Diagnosis not present

## 2020-05-05 DIAGNOSIS — F0391 Unspecified dementia with behavioral disturbance: Secondary | ICD-10-CM | POA: Diagnosis not present

## 2020-05-05 DIAGNOSIS — M150 Primary generalized (osteo)arthritis: Secondary | ICD-10-CM | POA: Diagnosis not present

## 2020-05-05 DIAGNOSIS — R441 Visual hallucinations: Secondary | ICD-10-CM | POA: Diagnosis not present

## 2020-05-11 ENCOUNTER — Ambulatory Visit (HOSPITAL_COMMUNITY): Payer: Medicare HMO

## 2020-05-23 ENCOUNTER — Encounter (HOSPITAL_COMMUNITY): Payer: Self-pay

## 2020-05-23 ENCOUNTER — Ambulatory Visit (HOSPITAL_COMMUNITY): Payer: Medicare HMO

## 2020-05-25 DIAGNOSIS — R0981 Nasal congestion: Secondary | ICD-10-CM | POA: Diagnosis not present

## 2020-05-25 DIAGNOSIS — H9203 Otalgia, bilateral: Secondary | ICD-10-CM | POA: Diagnosis not present

## 2020-05-25 DIAGNOSIS — H9319 Tinnitus, unspecified ear: Secondary | ICD-10-CM | POA: Diagnosis not present

## 2020-05-25 DIAGNOSIS — G894 Chronic pain syndrome: Secondary | ICD-10-CM | POA: Diagnosis not present

## 2020-05-25 DIAGNOSIS — J302 Other seasonal allergic rhinitis: Secondary | ICD-10-CM | POA: Diagnosis not present

## 2020-05-25 DIAGNOSIS — M549 Dorsalgia, unspecified: Secondary | ICD-10-CM | POA: Diagnosis not present

## 2020-05-25 DIAGNOSIS — H903 Sensorineural hearing loss, bilateral: Secondary | ICD-10-CM | POA: Diagnosis not present

## 2020-06-08 DIAGNOSIS — F0391 Unspecified dementia with behavioral disturbance: Secondary | ICD-10-CM | POA: Diagnosis not present

## 2020-06-08 DIAGNOSIS — M545 Low back pain, unspecified: Secondary | ICD-10-CM | POA: Diagnosis not present

## 2020-06-08 DIAGNOSIS — F5104 Psychophysiologic insomnia: Secondary | ICD-10-CM | POA: Diagnosis not present

## 2020-06-08 DIAGNOSIS — R441 Visual hallucinations: Secondary | ICD-10-CM | POA: Diagnosis not present

## 2020-06-08 DIAGNOSIS — H9319 Tinnitus, unspecified ear: Secondary | ICD-10-CM | POA: Diagnosis not present

## 2020-06-08 DIAGNOSIS — R419 Unspecified symptoms and signs involving cognitive functions and awareness: Secondary | ICD-10-CM | POA: Diagnosis not present

## 2020-06-08 DIAGNOSIS — H9203 Otalgia, bilateral: Secondary | ICD-10-CM | POA: Diagnosis not present

## 2020-06-23 DIAGNOSIS — R419 Unspecified symptoms and signs involving cognitive functions and awareness: Secondary | ICD-10-CM | POA: Diagnosis not present

## 2020-06-23 DIAGNOSIS — F5104 Psychophysiologic insomnia: Secondary | ICD-10-CM | POA: Diagnosis not present

## 2020-06-23 DIAGNOSIS — R441 Visual hallucinations: Secondary | ICD-10-CM | POA: Diagnosis not present

## 2020-06-23 DIAGNOSIS — F0391 Unspecified dementia with behavioral disturbance: Secondary | ICD-10-CM | POA: Diagnosis not present

## 2020-06-23 DIAGNOSIS — H9319 Tinnitus, unspecified ear: Secondary | ICD-10-CM | POA: Diagnosis not present

## 2020-07-22 DIAGNOSIS — M545 Low back pain, unspecified: Secondary | ICD-10-CM | POA: Diagnosis not present

## 2020-07-22 DIAGNOSIS — H9319 Tinnitus, unspecified ear: Secondary | ICD-10-CM | POA: Diagnosis not present

## 2020-07-22 DIAGNOSIS — H9203 Otalgia, bilateral: Secondary | ICD-10-CM | POA: Diagnosis not present

## 2020-07-22 DIAGNOSIS — F0391 Unspecified dementia with behavioral disturbance: Secondary | ICD-10-CM | POA: Diagnosis not present

## 2020-07-22 DIAGNOSIS — F5104 Psychophysiologic insomnia: Secondary | ICD-10-CM | POA: Diagnosis not present

## 2020-07-22 DIAGNOSIS — E782 Mixed hyperlipidemia: Secondary | ICD-10-CM | POA: Diagnosis not present

## 2020-07-22 DIAGNOSIS — R419 Unspecified symptoms and signs involving cognitive functions and awareness: Secondary | ICD-10-CM | POA: Diagnosis not present

## 2020-07-22 DIAGNOSIS — R441 Visual hallucinations: Secondary | ICD-10-CM | POA: Diagnosis not present

## 2020-07-22 DIAGNOSIS — I10 Essential (primary) hypertension: Secondary | ICD-10-CM | POA: Diagnosis not present

## 2020-08-15 DIAGNOSIS — L292 Pruritus vulvae: Secondary | ICD-10-CM | POA: Diagnosis not present

## 2020-08-15 DIAGNOSIS — M150 Primary generalized (osteo)arthritis: Secondary | ICD-10-CM | POA: Diagnosis not present

## 2020-08-15 DIAGNOSIS — R945 Abnormal results of liver function studies: Secondary | ICD-10-CM | POA: Diagnosis not present

## 2020-08-15 DIAGNOSIS — G509 Disorder of trigeminal nerve, unspecified: Secondary | ICD-10-CM | POA: Diagnosis not present

## 2020-08-15 DIAGNOSIS — H9319 Tinnitus, unspecified ear: Secondary | ICD-10-CM | POA: Diagnosis not present

## 2020-08-15 DIAGNOSIS — D649 Anemia, unspecified: Secondary | ICD-10-CM | POA: Diagnosis not present

## 2020-08-15 DIAGNOSIS — F17211 Nicotine dependence, cigarettes, in remission: Secondary | ICD-10-CM | POA: Diagnosis not present

## 2020-08-15 DIAGNOSIS — F064 Anxiety disorder due to known physiological condition: Secondary | ICD-10-CM | POA: Diagnosis not present

## 2020-08-15 DIAGNOSIS — R944 Abnormal results of kidney function studies: Secondary | ICD-10-CM | POA: Diagnosis not present

## 2020-08-17 DIAGNOSIS — M542 Cervicalgia: Secondary | ICD-10-CM | POA: Diagnosis not present

## 2020-08-17 DIAGNOSIS — E782 Mixed hyperlipidemia: Secondary | ICD-10-CM | POA: Diagnosis not present

## 2020-08-17 DIAGNOSIS — I10 Essential (primary) hypertension: Secondary | ICD-10-CM | POA: Diagnosis not present

## 2020-08-17 DIAGNOSIS — R945 Abnormal results of liver function studies: Secondary | ICD-10-CM | POA: Diagnosis not present

## 2020-08-17 DIAGNOSIS — M1990 Unspecified osteoarthritis, unspecified site: Secondary | ICD-10-CM | POA: Diagnosis not present

## 2020-08-17 DIAGNOSIS — R7301 Impaired fasting glucose: Secondary | ICD-10-CM | POA: Diagnosis not present

## 2020-08-17 DIAGNOSIS — G509 Disorder of trigeminal nerve, unspecified: Secondary | ICD-10-CM | POA: Diagnosis not present

## 2020-08-17 DIAGNOSIS — R944 Abnormal results of kidney function studies: Secondary | ICD-10-CM | POA: Diagnosis not present

## 2020-08-17 DIAGNOSIS — J449 Chronic obstructive pulmonary disease, unspecified: Secondary | ICD-10-CM | POA: Diagnosis not present

## 2020-09-07 DIAGNOSIS — I10 Essential (primary) hypertension: Secondary | ICD-10-CM | POA: Diagnosis not present

## 2020-09-07 DIAGNOSIS — R011 Cardiac murmur, unspecified: Secondary | ICD-10-CM | POA: Diagnosis not present

## 2020-10-03 DIAGNOSIS — F064 Anxiety disorder due to known physiological condition: Secondary | ICD-10-CM | POA: Diagnosis not present

## 2020-10-03 DIAGNOSIS — F17211 Nicotine dependence, cigarettes, in remission: Secondary | ICD-10-CM | POA: Diagnosis not present

## 2020-10-03 DIAGNOSIS — R945 Abnormal results of liver function studies: Secondary | ICD-10-CM | POA: Diagnosis not present

## 2020-10-03 DIAGNOSIS — Z111 Encounter for screening for respiratory tuberculosis: Secondary | ICD-10-CM | POA: Diagnosis not present

## 2020-10-03 DIAGNOSIS — D649 Anemia, unspecified: Secondary | ICD-10-CM | POA: Diagnosis not present

## 2020-10-03 DIAGNOSIS — L292 Pruritus vulvae: Secondary | ICD-10-CM | POA: Diagnosis not present

## 2020-10-03 DIAGNOSIS — G509 Disorder of trigeminal nerve, unspecified: Secondary | ICD-10-CM | POA: Diagnosis not present

## 2020-10-03 DIAGNOSIS — M150 Primary generalized (osteo)arthritis: Secondary | ICD-10-CM | POA: Diagnosis not present

## 2020-10-03 DIAGNOSIS — Z022 Encounter for examination for admission to residential institution: Secondary | ICD-10-CM | POA: Diagnosis not present

## 2020-10-03 DIAGNOSIS — R944 Abnormal results of kidney function studies: Secondary | ICD-10-CM | POA: Diagnosis not present

## 2020-10-03 DIAGNOSIS — H9319 Tinnitus, unspecified ear: Secondary | ICD-10-CM | POA: Diagnosis not present

## 2020-10-14 DIAGNOSIS — M160 Bilateral primary osteoarthritis of hip: Secondary | ICD-10-CM | POA: Diagnosis not present

## 2020-10-14 DIAGNOSIS — I10 Essential (primary) hypertension: Secondary | ICD-10-CM | POA: Diagnosis not present

## 2020-10-14 DIAGNOSIS — J449 Chronic obstructive pulmonary disease, unspecified: Secondary | ICD-10-CM | POA: Diagnosis not present

## 2020-10-14 DIAGNOSIS — M419 Scoliosis, unspecified: Secondary | ICD-10-CM | POA: Diagnosis not present

## 2020-10-14 DIAGNOSIS — Z111 Encounter for screening for respiratory tuberculosis: Secondary | ICD-10-CM | POA: Diagnosis not present

## 2020-10-14 DIAGNOSIS — M479 Spondylosis, unspecified: Secondary | ICD-10-CM | POA: Diagnosis not present

## 2020-10-14 DIAGNOSIS — M810 Age-related osteoporosis without current pathological fracture: Secondary | ICD-10-CM | POA: Diagnosis not present

## 2020-10-14 DIAGNOSIS — M545 Low back pain, unspecified: Secondary | ICD-10-CM | POA: Diagnosis not present

## 2020-10-14 DIAGNOSIS — G8929 Other chronic pain: Secondary | ICD-10-CM | POA: Diagnosis not present

## 2020-10-14 DIAGNOSIS — Z022 Encounter for examination for admission to residential institution: Secondary | ICD-10-CM | POA: Diagnosis not present

## 2020-10-14 DIAGNOSIS — M170 Bilateral primary osteoarthritis of knee: Secondary | ICD-10-CM | POA: Diagnosis not present

## 2020-10-18 DIAGNOSIS — M419 Scoliosis, unspecified: Secondary | ICD-10-CM | POA: Diagnosis not present

## 2020-10-18 DIAGNOSIS — M170 Bilateral primary osteoarthritis of knee: Secondary | ICD-10-CM | POA: Diagnosis not present

## 2020-10-18 DIAGNOSIS — G8929 Other chronic pain: Secondary | ICD-10-CM | POA: Diagnosis not present

## 2020-10-18 DIAGNOSIS — J449 Chronic obstructive pulmonary disease, unspecified: Secondary | ICD-10-CM | POA: Diagnosis not present

## 2020-10-18 DIAGNOSIS — M160 Bilateral primary osteoarthritis of hip: Secondary | ICD-10-CM | POA: Diagnosis not present

## 2020-10-18 DIAGNOSIS — I10 Essential (primary) hypertension: Secondary | ICD-10-CM | POA: Diagnosis not present

## 2020-10-18 DIAGNOSIS — M545 Low back pain, unspecified: Secondary | ICD-10-CM | POA: Diagnosis not present

## 2020-10-18 DIAGNOSIS — M479 Spondylosis, unspecified: Secondary | ICD-10-CM | POA: Diagnosis not present

## 2020-10-18 DIAGNOSIS — M810 Age-related osteoporosis without current pathological fracture: Secondary | ICD-10-CM | POA: Diagnosis not present

## 2020-10-20 DIAGNOSIS — M479 Spondylosis, unspecified: Secondary | ICD-10-CM | POA: Diagnosis not present

## 2020-10-20 DIAGNOSIS — M810 Age-related osteoporosis without current pathological fracture: Secondary | ICD-10-CM | POA: Diagnosis not present

## 2020-10-20 DIAGNOSIS — M160 Bilateral primary osteoarthritis of hip: Secondary | ICD-10-CM | POA: Diagnosis not present

## 2020-10-20 DIAGNOSIS — G8929 Other chronic pain: Secondary | ICD-10-CM | POA: Diagnosis not present

## 2020-10-20 DIAGNOSIS — M545 Low back pain, unspecified: Secondary | ICD-10-CM | POA: Diagnosis not present

## 2020-10-20 DIAGNOSIS — J449 Chronic obstructive pulmonary disease, unspecified: Secondary | ICD-10-CM | POA: Diagnosis not present

## 2020-10-20 DIAGNOSIS — M170 Bilateral primary osteoarthritis of knee: Secondary | ICD-10-CM | POA: Diagnosis not present

## 2020-10-20 DIAGNOSIS — M419 Scoliosis, unspecified: Secondary | ICD-10-CM | POA: Diagnosis not present

## 2020-10-20 DIAGNOSIS — I10 Essential (primary) hypertension: Secondary | ICD-10-CM | POA: Diagnosis not present

## 2020-10-23 DIAGNOSIS — M545 Low back pain, unspecified: Secondary | ICD-10-CM | POA: Diagnosis not present

## 2020-10-23 DIAGNOSIS — M170 Bilateral primary osteoarthritis of knee: Secondary | ICD-10-CM | POA: Diagnosis not present

## 2020-10-23 DIAGNOSIS — M810 Age-related osteoporosis without current pathological fracture: Secondary | ICD-10-CM | POA: Diagnosis not present

## 2020-10-23 DIAGNOSIS — M160 Bilateral primary osteoarthritis of hip: Secondary | ICD-10-CM | POA: Diagnosis not present

## 2020-10-23 DIAGNOSIS — M419 Scoliosis, unspecified: Secondary | ICD-10-CM | POA: Diagnosis not present

## 2020-10-23 DIAGNOSIS — J449 Chronic obstructive pulmonary disease, unspecified: Secondary | ICD-10-CM | POA: Diagnosis not present

## 2020-10-23 DIAGNOSIS — M479 Spondylosis, unspecified: Secondary | ICD-10-CM | POA: Diagnosis not present

## 2020-10-23 DIAGNOSIS — I10 Essential (primary) hypertension: Secondary | ICD-10-CM | POA: Diagnosis not present

## 2020-10-23 DIAGNOSIS — G8929 Other chronic pain: Secondary | ICD-10-CM | POA: Diagnosis not present

## 2020-10-26 DIAGNOSIS — M170 Bilateral primary osteoarthritis of knee: Secondary | ICD-10-CM | POA: Diagnosis not present

## 2020-10-26 DIAGNOSIS — G8929 Other chronic pain: Secondary | ICD-10-CM | POA: Diagnosis not present

## 2020-10-26 DIAGNOSIS — J449 Chronic obstructive pulmonary disease, unspecified: Secondary | ICD-10-CM | POA: Diagnosis not present

## 2020-10-26 DIAGNOSIS — M810 Age-related osteoporosis without current pathological fracture: Secondary | ICD-10-CM | POA: Diagnosis not present

## 2020-10-26 DIAGNOSIS — M545 Low back pain, unspecified: Secondary | ICD-10-CM | POA: Diagnosis not present

## 2020-10-26 DIAGNOSIS — M419 Scoliosis, unspecified: Secondary | ICD-10-CM | POA: Diagnosis not present

## 2020-10-26 DIAGNOSIS — M479 Spondylosis, unspecified: Secondary | ICD-10-CM | POA: Diagnosis not present

## 2020-10-26 DIAGNOSIS — I10 Essential (primary) hypertension: Secondary | ICD-10-CM | POA: Diagnosis not present

## 2020-10-26 DIAGNOSIS — M160 Bilateral primary osteoarthritis of hip: Secondary | ICD-10-CM | POA: Diagnosis not present

## 2020-10-31 DIAGNOSIS — M170 Bilateral primary osteoarthritis of knee: Secondary | ICD-10-CM | POA: Diagnosis not present

## 2020-10-31 DIAGNOSIS — M545 Low back pain, unspecified: Secondary | ICD-10-CM | POA: Diagnosis not present

## 2020-10-31 DIAGNOSIS — M419 Scoliosis, unspecified: Secondary | ICD-10-CM | POA: Diagnosis not present

## 2020-10-31 DIAGNOSIS — M479 Spondylosis, unspecified: Secondary | ICD-10-CM | POA: Diagnosis not present

## 2020-10-31 DIAGNOSIS — I10 Essential (primary) hypertension: Secondary | ICD-10-CM | POA: Diagnosis not present

## 2020-10-31 DIAGNOSIS — G8929 Other chronic pain: Secondary | ICD-10-CM | POA: Diagnosis not present

## 2020-10-31 DIAGNOSIS — M810 Age-related osteoporosis without current pathological fracture: Secondary | ICD-10-CM | POA: Diagnosis not present

## 2020-10-31 DIAGNOSIS — M160 Bilateral primary osteoarthritis of hip: Secondary | ICD-10-CM | POA: Diagnosis not present

## 2020-10-31 DIAGNOSIS — J449 Chronic obstructive pulmonary disease, unspecified: Secondary | ICD-10-CM | POA: Diagnosis not present

## 2020-11-06 DIAGNOSIS — J449 Chronic obstructive pulmonary disease, unspecified: Secondary | ICD-10-CM | POA: Diagnosis not present

## 2020-11-06 DIAGNOSIS — M545 Low back pain, unspecified: Secondary | ICD-10-CM | POA: Diagnosis not present

## 2020-11-06 DIAGNOSIS — G8929 Other chronic pain: Secondary | ICD-10-CM | POA: Diagnosis not present

## 2020-11-06 DIAGNOSIS — M419 Scoliosis, unspecified: Secondary | ICD-10-CM | POA: Diagnosis not present

## 2020-11-06 DIAGNOSIS — M810 Age-related osteoporosis without current pathological fracture: Secondary | ICD-10-CM | POA: Diagnosis not present

## 2020-11-06 DIAGNOSIS — M160 Bilateral primary osteoarthritis of hip: Secondary | ICD-10-CM | POA: Diagnosis not present

## 2020-11-06 DIAGNOSIS — M170 Bilateral primary osteoarthritis of knee: Secondary | ICD-10-CM | POA: Diagnosis not present

## 2020-11-06 DIAGNOSIS — M479 Spondylosis, unspecified: Secondary | ICD-10-CM | POA: Diagnosis not present

## 2020-11-06 DIAGNOSIS — I10 Essential (primary) hypertension: Secondary | ICD-10-CM | POA: Diagnosis not present

## 2020-11-07 DIAGNOSIS — G8929 Other chronic pain: Secondary | ICD-10-CM | POA: Diagnosis not present

## 2020-11-07 DIAGNOSIS — M545 Low back pain, unspecified: Secondary | ICD-10-CM | POA: Diagnosis not present

## 2020-11-07 DIAGNOSIS — M479 Spondylosis, unspecified: Secondary | ICD-10-CM | POA: Diagnosis not present

## 2020-11-07 DIAGNOSIS — M170 Bilateral primary osteoarthritis of knee: Secondary | ICD-10-CM | POA: Diagnosis not present

## 2020-11-07 DIAGNOSIS — M810 Age-related osteoporosis without current pathological fracture: Secondary | ICD-10-CM | POA: Diagnosis not present

## 2020-11-07 DIAGNOSIS — M419 Scoliosis, unspecified: Secondary | ICD-10-CM | POA: Diagnosis not present

## 2020-11-07 DIAGNOSIS — M160 Bilateral primary osteoarthritis of hip: Secondary | ICD-10-CM | POA: Diagnosis not present

## 2020-11-07 DIAGNOSIS — J449 Chronic obstructive pulmonary disease, unspecified: Secondary | ICD-10-CM | POA: Diagnosis not present

## 2020-11-07 DIAGNOSIS — I10 Essential (primary) hypertension: Secondary | ICD-10-CM | POA: Diagnosis not present

## 2020-11-13 ENCOUNTER — Other Ambulatory Visit (HOSPITAL_COMMUNITY)
Admission: RE | Admit: 2020-11-13 | Discharge: 2020-11-13 | Disposition: A | Discharge status: Home / Self Care | Payer: Medicare PPO | Source: Other Acute Inpatient Hospital | Attending: Internal Medicine | Admitting: Internal Medicine

## 2020-11-13 DIAGNOSIS — M419 Scoliosis, unspecified: Secondary | ICD-10-CM | POA: Diagnosis not present

## 2020-11-13 DIAGNOSIS — N390 Urinary tract infection, site not specified: Secondary | ICD-10-CM | POA: Insufficient documentation

## 2020-11-13 DIAGNOSIS — G8929 Other chronic pain: Secondary | ICD-10-CM | POA: Diagnosis not present

## 2020-11-13 DIAGNOSIS — M170 Bilateral primary osteoarthritis of knee: Secondary | ICD-10-CM | POA: Diagnosis not present

## 2020-11-13 DIAGNOSIS — M545 Low back pain, unspecified: Secondary | ICD-10-CM | POA: Diagnosis not present

## 2020-11-13 DIAGNOSIS — I10 Essential (primary) hypertension: Secondary | ICD-10-CM | POA: Diagnosis not present

## 2020-11-13 DIAGNOSIS — M810 Age-related osteoporosis without current pathological fracture: Secondary | ICD-10-CM | POA: Diagnosis not present

## 2020-11-13 DIAGNOSIS — M479 Spondylosis, unspecified: Secondary | ICD-10-CM | POA: Diagnosis not present

## 2020-11-13 DIAGNOSIS — J449 Chronic obstructive pulmonary disease, unspecified: Secondary | ICD-10-CM | POA: Diagnosis not present

## 2020-11-13 DIAGNOSIS — M160 Bilateral primary osteoarthritis of hip: Secondary | ICD-10-CM | POA: Diagnosis not present

## 2020-11-13 LAB — URINALYSIS, COMPLETE (UACMP) WITH MICROSCOPIC
Bacteria, UA: NONE SEEN
Bilirubin Urine: NEGATIVE
Glucose, UA: 50 mg/dL — AB
Hgb urine dipstick: NEGATIVE
Ketones, ur: NEGATIVE mg/dL
Nitrite: NEGATIVE
Protein, ur: 100 mg/dL — AB
Specific Gravity, Urine: 1.015 (ref 1.005–1.030)
pH: 6 (ref 5.0–8.0)

## 2020-11-14 DIAGNOSIS — G8929 Other chronic pain: Secondary | ICD-10-CM | POA: Diagnosis not present

## 2020-11-14 DIAGNOSIS — I10 Essential (primary) hypertension: Secondary | ICD-10-CM | POA: Diagnosis not present

## 2020-11-14 DIAGNOSIS — M810 Age-related osteoporosis without current pathological fracture: Secondary | ICD-10-CM | POA: Diagnosis not present

## 2020-11-14 DIAGNOSIS — M170 Bilateral primary osteoarthritis of knee: Secondary | ICD-10-CM | POA: Diagnosis not present

## 2020-11-14 DIAGNOSIS — M479 Spondylosis, unspecified: Secondary | ICD-10-CM | POA: Diagnosis not present

## 2020-11-14 DIAGNOSIS — J449 Chronic obstructive pulmonary disease, unspecified: Secondary | ICD-10-CM | POA: Diagnosis not present

## 2020-11-14 DIAGNOSIS — M545 Low back pain, unspecified: Secondary | ICD-10-CM | POA: Diagnosis not present

## 2020-11-14 DIAGNOSIS — M160 Bilateral primary osteoarthritis of hip: Secondary | ICD-10-CM | POA: Diagnosis not present

## 2020-11-14 DIAGNOSIS — M419 Scoliosis, unspecified: Secondary | ICD-10-CM | POA: Diagnosis not present

## 2020-11-15 LAB — URINE CULTURE

## 2020-11-21 DIAGNOSIS — M810 Age-related osteoporosis without current pathological fracture: Secondary | ICD-10-CM | POA: Diagnosis not present

## 2020-11-21 DIAGNOSIS — I10 Essential (primary) hypertension: Secondary | ICD-10-CM | POA: Diagnosis not present

## 2020-11-21 DIAGNOSIS — M545 Low back pain, unspecified: Secondary | ICD-10-CM | POA: Diagnosis not present

## 2020-11-21 DIAGNOSIS — J449 Chronic obstructive pulmonary disease, unspecified: Secondary | ICD-10-CM | POA: Diagnosis not present

## 2020-11-21 DIAGNOSIS — M419 Scoliosis, unspecified: Secondary | ICD-10-CM | POA: Diagnosis not present

## 2020-11-21 DIAGNOSIS — M479 Spondylosis, unspecified: Secondary | ICD-10-CM | POA: Diagnosis not present

## 2020-11-21 DIAGNOSIS — M170 Bilateral primary osteoarthritis of knee: Secondary | ICD-10-CM | POA: Diagnosis not present

## 2020-11-21 DIAGNOSIS — M160 Bilateral primary osteoarthritis of hip: Secondary | ICD-10-CM | POA: Diagnosis not present

## 2020-11-21 DIAGNOSIS — G8929 Other chronic pain: Secondary | ICD-10-CM | POA: Diagnosis not present

## 2020-11-27 DIAGNOSIS — I10 Essential (primary) hypertension: Secondary | ICD-10-CM | POA: Diagnosis not present

## 2020-11-27 DIAGNOSIS — M170 Bilateral primary osteoarthritis of knee: Secondary | ICD-10-CM | POA: Diagnosis not present

## 2020-11-27 DIAGNOSIS — M479 Spondylosis, unspecified: Secondary | ICD-10-CM | POA: Diagnosis not present

## 2020-11-27 DIAGNOSIS — G8929 Other chronic pain: Secondary | ICD-10-CM | POA: Diagnosis not present

## 2020-11-27 DIAGNOSIS — M545 Low back pain, unspecified: Secondary | ICD-10-CM | POA: Diagnosis not present

## 2020-11-27 DIAGNOSIS — M419 Scoliosis, unspecified: Secondary | ICD-10-CM | POA: Diagnosis not present

## 2020-11-27 DIAGNOSIS — M160 Bilateral primary osteoarthritis of hip: Secondary | ICD-10-CM | POA: Diagnosis not present

## 2020-11-27 DIAGNOSIS — J449 Chronic obstructive pulmonary disease, unspecified: Secondary | ICD-10-CM | POA: Diagnosis not present

## 2020-11-27 DIAGNOSIS — M810 Age-related osteoporosis without current pathological fracture: Secondary | ICD-10-CM | POA: Diagnosis not present

## 2020-11-28 DIAGNOSIS — G8929 Other chronic pain: Secondary | ICD-10-CM | POA: Diagnosis not present

## 2020-11-28 DIAGNOSIS — M545 Low back pain, unspecified: Secondary | ICD-10-CM | POA: Diagnosis not present

## 2020-11-28 DIAGNOSIS — I10 Essential (primary) hypertension: Secondary | ICD-10-CM | POA: Diagnosis not present

## 2020-11-28 DIAGNOSIS — M810 Age-related osteoporosis without current pathological fracture: Secondary | ICD-10-CM | POA: Diagnosis not present

## 2020-11-28 DIAGNOSIS — J449 Chronic obstructive pulmonary disease, unspecified: Secondary | ICD-10-CM | POA: Diagnosis not present

## 2020-11-28 DIAGNOSIS — M160 Bilateral primary osteoarthritis of hip: Secondary | ICD-10-CM | POA: Diagnosis not present

## 2020-11-28 DIAGNOSIS — M479 Spondylosis, unspecified: Secondary | ICD-10-CM | POA: Diagnosis not present

## 2020-11-28 DIAGNOSIS — M170 Bilateral primary osteoarthritis of knee: Secondary | ICD-10-CM | POA: Diagnosis not present

## 2020-11-28 DIAGNOSIS — M419 Scoliosis, unspecified: Secondary | ICD-10-CM | POA: Diagnosis not present

## 2020-12-04 DIAGNOSIS — M479 Spondylosis, unspecified: Secondary | ICD-10-CM | POA: Diagnosis not present

## 2020-12-04 DIAGNOSIS — I10 Essential (primary) hypertension: Secondary | ICD-10-CM | POA: Diagnosis not present

## 2020-12-04 DIAGNOSIS — M170 Bilateral primary osteoarthritis of knee: Secondary | ICD-10-CM | POA: Diagnosis not present

## 2020-12-04 DIAGNOSIS — M810 Age-related osteoporosis without current pathological fracture: Secondary | ICD-10-CM | POA: Diagnosis not present

## 2020-12-04 DIAGNOSIS — J449 Chronic obstructive pulmonary disease, unspecified: Secondary | ICD-10-CM | POA: Diagnosis not present

## 2020-12-04 DIAGNOSIS — M419 Scoliosis, unspecified: Secondary | ICD-10-CM | POA: Diagnosis not present

## 2020-12-04 DIAGNOSIS — G8929 Other chronic pain: Secondary | ICD-10-CM | POA: Diagnosis not present

## 2020-12-04 DIAGNOSIS — M545 Low back pain, unspecified: Secondary | ICD-10-CM | POA: Diagnosis not present

## 2020-12-04 DIAGNOSIS — M160 Bilateral primary osteoarthritis of hip: Secondary | ICD-10-CM | POA: Diagnosis not present

## 2020-12-08 DIAGNOSIS — M160 Bilateral primary osteoarthritis of hip: Secondary | ICD-10-CM | POA: Diagnosis not present

## 2020-12-08 DIAGNOSIS — I10 Essential (primary) hypertension: Secondary | ICD-10-CM | POA: Diagnosis not present

## 2020-12-08 DIAGNOSIS — J449 Chronic obstructive pulmonary disease, unspecified: Secondary | ICD-10-CM | POA: Diagnosis not present

## 2020-12-08 DIAGNOSIS — M170 Bilateral primary osteoarthritis of knee: Secondary | ICD-10-CM | POA: Diagnosis not present

## 2020-12-08 DIAGNOSIS — M419 Scoliosis, unspecified: Secondary | ICD-10-CM | POA: Diagnosis not present

## 2020-12-08 DIAGNOSIS — M810 Age-related osteoporosis without current pathological fracture: Secondary | ICD-10-CM | POA: Diagnosis not present

## 2020-12-08 DIAGNOSIS — M545 Low back pain, unspecified: Secondary | ICD-10-CM | POA: Diagnosis not present

## 2020-12-08 DIAGNOSIS — G8929 Other chronic pain: Secondary | ICD-10-CM | POA: Diagnosis not present

## 2020-12-08 DIAGNOSIS — M479 Spondylosis, unspecified: Secondary | ICD-10-CM | POA: Diagnosis not present

## 2020-12-21 DIAGNOSIS — E1165 Type 2 diabetes mellitus with hyperglycemia: Secondary | ICD-10-CM | POA: Diagnosis not present

## 2020-12-21 DIAGNOSIS — I10 Essential (primary) hypertension: Secondary | ICD-10-CM | POA: Diagnosis not present

## 2021-01-21 DIAGNOSIS — E1165 Type 2 diabetes mellitus with hyperglycemia: Secondary | ICD-10-CM | POA: Diagnosis not present

## 2021-01-21 DIAGNOSIS — I10 Essential (primary) hypertension: Secondary | ICD-10-CM | POA: Diagnosis not present

## 2021-02-21 DIAGNOSIS — I10 Essential (primary) hypertension: Secondary | ICD-10-CM | POA: Diagnosis not present

## 2021-02-21 DIAGNOSIS — E1165 Type 2 diabetes mellitus with hyperglycemia: Secondary | ICD-10-CM | POA: Diagnosis not present

## 2021-03-23 DIAGNOSIS — E1165 Type 2 diabetes mellitus with hyperglycemia: Secondary | ICD-10-CM | POA: Diagnosis not present

## 2021-03-23 DIAGNOSIS — I10 Essential (primary) hypertension: Secondary | ICD-10-CM | POA: Diagnosis not present

## 2021-06-28 DIAGNOSIS — I10 Essential (primary) hypertension: Secondary | ICD-10-CM | POA: Diagnosis not present

## 2021-06-28 DIAGNOSIS — M1990 Unspecified osteoarthritis, unspecified site: Secondary | ICD-10-CM | POA: Diagnosis not present

## 2021-06-28 DIAGNOSIS — E1165 Type 2 diabetes mellitus with hyperglycemia: Secondary | ICD-10-CM | POA: Diagnosis not present

## 2021-07-03 DIAGNOSIS — I10 Essential (primary) hypertension: Secondary | ICD-10-CM | POA: Diagnosis not present

## 2021-07-03 DIAGNOSIS — E1165 Type 2 diabetes mellitus with hyperglycemia: Secondary | ICD-10-CM | POA: Diagnosis not present

## 2021-07-03 DIAGNOSIS — H9203 Otalgia, bilateral: Secondary | ICD-10-CM | POA: Diagnosis not present

## 2021-07-03 DIAGNOSIS — R945 Abnormal results of liver function studies: Secondary | ICD-10-CM | POA: Diagnosis not present

## 2021-07-03 DIAGNOSIS — E870 Hyperosmolality and hypernatremia: Secondary | ICD-10-CM | POA: Diagnosis not present

## 2021-07-03 DIAGNOSIS — M1990 Unspecified osteoarthritis, unspecified site: Secondary | ICD-10-CM | POA: Diagnosis not present

## 2021-07-03 DIAGNOSIS — E1122 Type 2 diabetes mellitus with diabetic chronic kidney disease: Secondary | ICD-10-CM | POA: Diagnosis not present

## 2021-07-03 DIAGNOSIS — H9193 Unspecified hearing loss, bilateral: Secondary | ICD-10-CM | POA: Diagnosis not present

## 2021-07-03 DIAGNOSIS — E785 Hyperlipidemia, unspecified: Secondary | ICD-10-CM | POA: Diagnosis not present

## 2021-12-26 DIAGNOSIS — E559 Vitamin D deficiency, unspecified: Secondary | ICD-10-CM | POA: Diagnosis not present

## 2021-12-26 DIAGNOSIS — E1165 Type 2 diabetes mellitus with hyperglycemia: Secondary | ICD-10-CM | POA: Diagnosis not present

## 2021-12-26 DIAGNOSIS — I10 Essential (primary) hypertension: Secondary | ICD-10-CM | POA: Diagnosis not present

## 2022-01-01 DIAGNOSIS — E785 Hyperlipidemia, unspecified: Secondary | ICD-10-CM | POA: Diagnosis not present

## 2022-01-01 DIAGNOSIS — E870 Hyperosmolality and hypernatremia: Secondary | ICD-10-CM | POA: Diagnosis not present

## 2022-01-01 DIAGNOSIS — R945 Abnormal results of liver function studies: Secondary | ICD-10-CM | POA: Diagnosis not present

## 2022-01-01 DIAGNOSIS — I10 Essential (primary) hypertension: Secondary | ICD-10-CM | POA: Diagnosis not present

## 2022-01-01 DIAGNOSIS — E1165 Type 2 diabetes mellitus with hyperglycemia: Secondary | ICD-10-CM | POA: Diagnosis not present

## 2022-01-01 DIAGNOSIS — E1122 Type 2 diabetes mellitus with diabetic chronic kidney disease: Secondary | ICD-10-CM | POA: Diagnosis not present

## 2022-01-01 DIAGNOSIS — H9193 Unspecified hearing loss, bilateral: Secondary | ICD-10-CM | POA: Diagnosis not present

## 2022-01-01 DIAGNOSIS — E559 Vitamin D deficiency, unspecified: Secondary | ICD-10-CM | POA: Diagnosis not present

## 2022-01-01 DIAGNOSIS — M1990 Unspecified osteoarthritis, unspecified site: Secondary | ICD-10-CM | POA: Diagnosis not present

## 2022-01-07 DIAGNOSIS — F5101 Primary insomnia: Secondary | ICD-10-CM | POA: Diagnosis not present

## 2022-01-07 DIAGNOSIS — E7849 Other hyperlipidemia: Secondary | ICD-10-CM | POA: Diagnosis not present

## 2022-01-07 DIAGNOSIS — I10 Essential (primary) hypertension: Secondary | ICD-10-CM | POA: Diagnosis not present

## 2022-01-07 DIAGNOSIS — M810 Age-related osteoporosis without current pathological fracture: Secondary | ICD-10-CM | POA: Diagnosis not present

## 2022-01-07 DIAGNOSIS — J441 Chronic obstructive pulmonary disease with (acute) exacerbation: Secondary | ICD-10-CM | POA: Diagnosis not present

## 2022-01-07 DIAGNOSIS — E876 Hypokalemia: Secondary | ICD-10-CM | POA: Diagnosis not present

## 2022-01-07 DIAGNOSIS — R7301 Impaired fasting glucose: Secondary | ICD-10-CM | POA: Diagnosis not present

## 2022-02-05 DIAGNOSIS — G3184 Mild cognitive impairment, so stated: Secondary | ICD-10-CM | POA: Diagnosis not present

## 2022-02-05 DIAGNOSIS — F419 Anxiety disorder, unspecified: Secondary | ICD-10-CM | POA: Diagnosis not present

## 2022-02-05 DIAGNOSIS — F5101 Primary insomnia: Secondary | ICD-10-CM | POA: Diagnosis not present

## 2022-02-05 DIAGNOSIS — F32A Depression, unspecified: Secondary | ICD-10-CM | POA: Diagnosis not present

## 2022-02-08 DIAGNOSIS — Z79899 Other long term (current) drug therapy: Secondary | ICD-10-CM | POA: Diagnosis not present

## 2022-02-17 DIAGNOSIS — G309 Alzheimer's disease, unspecified: Secondary | ICD-10-CM | POA: Diagnosis not present

## 2022-02-17 DIAGNOSIS — F331 Major depressive disorder, recurrent, moderate: Secondary | ICD-10-CM | POA: Diagnosis not present

## 2022-04-12 DIAGNOSIS — F331 Major depressive disorder, recurrent, moderate: Secondary | ICD-10-CM | POA: Diagnosis not present

## 2022-04-12 DIAGNOSIS — G309 Alzheimer's disease, unspecified: Secondary | ICD-10-CM | POA: Diagnosis not present

## 2022-05-23 DIAGNOSIS — G309 Alzheimer's disease, unspecified: Secondary | ICD-10-CM | POA: Diagnosis not present

## 2022-05-23 DIAGNOSIS — F331 Major depressive disorder, recurrent, moderate: Secondary | ICD-10-CM | POA: Diagnosis not present

## 2022-05-23 DIAGNOSIS — Z79899 Other long term (current) drug therapy: Secondary | ICD-10-CM | POA: Diagnosis not present

## 2022-06-21 DIAGNOSIS — F331 Major depressive disorder, recurrent, moderate: Secondary | ICD-10-CM | POA: Diagnosis not present

## 2022-06-21 DIAGNOSIS — G309 Alzheimer's disease, unspecified: Secondary | ICD-10-CM | POA: Diagnosis not present

## 2022-06-26 DIAGNOSIS — F419 Anxiety disorder, unspecified: Secondary | ICD-10-CM | POA: Diagnosis not present

## 2022-06-26 DIAGNOSIS — G309 Alzheimer's disease, unspecified: Secondary | ICD-10-CM | POA: Diagnosis not present

## 2022-06-26 DIAGNOSIS — F02B0 Dementia in other diseases classified elsewhere, moderate, without behavioral disturbance, psychotic disturbance, mood disturbance, and anxiety: Secondary | ICD-10-CM | POA: Diagnosis not present

## 2022-06-26 DIAGNOSIS — F331 Major depressive disorder, recurrent, moderate: Secondary | ICD-10-CM | POA: Diagnosis not present

## 2022-06-26 DIAGNOSIS — F5101 Primary insomnia: Secondary | ICD-10-CM | POA: Diagnosis not present

## 2022-07-09 DIAGNOSIS — J449 Chronic obstructive pulmonary disease, unspecified: Secondary | ICD-10-CM | POA: Diagnosis not present

## 2022-07-09 DIAGNOSIS — M810 Age-related osteoporosis without current pathological fracture: Secondary | ICD-10-CM | POA: Diagnosis not present

## 2022-07-09 DIAGNOSIS — I10 Essential (primary) hypertension: Secondary | ICD-10-CM | POA: Diagnosis not present

## 2022-07-09 DIAGNOSIS — R7301 Impaired fasting glucose: Secondary | ICD-10-CM | POA: Diagnosis not present

## 2022-07-09 DIAGNOSIS — G3184 Mild cognitive impairment, so stated: Secondary | ICD-10-CM | POA: Diagnosis not present

## 2022-07-09 DIAGNOSIS — M25512 Pain in left shoulder: Secondary | ICD-10-CM | POA: Diagnosis not present

## 2022-07-09 DIAGNOSIS — E876 Hypokalemia: Secondary | ICD-10-CM | POA: Diagnosis not present

## 2022-07-09 DIAGNOSIS — R944 Abnormal results of kidney function studies: Secondary | ICD-10-CM | POA: Diagnosis not present

## 2022-07-09 DIAGNOSIS — E7849 Other hyperlipidemia: Secondary | ICD-10-CM | POA: Diagnosis not present

## 2022-07-10 DIAGNOSIS — F331 Major depressive disorder, recurrent, moderate: Secondary | ICD-10-CM | POA: Diagnosis not present

## 2022-07-10 DIAGNOSIS — G309 Alzheimer's disease, unspecified: Secondary | ICD-10-CM | POA: Diagnosis not present

## 2022-07-20 DIAGNOSIS — Z79899 Other long term (current) drug therapy: Secondary | ICD-10-CM | POA: Diagnosis not present

## 2022-07-31 DIAGNOSIS — F331 Major depressive disorder, recurrent, moderate: Secondary | ICD-10-CM | POA: Diagnosis not present

## 2022-07-31 DIAGNOSIS — G309 Alzheimer's disease, unspecified: Secondary | ICD-10-CM | POA: Diagnosis not present

## 2022-07-31 DIAGNOSIS — F5101 Primary insomnia: Secondary | ICD-10-CM | POA: Diagnosis not present

## 2022-07-31 DIAGNOSIS — F419 Anxiety disorder, unspecified: Secondary | ICD-10-CM | POA: Diagnosis not present

## 2022-07-31 DIAGNOSIS — F02B0 Dementia in other diseases classified elsewhere, moderate, without behavioral disturbance, psychotic disturbance, mood disturbance, and anxiety: Secondary | ICD-10-CM | POA: Diagnosis not present

## 2022-08-06 DIAGNOSIS — M810 Age-related osteoporosis without current pathological fracture: Secondary | ICD-10-CM | POA: Diagnosis not present

## 2022-08-06 DIAGNOSIS — M25512 Pain in left shoulder: Secondary | ICD-10-CM | POA: Diagnosis not present

## 2022-08-06 DIAGNOSIS — R944 Abnormal results of kidney function studies: Secondary | ICD-10-CM | POA: Diagnosis not present

## 2022-08-06 DIAGNOSIS — I10 Essential (primary) hypertension: Secondary | ICD-10-CM | POA: Diagnosis not present

## 2022-08-06 DIAGNOSIS — E876 Hypokalemia: Secondary | ICD-10-CM | POA: Diagnosis not present

## 2022-08-06 DIAGNOSIS — E7849 Other hyperlipidemia: Secondary | ICD-10-CM | POA: Diagnosis not present

## 2022-08-06 DIAGNOSIS — J449 Chronic obstructive pulmonary disease, unspecified: Secondary | ICD-10-CM | POA: Diagnosis not present

## 2022-08-06 DIAGNOSIS — R7301 Impaired fasting glucose: Secondary | ICD-10-CM | POA: Diagnosis not present

## 2022-08-06 DIAGNOSIS — G3184 Mild cognitive impairment, so stated: Secondary | ICD-10-CM | POA: Diagnosis not present

## 2022-08-13 DIAGNOSIS — G309 Alzheimer's disease, unspecified: Secondary | ICD-10-CM | POA: Diagnosis not present

## 2022-08-13 DIAGNOSIS — F330 Major depressive disorder, recurrent, mild: Secondary | ICD-10-CM | POA: Diagnosis not present

## 2022-08-16 DIAGNOSIS — G3184 Mild cognitive impairment, so stated: Secondary | ICD-10-CM | POA: Diagnosis not present

## 2022-08-16 DIAGNOSIS — R233 Spontaneous ecchymoses: Secondary | ICD-10-CM | POA: Diagnosis not present

## 2022-08-16 DIAGNOSIS — J449 Chronic obstructive pulmonary disease, unspecified: Secondary | ICD-10-CM | POA: Diagnosis not present

## 2022-08-16 DIAGNOSIS — E7849 Other hyperlipidemia: Secondary | ICD-10-CM | POA: Diagnosis not present

## 2022-08-16 DIAGNOSIS — I10 Essential (primary) hypertension: Secondary | ICD-10-CM | POA: Diagnosis not present

## 2022-08-16 DIAGNOSIS — M810 Age-related osteoporosis without current pathological fracture: Secondary | ICD-10-CM | POA: Diagnosis not present

## 2022-08-27 DIAGNOSIS — F419 Anxiety disorder, unspecified: Secondary | ICD-10-CM | POA: Diagnosis not present

## 2022-08-27 DIAGNOSIS — F330 Major depressive disorder, recurrent, mild: Secondary | ICD-10-CM | POA: Diagnosis not present

## 2022-08-28 DIAGNOSIS — F02B0 Dementia in other diseases classified elsewhere, moderate, without behavioral disturbance, psychotic disturbance, mood disturbance, and anxiety: Secondary | ICD-10-CM | POA: Diagnosis not present

## 2022-08-28 DIAGNOSIS — G309 Alzheimer's disease, unspecified: Secondary | ICD-10-CM | POA: Diagnosis not present

## 2022-08-28 DIAGNOSIS — F419 Anxiety disorder, unspecified: Secondary | ICD-10-CM | POA: Diagnosis not present

## 2022-08-28 DIAGNOSIS — F331 Major depressive disorder, recurrent, moderate: Secondary | ICD-10-CM | POA: Diagnosis not present

## 2022-09-03 DIAGNOSIS — R7301 Impaired fasting glucose: Secondary | ICD-10-CM | POA: Diagnosis not present

## 2022-09-03 DIAGNOSIS — R944 Abnormal results of kidney function studies: Secondary | ICD-10-CM | POA: Diagnosis not present

## 2022-09-03 DIAGNOSIS — F331 Major depressive disorder, recurrent, moderate: Secondary | ICD-10-CM | POA: Diagnosis not present

## 2022-09-03 DIAGNOSIS — M25512 Pain in left shoulder: Secondary | ICD-10-CM | POA: Diagnosis not present

## 2022-09-03 DIAGNOSIS — E876 Hypokalemia: Secondary | ICD-10-CM | POA: Diagnosis not present

## 2022-09-03 DIAGNOSIS — E7849 Other hyperlipidemia: Secondary | ICD-10-CM | POA: Diagnosis not present

## 2022-09-03 DIAGNOSIS — I10 Essential (primary) hypertension: Secondary | ICD-10-CM | POA: Diagnosis not present

## 2022-09-03 DIAGNOSIS — M810 Age-related osteoporosis without current pathological fracture: Secondary | ICD-10-CM | POA: Diagnosis not present

## 2022-09-03 DIAGNOSIS — J449 Chronic obstructive pulmonary disease, unspecified: Secondary | ICD-10-CM | POA: Diagnosis not present

## 2022-09-03 DIAGNOSIS — G3184 Mild cognitive impairment, so stated: Secondary | ICD-10-CM | POA: Diagnosis not present

## 2022-09-03 DIAGNOSIS — F419 Anxiety disorder, unspecified: Secondary | ICD-10-CM | POA: Diagnosis not present

## 2022-09-24 DIAGNOSIS — M25512 Pain in left shoulder: Secondary | ICD-10-CM | POA: Diagnosis not present

## 2022-09-24 DIAGNOSIS — G8929 Other chronic pain: Secondary | ICD-10-CM | POA: Diagnosis not present

## 2022-09-26 DIAGNOSIS — F331 Major depressive disorder, recurrent, moderate: Secondary | ICD-10-CM | POA: Diagnosis not present

## 2022-09-26 DIAGNOSIS — G309 Alzheimer's disease, unspecified: Secondary | ICD-10-CM | POA: Diagnosis not present

## 2022-09-26 DIAGNOSIS — F419 Anxiety disorder, unspecified: Secondary | ICD-10-CM | POA: Diagnosis not present

## 2022-09-26 DIAGNOSIS — F02B0 Dementia in other diseases classified elsewhere, moderate, without behavioral disturbance, psychotic disturbance, mood disturbance, and anxiety: Secondary | ICD-10-CM | POA: Diagnosis not present

## 2022-09-26 DIAGNOSIS — F5101 Primary insomnia: Secondary | ICD-10-CM | POA: Diagnosis not present

## 2022-10-01 DIAGNOSIS — J449 Chronic obstructive pulmonary disease, unspecified: Secondary | ICD-10-CM | POA: Diagnosis not present

## 2022-10-01 DIAGNOSIS — E7849 Other hyperlipidemia: Secondary | ICD-10-CM | POA: Diagnosis not present

## 2022-10-01 DIAGNOSIS — R7301 Impaired fasting glucose: Secondary | ICD-10-CM | POA: Diagnosis not present

## 2022-10-01 DIAGNOSIS — M25512 Pain in left shoulder: Secondary | ICD-10-CM | POA: Diagnosis not present

## 2022-10-01 DIAGNOSIS — F419 Anxiety disorder, unspecified: Secondary | ICD-10-CM | POA: Diagnosis not present

## 2022-10-01 DIAGNOSIS — M810 Age-related osteoporosis without current pathological fracture: Secondary | ICD-10-CM | POA: Diagnosis not present

## 2022-10-01 DIAGNOSIS — G3184 Mild cognitive impairment, so stated: Secondary | ICD-10-CM | POA: Diagnosis not present

## 2022-10-01 DIAGNOSIS — F5101 Primary insomnia: Secondary | ICD-10-CM | POA: Diagnosis not present

## 2022-10-01 DIAGNOSIS — F331 Major depressive disorder, recurrent, moderate: Secondary | ICD-10-CM | POA: Diagnosis not present

## 2022-10-01 DIAGNOSIS — G8929 Other chronic pain: Secondary | ICD-10-CM | POA: Diagnosis not present

## 2022-10-01 DIAGNOSIS — E876 Hypokalemia: Secondary | ICD-10-CM | POA: Diagnosis not present

## 2022-10-07 DIAGNOSIS — G3184 Mild cognitive impairment, so stated: Secondary | ICD-10-CM | POA: Diagnosis not present

## 2022-10-07 DIAGNOSIS — G894 Chronic pain syndrome: Secondary | ICD-10-CM | POA: Diagnosis not present

## 2022-10-07 DIAGNOSIS — F1120 Opioid dependence, uncomplicated: Secondary | ICD-10-CM | POA: Diagnosis not present

## 2022-10-14 DIAGNOSIS — E782 Mixed hyperlipidemia: Secondary | ICD-10-CM | POA: Diagnosis not present

## 2022-10-14 DIAGNOSIS — F1120 Opioid dependence, uncomplicated: Secondary | ICD-10-CM | POA: Diagnosis not present

## 2022-10-28 DIAGNOSIS — F419 Anxiety disorder, unspecified: Secondary | ICD-10-CM | POA: Diagnosis not present

## 2022-10-28 DIAGNOSIS — E782 Mixed hyperlipidemia: Secondary | ICD-10-CM | POA: Diagnosis not present

## 2022-10-28 DIAGNOSIS — M810 Age-related osteoporosis without current pathological fracture: Secondary | ICD-10-CM | POA: Diagnosis not present

## 2022-10-28 DIAGNOSIS — J449 Chronic obstructive pulmonary disease, unspecified: Secondary | ICD-10-CM | POA: Diagnosis not present

## 2022-10-29 DIAGNOSIS — F331 Major depressive disorder, recurrent, moderate: Secondary | ICD-10-CM | POA: Diagnosis not present

## 2022-10-29 DIAGNOSIS — F5101 Primary insomnia: Secondary | ICD-10-CM | POA: Diagnosis not present

## 2022-10-29 DIAGNOSIS — G309 Alzheimer's disease, unspecified: Secondary | ICD-10-CM | POA: Diagnosis not present

## 2022-10-29 DIAGNOSIS — F02B0 Dementia in other diseases classified elsewhere, moderate, without behavioral disturbance, psychotic disturbance, mood disturbance, and anxiety: Secondary | ICD-10-CM | POA: Diagnosis not present

## 2022-10-29 DIAGNOSIS — F419 Anxiety disorder, unspecified: Secondary | ICD-10-CM | POA: Diagnosis not present

## 2022-11-04 DIAGNOSIS — M545 Low back pain, unspecified: Secondary | ICD-10-CM | POA: Diagnosis not present

## 2022-11-04 DIAGNOSIS — G894 Chronic pain syndrome: Secondary | ICD-10-CM | POA: Diagnosis not present

## 2022-11-05 DIAGNOSIS — F331 Major depressive disorder, recurrent, moderate: Secondary | ICD-10-CM | POA: Diagnosis not present

## 2022-11-08 DIAGNOSIS — M6281 Muscle weakness (generalized): Secondary | ICD-10-CM | POA: Diagnosis not present

## 2022-11-08 DIAGNOSIS — I10 Essential (primary) hypertension: Secondary | ICD-10-CM | POA: Diagnosis not present

## 2022-11-08 DIAGNOSIS — E785 Hyperlipidemia, unspecified: Secondary | ICD-10-CM | POA: Diagnosis not present

## 2022-11-08 DIAGNOSIS — J449 Chronic obstructive pulmonary disease, unspecified: Secondary | ICD-10-CM | POA: Diagnosis not present

## 2022-11-08 DIAGNOSIS — D519 Vitamin B12 deficiency anemia, unspecified: Secondary | ICD-10-CM | POA: Diagnosis not present

## 2022-11-08 DIAGNOSIS — E782 Mixed hyperlipidemia: Secondary | ICD-10-CM | POA: Diagnosis not present

## 2022-11-08 DIAGNOSIS — E039 Hypothyroidism, unspecified: Secondary | ICD-10-CM | POA: Diagnosis not present

## 2022-11-08 DIAGNOSIS — E559 Vitamin D deficiency, unspecified: Secondary | ICD-10-CM | POA: Diagnosis not present

## 2022-11-12 DIAGNOSIS — N390 Urinary tract infection, site not specified: Secondary | ICD-10-CM | POA: Diagnosis not present

## 2022-11-19 DIAGNOSIS — R748 Abnormal levels of other serum enzymes: Secondary | ICD-10-CM | POA: Diagnosis not present

## 2022-11-19 DIAGNOSIS — E782 Mixed hyperlipidemia: Secondary | ICD-10-CM | POA: Diagnosis not present

## 2022-11-19 DIAGNOSIS — I119 Hypertensive heart disease without heart failure: Secondary | ICD-10-CM | POA: Diagnosis not present

## 2022-11-25 DIAGNOSIS — R600 Localized edema: Secondary | ICD-10-CM | POA: Diagnosis not present

## 2022-11-25 DIAGNOSIS — I119 Hypertensive heart disease without heart failure: Secondary | ICD-10-CM | POA: Diagnosis not present

## 2022-11-25 DIAGNOSIS — E559 Vitamin D deficiency, unspecified: Secondary | ICD-10-CM | POA: Diagnosis not present

## 2022-11-26 DIAGNOSIS — F5101 Primary insomnia: Secondary | ICD-10-CM | POA: Diagnosis not present

## 2022-11-26 DIAGNOSIS — F331 Major depressive disorder, recurrent, moderate: Secondary | ICD-10-CM | POA: Diagnosis not present

## 2022-11-26 DIAGNOSIS — F419 Anxiety disorder, unspecified: Secondary | ICD-10-CM | POA: Diagnosis not present

## 2022-11-26 DIAGNOSIS — F02B0 Dementia in other diseases classified elsewhere, moderate, without behavioral disturbance, psychotic disturbance, mood disturbance, and anxiety: Secondary | ICD-10-CM | POA: Diagnosis not present

## 2022-11-26 DIAGNOSIS — G309 Alzheimer's disease, unspecified: Secondary | ICD-10-CM | POA: Diagnosis not present

## 2022-12-09 DIAGNOSIS — G894 Chronic pain syndrome: Secondary | ICD-10-CM | POA: Diagnosis not present

## 2022-12-10 DIAGNOSIS — F331 Major depressive disorder, recurrent, moderate: Secondary | ICD-10-CM | POA: Diagnosis not present

## 2022-12-19 DIAGNOSIS — E782 Mixed hyperlipidemia: Secondary | ICD-10-CM | POA: Diagnosis not present

## 2022-12-19 DIAGNOSIS — I119 Hypertensive heart disease without heart failure: Secondary | ICD-10-CM | POA: Diagnosis not present

## 2022-12-24 DIAGNOSIS — F331 Major depressive disorder, recurrent, moderate: Secondary | ICD-10-CM | POA: Diagnosis not present

## 2022-12-24 DIAGNOSIS — F419 Anxiety disorder, unspecified: Secondary | ICD-10-CM | POA: Diagnosis not present

## 2022-12-29 DIAGNOSIS — F331 Major depressive disorder, recurrent, moderate: Secondary | ICD-10-CM | POA: Diagnosis not present

## 2022-12-29 DIAGNOSIS — G309 Alzheimer's disease, unspecified: Secondary | ICD-10-CM | POA: Diagnosis not present

## 2022-12-29 DIAGNOSIS — F5101 Primary insomnia: Secondary | ICD-10-CM | POA: Diagnosis not present

## 2022-12-29 DIAGNOSIS — F419 Anxiety disorder, unspecified: Secondary | ICD-10-CM | POA: Diagnosis not present

## 2022-12-29 DIAGNOSIS — F02B0 Dementia in other diseases classified elsewhere, moderate, without behavioral disturbance, psychotic disturbance, mood disturbance, and anxiety: Secondary | ICD-10-CM | POA: Diagnosis not present

## 2022-12-30 DIAGNOSIS — F1120 Opioid dependence, uncomplicated: Secondary | ICD-10-CM | POA: Diagnosis not present

## 2022-12-30 DIAGNOSIS — G894 Chronic pain syndrome: Secondary | ICD-10-CM | POA: Diagnosis not present

## 2022-12-31 DIAGNOSIS — F331 Major depressive disorder, recurrent, moderate: Secondary | ICD-10-CM | POA: Diagnosis not present

## 2022-12-31 DIAGNOSIS — F419 Anxiety disorder, unspecified: Secondary | ICD-10-CM | POA: Diagnosis not present

## 2023-01-13 DIAGNOSIS — R600 Localized edema: Secondary | ICD-10-CM | POA: Diagnosis not present

## 2023-01-13 DIAGNOSIS — E782 Mixed hyperlipidemia: Secondary | ICD-10-CM | POA: Diagnosis not present

## 2023-01-13 DIAGNOSIS — J449 Chronic obstructive pulmonary disease, unspecified: Secondary | ICD-10-CM | POA: Diagnosis not present

## 2023-01-14 DIAGNOSIS — F419 Anxiety disorder, unspecified: Secondary | ICD-10-CM | POA: Diagnosis not present

## 2023-01-14 DIAGNOSIS — F331 Major depressive disorder, recurrent, moderate: Secondary | ICD-10-CM | POA: Diagnosis not present

## 2023-01-21 DIAGNOSIS — F331 Major depressive disorder, recurrent, moderate: Secondary | ICD-10-CM | POA: Diagnosis not present

## 2023-01-21 DIAGNOSIS — F419 Anxiety disorder, unspecified: Secondary | ICD-10-CM | POA: Diagnosis not present

## 2023-01-24 DIAGNOSIS — I10 Essential (primary) hypertension: Secondary | ICD-10-CM | POA: Diagnosis not present

## 2023-01-24 DIAGNOSIS — E782 Mixed hyperlipidemia: Secondary | ICD-10-CM | POA: Diagnosis not present

## 2023-01-27 DIAGNOSIS — G894 Chronic pain syndrome: Secondary | ICD-10-CM | POA: Diagnosis not present

## 2023-01-27 DIAGNOSIS — F1120 Opioid dependence, uncomplicated: Secondary | ICD-10-CM | POA: Diagnosis not present

## 2023-01-27 DIAGNOSIS — E876 Hypokalemia: Secondary | ICD-10-CM | POA: Diagnosis not present

## 2023-01-28 DIAGNOSIS — F331 Major depressive disorder, recurrent, moderate: Secondary | ICD-10-CM | POA: Diagnosis not present

## 2023-01-28 DIAGNOSIS — F419 Anxiety disorder, unspecified: Secondary | ICD-10-CM | POA: Diagnosis not present

## 2023-02-10 DIAGNOSIS — I10 Essential (primary) hypertension: Secondary | ICD-10-CM | POA: Diagnosis not present

## 2023-02-10 DIAGNOSIS — I119 Hypertensive heart disease without heart failure: Secondary | ICD-10-CM | POA: Diagnosis not present

## 2023-02-10 DIAGNOSIS — J449 Chronic obstructive pulmonary disease, unspecified: Secondary | ICD-10-CM | POA: Diagnosis not present

## 2023-02-10 DIAGNOSIS — F5101 Primary insomnia: Secondary | ICD-10-CM | POA: Diagnosis not present

## 2023-02-10 DIAGNOSIS — E559 Vitamin D deficiency, unspecified: Secondary | ICD-10-CM | POA: Diagnosis not present

## 2023-02-11 DIAGNOSIS — F331 Major depressive disorder, recurrent, moderate: Secondary | ICD-10-CM | POA: Diagnosis not present

## 2023-02-11 DIAGNOSIS — F02B0 Dementia in other diseases classified elsewhere, moderate, without behavioral disturbance, psychotic disturbance, mood disturbance, and anxiety: Secondary | ICD-10-CM | POA: Diagnosis not present

## 2023-02-11 DIAGNOSIS — F5101 Primary insomnia: Secondary | ICD-10-CM | POA: Diagnosis not present

## 2023-02-11 DIAGNOSIS — F419 Anxiety disorder, unspecified: Secondary | ICD-10-CM | POA: Diagnosis not present

## 2023-02-11 DIAGNOSIS — R443 Hallucinations, unspecified: Secondary | ICD-10-CM | POA: Diagnosis not present

## 2023-02-11 DIAGNOSIS — G309 Alzheimer's disease, unspecified: Secondary | ICD-10-CM | POA: Diagnosis not present

## 2023-02-25 DIAGNOSIS — G894 Chronic pain syndrome: Secondary | ICD-10-CM | POA: Diagnosis not present

## 2023-02-25 DIAGNOSIS — F1120 Opioid dependence, uncomplicated: Secondary | ICD-10-CM | POA: Diagnosis not present

## 2023-03-10 DIAGNOSIS — M7989 Other specified soft tissue disorders: Secondary | ICD-10-CM | POA: Diagnosis not present

## 2023-03-10 DIAGNOSIS — E782 Mixed hyperlipidemia: Secondary | ICD-10-CM | POA: Diagnosis not present

## 2023-03-10 DIAGNOSIS — J449 Chronic obstructive pulmonary disease, unspecified: Secondary | ICD-10-CM | POA: Diagnosis not present

## 2023-03-10 DIAGNOSIS — M810 Age-related osteoporosis without current pathological fracture: Secondary | ICD-10-CM | POA: Diagnosis not present

## 2023-03-11 DIAGNOSIS — F02B0 Dementia in other diseases classified elsewhere, moderate, without behavioral disturbance, psychotic disturbance, mood disturbance, and anxiety: Secondary | ICD-10-CM | POA: Diagnosis not present

## 2023-03-11 DIAGNOSIS — G309 Alzheimer's disease, unspecified: Secondary | ICD-10-CM | POA: Diagnosis not present

## 2023-03-11 DIAGNOSIS — F5101 Primary insomnia: Secondary | ICD-10-CM | POA: Diagnosis not present

## 2023-03-11 DIAGNOSIS — F331 Major depressive disorder, recurrent, moderate: Secondary | ICD-10-CM | POA: Diagnosis not present

## 2023-03-11 DIAGNOSIS — I82491 Acute embolism and thrombosis of other specified deep vein of right lower extremity: Secondary | ICD-10-CM | POA: Diagnosis not present

## 2023-03-11 DIAGNOSIS — F419 Anxiety disorder, unspecified: Secondary | ICD-10-CM | POA: Diagnosis not present

## 2023-03-14 DIAGNOSIS — M6281 Muscle weakness (generalized): Secondary | ICD-10-CM | POA: Diagnosis not present

## 2023-03-14 DIAGNOSIS — J449 Chronic obstructive pulmonary disease, unspecified: Secondary | ICD-10-CM | POA: Diagnosis not present

## 2023-03-14 DIAGNOSIS — I10 Essential (primary) hypertension: Secondary | ICD-10-CM | POA: Diagnosis not present

## 2023-03-14 DIAGNOSIS — M810 Age-related osteoporosis without current pathological fracture: Secondary | ICD-10-CM | POA: Diagnosis not present

## 2023-03-17 DIAGNOSIS — S8011XA Contusion of right lower leg, initial encounter: Secondary | ICD-10-CM | POA: Diagnosis not present

## 2023-03-17 DIAGNOSIS — J449 Chronic obstructive pulmonary disease, unspecified: Secondary | ICD-10-CM | POA: Diagnosis not present

## 2023-03-17 DIAGNOSIS — G894 Chronic pain syndrome: Secondary | ICD-10-CM | POA: Diagnosis not present

## 2023-03-17 DIAGNOSIS — I10 Essential (primary) hypertension: Secondary | ICD-10-CM | POA: Diagnosis not present

## 2023-03-17 DIAGNOSIS — F1120 Opioid dependence, uncomplicated: Secondary | ICD-10-CM | POA: Diagnosis not present

## 2023-03-18 DIAGNOSIS — F419 Anxiety disorder, unspecified: Secondary | ICD-10-CM | POA: Diagnosis not present

## 2023-03-18 DIAGNOSIS — F331 Major depressive disorder, recurrent, moderate: Secondary | ICD-10-CM | POA: Diagnosis not present

## 2023-03-25 DIAGNOSIS — F331 Major depressive disorder, recurrent, moderate: Secondary | ICD-10-CM | POA: Diagnosis not present

## 2023-03-25 DIAGNOSIS — F419 Anxiety disorder, unspecified: Secondary | ICD-10-CM | POA: Diagnosis not present

## 2023-03-31 DIAGNOSIS — E559 Vitamin D deficiency, unspecified: Secondary | ICD-10-CM | POA: Diagnosis not present

## 2023-03-31 DIAGNOSIS — I119 Hypertensive heart disease without heart failure: Secondary | ICD-10-CM | POA: Diagnosis not present

## 2023-03-31 DIAGNOSIS — E782 Mixed hyperlipidemia: Secondary | ICD-10-CM | POA: Diagnosis not present

## 2023-04-08 DIAGNOSIS — G309 Alzheimer's disease, unspecified: Secondary | ICD-10-CM | POA: Diagnosis not present

## 2023-04-08 DIAGNOSIS — F331 Major depressive disorder, recurrent, moderate: Secondary | ICD-10-CM | POA: Diagnosis not present

## 2023-04-08 DIAGNOSIS — F5101 Primary insomnia: Secondary | ICD-10-CM | POA: Diagnosis not present

## 2023-04-08 DIAGNOSIS — F02B0 Dementia in other diseases classified elsewhere, moderate, without behavioral disturbance, psychotic disturbance, mood disturbance, and anxiety: Secondary | ICD-10-CM | POA: Diagnosis not present

## 2023-04-08 DIAGNOSIS — F419 Anxiety disorder, unspecified: Secondary | ICD-10-CM | POA: Diagnosis not present

## 2023-04-08 DIAGNOSIS — G894 Chronic pain syndrome: Secondary | ICD-10-CM | POA: Diagnosis not present

## 2023-04-14 DIAGNOSIS — G894 Chronic pain syndrome: Secondary | ICD-10-CM | POA: Diagnosis not present

## 2023-04-15 DIAGNOSIS — F331 Major depressive disorder, recurrent, moderate: Secondary | ICD-10-CM | POA: Diagnosis not present

## 2023-04-24 DIAGNOSIS — I10 Essential (primary) hypertension: Secondary | ICD-10-CM | POA: Diagnosis not present

## 2023-04-24 DIAGNOSIS — J449 Chronic obstructive pulmonary disease, unspecified: Secondary | ICD-10-CM | POA: Diagnosis not present

## 2023-04-28 DIAGNOSIS — M810 Age-related osteoporosis without current pathological fracture: Secondary | ICD-10-CM | POA: Diagnosis not present

## 2023-04-28 DIAGNOSIS — F331 Major depressive disorder, recurrent, moderate: Secondary | ICD-10-CM | POA: Diagnosis not present

## 2023-04-28 DIAGNOSIS — J449 Chronic obstructive pulmonary disease, unspecified: Secondary | ICD-10-CM | POA: Diagnosis not present

## 2023-05-02 DIAGNOSIS — F1120 Opioid dependence, uncomplicated: Secondary | ICD-10-CM | POA: Diagnosis not present

## 2023-05-02 DIAGNOSIS — F330 Major depressive disorder, recurrent, mild: Secondary | ICD-10-CM | POA: Diagnosis not present

## 2023-05-06 DIAGNOSIS — F331 Major depressive disorder, recurrent, moderate: Secondary | ICD-10-CM | POA: Diagnosis not present

## 2023-05-12 DIAGNOSIS — G894 Chronic pain syndrome: Secondary | ICD-10-CM | POA: Diagnosis not present

## 2023-05-12 DIAGNOSIS — J449 Chronic obstructive pulmonary disease, unspecified: Secondary | ICD-10-CM | POA: Diagnosis not present

## 2023-05-12 DIAGNOSIS — F1120 Opioid dependence, uncomplicated: Secondary | ICD-10-CM | POA: Diagnosis not present

## 2023-05-12 DIAGNOSIS — I10 Essential (primary) hypertension: Secondary | ICD-10-CM | POA: Diagnosis not present

## 2023-05-13 DIAGNOSIS — F02B0 Dementia in other diseases classified elsewhere, moderate, without behavioral disturbance, psychotic disturbance, mood disturbance, and anxiety: Secondary | ICD-10-CM | POA: Diagnosis not present

## 2023-05-13 DIAGNOSIS — F419 Anxiety disorder, unspecified: Secondary | ICD-10-CM | POA: Diagnosis not present

## 2023-05-13 DIAGNOSIS — F5101 Primary insomnia: Secondary | ICD-10-CM | POA: Diagnosis not present

## 2023-05-13 DIAGNOSIS — G309 Alzheimer's disease, unspecified: Secondary | ICD-10-CM | POA: Diagnosis not present

## 2023-05-13 DIAGNOSIS — F331 Major depressive disorder, recurrent, moderate: Secondary | ICD-10-CM | POA: Diagnosis not present

## 2023-05-20 DIAGNOSIS — F331 Major depressive disorder, recurrent, moderate: Secondary | ICD-10-CM | POA: Diagnosis not present

## 2023-05-26 DIAGNOSIS — S60222A Contusion of left hand, initial encounter: Secondary | ICD-10-CM | POA: Diagnosis not present

## 2023-05-26 DIAGNOSIS — E782 Mixed hyperlipidemia: Secondary | ICD-10-CM | POA: Diagnosis not present

## 2023-05-26 DIAGNOSIS — I119 Hypertensive heart disease without heart failure: Secondary | ICD-10-CM | POA: Diagnosis not present

## 2023-05-27 DIAGNOSIS — M25532 Pain in left wrist: Secondary | ICD-10-CM | POA: Diagnosis not present

## 2023-05-27 DIAGNOSIS — M79642 Pain in left hand: Secondary | ICD-10-CM | POA: Diagnosis not present

## 2023-05-29 ENCOUNTER — Encounter: Payer: Self-pay | Admitting: Orthopaedic Surgery

## 2023-05-29 ENCOUNTER — Ambulatory Visit: Payer: Medicare PPO | Admitting: Orthopaedic Surgery

## 2023-05-29 VITALS — BP 160/77 | HR 105 | Ht 63.0 in | Wt 126.2 lb

## 2023-05-29 DIAGNOSIS — S62643A Nondisplaced fracture of proximal phalanx of left middle finger, initial encounter for closed fracture: Secondary | ICD-10-CM

## 2023-05-29 NOTE — Progress Notes (Signed)
Subjective:    Patient ID: Connie Wood, female    DOB: March 30, 1941, 82 y.o.   MRN: [id number]  HPI She is a resident at local nursing home.  She hurt her left long finger last week, no one witnessed her injury.  X-rays were done at the nursing home showing a nondisplaced fracture of the left long finger proximal phalanx. She has ecchymosis.  I have the nursing home notes and have reviewed them.  No X-rays were sent but I have the report.  She is very hard of hearing.   Review of Systems  Constitutional:  Positive for activity change.  HENT:  Positive for hearing loss.   Musculoskeletal:  Positive for arthralgias, back pain and joint swelling.  For Review of Systems, all other systems reviewed and are negative.  The following is a summary of the past history medically, past history surgically, known current medicines, social history and family history.  This information is gathered electronically by the computer from prior information and documentation.  I review this each visit and have found including this information at this point in the chart is beneficial and informative.   Past Medical History:  Diagnosis Date   Back pain    Cat scratch of right forearm 02/03/2014   Dyspnea    Hard of hearing    Hemorrhoid 04/06/2014   History of kidney stones    Hx of degenerative disc disease    Hypercholesterolemia    Hypertension    Migraines    Osteoarthritis    Stroke (HCC)    mini stroke, resolved in 30 minutes    Past Surgical History:  Procedure Laterality Date   ABDOMINAL HYSTERECTOMY     ANTERIOR AND POSTERIOR REPAIR     APPENDECTOMY     BILATERAL SALPINGOOPHORECTOMY     BREAST SURGERY     CARDIAC CATHETERIZATION     CHOLECYSTECTOMY     colonoscopy  2003   Dr. Jena Gauss: normal rectum, scattered diverticula   COLONOSCOPY WITH PROPOFOL N/A 01/01/2016   Procedure: COLONOSCOPY WITH PROPOFOL;  Surgeon: Corbin Ade, MD;  Location: [facility];  Service: Endoscopy;   Laterality: N/A;  1015   ERCP with sphincterotomy  2003   Dr. Jena Gauss: balloon dredging of bile duct, normal ampulla, choledocholithiasis    EUS  2016   Dr. Logan Bores: subtle 13X17 mm heterogenous lesion in pancreas neck, FNA negative for malignancy    LEFT HEART CATH AND CORONARY ANGIOGRAPHY N/A 04/23/2018   Procedure: LEFT HEART CATH AND CORONARY ANGIOGRAPHY;  Surgeon: Lyn Records, MD;  Location: [facility];  Service: Cardiovascular;  Laterality: N/A;   POLYPECTOMY  01/01/2016   Procedure: POLYPECTOMY;  Surgeon: Corbin Ade, MD;  Location: [facility];  Service: Endoscopy;;   polypectomies x2-splenic flexure and cecal   TONSILLECTOMY      Current Outpatient Medications on File Prior to Visit  Medication Sig Dispense Refill   acetaminophen (TYLENOL) 500 MG tablet Take 2 tablets (1,000 mg total) by mouth every 8 (eight) hours as needed for mild pain, moderate pain or headache.     albuterol (PROVENTIL HFA;VENTOLIN HFA) 108 (90 Base) MCG/ACT inhaler Inhale 2 puffs into the lungs every 6 (six) hours as needed for wheezing or shortness of breath. 1 Inhaler 0   aspirin 81 MG chewable tablet Chew 1 tablet (81 mg total) by mouth daily. 30 tablet 0   atorvastatin (LIPITOR) 80 MG tablet Take 1 tablet (80 mg total) by mouth  daily at 6 PM. 30 tablet 0   azithromycin (ZITHROMAX) 250 MG tablet Take 2 tablets daily for 3 days 6 each 0   AZOR 10-40 MG per tablet Take 1 tablet by mouth daily. Reported on 12/04/2015     benzonatate (TESSALON) 100 MG capsule Take 1 capsule (100 mg total) by mouth every 8 (eight) hours. 30 capsule 0   Calcium Citrate (CITRACAL PO) Take 1 tablet by mouth daily.      Cholecalciferol (VITAMIN D PO) Take 1 tablet by mouth daily.      clopidogrel (PLAVIX) 75 MG tablet Take 1 tablet (75 mg total) by mouth daily. 30 tablet 0   fluconazole (DIFLUCAN) 150 MG tablet fluconazole 150 mg tablet  TK 1 T PO TODAY THEN TK 1 T PO 3 DAYS LATER     furosemide (LASIX) 20 MG tablet Take  1 tablet (20 mg total) by mouth daily. 30 tablet 1   hydrOXYzine (VISTARIL) 50 MG capsule Take 1 capsule by mouth daily.     loperamide (IMODIUM) 2 MG capsule Take 4 mg by mouth as needed for diarrhea or loose stools.     LORazepam (ATIVAN) 1 MG tablet Take 1 tablet by mouth at bedtime as needed. Take a 1/2 to 1 tablet by mouth at bedtime.     metoprolol tartrate (LOPRESSOR) 100 MG tablet Take 1 tablet (100 mg total) by mouth 2 (two) times daily. 60 tablet 0   ofloxacin (OCUFLOX) 0.3 % ophthalmic solution Place 2 drops into the right ear daily as needed (for pain). 2 drops in right ear daily prn.     oxyCODONE-acetaminophen (PERCOCET) 10-325 MG tablet Take 1 tablet by mouth every 6 (six) hours as needed.     potassium chloride (K-DUR,KLOR-CON) 10 MEQ tablet Take 1 tablet (10 mEq total) by mouth daily. 30 tablet 0   umeclidinium-vilanterol (ANORO ELLIPTA) 62.5-25 MCG/INH AEPB Inhale 1 puff into the lungs daily.     fluticasone (FLONASE) 50 MCG/ACT nasal spray Place 1 spray into both nostrils daily for 14 days. 16 g 0   No current facility-administered medications on file prior to visit.    Social History   Socioeconomic History   Marital status: Widowed    Spouse name: Not on file   Number of children: Not on file   Years of education: Not on file   Highest education level: Not on file  Occupational History   Not on file  Tobacco Use   Smoking status: Former    Current packs/day: 1.00    Average packs/day: 1 pack/day for 50.0 years (50.0 ttl pk-yrs)    Types: Cigarettes   Smokeless tobacco: Never  Vaping Use   Vaping status: Never Used  Substance and Sexual Activity   Alcohol use: No    Alcohol/week: 0.0 standard drinks of alcohol   Drug use: No   Sexual activity: Not Currently    Birth control/protection: Post-menopausal, Surgical    Comment: hyst  Other Topics Concern   Not on file  Social History Narrative   Not on file   Social Determinants of Health   Financial  Resource Strain: Not on file  Food Insecurity: Not on file  Transportation Needs: Not on file  Physical Activity: Not on file  Stress: Not on file  Social Connections: Not on file  Intimate Partner Violence: Not on file    Family History  Problem Relation Age of Onset   Heart disease Mother    Cancer Father  liver   Cancer Son    Heart disease Son    Cancer Maternal Grandmother    Colon cancer Neg Hx     BP (!) 160/77   Pulse (!) 105   Ht 5\' 3"  (1.6 m)   Wt 126 lb 4 oz (57.3 kg)   BMI 22.36 kg/m   Body mass index is 22.36 kg/m.      Objective:   Physical Exam Vitals and nursing note reviewed. Exam conducted with a chaperone present.  Constitutional:      Appearance: She is well-developed.  HENT:     Head: Normocephalic and atraumatic.  Eyes:     Conjunctiva/sclera: Conjunctivae normal.     Pupils: Pupils are equal, round, and reactive to light.  Cardiovascular:     Rate and Rhythm: Normal rate and regular rhythm.  Pulmonary:     Effort: Pulmonary effort is normal.  Abdominal:     Palpations: Abdomen is soft.  Musculoskeletal:       Hands:     Cervical back: Normal range of motion and neck supple.  Skin:    General: Skin is warm and dry.  Neurological:     Mental Status: She is alert and oriented to person, place, and time.     Cranial Nerves: No cranial nerve deficit.     Motor: No abnormal muscle tone.     Coordination: Coordination normal.     Deep Tendon Reflexes: Reflexes are normal and symmetric. Reflexes normal.  Psychiatric:        Behavior: Behavior normal.        Thought Content: Thought content normal.        Judgment: Judgment normal.           Assessment & Plan:   Encounter Diagnosis  Name Primary?   Closed nondisplaced fracture of proximal phalanx of left middle finger, initial encounter Yes   I have applied dorsal aluminum splint.  Return in two weeks.  Instructions for splint given.  X-rays of the left middle  finger on return.  Call if any problem.  Precautions discussed.  Electronically Signed Darreld Mclean, MD 12/5/20248:29 AM

## 2023-06-02 DIAGNOSIS — G894 Chronic pain syndrome: Secondary | ICD-10-CM | POA: Diagnosis not present

## 2023-06-02 DIAGNOSIS — S62623A Displaced fracture of medial phalanx of left middle finger, initial encounter for closed fracture: Secondary | ICD-10-CM | POA: Diagnosis not present

## 2023-06-06 DIAGNOSIS — E119 Type 2 diabetes mellitus without complications: Secondary | ICD-10-CM | POA: Diagnosis not present

## 2023-06-06 DIAGNOSIS — E039 Hypothyroidism, unspecified: Secondary | ICD-10-CM | POA: Diagnosis not present

## 2023-06-06 DIAGNOSIS — E782 Mixed hyperlipidemia: Secondary | ICD-10-CM | POA: Diagnosis not present

## 2023-06-06 DIAGNOSIS — E559 Vitamin D deficiency, unspecified: Secondary | ICD-10-CM | POA: Diagnosis not present

## 2023-06-06 DIAGNOSIS — I10 Essential (primary) hypertension: Secondary | ICD-10-CM | POA: Diagnosis not present

## 2023-06-12 ENCOUNTER — Encounter: Payer: Medicare PPO | Admitting: Orthopaedic Surgery

## 2023-06-12 ENCOUNTER — Telehealth: Payer: Self-pay | Admitting: Orthopedic Surgery

## 2023-06-12 NOTE — Telephone Encounter (Signed)
DR. Kandy Garrison from [facility] called at 8:54 LVM stating it is to cold for the patient to come out and she doesn't want to come she has dementia.  She doesn't know if we can to a relephone visit.  Please call them back at [phone]

## 2023-06-13 NOTE — Telephone Encounter (Signed)
Dr. Hilda Lias patient, she had an appointment scheduled for 06/12/23 but had to cancel. Please advise.

## 2023-06-16 DIAGNOSIS — F331 Major depressive disorder, recurrent, moderate: Secondary | ICD-10-CM | POA: Diagnosis not present

## 2023-06-19 DIAGNOSIS — F331 Major depressive disorder, recurrent, moderate: Secondary | ICD-10-CM | POA: Diagnosis not present

## 2023-06-19 DIAGNOSIS — F419 Anxiety disorder, unspecified: Secondary | ICD-10-CM | POA: Diagnosis not present

## 2023-06-19 DIAGNOSIS — F02B0 Dementia in other diseases classified elsewhere, moderate, without behavioral disturbance, psychotic disturbance, mood disturbance, and anxiety: Secondary | ICD-10-CM | POA: Diagnosis not present

## 2023-06-19 DIAGNOSIS — G894 Chronic pain syndrome: Secondary | ICD-10-CM | POA: Diagnosis not present

## 2023-06-19 DIAGNOSIS — G309 Alzheimer's disease, unspecified: Secondary | ICD-10-CM | POA: Diagnosis not present

## 2023-06-19 DIAGNOSIS — F5101 Primary insomnia: Secondary | ICD-10-CM | POA: Diagnosis not present

## 2023-06-20 DIAGNOSIS — F1120 Opioid dependence, uncomplicated: Secondary | ICD-10-CM | POA: Diagnosis not present

## 2023-06-20 DIAGNOSIS — J449 Chronic obstructive pulmonary disease, unspecified: Secondary | ICD-10-CM | POA: Diagnosis not present

## 2023-06-20 DIAGNOSIS — I10 Essential (primary) hypertension: Secondary | ICD-10-CM | POA: Diagnosis not present

## 2023-06-20 DIAGNOSIS — F330 Major depressive disorder, recurrent, mild: Secondary | ICD-10-CM | POA: Diagnosis not present

## 2023-06-23 DIAGNOSIS — U071 COVID-19: Secondary | ICD-10-CM | POA: Diagnosis not present

## 2023-06-30 DIAGNOSIS — U071 COVID-19: Secondary | ICD-10-CM | POA: Diagnosis not present

## 2023-06-30 DIAGNOSIS — J449 Chronic obstructive pulmonary disease, unspecified: Secondary | ICD-10-CM | POA: Diagnosis not present

## 2023-06-30 DIAGNOSIS — E559 Vitamin D deficiency, unspecified: Secondary | ICD-10-CM | POA: Diagnosis not present

## 2023-07-07 DIAGNOSIS — G894 Chronic pain syndrome: Secondary | ICD-10-CM | POA: Diagnosis not present

## 2023-07-17 ENCOUNTER — Encounter: Payer: Self-pay | Admitting: Orthopaedic Surgery

## 2023-07-17 ENCOUNTER — Ambulatory Visit: Payer: Medicare PPO | Admitting: Orthopaedic Surgery

## 2023-07-17 ENCOUNTER — Other Ambulatory Visit (INDEPENDENT_AMBULATORY_CARE_PROVIDER_SITE_OTHER): Payer: Self-pay

## 2023-07-17 DIAGNOSIS — H906 Mixed conductive and sensorineural hearing loss, bilateral: Secondary | ICD-10-CM | POA: Insufficient documentation

## 2023-07-17 DIAGNOSIS — S62643D Nondisplaced fracture of proximal phalanx of left middle finger, subsequent encounter for fracture with routine healing: Secondary | ICD-10-CM

## 2023-07-17 DIAGNOSIS — S62643A Nondisplaced fracture of proximal phalanx of left middle finger, initial encounter for closed fracture: Secondary | ICD-10-CM

## 2023-07-17 NOTE — Progress Notes (Signed)
It does not hurt.  She is accompanied by a representative from the nursing home.  She has poor memory and hard of hearing.    Left long finger has full ROM and no pain.  NV intact.  X-rays were done of the left hand, reported separately.  Encounter Diagnosis  Name Primary?   Closed nondisplaced fracture of proximal phalanx of left middle finger with routine healing, subsequent encounter Yes   Discharge.  Call if any problem.  Precautions discussed.  Electronically Signed Darreld Mclean, MD 1/23/20258:22 AM  Forms completed for the nursing home.

## 2023-07-21 DIAGNOSIS — I119 Hypertensive heart disease without heart failure: Secondary | ICD-10-CM | POA: Diagnosis not present

## 2023-07-21 DIAGNOSIS — E782 Mixed hyperlipidemia: Secondary | ICD-10-CM | POA: Diagnosis not present

## 2023-07-22 DIAGNOSIS — F331 Major depressive disorder, recurrent, moderate: Secondary | ICD-10-CM | POA: Diagnosis not present

## 2023-07-22 DIAGNOSIS — F02B0 Dementia in other diseases classified elsewhere, moderate, without behavioral disturbance, psychotic disturbance, mood disturbance, and anxiety: Secondary | ICD-10-CM | POA: Diagnosis not present

## 2023-07-22 DIAGNOSIS — F5101 Primary insomnia: Secondary | ICD-10-CM | POA: Diagnosis not present

## 2023-07-22 DIAGNOSIS — F419 Anxiety disorder, unspecified: Secondary | ICD-10-CM | POA: Diagnosis not present

## 2023-07-22 DIAGNOSIS — G309 Alzheimer's disease, unspecified: Secondary | ICD-10-CM | POA: Diagnosis not present

## 2023-07-26 ENCOUNTER — Emergency Department (HOSPITAL_COMMUNITY): Payer: Medicare PPO

## 2023-07-26 ENCOUNTER — Emergency Department (HOSPITAL_COMMUNITY)
Admission: EM | Admit: 2023-07-26 | Discharge: 2023-07-27 | Disposition: A | Discharge status: Home / Self Care | Payer: Medicare PPO | Attending: Emergency Medicine | Admitting: Emergency Medicine

## 2023-07-26 ENCOUNTER — Encounter (HOSPITAL_COMMUNITY): Payer: Self-pay | Admitting: Emergency Medicine

## 2023-07-26 ENCOUNTER — Other Ambulatory Visit: Payer: Self-pay

## 2023-07-26 DIAGNOSIS — Z91040 Latex allergy status: Secondary | ICD-10-CM | POA: Diagnosis not present

## 2023-07-26 DIAGNOSIS — R32 Unspecified urinary incontinence: Secondary | ICD-10-CM | POA: Insufficient documentation

## 2023-07-26 DIAGNOSIS — F0390 Unspecified dementia without behavioral disturbance: Secondary | ICD-10-CM | POA: Diagnosis not present

## 2023-07-26 DIAGNOSIS — T148XXA Other injury of unspecified body region, initial encounter: Secondary | ICD-10-CM | POA: Diagnosis not present

## 2023-07-26 DIAGNOSIS — S299XXA Unspecified injury of thorax, initial encounter: Secondary | ICD-10-CM | POA: Diagnosis not present

## 2023-07-26 DIAGNOSIS — S199XXA Unspecified injury of neck, initial encounter: Secondary | ICD-10-CM | POA: Diagnosis not present

## 2023-07-26 DIAGNOSIS — I6203 Nontraumatic chronic subdural hemorrhage: Secondary | ICD-10-CM

## 2023-07-26 DIAGNOSIS — S3991XA Unspecified injury of abdomen, initial encounter: Secondary | ICD-10-CM | POA: Insufficient documentation

## 2023-07-26 DIAGNOSIS — I6200 Nontraumatic subdural hemorrhage, unspecified: Secondary | ICD-10-CM | POA: Diagnosis not present

## 2023-07-26 DIAGNOSIS — Y92129 Unspecified place in nursing home as the place of occurrence of the external cause: Secondary | ICD-10-CM | POA: Insufficient documentation

## 2023-07-26 DIAGNOSIS — R918 Other nonspecific abnormal finding of lung field: Secondary | ICD-10-CM | POA: Diagnosis not present

## 2023-07-26 DIAGNOSIS — Z043 Encounter for examination and observation following other accident: Secondary | ICD-10-CM | POA: Diagnosis not present

## 2023-07-26 DIAGNOSIS — I2510 Atherosclerotic heart disease of native coronary artery without angina pectoris: Secondary | ICD-10-CM | POA: Diagnosis not present

## 2023-07-26 DIAGNOSIS — M549 Dorsalgia, unspecified: Secondary | ICD-10-CM | POA: Insufficient documentation

## 2023-07-26 DIAGNOSIS — Z79899 Other long term (current) drug therapy: Secondary | ICD-10-CM | POA: Diagnosis not present

## 2023-07-26 DIAGNOSIS — M47812 Spondylosis without myelopathy or radiculopathy, cervical region: Secondary | ICD-10-CM | POA: Diagnosis not present

## 2023-07-26 DIAGNOSIS — I700 Atherosclerosis of aorta: Secondary | ICD-10-CM | POA: Diagnosis not present

## 2023-07-26 DIAGNOSIS — S0990XA Unspecified injury of head, initial encounter: Secondary | ICD-10-CM | POA: Diagnosis present

## 2023-07-26 DIAGNOSIS — M47816 Spondylosis without myelopathy or radiculopathy, lumbar region: Secondary | ICD-10-CM | POA: Diagnosis not present

## 2023-07-26 DIAGNOSIS — I10 Essential (primary) hypertension: Secondary | ICD-10-CM | POA: Diagnosis not present

## 2023-07-26 DIAGNOSIS — M50323 Other cervical disc degeneration at C6-C7 level: Secondary | ICD-10-CM | POA: Diagnosis not present

## 2023-07-26 DIAGNOSIS — N200 Calculus of kidney: Secondary | ICD-10-CM | POA: Diagnosis not present

## 2023-07-26 DIAGNOSIS — Z7982 Long term (current) use of aspirin: Secondary | ICD-10-CM | POA: Insufficient documentation

## 2023-07-26 DIAGNOSIS — W19XXXA Unspecified fall, initial encounter: Secondary | ICD-10-CM | POA: Insufficient documentation

## 2023-07-26 DIAGNOSIS — S065X0A Traumatic subdural hemorrhage without loss of consciousness, initial encounter: Secondary | ICD-10-CM | POA: Diagnosis not present

## 2023-07-26 DIAGNOSIS — R5381 Other malaise: Secondary | ICD-10-CM | POA: Diagnosis not present

## 2023-07-26 DIAGNOSIS — M8580 Other specified disorders of bone density and structure, unspecified site: Secondary | ICD-10-CM | POA: Diagnosis not present

## 2023-07-26 DIAGNOSIS — J439 Emphysema, unspecified: Secondary | ICD-10-CM | POA: Diagnosis not present

## 2023-07-26 DIAGNOSIS — M1611 Unilateral primary osteoarthritis, right hip: Secondary | ICD-10-CM | POA: Diagnosis not present

## 2023-07-26 DIAGNOSIS — M51369 Other intervertebral disc degeneration, lumbar region without mention of lumbar back pain or lower extremity pain: Secondary | ICD-10-CM | POA: Diagnosis not present

## 2023-07-26 LAB — COMPREHENSIVE METABOLIC PANEL
ALT: 11 U/L (ref 0–44)
AST: 20 U/L (ref 15–41)
Albumin: 3.7 g/dL (ref 3.5–5.0)
Alkaline Phosphatase: 93 U/L (ref 38–126)
Anion gap: 13 (ref 5–15)
BUN: 15 mg/dL (ref 8–23)
CO2: 26 mmol/L (ref 22–32)
Calcium: 9.4 mg/dL (ref 8.9–10.3)
Chloride: 100 mmol/L (ref 98–111)
Creatinine, Ser: 1.13 mg/dL — ABNORMAL HIGH (ref 0.44–1.00)
GFR, Estimated: 49 mL/min — ABNORMAL LOW (ref >60)
Glucose, Bld: 102 mg/dL — ABNORMAL HIGH (ref 70–99)
Potassium: 3.6 mmol/L (ref 3.5–5.1)
Sodium: 139 mmol/L (ref 135–145)
Total Bilirubin: 0.9 mg/dL (ref 0.0–1.2)
Total Protein: 7.1 g/dL (ref 6.5–8.1)

## 2023-07-26 LAB — URINALYSIS, ROUTINE W REFLEX MICROSCOPIC
Bilirubin Urine: NEGATIVE
Glucose, UA: NEGATIVE mg/dL
Hgb urine dipstick: NEGATIVE
Ketones, ur: NEGATIVE mg/dL
Leukocytes,Ua: NEGATIVE
Nitrite: NEGATIVE
Protein, ur: NEGATIVE mg/dL
Specific Gravity, Urine: 1.008 (ref 1.005–1.030)
pH: 7 (ref 5.0–8.0)

## 2023-07-26 LAB — CBC
HCT: 39.8 % (ref 36.0–46.0)
Hemoglobin: 13 g/dL (ref 12.0–15.0)
MCH: 31.3 pg (ref 26.0–34.0)
MCHC: 32.7 g/dL (ref 30.0–36.0)
MCV: 95.9 fL (ref 80.0–100.0)
Platelets: 255 10*3/uL (ref 150–400)
RBC: 4.15 MIL/uL (ref 3.87–5.11)
RDW: 13.5 % (ref 11.5–15.5)
WBC: 8 10*3/uL (ref 4.0–10.5)
nRBC: 0 % (ref 0.0–0.2)

## 2023-07-26 MED ORDER — SODIUM CHLORIDE 0.9 % IV SOLN: INTRAVENOUS | Status: DC

## 2023-07-26 MED ORDER — IOHEXOL 300 MG/ML  SOLN
100.0000 mL | Freq: Once | INTRAMUSCULAR | Status: AC | PRN
  Administered 2023-07-26: 100 mL via INTRAVENOUS

## 2023-07-26 NOTE — ED Triage Notes (Signed)
Pt arrived via [facility] from [place] c/o unwitnessed fall, unknown how long pt has been down for, unsure of LOC, unknown blood thinners, unknown if pt hit head. Hx of dementia

## 2023-07-26 NOTE — ED Notes (Signed)
Confirmed request for convo has been made of local EMS.

## 2023-07-26 NOTE — ED Provider Notes (Incomplete)
This patient is an elderly 83 year old female with dementia coming from a nursing facility, there has been multiple falls and she was found on the ground today.  The patient has ptosis of her left eyelid and may be a subtle facial droop but is able to follow commands otherwise, she does not answer questions, she is awake, intermittently somnolent.  According to the nurses at the facility she is at her baseline.  CT scan reveals bilateral subdural hematomas, will need to discuss with neurosurgery, the patient does not appear to be a good surgical candidate, she is not anticoagulated.  On further exam of her head I do not see any signs of acute trauma hemorrhage lacerations ecchymosis bruising or tenderness.

## 2023-07-26 NOTE — ED Provider Notes (Signed)
[place] EMERGENCY DEPARTMENT AT [facility] Provider Note   CSN: [id number] Arrival date & time: 07/26/23  1016     History  Chief Complaint  Patient presents with   Connie Wood is a 83 y.o. female with history of stasis edema, chronic bronchitis, hypertension, hyperlipidemia, pancreatitis, NSTEMI, IBS, CAD, GERD, dementia who presents the emergency department from [facility] facility with concern for fall.  Patient is mainly nonverbal at baseline.  Information provided by staff at facility, they found patient on the floor with her head against the bedpost at around 0930 this a.m.  They report that patient was complaining of some head and back pain.  They note that she is currently at her baseline mental status, only speaks in short phrases or sentences intermittently.  They note that she has maybe had some decline of her cognitive function gradually.  Also report worsening urinary incontinence with foul-smelling urine, and they had concern for possible UTI.  Level 5 caveat due to dementia  Fall       Home Medications Prior to Admission medications   Medication Sig Start Date End Date Taking? Authorizing Provider  acetaminophen (TYLENOL) 500 MG tablet Take 2 tablets (1,000 mg total) by mouth every 8 (eight) hours as needed for mild pain, moderate pain or headache. 04/25/18   Hongalgi, Maximino Greenland, MD  albuterol (PROVENTIL HFA;VENTOLIN HFA) 108 (90 Base) MCG/ACT inhaler Inhale 2 puffs into the lungs every 6 (six) hours as needed for wheezing or shortness of breath. 04/25/18   Hongalgi, Maximino Greenland, MD  aspirin 81 MG chewable tablet Chew 1 tablet (81 mg total) by mouth daily. 04/25/18   Hongalgi, Maximino Greenland, MD  atorvastatin (LIPITOR) 80 MG tablet Take 1 tablet (80 mg total) by mouth daily at 6 PM. 04/25/18   Hongalgi, Maximino Greenland, MD  azithromycin (ZITHROMAX) 250 MG tablet Take 2 tablets daily for 3 days 05/06/18   Alwyn Ren, MD  AZOR 10-40 MG per tablet  Take 1 tablet by mouth daily. Reported on 12/04/2015 07/06/13   [provider]  benzonatate (TESSALON) 100 MG capsule Take 1 capsule (100 mg total) by mouth every 8 (eight) hours. 03/05/20   Avegno, Zachery Dakins, FNP  Calcium Citrate (CITRACAL PO) Take 1 tablet by mouth daily.     [provider]  Cholecalciferol (VITAMIN D PO) Take 1 tablet by mouth daily.     [provider]  clopidogrel (PLAVIX) 75 MG tablet Take 1 tablet (75 mg total) by mouth daily. 04/26/18   Hongalgi, Maximino Greenland, MD  fluconazole (DIFLUCAN) 150 MG tablet fluconazole 150 mg tablet  TK 1 T PO TODAY THEN TK 1 T PO 3 DAYS LATER    [provider]  fluticasone (FLONASE) 50 MCG/ACT nasal spray Place 1 spray into both nostrils daily for 14 days. 03/05/20 04/18/20  Avegno, Zachery Dakins, FNP  furosemide (LASIX) 20 MG tablet Take 1 tablet (20 mg total) by mouth daily. 05/06/18   Alwyn Ren, MD  hydrOXYzine (VISTARIL) 50 MG capsule Take 1 capsule by mouth daily.    [provider]  loperamide (IMODIUM) 2 MG capsule Take 4 mg by mouth as needed for diarrhea or loose stools.    [provider]  LORazepam (ATIVAN) 1 MG tablet Take 1 tablet by mouth at bedtime as needed. Take a 1/2 to 1 tablet by mouth at bedtime.    [provider]  metoprolol tartrate (LOPRESSOR) 100 MG tablet Take  1 tablet (100 mg total) by mouth 2 (two) times daily. 04/25/18   Hongalgi, Maximino Greenland, MD  ofloxacin (OCUFLOX) 0.3 % ophthalmic solution Place 2 drops into the right ear daily as needed (for pain). 2 drops in right ear daily prn. 01/14/14   [provider]  oxyCODONE-acetaminophen (PERCOCET) 10-325 MG tablet Take 1 tablet by mouth every 6 (six) hours as needed. 04/01/20   [provider]  potassium chloride (K-DUR,KLOR-CON) 10 MEQ tablet Take 1 tablet (10 mEq total) by mouth daily. 05/06/18   Alwyn Ren, MD  umeclidinium-vilanterol [facility] ELLIPTA) 62.5-25 MCG/INH AEPB Inhale 1  puff into the lungs daily. 04/25/18   Hongalgi, Maximino Greenland, MD      Allergies    Adhesive [tape], Doxycycline, and Latex    Review of Systems   Review of Systems  Unable to perform ROS: Dementia    Physical Exam Updated Vital Signs BP (!) 146/82 (BP Location: Left Arm)   Pulse 69   Temp 97.8 F (36.6 C) (Oral)   Resp 18   SpO2 95%  Physical Exam Vitals and nursing note reviewed.  Constitutional:      Appearance: Normal appearance.  HENT:     Head: Normocephalic and atraumatic.  Eyes:     Conjunctiva/sclera: Conjunctivae normal.  Neck:     Comments: No grimace to palpation of midline spine, no step-offs or crepitus Cardiovascular:     Rate and Rhythm: Normal rate and regular rhythm.     Pulses:          Posterior tibial pulses are 1+ on the right side and 1+ on the left side.  Pulmonary:     Effort: Pulmonary effort is normal. No respiratory distress.     Breath sounds: Normal breath sounds.  Chest:     Comments: Chest wall stable, no grimace to palpation Abdominal:     General: There is no distension.     Palpations: Abdomen is soft.     Tenderness: There is no abdominal tenderness.  Musculoskeletal:     Comments: Pelvis and extremity stable, no grimace to palpation  Skin:    General: Skin is warm and dry.  Neurological:     Mental Status: She is alert. Mental status is at baseline.     Comments: Follows basic commands Does not answer questions, mumbling speech     ED Results / Procedures / Treatments   Labs (all labs ordered are listed, but only abnormal results are displayed) Labs Reviewed  COMPREHENSIVE METABOLIC PANEL - Abnormal; Notable for the following components:      Result Value   Glucose, Bld 102 (*)    Creatinine, Ser 1.13 (*)    GFR, Estimated 49 (*)    All other components within normal limits  URINALYSIS, ROUTINE W REFLEX MICROSCOPIC - Abnormal; Notable for the following components:   Color, Urine STRAW (*)    All other components within  normal limits  CBC    EKG EKG Interpretation Date/Time:  Saturday July 26 2023 15:16:56 EST Ventricular Rate:  75 PR Interval:  151 QRS Duration:  93 QT Interval:  434 QTC Calculation: 485 R Axis:   52  Text Interpretation: Sinus rhythm Confirmed by Eber Hong ([id number]) on 07/26/2023 3:23:13 PM  Radiology CT CHEST ABDOMEN PELVIS W CONTRAST Result Date: 07/26/2023 CLINICAL DATA:  Unwitnessed fall, possible pneumothorax by chest radiographs EXAM: CT CHEST, ABDOMEN, AND PELVIS WITH CONTRAST CT THORACIC AND LUMBAR SPINE WITH CONTRAST TECHNIQUE: Multidetector CT imaging of the  chest, abdomen and pelvis was performed following the standard protocol during bolus administration of intravenous contrast. Multidetector CT imaging of the thoracic and lumbar spine was performed following the standard protocol during bolus administration of intravenous contrast. RADIATION DOSE REDUCTION: This exam was performed according to the departmental dose-optimization program which includes automated exposure control, adjustment of the mA and/or kV according to patient size and/or use of iterative reconstruction technique. CONTRAST:  OMNIPAQUE IOHEXOL 300 MG/ML  SOLN COMPARISON:  CT abdomen pelvis, 04/18/2020, CT chest angiogram, 05/03/2018 FINDINGS: CT CHEST FINDINGS Cardiovascular: Aortic atherosclerosis. Aortic valve calcifications. Normal heart size. Three-vessel coronary artery calcifications. No pericardial effusion. Mediastinum/Nodes: No enlarged mediastinal, hilar, or axillary lymph nodes. Thyroid gland, trachea, and esophagus demonstrate no significant findings. Lungs/Pleura: Mild bibasilar scarring or atelectasis. No pleural effusion or pneumothorax. Musculoskeletal: No chest wall mass or suspicious osseous lesions identified. CT ABDOMEN PELVIS FINDINGS Hepatobiliary: Unchanged heterogeneously calcified lesions with capsular retraction of the inferior right lobe of the liver, likely involuted  hemangiomata (series 9, image 27). Status post cholecystectomy. Unchanged postoperative biliary ductal dilatation. Pancreas: Unremarkable. No pancreatic ductal dilatation or surrounding inflammatory changes. Spleen: Normal in size without significant abnormality. Adrenals/Urinary Tract: Adrenal glands are unremarkable. Small nonobstructive bilateral renal calculi. No ureteral calculi or hydronephrosis. Bladder is unremarkable. Stomach/Bowel: Stomach is within normal limits. Appendix appears normal. No evidence of bowel wall thickening, distention, or inflammatory changes. Vascular/Lymphatic: Aortic atherosclerosis. No enlarged abdominal or pelvic lymph nodes. Reproductive: Status post hysterectomy. Other: No abdominal wall hernia or abnormality. No ascites. Musculoskeletal: No acute osseous findings. Status post cement augmentation of the sacral ala. CT THORACIC AND LUMBAR SPINE FINDINGS Alignment: S shaped scoliosis of the lumbar spine with severe associated disc degenerative disease. Otherwise normal thoracic kyphosis. Degenerative and scoliotic straightening of the normal lumbar lordosis. Vertebral bodies: Intact. No fracture or dislocation. Disc spaces: Severe disc space height loss and osteophytosis throughout the lumbar spine, with mild multilevel disc degenerative disease of the thoracic spine. Paraspinous soft tissues: Unremarkable. IMPRESSION: 1. No CT evidence of acute traumatic injury to the chest, abdomen, or pelvis. 2. Specifically, no evidence of pneumothorax queried by prior radiographs. 3. No fracture or dislocation of the thoracic or lumbar spine. 4. S shaped scoliosis of the lumbar spine with severe associated disc degenerative disease. 5. Nonobstructive bilateral nephrolithiasis. 6. Coronary artery disease. Aortic Atherosclerosis (ICD10-I70.0). Electronically Signed   By: Jearld Lesch M.D.   On: 07/26/2023 16:03   CT T-SPINE NO CHARGE Result Date: 07/26/2023 CLINICAL DATA:  Unwitnessed fall,  possible pneumothorax by chest radiographs EXAM: CT CHEST, ABDOMEN, AND PELVIS WITH CONTRAST CT THORACIC AND LUMBAR SPINE WITH CONTRAST TECHNIQUE: Multidetector CT imaging of the chest, abdomen and pelvis was performed following the standard protocol during bolus administration of intravenous contrast. Multidetector CT imaging of the thoracic and lumbar spine was performed following the standard protocol during bolus administration of intravenous contrast. RADIATION DOSE REDUCTION: This exam was performed according to the departmental dose-optimization program which includes automated exposure control, adjustment of the mA and/or kV according to patient size and/or use of iterative reconstruction technique. CONTRAST:  OMNIPAQUE IOHEXOL 300 MG/ML  SOLN COMPARISON:  CT abdomen pelvis, 04/18/2020, CT chest angiogram, 05/03/2018 FINDINGS: CT CHEST FINDINGS Cardiovascular: Aortic atherosclerosis. Aortic valve calcifications. Normal heart size. Three-vessel coronary artery calcifications. No pericardial effusion. Mediastinum/Nodes: No enlarged mediastinal, hilar, or axillary lymph nodes. Thyroid gland, trachea, and esophagus demonstrate no significant findings. Lungs/Pleura: Mild bibasilar scarring or atelectasis. No pleural effusion or pneumothorax. Musculoskeletal: No  chest wall mass or suspicious osseous lesions identified. CT ABDOMEN PELVIS FINDINGS Hepatobiliary: Unchanged heterogeneously calcified lesions with capsular retraction of the inferior right lobe of the liver, likely involuted hemangiomata (series 9, image 27). Status post cholecystectomy. Unchanged postoperative biliary ductal dilatation. Pancreas: Unremarkable. No pancreatic ductal dilatation or surrounding inflammatory changes. Spleen: Normal in size without significant abnormality. Adrenals/Urinary Tract: Adrenal glands are unremarkable. Small nonobstructive bilateral renal calculi. No ureteral calculi or hydronephrosis. Bladder is unremarkable.  Stomach/Bowel: Stomach is within normal limits. Appendix appears normal. No evidence of bowel wall thickening, distention, or inflammatory changes. Vascular/Lymphatic: Aortic atherosclerosis. No enlarged abdominal or pelvic lymph nodes. Reproductive: Status post hysterectomy. Other: No abdominal wall hernia or abnormality. No ascites. Musculoskeletal: No acute osseous findings. Status post cement augmentation of the sacral ala. CT THORACIC AND LUMBAR SPINE FINDINGS Alignment: S shaped scoliosis of the lumbar spine with severe associated disc degenerative disease. Otherwise normal thoracic kyphosis. Degenerative and scoliotic straightening of the normal lumbar lordosis. Vertebral bodies: Intact. No fracture or dislocation. Disc spaces: Severe disc space height loss and osteophytosis throughout the lumbar spine, with mild multilevel disc degenerative disease of the thoracic spine. Paraspinous soft tissues: Unremarkable. IMPRESSION: 1. No CT evidence of acute traumatic injury to the chest, abdomen, or pelvis. 2. Specifically, no evidence of pneumothorax queried by prior radiographs. 3. No fracture or dislocation of the thoracic or lumbar spine. 4. S shaped scoliosis of the lumbar spine with severe associated disc degenerative disease. 5. Nonobstructive bilateral nephrolithiasis. 6. Coronary artery disease. Aortic Atherosclerosis (ICD10-I70.0). Electronically Signed   By: Jearld Lesch M.D.   On: 07/26/2023 16:03   CT L-SPINE NO CHARGE Result Date: 07/26/2023 CLINICAL DATA:  Unwitnessed fall, possible pneumothorax by chest radiographs EXAM: CT CHEST, ABDOMEN, AND PELVIS WITH CONTRAST CT THORACIC AND LUMBAR SPINE WITH CONTRAST TECHNIQUE: Multidetector CT imaging of the chest, abdomen and pelvis was performed following the standard protocol during bolus administration of intravenous contrast. Multidetector CT imaging of the thoracic and lumbar spine was performed following the standard protocol during bolus  administration of intravenous contrast. RADIATION DOSE REDUCTION: This exam was performed according to the departmental dose-optimization program which includes automated exposure control, adjustment of the mA and/or kV according to patient size and/or use of iterative reconstruction technique. CONTRAST:  OMNIPAQUE IOHEXOL 300 MG/ML  SOLN COMPARISON:  CT abdomen pelvis, 04/18/2020, CT chest angiogram, 05/03/2018 FINDINGS: CT CHEST FINDINGS Cardiovascular: Aortic atherosclerosis. Aortic valve calcifications. Normal heart size. Three-vessel coronary artery calcifications. No pericardial effusion. Mediastinum/Nodes: No enlarged mediastinal, hilar, or axillary lymph nodes. Thyroid gland, trachea, and esophagus demonstrate no significant findings. Lungs/Pleura: Mild bibasilar scarring or atelectasis. No pleural effusion or pneumothorax. Musculoskeletal: No chest wall mass or suspicious osseous lesions identified. CT ABDOMEN PELVIS FINDINGS Hepatobiliary: Unchanged heterogeneously calcified lesions with capsular retraction of the inferior right lobe of the liver, likely involuted hemangiomata (series 9, image 27). Status post cholecystectomy. Unchanged postoperative biliary ductal dilatation. Pancreas: Unremarkable. No pancreatic ductal dilatation or surrounding inflammatory changes. Spleen: Normal in size without significant abnormality. Adrenals/Urinary Tract: Adrenal glands are unremarkable. Small nonobstructive bilateral renal calculi. No ureteral calculi or hydronephrosis. Bladder is unremarkable. Stomach/Bowel: Stomach is within normal limits. Appendix appears normal. No evidence of bowel wall thickening, distention, or inflammatory changes. Vascular/Lymphatic: Aortic atherosclerosis. No enlarged abdominal or pelvic lymph nodes. Reproductive: Status post hysterectomy. Other: No abdominal wall hernia or abnormality. No ascites. Musculoskeletal: No acute osseous findings. Status post cement augmentation of the  sacral ala. CT THORACIC AND LUMBAR SPINE FINDINGS  Alignment: S shaped scoliosis of the lumbar spine with severe associated disc degenerative disease. Otherwise normal thoracic kyphosis. Degenerative and scoliotic straightening of the normal lumbar lordosis. Vertebral bodies: Intact. No fracture or dislocation. Disc spaces: Severe disc space height loss and osteophytosis throughout the lumbar spine, with mild multilevel disc degenerative disease of the thoracic spine. Paraspinous soft tissues: Unremarkable. IMPRESSION: 1. No CT evidence of acute traumatic injury to the chest, abdomen, or pelvis. 2. Specifically, no evidence of pneumothorax queried by prior radiographs. 3. No fracture or dislocation of the thoracic or lumbar spine. 4. S shaped scoliosis of the lumbar spine with severe associated disc degenerative disease. 5. Nonobstructive bilateral nephrolithiasis. 6. Coronary artery disease. Aortic Atherosclerosis (ICD10-I70.0). Electronically Signed   By: Jearld Lesch M.D.   On: 07/26/2023 16:03   DG Chest Portable 1 View Result Date: 07/26/2023 CLINICAL DATA:  Unwitnessed fall, unsure of loss of consciousness. EXAM: PORTABLE CHEST 1 VIEW COMPARISON:  04/18/2020 and CT chest 05/03/2018. FINDINGS: Trachea is midline. Heart size stable. Thoracic aorta is calcified and uncoiled. Minimal streaky scarring in the medial lung bases. No airspace consolidation. Possible apical pleural edge on the left, suggesting a tiny left apical pneumothorax. Osteopenia.  Osseous structures appear grossly intact. IMPRESSION: 1. Possible trace left apical pneumothorax. Critical Value/emergent results were called by telephone at the time of interpretation on 07/26/2023 at 12:28 pm to provider [facility] RAY, who verbally acknowledged these results. 2. Otherwise, no acute findings. Electronically Signed   By: Leanna Battles M.D.   On: 07/26/2023 12:29   CT Head Wo Contrast Result Date: 07/26/2023 CLINICAL DATA:  Minor head trauma. EXAM:  CT HEAD WITHOUT CONTRAST TECHNIQUE: Contiguous axial images were obtained from the base of the skull through the vertex without intravenous contrast. RADIATION DOSE REDUCTION: This exam was performed according to the departmental dose-optimization program which includes automated exposure control, adjustment of the mA and/or kV according to patient size and/or use of iterative reconstruction technique. COMPARISON:  04/23/2018 FINDINGS: Brain: Mixed density subdural hematomas along the left more than right cerebral convexity. Both contain high-density components suggesting recent blood products. The collection on the left measures up to 13 mm in thickness and causes mild frontal mass effect. The right collection measures up to 9 mm on coronal reformats. Chronic small vessel ischemic gliosis in the cerebral white matter. No evidence of acute infarct, hydrocephalus, or mass lesion. Vascular: No hyperdense vessel or unexpected calcification. Skull: Normal. Negative for fracture or focal lesion. Sinuses/Orbits: Partial bilateral mastoid opacification with negative nasopharynx. Negative sinuses. Critical Value/emergent results were called by telephone at the time of interpretation on 07/26/2023 at 12:26 pm to provider [facility] Jasman Pfeifle , who verbally acknowledged these results. IMPRESSION: Mixed density subdural hematoma along the bilateral cerebral convexity, up to 13 mm thickness on the left and 9 mm thickness on the right. Left frontal mass effect is mild given underlying atrophy. Electronically Signed   By: Tiburcio Pea M.D.   On: 07/26/2023 12:27   DG Pelvis Portable Result Date: 07/26/2023 CLINICAL DATA:  Unwitnessed fall. EXAM: PORTABLE PELVIS 1-2 VIEWS COMPARISON:  11/30/2008. FINDINGS: Osteopenia. No definite fracture. No dislocation. Sacroiliac joints are patent. Calcified uterine fibroid. Degenerative changes in the spine. Mild medial joint space loss in the right hip. IMPRESSION: 1. No acute fracture or  dislocation. 2. Osteopenia. 3. Mild right hip osteoarthritis. 4. Lumbar spondylosis. Electronically Signed   By: Leanna Battles M.D.   On: 07/26/2023 12:23   CT Cervical Spine Wo Contrast Result Date:  07/26/2023 CLINICAL DATA:  Fall.  Neck trauma (Age >= 65y) EXAM: CT CERVICAL SPINE WITHOUT CONTRAST TECHNIQUE: Multidetector CT imaging of the cervical spine was performed without intravenous contrast. Multiplanar CT image reconstructions were also generated. RADIATION DOSE REDUCTION: This exam was performed according to the departmental dose-optimization program which includes automated exposure control, adjustment of the mA and/or kV according to patient size and/or use of iterative reconstruction technique. COMPARISON:  11/19/2016 FINDINGS: Alignment: Facet joints are aligned without dislocation or traumatic listhesis. Dens and lateral masses are aligned. Grade 2 anterolisthesis of C6 on C7. Grade 1 anterolisthesis of C4-5, C5-6, C7-T1, and T1-T2. Cervical dextrocurvature. Skull base and vertebrae: No acute fracture. No primary bone lesion or focal pathologic process. Soft tissues and spinal canal: No prevertebral fluid or swelling. No visible canal hematoma. Disc levels: Severe degenerative disc disease of C6-7 with sclerosis and cystic changes of the endplates. Severe multilevel left-sided cervical facet joint arthropathy. Upper chest: Emphysema. Other: Bilateral carotid atherosclerosis. IMPRESSION: 1. No acute fracture or traumatic listhesis of the cervical spine. 2. Severe degenerative disc disease of C6-7. Severe multilevel left-sided cervical facet joint arthropathy. 3. Emphysema (ICD10-J43.9). Electronically Signed   By: Duanne Guess D.O.   On: 07/26/2023 12:19    Procedures Procedures    Medications Ordered in ED Medications  0.9 %  sodium chloride infusion ( Intravenous New Bag/Given 07/26/23 1627)  iohexol (OMNIPAQUE) 300 MG/ML solution 100 mL (100 mLs Intravenous Contrast Given 07/26/23 1530)     ED Course/ Medical Decision Making/ A&P Clinical Course as of 07/26/23 1653  Sat Jul 26, 2023  1650 Updated son Nida Boatman) via phone about patient's results. He is aware she has previously had brain bleeds on imaging. Is agreement about having her follow up with neurosurgery outpatient.  [LR]    Clinical Course User Index [LR] Babita Amaker, Lora Paula, PA-C                                 Medical Decision Making Amount and/or Complexity of Data Reviewed Labs: ordered. Radiology: ordered.   This patient is a 83 y.o. female  who presents to the ED for concern of fall.   Differential diagnoses prior to evaluation: The emergent differential diagnosis includes, but is not limited to, bony trauma, internal bleeding, syncope, sepsis. This is not an exhaustive differential.   Past Medical History / Co-morbidities / Social History: stasis edema, chronic bronchitis, hypertension, hyperlipidemia, pancreatitis, NSTEMI, IBS, CAD, GERD, dementia  Additional history: Chart reviewed. Pertinent results include: Reviewed nursing home records at bedside  Physical Exam: Physical exam performed. The pertinent findings include: Mildly hypertensive, otherwise normal vital signs.  Resting comfortably in exam bed.  At neurologic baseline, with some mumbling speech, not answering questions, but following basic commands.  No grimace during head to toe trauma exam.  Lab Tests/Imaging studies: I personally interpreted labs/imaging and the pertinent results include: CBC normal.  CMP with mildly elevated creatinine, GFR 49.  UA unremarkable.  CT head shows mixed density subdural hematoma along bilateral cerebral convexity, with some left frontal mass effect.  CT cervical spine with severe degenerative disc disease, no traumatic findings.  Chest x-ray with possible trace left apical pneumothorax.  Pelvis x-ray unremarkable.  CT chest/abdomen/pelvis shows no CT evidence of traumatic injury to chest, abdomen, or  pelvis.  I agree with the radiologist interpretation.  Cardiac monitoring: EKG obtained and interpreted by myself and attending physician which shows: Sinus  rhythm   Medications: I ordered medication including gentle IVF.  I have reviewed the patients home medicines and have made adjustments as needed.  Consultations obtained: I consulted with neurosurgery PA Esperanza Richters reviewed case with attending and recommends: following up with neurosurgery outpatient. They feel the bleeds appear very old and do not require emergent treatment.    Disposition: After consideration of the diagnostic results and the patients response to treatment, I feel that emergency department workup does not suggest an emergent condition requiring admission or immediate intervention beyond what has been performed at this time. The plan is: discharge back to facility. Given neurosurgery follow up information for facility. The patient is safe for discharge and has been instructed to return immediately for worsening symptoms, change in symptoms or any other concerns.  I discussed this case with my attending physician Dr. Hyacinth Meeker who cosigned this note including patient's presenting symptoms, physical exam, and planned diagnostics and interventions. Attending physician stated agreement with plan or made changes to plan which were implemented.   Final Clinical Impression(s) / ED Diagnoses Final diagnoses:  Fall, initial encounter  Chronic subdural hematoma (HCC)    Rx / DC Orders ED Discharge Orders     None      Portions of this report may have been transcribed using voice recognition software. Every effort was made to ensure accuracy; however, inadvertent computerized transcription errors may be present.    Jeanella Flattery 07/26/23 1653    Eber Hong, MD 07/27/23 1754

## 2023-07-26 NOTE — Discharge Instructions (Addendum)
Josefita was seen in the ER after a fall.  Her blood work and urine sample looked reassuring, there was no evidence of UTI.  The CT scan of her head did show an old brain bleed.  We reviewed this with the neurosurgeons on-call, and they feel that this is likely not new, but they would like to see her in follow-up in their clinic.  I have attached their contact information to make a follow-up appointment.  The rest of her CT scans looked normal.  I updated her son via phone.

## 2023-07-28 DIAGNOSIS — G894 Chronic pain syndrome: Secondary | ICD-10-CM | POA: Diagnosis not present

## 2023-07-28 DIAGNOSIS — Z87820 Personal history of traumatic brain injury: Secondary | ICD-10-CM | POA: Diagnosis not present

## 2023-07-28 DIAGNOSIS — Z79891 Long term (current) use of opiate analgesic: Secondary | ICD-10-CM | POA: Diagnosis not present

## 2023-07-28 DIAGNOSIS — Z9181 History of falling: Secondary | ICD-10-CM | POA: Diagnosis not present

## 2023-07-29 DIAGNOSIS — M542 Cervicalgia: Secondary | ICD-10-CM | POA: Diagnosis not present

## 2023-07-29 DIAGNOSIS — S065XAA Traumatic subdural hemorrhage with loss of consciousness status unknown, initial encounter: Secondary | ICD-10-CM | POA: Diagnosis not present

## 2023-07-29 DIAGNOSIS — F331 Major depressive disorder, recurrent, moderate: Secondary | ICD-10-CM | POA: Diagnosis not present

## 2023-08-01 DIAGNOSIS — M810 Age-related osteoporosis without current pathological fracture: Secondary | ICD-10-CM | POA: Diagnosis not present

## 2023-08-01 DIAGNOSIS — F1120 Opioid dependence, uncomplicated: Secondary | ICD-10-CM | POA: Diagnosis not present

## 2023-08-01 DIAGNOSIS — J449 Chronic obstructive pulmonary disease, unspecified: Secondary | ICD-10-CM | POA: Diagnosis not present

## 2023-08-01 DIAGNOSIS — F331 Major depressive disorder, recurrent, moderate: Secondary | ICD-10-CM | POA: Diagnosis not present

## 2023-08-01 DIAGNOSIS — M6281 Muscle weakness (generalized): Secondary | ICD-10-CM | POA: Diagnosis not present

## 2023-08-01 DIAGNOSIS — I10 Essential (primary) hypertension: Secondary | ICD-10-CM | POA: Diagnosis not present

## 2023-08-05 DIAGNOSIS — F331 Major depressive disorder, recurrent, moderate: Secondary | ICD-10-CM | POA: Diagnosis not present

## 2023-08-12 DIAGNOSIS — F331 Major depressive disorder, recurrent, moderate: Secondary | ICD-10-CM | POA: Diagnosis not present

## 2023-08-12 DIAGNOSIS — I119 Hypertensive heart disease without heart failure: Secondary | ICD-10-CM | POA: Diagnosis not present

## 2023-08-12 DIAGNOSIS — E782 Mixed hyperlipidemia: Secondary | ICD-10-CM | POA: Diagnosis not present

## 2023-08-12 DIAGNOSIS — F02B0 Dementia in other diseases classified elsewhere, moderate, without behavioral disturbance, psychotic disturbance, mood disturbance, and anxiety: Secondary | ICD-10-CM | POA: Diagnosis not present

## 2023-08-22 DIAGNOSIS — I119 Hypertensive heart disease without heart failure: Secondary | ICD-10-CM | POA: Diagnosis not present

## 2023-08-22 DIAGNOSIS — F331 Major depressive disorder, recurrent, moderate: Secondary | ICD-10-CM | POA: Diagnosis not present

## 2023-08-25 ENCOUNTER — Other Ambulatory Visit (HOSPITAL_COMMUNITY): Payer: Self-pay | Admitting: Student

## 2023-08-25 DIAGNOSIS — G894 Chronic pain syndrome: Secondary | ICD-10-CM | POA: Diagnosis not present

## 2023-08-25 DIAGNOSIS — M542 Cervicalgia: Secondary | ICD-10-CM

## 2023-08-26 DIAGNOSIS — R443 Hallucinations, unspecified: Secondary | ICD-10-CM | POA: Diagnosis not present

## 2023-08-26 DIAGNOSIS — F331 Major depressive disorder, recurrent, moderate: Secondary | ICD-10-CM | POA: Diagnosis not present

## 2023-09-08 ENCOUNTER — Inpatient Hospital Stay (HOSPITAL_COMMUNITY)
Admission: EM | Admit: 2023-09-08 | Discharge: 2023-09-12 | DRG: 291 | Disposition: A | Discharge status: Skilled Nursing Facility | Source: Skilled Nursing Facility | Attending: Internal Medicine | Admitting: Internal Medicine

## 2023-09-08 ENCOUNTER — Emergency Department (HOSPITAL_COMMUNITY)

## 2023-09-08 ENCOUNTER — Other Ambulatory Visit: Payer: Self-pay

## 2023-09-08 ENCOUNTER — Encounter (HOSPITAL_COMMUNITY): Payer: Self-pay | Admitting: Emergency Medicine

## 2023-09-08 DIAGNOSIS — I10 Essential (primary) hypertension: Secondary | ICD-10-CM | POA: Diagnosis not present

## 2023-09-08 DIAGNOSIS — J4489 Other specified chronic obstructive pulmonary disease: Secondary | ICD-10-CM | POA: Diagnosis present

## 2023-09-08 DIAGNOSIS — I2510 Atherosclerotic heart disease of native coronary artery without angina pectoris: Secondary | ICD-10-CM | POA: Diagnosis present

## 2023-09-08 DIAGNOSIS — I5033 Acute on chronic diastolic (congestive) heart failure: Secondary | ICD-10-CM | POA: Diagnosis present

## 2023-09-08 DIAGNOSIS — Z809 Family history of malignant neoplasm, unspecified: Secondary | ICD-10-CM

## 2023-09-08 DIAGNOSIS — Z7982 Long term (current) use of aspirin: Secondary | ICD-10-CM | POA: Diagnosis not present

## 2023-09-08 DIAGNOSIS — J9811 Atelectasis: Secondary | ICD-10-CM | POA: Diagnosis not present

## 2023-09-08 DIAGNOSIS — E876 Hypokalemia: Secondary | ICD-10-CM | POA: Diagnosis present

## 2023-09-08 DIAGNOSIS — R0689 Other abnormalities of breathing: Secondary | ICD-10-CM | POA: Diagnosis not present

## 2023-09-08 DIAGNOSIS — Z87891 Personal history of nicotine dependence: Secondary | ICD-10-CM | POA: Diagnosis not present

## 2023-09-08 DIAGNOSIS — E559 Vitamin D deficiency, unspecified: Secondary | ICD-10-CM | POA: Diagnosis not present

## 2023-09-08 DIAGNOSIS — I110 Hypertensive heart disease with heart failure: Secondary | ICD-10-CM | POA: Diagnosis present

## 2023-09-08 DIAGNOSIS — Z8249 Family history of ischemic heart disease and other diseases of the circulatory system: Secondary | ICD-10-CM

## 2023-09-08 DIAGNOSIS — F03911 Unspecified dementia, unspecified severity, with agitation: Secondary | ICD-10-CM | POA: Diagnosis present

## 2023-09-08 DIAGNOSIS — Z860100 Personal history of colon polyps, unspecified: Secondary | ICD-10-CM | POA: Diagnosis not present

## 2023-09-08 DIAGNOSIS — Z8673 Personal history of transient ischemic attack (TIA), and cerebral infarction without residual deficits: Secondary | ICD-10-CM

## 2023-09-08 DIAGNOSIS — Z79899 Other long term (current) drug therapy: Secondary | ICD-10-CM | POA: Diagnosis not present

## 2023-09-08 DIAGNOSIS — Z66 Do not resuscitate: Secondary | ICD-10-CM | POA: Diagnosis present

## 2023-09-08 DIAGNOSIS — E7800 Pure hypercholesterolemia, unspecified: Secondary | ICD-10-CM | POA: Diagnosis present

## 2023-09-08 DIAGNOSIS — Z91040 Latex allergy status: Secondary | ICD-10-CM | POA: Diagnosis not present

## 2023-09-08 DIAGNOSIS — Z800 Family history of malignant neoplasm of digestive organs: Secondary | ICD-10-CM | POA: Diagnosis not present

## 2023-09-08 DIAGNOSIS — J90 Pleural effusion, not elsewhere classified: Secondary | ICD-10-CM | POA: Diagnosis not present

## 2023-09-08 DIAGNOSIS — R918 Other nonspecific abnormal finding of lung field: Secondary | ICD-10-CM | POA: Diagnosis not present

## 2023-09-08 DIAGNOSIS — F32A Depression, unspecified: Secondary | ICD-10-CM | POA: Diagnosis present

## 2023-09-08 DIAGNOSIS — Z1152 Encounter for screening for COVID-19: Secondary | ICD-10-CM

## 2023-09-08 DIAGNOSIS — Z881 Allergy status to other antibiotic agents status: Secondary | ICD-10-CM | POA: Diagnosis not present

## 2023-09-08 DIAGNOSIS — R0602 Shortness of breath: Secondary | ICD-10-CM | POA: Diagnosis not present

## 2023-09-08 DIAGNOSIS — R0902 Hypoxemia: Secondary | ICD-10-CM | POA: Diagnosis not present

## 2023-09-08 DIAGNOSIS — Z91048 Other nonmedicinal substance allergy status: Secondary | ICD-10-CM | POA: Diagnosis not present

## 2023-09-08 DIAGNOSIS — J9601 Acute respiratory failure with hypoxia: Secondary | ICD-10-CM | POA: Diagnosis present

## 2023-09-08 DIAGNOSIS — I509 Heart failure, unspecified: Secondary | ICD-10-CM | POA: Diagnosis not present

## 2023-09-08 DIAGNOSIS — J449 Chronic obstructive pulmonary disease, unspecified: Secondary | ICD-10-CM | POA: Diagnosis not present

## 2023-09-08 DIAGNOSIS — Z7902 Long term (current) use of antithrombotics/antiplatelets: Secondary | ICD-10-CM | POA: Diagnosis not present

## 2023-09-08 LAB — CBC WITH DIFFERENTIAL/PLATELET
Abs Immature Granulocytes: 0.03 10*3/uL (ref 0.00–0.07)
Basophils Absolute: 0 10*3/uL (ref 0.0–0.1)
Basophils Relative: 0 %
Eosinophils Absolute: 0.4 10*3/uL (ref 0.0–0.5)
Eosinophils Relative: 4 %
HCT: 34.7 % — ABNORMAL LOW (ref 36.0–46.0)
Hemoglobin: 11.1 g/dL — ABNORMAL LOW (ref 12.0–15.0)
Immature Granulocytes: 0 %
Lymphocytes Relative: 12 %
Lymphs Abs: 1.3 10*3/uL (ref 0.7–4.0)
MCH: 31.5 pg (ref 26.0–34.0)
MCHC: 32 g/dL (ref 30.0–36.0)
MCV: 98.6 fL (ref 80.0–100.0)
Monocytes Absolute: 1.2 10*3/uL — ABNORMAL HIGH (ref 0.1–1.0)
Monocytes Relative: 11 %
Neutro Abs: 8.1 10*3/uL — ABNORMAL HIGH (ref 1.7–7.7)
Neutrophils Relative %: 73 %
Platelets: 257 10*3/uL (ref 150–400)
RBC: 3.52 MIL/uL — ABNORMAL LOW (ref 3.87–5.11)
RDW: 13.6 % (ref 11.5–15.5)
WBC: 10.9 10*3/uL — ABNORMAL HIGH (ref 4.0–10.5)
nRBC: 0 % (ref 0.0–0.2)

## 2023-09-08 LAB — PROTIME-INR
INR: 1 (ref 0.8–1.2)
Prothrombin Time: 13 s (ref 11.4–15.2)

## 2023-09-08 LAB — COMPREHENSIVE METABOLIC PANEL
ALT: 11 U/L (ref 0–44)
AST: 17 U/L (ref 15–41)
Albumin: 3.3 g/dL — ABNORMAL LOW (ref 3.5–5.0)
Alkaline Phosphatase: 95 U/L (ref 38–126)
Anion gap: 9 (ref 5–15)
BUN: 26 mg/dL — ABNORMAL HIGH (ref 8–23)
CO2: 27 mmol/L (ref 22–32)
Calcium: 9.4 mg/dL (ref 8.9–10.3)
Chloride: 102 mmol/L (ref 98–111)
Creatinine, Ser: 0.94 mg/dL (ref 0.44–1.00)
GFR, Estimated: >60 mL/min (ref >60)
Glucose, Bld: 116 mg/dL — ABNORMAL HIGH (ref 70–99)
Potassium: 4.4 mmol/L (ref 3.5–5.1)
Sodium: 138 mmol/L (ref 135–145)
Total Bilirubin: 0.7 mg/dL (ref 0.0–1.2)
Total Protein: 6.9 g/dL (ref 6.5–8.1)

## 2023-09-08 LAB — APTT: aPTT: 34 s (ref 24–36)

## 2023-09-08 LAB — LACTIC ACID, PLASMA: Lactic Acid, Venous: 0.8 mmol/L (ref 0.5–1.9)

## 2023-09-08 LAB — RESP PANEL BY RT-PCR (RSV, FLU A&B, COVID)  RVPGX2
Influenza A by PCR: NEGATIVE
Influenza B by PCR: NEGATIVE
Resp Syncytial Virus by PCR: NEGATIVE
SARS Coronavirus 2 by RT PCR: NEGATIVE

## 2023-09-08 MED ORDER — SODIUM CHLORIDE 0.9 % IV SOLN
500.0000 mg | Freq: Once | INTRAVENOUS | Status: AC
  Administered 2023-09-08: 500 mg via INTRAVENOUS
  Filled 2023-09-08: qty 5

## 2023-09-08 MED ORDER — IPRATROPIUM-ALBUTEROL 0.5-2.5 (3) MG/3ML IN SOLN
3.0000 mL | Freq: Once | RESPIRATORY_TRACT | Status: AC
  Administered 2023-09-08: 3 mL via RESPIRATORY_TRACT

## 2023-09-08 MED ORDER — SODIUM CHLORIDE 0.9 % IV SOLN
2.0000 g | Freq: Once | INTRAVENOUS | Status: AC
  Administered 2023-09-08: 2 g via INTRAVENOUS
  Filled 2023-09-08: qty 20

## 2023-09-08 NOTE — Sepsis Progress Note (Signed)
 Following for sepsis monitoring ?

## 2023-09-08 NOTE — ED Provider Notes (Signed)
 [facility] DEPARTMENT AT [facility] Provider Note   CSN: [id number] Arrival date & time: 09/08/23  2129     History {Add pertinent medical, surgical, social history, OB history to HPI:1} Chief Complaint  Patient presents with   Shortness of Breath    Connie Wood is a 83 y.o. female.   Shortness of Breath  This patient is an 83 year old female coming from [facility] living, she has a history of some renal insufficiency, she has a cognitive impairment, essential hypertension COPD hyperlipidemia.  Takes medications including Aricept, Lasix, Lipitor, metoprolol, Plavix, Seroquel, Effexor, Vicodin, hydroxyzine with allergies to doxycycline.  Evidently the patient was having a visual hard time breathing tonight, the patient did not have any complaints, there was no reports of fevers or vomiting.  She had an oxygen of 89% when EMS arrived so she was sent to the hospital.    Home Medications Prior to Admission medications   Medication Sig Start Date End Date Taking? Authorizing Provider  acetaminophen (TYLENOL) 500 MG tablet Take 2 tablets (1,000 mg total) by mouth every 8 (eight) hours as needed for mild pain, moderate pain or headache. 04/25/18   Hongalgi, Maximino Greenland, MD  albuterol (PROVENTIL HFA;VENTOLIN HFA) 108 (90 Base) MCG/ACT inhaler Inhale 2 puffs into the lungs every 6 (six) hours as needed for wheezing or shortness of breath. 04/25/18   Hongalgi, Maximino Greenland, MD  aspirin 81 MG chewable tablet Chew 1 tablet (81 mg total) by mouth daily. 04/25/18   Hongalgi, Maximino Greenland, MD  atorvastatin (LIPITOR) 80 MG tablet Take 1 tablet (80 mg total) by mouth daily at 6 PM. 04/25/18   Hongalgi, Maximino Greenland, MD  azithromycin (ZITHROMAX) 250 MG tablet Take 2 tablets daily for 3 days 05/06/18   Alwyn Ren, MD  AZOR 10-40 MG per tablet Take 1 tablet by mouth daily. Reported on 12/04/2015 07/06/13   [provider]  benzonatate (TESSALON) 100 MG capsule Take 1  capsule (100 mg total) by mouth every 8 (eight) hours. 03/05/20   Avegno, Zachery Dakins, FNP  Calcium Citrate (CITRACAL PO) Take 1 tablet by mouth daily.     [provider]  Cholecalciferol (VITAMIN D PO) Take 1 tablet by mouth daily.     [provider]  clopidogrel (PLAVIX) 75 MG tablet Take 1 tablet (75 mg total) by mouth daily. 04/26/18   Hongalgi, Maximino Greenland, MD  fluconazole (DIFLUCAN) 150 MG tablet fluconazole 150 mg tablet  TK 1 T PO TODAY THEN TK 1 T PO 3 DAYS LATER    [provider]  fluticasone (FLONASE) 50 MCG/ACT nasal spray Place 1 spray into both nostrils daily for 14 days. 03/05/20 04/18/20  Avegno, Zachery Dakins, FNP  furosemide (LASIX) 20 MG tablet Take 1 tablet (20 mg total) by mouth daily. 05/06/18   Alwyn Ren, MD  hydrOXYzine (VISTARIL) 50 MG capsule Take 1 capsule by mouth daily.    [provider]  loperamide (IMODIUM) 2 MG capsule Take 4 mg by mouth as needed for diarrhea or loose stools.    [provider]  LORazepam (ATIVAN) 1 MG tablet Take 1 tablet by mouth at bedtime as needed. Take a 1/2 to 1 tablet by mouth at bedtime.    [provider]  metoprolol tartrate (LOPRESSOR) 100 MG tablet Take 1 tablet (100 mg total) by mouth 2 (two) times daily. 04/25/18   Hongalgi, Maximino Greenland, MD  ofloxacin (OCUFLOX) 0.3 % ophthalmic solution Place 2 drops into  the right ear daily as needed (for pain). 2 drops in right ear daily prn. 01/14/14   [provider]  oxyCODONE-acetaminophen (PERCOCET) 10-325 MG tablet Take 1 tablet by mouth every 6 (six) hours as needed. 04/01/20   [provider]  potassium chloride (K-DUR,KLOR-CON) 10 MEQ tablet Take 1 tablet (10 mEq total) by mouth daily. 05/06/18   Alwyn Ren, MD  umeclidinium-vilanterol [facility] ELLIPTA) 62.5-25 MCG/INH AEPB Inhale 1 puff into the lungs daily. 04/25/18   Elease Etienne, MD      Allergies    Adhesive [tape], Doxycycline, and Latex    Review of  Systems   Review of Systems  Respiratory:  Positive for shortness of breath.   All other systems reviewed and are negative.   Physical Exam Updated Vital Signs BP (!) 164/87   Pulse 88   Temp 98.6 F (37 C) (Oral)   Resp (!) 24   Ht 1.6 m (5\' 3" )   Wt 57 kg   SpO2 (!) 88%   BMI 22.26 kg/m  Physical Exam Vitals and nursing note reviewed.  Constitutional:      General: She is not in acute distress.    Appearance: She is well-developed.  HENT:     Head: Normocephalic and atraumatic.     Mouth/Throat:     Pharynx: No oropharyngeal exudate.  Eyes:     General: No scleral icterus.       Right eye: No discharge.        Left eye: No discharge.     Conjunctiva/sclera: Conjunctivae normal.     Pupils: Pupils are equal, round, and reactive to light.  Neck:     Thyroid: No thyromegaly.     Vascular: No JVD.  Cardiovascular:     Rate and Rhythm: Normal rate and regular rhythm.     Heart sounds: Normal heart sounds. No murmur heard.    No friction rub. No gallop.  Pulmonary:     Effort: No respiratory distress.     Breath sounds: Normal breath sounds. No wheezing or rales.     Comments: Mild tachypnea, decreased respiratory breath sounds bilaterally, no rales Abdominal:     General: Bowel sounds are normal. There is no distension.     Palpations: Abdomen is soft. There is no mass.     Tenderness: There is no abdominal tenderness.  Musculoskeletal:        General: No tenderness. Normal range of motion.     Cervical back: Normal range of motion and neck supple.     Right lower leg: No edema.     Left lower leg: No edema.  Lymphadenopathy:     Cervical: No cervical adenopathy.  Skin:    General: Skin is warm and dry.     Findings: No erythema or rash.  Neurological:     Mental Status: She is alert.     Coordination: Coordination normal.     Comments: Pleasantly confused, follows simple commands  Psychiatric:        Behavior: Behavior normal.     ED Results /  Procedures / Treatments   Labs (all labs ordered are listed, but only abnormal results are displayed) Labs Reviewed  RESP PANEL BY RT-PCR (RSV, FLU A&B, COVID)  RVPGX2  CULTURE, BLOOD (ROUTINE X 2)  CULTURE, BLOOD (ROUTINE X 2)  LACTIC ACID, PLASMA  LACTIC ACID, PLASMA  COMPREHENSIVE METABOLIC PANEL  CBC WITH DIFFERENTIAL/PLATELET  PROTIME-INR  APTT  URINALYSIS, W/ REFLEX TO CULTURE (INFECTION  SUSPECTED)    EKG EKG Interpretation Date/Time:  Monday September 08 2023 21:40:31 EDT Ventricular Rate:  84 PR Interval:  146 QRS Duration:  93 QT Interval:  420 QTC Calculation: 497 R Axis:   74  Text Interpretation: Sinus rhythm Ventricular premature complex Probable left atrial enlargement Probable anterior infarct, old Nonspecific T abnormalities, lateral leads Confirmed by Eber Hong ([id number]) on 09/08/2023 9:47:18 PM  Radiology No results found.  Procedures Procedures  {Document cardiac monitor, telemetry assessment procedure when appropriate:1}  Medications Ordered in ED Medications  cefTRIAXone (ROCEPHIN) 2 g in sodium chloride 0.9 % 100 mL IVPB (has no administration in time range)  azithromycin (ZITHROMAX) 500 mg in sodium chloride 0.9 % 250 mL IVPB (has no administration in time range)  ipratropium-albuterol (DUONEB) 0.5-2.5 (3) MG/3ML nebulizer solution 3 mL (has no administration in time range)    ED Course/ Medical Decision Making/ A&P   {   Click here for ABCD2, HEART and other calculatorsREFRESH Note before signing :1}                              Medical Decision Making Amount and/or Complexity of Data Reviewed Labs: ordered. Radiology: ordered.  Risk Prescription drug management.   This patient has presented with increased shortness of breath, oxygen of 89%, needs 2 L of oxygen, EKG is unremarkable, will obtain chest x-ray and labs   This patient presents to the ED for concern of shortness of breath, this involves an extensive number of treatment  options, and is a complaint that carries with it a high risk of complications and morbidity.  The differential diagnosis includes pneumonia, COPD, pleural effusions, emphysema, flu, COVID, anemia   Co morbidities that complicate the patient evaluation  History of hypertension, dementia   Additional history obtained:  Additional history obtained from the patient's accompanying medical record from the nursing facility External records from outside source obtained and reviewed including medication administration record   Lab Tests:  I Ordered, and personally interpreted labs.  The pertinent results include:  ***   Imaging Studies ordered:  I ordered imaging studies including ***  I independently visualized and interpreted imaging which showed *** I agree with the radiologist interpretation   Cardiac Monitoring: / EKG:  The patient was maintained on a cardiac monitor.  I personally viewed and interpreted the cardiac monitored which showed an underlying rhythm of: ***   Consultations Obtained:  I requested consultation with the ***,  and discussed lab and imaging findings as well as pertinent plan - they recommend: ***   Problem List / ED Course / Critical interventions / Medication management  *** I ordered medication including ***  for ***  Reevaluation of the patient after these medicines showed that the patient {resolved/improved/worsened:23923::"improved"} I have reviewed the patients home medicines and have made adjustments as needed   Social Determinants of Health:  ***   Test / Admission - Considered:  ***   {Document critical care time when appropriate:1} {Document review of labs and clinical decision tools ie heart score, Chads2Vasc2 etc:1}  {Document your independent review of radiology images, and any outside records:1} {Document your discussion with family members, caretakers, and with consultants:1} {Document social determinants of health affecting  pt's care:1} {Document your decision making why or why not admission, treatments were needed:1} Final Clinical Impression(s) / ED Diagnoses Final diagnoses:  None    Rx / DC Orders ED Discharge Orders  None

## 2023-09-08 NOTE — ED Provider Notes (Signed)
 Care of patient assumed from Dr. Hyacinth Meeker.  Patient with dementia presenting from skilled nursing facility for shortness of breath.  Found to have new hypoxia.  Currently on 3 L of oxygen.  Workup results pending. Physical Exam  BP (!) 159/92   Pulse 71   Temp 98.6 F (37 C) (Oral)   Resp 16   Ht 5\' 3"  (1.6 m)   Wt 57 kg   SpO2 97%   BMI 22.26 kg/m   Physical Exam Vitals and nursing note reviewed.  Constitutional:      General: She is not in acute distress.    Appearance: She is well-developed. She is not ill-appearing, toxic-appearing or diaphoretic.  HENT:     Head: Normocephalic and atraumatic.     Mouth/Throat:     Mouth: Mucous membranes are moist.  Eyes:     Conjunctiva/sclera: Conjunctivae normal.  Cardiovascular:     Rate and Rhythm: Normal rate and regular rhythm.     Heart sounds: Murmur heard.  Pulmonary:     Effort: Pulmonary effort is normal. No respiratory distress.     Breath sounds: Decreased breath sounds present. No wheezing.  Chest:     Chest wall: No tenderness.  Abdominal:     Palpations: Abdomen is soft.     Tenderness: There is no abdominal tenderness.  Musculoskeletal:        General: No swelling.     Cervical back: Normal range of motion and neck supple.  Skin:    General: Skin is warm and dry.  Neurological:     Mental Status: She is alert.     Procedures  Procedures  ED Course / MDM    Medical Decision Making Amount and/or Complexity of Data Reviewed Labs: ordered. Radiology: ordered.  Risk Prescription drug management.   Patient's lab work is notable for mild leukocytosis.  On CTA of chest, there was some interstitial thickening which may represent edema.  She has small bilateral pleural effusions.  There is some multifocal atelectasis.  Patient was trialed on room air.  At this time, she had desaturations to 82%.  She was placed back on 3 L of supplemental oxygen.  On lung auscultation, she does have diminished breath sounds  without evidence of wheezing.  She has a prominent cardiac murmur.  Per chart review, last echocardiogram was 6 years ago.  Dose of Lasix was ordered.  Patient to be admitted for further management.       Gloris Manchester, MD 09/09/23 [phone]

## 2023-09-08 NOTE — ED Triage Notes (Signed)
 Facility states pt was struggling to breath tonight. When ems arrived pt was 89% on room air.

## 2023-09-09 ENCOUNTER — Emergency Department (HOSPITAL_COMMUNITY)

## 2023-09-09 DIAGNOSIS — Z79899 Other long term (current) drug therapy: Secondary | ICD-10-CM | POA: Diagnosis not present

## 2023-09-09 DIAGNOSIS — Z8249 Family history of ischemic heart disease and other diseases of the circulatory system: Secondary | ICD-10-CM | POA: Diagnosis not present

## 2023-09-09 DIAGNOSIS — E876 Hypokalemia: Secondary | ICD-10-CM | POA: Diagnosis present

## 2023-09-09 DIAGNOSIS — Z881 Allergy status to other antibiotic agents status: Secondary | ICD-10-CM | POA: Diagnosis not present

## 2023-09-09 DIAGNOSIS — I110 Hypertensive heart disease with heart failure: Secondary | ICD-10-CM | POA: Diagnosis present

## 2023-09-09 DIAGNOSIS — I509 Heart failure, unspecified: Secondary | ICD-10-CM

## 2023-09-09 DIAGNOSIS — F32A Depression, unspecified: Secondary | ICD-10-CM | POA: Diagnosis present

## 2023-09-09 DIAGNOSIS — J9601 Acute respiratory failure with hypoxia: Principal | ICD-10-CM | POA: Diagnosis present

## 2023-09-09 DIAGNOSIS — J4489 Other specified chronic obstructive pulmonary disease: Secondary | ICD-10-CM | POA: Diagnosis present

## 2023-09-09 DIAGNOSIS — Z7902 Long term (current) use of antithrombotics/antiplatelets: Secondary | ICD-10-CM | POA: Diagnosis not present

## 2023-09-09 DIAGNOSIS — F03911 Unspecified dementia, unspecified severity, with agitation: Secondary | ICD-10-CM | POA: Diagnosis present

## 2023-09-09 DIAGNOSIS — Z800 Family history of malignant neoplasm of digestive organs: Secondary | ICD-10-CM | POA: Diagnosis not present

## 2023-09-09 DIAGNOSIS — I5033 Acute on chronic diastolic (congestive) heart failure: Secondary | ICD-10-CM | POA: Diagnosis present

## 2023-09-09 DIAGNOSIS — Z87891 Personal history of nicotine dependence: Secondary | ICD-10-CM | POA: Diagnosis not present

## 2023-09-09 DIAGNOSIS — Z1152 Encounter for screening for COVID-19: Secondary | ICD-10-CM | POA: Diagnosis not present

## 2023-09-09 DIAGNOSIS — Z809 Family history of malignant neoplasm, unspecified: Secondary | ICD-10-CM | POA: Diagnosis not present

## 2023-09-09 DIAGNOSIS — Z8673 Personal history of transient ischemic attack (TIA), and cerebral infarction without residual deficits: Secondary | ICD-10-CM | POA: Diagnosis not present

## 2023-09-09 DIAGNOSIS — I2510 Atherosclerotic heart disease of native coronary artery without angina pectoris: Secondary | ICD-10-CM | POA: Diagnosis present

## 2023-09-09 DIAGNOSIS — Z91040 Latex allergy status: Secondary | ICD-10-CM | POA: Diagnosis not present

## 2023-09-09 DIAGNOSIS — Z66 Do not resuscitate: Secondary | ICD-10-CM | POA: Diagnosis present

## 2023-09-09 DIAGNOSIS — R0602 Shortness of breath: Secondary | ICD-10-CM | POA: Diagnosis not present

## 2023-09-09 DIAGNOSIS — Z860100 Personal history of colon polyps, unspecified: Secondary | ICD-10-CM | POA: Diagnosis not present

## 2023-09-09 DIAGNOSIS — E7800 Pure hypercholesterolemia, unspecified: Secondary | ICD-10-CM | POA: Diagnosis present

## 2023-09-09 DIAGNOSIS — Z7982 Long term (current) use of aspirin: Secondary | ICD-10-CM | POA: Diagnosis not present

## 2023-09-09 DIAGNOSIS — Z91048 Other nonmedicinal substance allergy status: Secondary | ICD-10-CM | POA: Diagnosis not present

## 2023-09-09 LAB — URINALYSIS, W/ REFLEX TO CULTURE (INFECTION SUSPECTED)
Bacteria, UA: NONE SEEN
Bilirubin Urine: NEGATIVE
Glucose, UA: NEGATIVE mg/dL
Hgb urine dipstick: NEGATIVE
Ketones, ur: NEGATIVE mg/dL
Leukocytes,Ua: NEGATIVE
Nitrite: NEGATIVE
Protein, ur: 30 mg/dL — AB
Specific Gravity, Urine: 1.015 (ref 1.005–1.030)
pH: 6 (ref 5.0–8.0)

## 2023-09-09 LAB — CBC
HCT: 37.5 % (ref 36.0–46.0)
Hemoglobin: 12.2 g/dL (ref 12.0–15.0)
MCH: 31.9 pg (ref 26.0–34.0)
MCHC: 32.5 g/dL (ref 30.0–36.0)
MCV: 97.9 fL (ref 80.0–100.0)
Platelets: 257 10*3/uL (ref 150–400)
RBC: 3.83 MIL/uL — ABNORMAL LOW (ref 3.87–5.11)
RDW: 13.4 % (ref 11.5–15.5)
WBC: 11.1 10*3/uL — ABNORMAL HIGH (ref 4.0–10.5)
nRBC: 0 % (ref 0.0–0.2)

## 2023-09-09 LAB — MAGNESIUM: Magnesium: 2 mg/dL (ref 1.7–2.4)

## 2023-09-09 LAB — BRAIN NATRIURETIC PEPTIDE: B Natriuretic Peptide: 1240 pg/mL — ABNORMAL HIGH (ref 0.0–100.0)

## 2023-09-09 LAB — TSH: TSH: 0.714 u[IU]/mL (ref 0.350–4.500)

## 2023-09-09 LAB — CREATININE, SERUM
Creatinine, Ser: 0.87 mg/dL (ref 0.44–1.00)
GFR, Estimated: >60 mL/min (ref >60)

## 2023-09-09 LAB — PHOSPHORUS: Phosphorus: 3.3 mg/dL (ref 2.5–4.6)

## 2023-09-09 MED ORDER — LORAZEPAM 0.5 MG PO TABS: 0.5000 mg | ORAL_TABLET | Freq: Four times a day (QID) | ORAL | Status: DC | PRN

## 2023-09-09 MED ORDER — METOPROLOL SUCCINATE ER 25 MG PO TB24
100.0000 mg | ORAL_TABLET | Freq: Every day | ORAL | Status: DC
  Administered 2023-09-09 – 2023-09-12 (×4): 100 mg via ORAL
  Filled 2023-09-09 (×4): qty 4

## 2023-09-09 MED ORDER — FUROSEMIDE 10 MG/ML IJ SOLN
20.0000 mg | Freq: Two times a day (BID) | INTRAMUSCULAR | Status: DC
  Administered 2023-09-09 – 2023-09-12 (×6): 20 mg via INTRAVENOUS
  Filled 2023-09-09 (×6): qty 2

## 2023-09-09 MED ORDER — SODIUM CHLORIDE 0.9 % IV SOLN: 250.0000 mL | INTRAVENOUS | Status: AC | PRN

## 2023-09-09 MED ORDER — ENOXAPARIN SODIUM 40 MG/0.4ML IJ SOSY
40.0000 mg | PREFILLED_SYRINGE | INTRAMUSCULAR | Status: DC
  Administered 2023-09-09 – 2023-09-11 (×2): 40 mg via SUBCUTANEOUS
  Filled 2023-09-09 (×3): qty 0.4

## 2023-09-09 MED ORDER — UMECLIDINIUM-VILANTEROL 62.5-25 MCG/INH IN AEPB: 1.0000 | INHALATION_SPRAY | Freq: Every day | RESPIRATORY_TRACT | Status: DC

## 2023-09-09 MED ORDER — CLOPIDOGREL BISULFATE 75 MG PO TABS
75.0000 mg | ORAL_TABLET | Freq: Every day | ORAL | Status: DC
  Administered 2023-09-09 – 2023-09-12 (×4): 75 mg via ORAL
  Filled 2023-09-09 (×4): qty 1

## 2023-09-09 MED ORDER — DONEPEZIL HCL 5 MG PO TABS
10.0000 mg | ORAL_TABLET | Freq: Every day | ORAL | Status: DC
  Administered 2023-09-09 – 2023-09-11 (×3): 10 mg via ORAL
  Filled 2023-09-09 (×3): qty 2

## 2023-09-09 MED ORDER — OFLOXACIN 0.3 % OP SOLN: 2.0000 [drp] | Freq: Every day | OPHTHALMIC | Status: DC | PRN

## 2023-09-09 MED ORDER — HYDROCODONE-ACETAMINOPHEN 5-325 MG PO TABS: 0.5000 | ORAL_TABLET | Freq: Four times a day (QID) | ORAL | Status: DC | PRN

## 2023-09-09 MED ORDER — ASPIRIN 81 MG PO CHEW
81.0000 mg | CHEWABLE_TABLET | Freq: Every day | ORAL | Status: DC
  Administered 2023-09-09 – 2023-09-12 (×4): 81 mg via ORAL
  Filled 2023-09-09 (×4): qty 1

## 2023-09-09 MED ORDER — ONDANSETRON HCL 4 MG PO TABS: 4.0000 mg | ORAL_TABLET | Freq: Four times a day (QID) | ORAL | Status: DC | PRN

## 2023-09-09 MED ORDER — ATORVASTATIN CALCIUM 40 MG PO TABS: 80.0000 mg | ORAL_TABLET | Freq: Every day | ORAL | Status: DC

## 2023-09-09 MED ORDER — ACETAMINOPHEN 325 MG PO TABS: 650.0000 mg | ORAL_TABLET | Freq: Four times a day (QID) | ORAL | Status: DC | PRN

## 2023-09-09 MED ORDER — VENLAFAXINE HCL 37.5 MG PO TABS
75.0000 mg | ORAL_TABLET | Freq: Two times a day (BID) | ORAL | Status: DC
  Administered 2023-09-09 – 2023-09-12 (×6): 75 mg via ORAL
  Filled 2023-09-09 (×6): qty 2

## 2023-09-09 MED ORDER — OXYCODONE-ACETAMINOPHEN 10-325 MG PO TABS: 1.0000 | ORAL_TABLET | Freq: Three times a day (TID) | ORAL | Status: DC | PRN

## 2023-09-09 MED ORDER — QUETIAPINE FUMARATE 25 MG PO TABS
12.5000 mg | ORAL_TABLET | Freq: Every day | ORAL | Status: DC
  Administered 2023-09-09 – 2023-09-11 (×3): 12.5 mg via ORAL
  Filled 2023-09-09 (×3): qty 1

## 2023-09-09 MED ORDER — METOPROLOL TARTRATE 50 MG PO TABS: 100.0000 mg | ORAL_TABLET | Freq: Two times a day (BID) | ORAL | Status: DC

## 2023-09-09 MED ORDER — IPRATROPIUM-ALBUTEROL 0.5-2.5 (3) MG/3ML IN SOLN: 3.0000 mL | Freq: Four times a day (QID) | RESPIRATORY_TRACT | Status: DC | PRN

## 2023-09-09 MED ORDER — IOHEXOL 350 MG/ML SOLN
75.0000 mL | Freq: Once | INTRAVENOUS | Status: AC | PRN
  Administered 2023-09-09: 75 mL via INTRAVENOUS

## 2023-09-09 MED ORDER — ATORVASTATIN CALCIUM 40 MG PO TABS
40.0000 mg | ORAL_TABLET | Freq: Every day | ORAL | Status: DC
  Administered 2023-09-09 – 2023-09-11 (×3): 40 mg via ORAL
  Filled 2023-09-09 (×3): qty 1

## 2023-09-09 MED ORDER — ACETAMINOPHEN 650 MG RE SUPP: 650.0000 mg | Freq: Four times a day (QID) | RECTAL | Status: DC | PRN

## 2023-09-09 MED ORDER — ONDANSETRON HCL 4 MG/2ML IJ SOLN: 4.0000 mg | Freq: Four times a day (QID) | INTRAMUSCULAR | Status: DC | PRN

## 2023-09-09 MED ORDER — FUROSEMIDE 10 MG/ML IJ SOLN
20.0000 mg | Freq: Once | INTRAMUSCULAR | Status: AC
  Administered 2023-09-09: 20 mg via INTRAVENOUS
  Filled 2023-09-09: qty 2

## 2023-09-09 MED ORDER — SODIUM CHLORIDE 0.9% FLUSH
3.0000 mL | Freq: Two times a day (BID) | INTRAVENOUS | Status: DC
  Administered 2023-09-09 – 2023-09-12 (×6): 3 mL via INTRAVENOUS

## 2023-09-09 MED ORDER — SODIUM CHLORIDE 0.9% FLUSH: 3.0000 mL | INTRAVENOUS | Status: DC | PRN

## 2023-09-09 NOTE — Progress Notes (Signed)
 Son Nida Boatman leaving soon would like phone call once patient gets to inpatient room. Phone number confirmed in chart.

## 2023-09-09 NOTE — ED Notes (Signed)
 Hospitalist at bedside. Pt provided warm blanket and resting comfortably at this time. Pts son at bedside.

## 2023-09-09 NOTE — ED Notes (Signed)
 Attempted to remove oxygen from patient. Patient SpO2 dropped to 85%. Patient placed back on oxygen. 2lpm nasal cannula.

## 2023-09-09 NOTE — H&P (Signed)
 History and Physical    Patient: Connie Wood WUJ:[id number] DOB: [birth year] DOA: 09/08/2023 DOS: the patient was seen and examined on 09/09/2023 PCP: Sunday Spillers, NP   Patient coming from: [facility] (memory unit)  Chief Complaint:  Chief Complaint  Patient presents with   Shortness of Breath   HPI: Connie Wood is a 83 y.o. female with medical history significant of dementia without behavioral disturbances, hypertension, hyperlipidemia, history of TIA, asthma/COPD and osteoarthritis; who presented to the hospital secondary to shortness of breath.  Symptoms apparently has been present for the last 4 days or so and worsening.  There had not been any complaints of fever, chills, nausea, vomiting, focal neurologic deficits, dysuria/hematuria, melena or any other complaints.  Workup in the ED demonstrating positive hypoxia, elevated BNP and CT chest with positive interstitial edema and vascular congestion.  [facility] has been contacted to place patient in the hospital for further evaluation and management.  Review of Systems: As mentioned in the history of present illness. All other systems reviewed and are negative. Past Medical History:  Diagnosis Date   Back pain    Cat scratch of right forearm 02/03/2014   Dyspnea    Hard of hearing    Hemorrhoid 04/06/2014   History of kidney stones    Hx of degenerative disc disease    Hypercholesterolemia    Hypertension    Migraines    Osteoarthritis    Stroke (HCC)    mini stroke, resolved in 30 minutes   Past Surgical History:  Procedure Laterality Date   ABDOMINAL HYSTERECTOMY     ANTERIOR AND POSTERIOR REPAIR     APPENDECTOMY     BILATERAL SALPINGOOPHORECTOMY     BREAST SURGERY     CARDIAC CATHETERIZATION     CHOLECYSTECTOMY     colonoscopy  2003   Dr. Jena Gauss: normal rectum, scattered diverticula   COLONOSCOPY WITH PROPOFOL N/A 01/01/2016   Procedure: COLONOSCOPY WITH PROPOFOL;  Surgeon: Corbin Ade, MD;  Location: [facility];  Service: Endoscopy;  Laterality: N/A;  1015   ERCP with sphincterotomy  2003   Dr. Jena Gauss: balloon dredging of bile duct, normal ampulla, choledocholithiasis    EUS  2016   Dr. Logan Bores: subtle 13X17 mm heterogenous lesion in pancreas neck, FNA negative for malignancy    LEFT HEART CATH AND CORONARY ANGIOGRAPHY N/A 04/23/2018   Procedure: LEFT HEART CATH AND CORONARY ANGIOGRAPHY;  Surgeon: Lyn Records, MD;  Location: [facility] INVASIVE CV LAB;  Service: Cardiovascular;  Laterality: N/A;   POLYPECTOMY  01/01/2016   Procedure: POLYPECTOMY;  Surgeon: Corbin Ade, MD;  Location: [facility];  Service: Endoscopy;;   polypectomies x2-splenic flexure and cecal   TONSILLECTOMY     Social History:  reports that she has quit smoking. Her smoking use included cigarettes. She has a 50 pack-year smoking history. She has never used smokeless tobacco. She reports that she does not drink alcohol and does not use drugs.  Allergies  Allergen Reactions   Adhesive [Tape] Other (See Comments)    Tears skin   Doxycycline Nausea And Vomiting   Latex Itching and Rash    Family History  Problem Relation Age of Onset   Heart disease Mother    Cancer Father        liver   Cancer Son    Heart disease Son    Cancer Maternal Grandmother    Colon cancer Neg Hx     Prior  to Admission medications   Medication Sig Start Date End Date Taking? Authorizing Provider  acetaminophen (TYLENOL) 500 MG tablet Take 2 tablets (1,000 mg total) by mouth every 8 (eight) hours as needed for mild pain, moderate pain or headache. 04/25/18   Hongalgi, Maximino Greenland, MD  albuterol (PROVENTIL HFA;VENTOLIN HFA) 108 (90 Base) MCG/ACT inhaler Inhale 2 puffs into the lungs every 6 (six) hours as needed for wheezing or shortness of breath. 04/25/18   Hongalgi, Maximino Greenland, MD  aspirin 81 MG chewable tablet Chew 1 tablet (81 mg total) by mouth daily. 04/25/18   Hongalgi, Maximino Greenland, MD  atorvastatin (LIPITOR) 80 MG  tablet Take 1 tablet (80 mg total) by mouth daily at 6 PM. 04/25/18   Hongalgi, Maximino Greenland, MD  AZOR 10-40 MG per tablet Take 1 tablet by mouth daily. Reported on 12/04/2015 07/06/13   [provider]  benzonatate (TESSALON) 100 MG capsule Take 1 capsule (100 mg total) by mouth every 8 (eight) hours. 03/05/20   Avegno, Zachery Dakins, FNP  Calcium Citrate (CITRACAL PO) Take 1 tablet by mouth daily.     [provider]  Cholecalciferol (VITAMIN D PO) Take 1 tablet by mouth daily.     [provider]  clopidogrel (PLAVIX) 75 MG tablet Take 1 tablet (75 mg total) by mouth daily. 04/26/18   Hongalgi, Maximino Greenland, MD  fluconazole (DIFLUCAN) 150 MG tablet fluconazole 150 mg tablet  TK 1 T PO TODAY THEN TK 1 T PO 3 DAYS LATER    [provider]  fluticasone (FLONASE) 50 MCG/ACT nasal spray Place 1 spray into both nostrils daily for 14 days. 03/05/20 04/18/20  Avegno, Zachery Dakins, FNP  furosemide (LASIX) 20 MG tablet Take 1 tablet (20 mg total) by mouth daily. 05/06/18   Alwyn Ren, MD  hydrOXYzine (VISTARIL) 50 MG capsule Take 1 capsule by mouth daily.    [provider]  loperamide (IMODIUM) 2 MG capsule Take 4 mg by mouth as needed for diarrhea or loose stools.    [provider]  LORazepam (ATIVAN) 1 MG tablet Take 1 tablet by mouth at bedtime as needed. Take a 1/2 to 1 tablet by mouth at bedtime.    [provider]  metoprolol tartrate (LOPRESSOR) 100 MG tablet Take 1 tablet (100 mg total) by mouth 2 (two) times daily. 04/25/18   Hongalgi, Maximino Greenland, MD  ofloxacin (OCUFLOX) 0.3 % ophthalmic solution Place 2 drops into the right ear daily as needed (for pain). 2 drops in right ear daily prn. 01/14/14   [provider]  oxyCODONE-acetaminophen (PERCOCET) 10-325 MG tablet Take 1 tablet by mouth every 6 (six) hours as needed. 04/01/20   [provider]  potassium chloride (K-DUR,KLOR-CON) 10 MEQ tablet Take 1 tablet (10 mEq total) by mouth  daily. 05/06/18   Alwyn Ren, MD  umeclidinium-vilanterol [facility] ELLIPTA) 62.5-25 MCG/INH AEPB Inhale 1 puff into the lungs daily. 04/25/18   Elease Etienne, MD    Physical Exam: Vitals:   09/09/23 0515 09/09/23 0600 09/09/23 0700 09/09/23 0715  BP: (!) 186/84 (!) 180/74 (!) 180/99 (!) 171/91  Pulse: 74 86 80 77  Resp: 15 18    Temp:      TempSrc:      SpO2: 97% 96% 97% 98%  Weight:      Height:       General exam: Alert, awake, oriented x 2; following commands appropriately and reporting orthopnea.  2 L nasal cannula supplementation in  place Respiratory system: Decreased breath sounds at the bases with positive crackles; no wheezing.  Positive rhonchi on exam.  No using accessory muscle. Cardiovascular system: Rate controlled, no rubs, no gallops, positive systolic murmur appreciated. Gastrointestinal system: Abdomen is nondistended, soft and nontender. No organomegaly or masses felt. Normal bowel sounds heard. Central nervous system: Moving 4 limbs spontaneously.  No focal neurological deficits. Extremities: No cyanosis or clubbing, trace edema appreciated bilaterally. Skin: No petechiae. Psychiatry: Judgement and insight appear impaired secondary to underlying dementia.  Mood & affect appropriate.   Data Reviewed: Lactic acid: 0.8 Respiratory panel: Negative for COVID, RSV and influenza. CBC: WBC 10.9, hemoglobin 11.1 and platelet count 257K Comprehensive metabolic panel: Sodium 138, potassium 4.4, chloride 102, bicarb 27, BUN 26, creatinine 0.94, normal LFTs and GFR >60 BNP: 1240 TSH: 0.7   Assessment and Plan: 1-acute respiratory failure with hypoxia -Appears to be secondary to interstitial edema and vascular congestion; secondary to CHF. -IV Lasix has been started -Follow electrolytes and replete as needed -Low-sodium diet, daily weight and history -Will check REDs clip measurements -Follow 2D echo results  2-hypertension -Continue treatment with  metoprolol and now receiving Lasix -Follow vital signs -Low-sodium discussed with patient and family member at bedside.  3-hyperlipidemia -Continue Lipitor  4-history of TIA/coronary artery disease -Continue treatment with aspirin and Plavix -No chest pain -Continue telemetry monitoring. -Follow-up echo results -EKG/telemetry without acute ischemic changes.  5-history of dementia -Continue supportive care, constant reorientation and continue the use of  Aricept.  6-history of depression -Continue treatment with Effexor and Seroquel  7-history of COPD/asthma -Symptoms currently appreciated -Continue home bronchodilator management.  8-ethics/social -Patient DNR/DNI -Wishes will be granted and respected  -Continue to treat acute process without heroic measurements or intervention.    Advance Care Planning:   Code Status: Full Code   Consults: None  Family Communication: Son updated at bedside.  Severity of Illness: The appropriate patient status for this patient is INPATIENT. Inpatient status is judged to be reasonable and necessary in order to provide the required intensity of service to ensure the patient's safety. The patient's presenting symptoms, physical exam findings, and initial radiographic and laboratory data in the context of their chronic comorbidities is felt to place them at high risk for further clinical deterioration. Furthermore, it is not anticipated that the patient will be medically stable for discharge from the hospital within 2 midnights of admission.   * I certify that at the point of admission it is my clinical judgment that the patient will require inpatient hospital care spanning beyond 2 midnights from the point of admission due to high intensity of service, high risk for further deterioration and high frequency of surveillance required.*  Author: Vassie Loll, MD 09/09/2023 7:45 AM  For on call review [email or web address].

## 2023-09-10 ENCOUNTER — Inpatient Hospital Stay (HOSPITAL_COMMUNITY)

## 2023-09-10 DIAGNOSIS — J9601 Acute respiratory failure with hypoxia: Secondary | ICD-10-CM | POA: Diagnosis not present

## 2023-09-10 LAB — BASIC METABOLIC PANEL
Anion gap: 8 (ref 5–15)
BUN: 19 mg/dL (ref 8–23)
CO2: 29 mmol/L (ref 22–32)
Calcium: 8.8 mg/dL — ABNORMAL LOW (ref 8.9–10.3)
Chloride: 102 mmol/L (ref 98–111)
Creatinine, Ser: 1 mg/dL (ref 0.44–1.00)
GFR, Estimated: 56 mL/min — ABNORMAL LOW (ref >60)
Glucose, Bld: 100 mg/dL — ABNORMAL HIGH (ref 70–99)
Potassium: 3.4 mmol/L — ABNORMAL LOW (ref 3.5–5.1)
Sodium: 139 mmol/L (ref 135–145)

## 2023-09-10 MED ORDER — POTASSIUM CHLORIDE CRYS ER 20 MEQ PO TBCR
40.0000 meq | EXTENDED_RELEASE_TABLET | Freq: Once | ORAL | Status: AC
Start: 2023-09-10 — End: 2023-09-10
  Administered 2023-09-10: 40 meq via ORAL
  Filled 2023-09-10: qty 2

## 2023-09-10 NOTE — TOC Progression Note (Signed)
 Transition of Care [facility]) - Progression Note    Patient Details  Name: Connie Wood MRN: [id number] Date of Birth: Sep 13, 1940  Transition of Care [facility]) CM/SW Contact  Leitha Bleak, RN Phone Number: 09/10/2023, 12:46 PM  Clinical Narrative:   Patient admitted with Acute respiratory failure with hypoxia. Patient A/O x 2, TOC called her son to update the chart and offer home health RN for CHF education. Left voice message for him to call back. CHF education added to AVS.    Barriers to Discharge: Continued Medical Work up  Expected Discharge Plan and Services                                               Social Determinants of Health (SDOH) Interventions SDOH Screenings   Food Insecurity: No Food Insecurity (09/09/2023)  Housing: Low Risk  (09/09/2023)  Transportation Needs: No Transportation Needs (09/09/2023)  Utilities: Not At Risk (09/09/2023)  Tobacco Use: Medium Risk (09/08/2023)    Readmission Risk Interventions     No data to display

## 2023-09-10 NOTE — Progress Notes (Signed)
 Progress Note   Patient: Connie Wood ZOX:[id number] DOB: 1940/09/06 DOA: 09/08/2023     1 DOS: the patient was seen and examined on 09/10/2023   Brief hospital admission narrative: Connie Wood is a 83 y.o. female with medical history significant of dementia without behavioral disturbances, hypertension, hyperlipidemia, history of TIA, asthma/COPD and osteoarthritis; who presented to the hospital secondary to shortness of breath.  Symptoms apparently has been present for the last 4 days or so and worsening.  There had not been any complaints of fever, chills, nausea, vomiting, focal neurologic deficits, dysuria/hematuria, melena or any other complaints.   Workup in the ED demonstrating positive hypoxia, elevated BNP and CT chest with positive interstitial edema and vascular congestion.   TRH has been contacted to place patient in the hospital for further evaluation and management.  Assessment and Plan: 1-acute respiratory failure with hypoxia -Secondary to interstitial edema and vascular congestion in the setting of CHF. -Follow 2D echo results -Continue to follow daily weights, low-sodium diet, strict intake and output and IV Lasix -On today's examination 1 L nasal cannula supplementation in place and overall reporting feeling better and breathing easier.  2-hypertension -Continue treatment with Toprol and Lasix -Follow vital signs  3-hyperlipidemia -Continue Lipitor  4-history of TIA/coronary artery disease -Continue secondary prevention with the use of aspirin and Plavix -Patient reports no chest pain. -Will follow echo result -EKG and telemetry without acute ischemic changes.  5-history of depression -Continue treatment with Effexor and Seroquel.  6-history of COPD/asthma -No wheezing appreciated on exam -No using accessory muscle. -Continue home bronchodilator management.-  7-history of dementia -Continue supportive care and constant  reorientation -Continue the use of Aricept.  8-ethics/social -Patient DNR/DNI; CODE STATUS has been confirmed with patient's son at bedside -Wishes will be granted and respected -Continue treatment of acute process without any heroic measurements or interventions.  9-hypokalemia -Potassium borderline low at 3.4 in the setting of diuresis -Continue to follow electrolytes trend and further replete as needed.  Subjective:  No chest pain, no nausea or vomiting.  Patient reports breathing easier.  Pleasantly confused on exam.  1 L nasal cannula in place.  Physical Exam: Vitals:   09/09/23 1435 09/09/23 2012 09/10/23 0420 09/10/23 1429  BP:  136/87 137/77 (!) 147/78  Pulse:  (!) 104 86 88  Resp: 16     Temp:  98 F (36.7 C) (!) 97.5 F (36.4 C) 97.8 F (36.6 C)  TempSrc:  Oral Oral   SpO2:  98% 97% 98%  Weight:   53.9 kg   Height:       General exam: Alert, awake, oriented x 1; no chest pain, no nausea, no vomiting. Respiratory system: No wheezing on exam; decreased breath sounds at the bases.  No using accessory muscle. Cardiovascular system:RRR. No rubs or gallops; positive murmur. Gastrointestinal system: Abdomen is nondistended, soft and nontender. No organomegaly or masses felt. Normal bowel sounds heard. Central nervous system: Moving 4 limbs spontaneously.  No focal neurological deficits. Extremities: No signsof clubbing; trace edema appreciated bilaterally. Skin: No petechiae. Psychiatry: Judgement and insight appear impaired secondary to dementia.  Data Reviewed: Basic metabolic panel: Sodium 139, potassium 3.4, chloride 102, bicarb 29, BUN 19, creatinine 1.00 and GFR 56  Family Communication: No family at bedside.  Disposition: Status is: Inpatient Remains inpatient appropriate because: Continue IV diuresis.   Planned Discharge Destination: Patient from assisted living facility/memory unit.  Time spent: 50 minutes  Author: Vassie Loll, MD 09/10/2023 4:16  PM  For on call review [email or web address].

## 2023-09-10 NOTE — Progress Notes (Signed)
 Heart Failure Navigator Progress Note  Assessed for Heart & Vascular TOC clinic readiness.  Patient does not meet criteria due to dementia diagnosis and is mainly nonverbal per hospital notes. Patient currently lives in a senior living facility.  Navigator available for reassessment of patient but will sign off at this time.  Roxy Horseman, RN, BSN [facility] Heart Failure Navigator Secure Chat Only

## 2023-09-10 NOTE — Plan of Care (Signed)
 Problem: Education: Goal: Knowledge of General Education information will improve Description: Including pain rating scale, medication(s)/side effects and non-pharmacologic comfort measures Outcome: Progressing   Problem: Health Behavior/Discharge Planning: Goal: Ability to manage health-related needs will improve Outcome: Progressing   Problem: Clinical Measurements: Goal: Ability to maintain clinical measurements within normal limits will improve Outcome: Progressing Goal: Will remain free from infection Outcome: Progressing Goal: Diagnostic test results will improve Outcome: Progressing Goal: Respiratory complications will improve Outcome: Progressing Goal: Cardiovascular complication will be avoided Outcome: Progressing   Problem: Activity: Goal: Risk for activity intolerance will decrease Outcome: Progressing   Problem: Nutrition: Goal: Adequate nutrition will be maintained Outcome: Progressing   Problem: Coping: Goal: Level of anxiety will decrease Outcome: Progressing   Problem: Elimination: Goal: Will not experience complications related to bowel motility Outcome: Progressing Goal: Will not experience complications related to urinary retention Outcome: Progressing   Problem: Pain Managment: Goal: General experience of comfort will improve and/or be controlled Outcome: Progressing   Problem: Safety: Goal: Ability to remain free from injury will improve Outcome: Progressing   Problem: Skin Integrity: Goal: Risk for impaired skin integrity will decrease Outcome: Progressing   Problem: Education: Goal: Ability to demonstrate management of disease process will improve Outcome: Progressing Goal: Ability to verbalize understanding of medication therapies will improve Outcome: Progressing Goal: Individualized Educational Video(s) Outcome: Progressing   Problem: Activity: Goal: Capacity to carry out activities will improve Outcome: Progressing    Problem: Cardiac: Goal: Ability to achieve and maintain adequate cardiopulmonary perfusion will improve Outcome: Progressing   Problem: Education: Goal: Knowledge of General Education information will improve Description: Including pain rating scale, medication(s)/side effects and non-pharmacologic comfort measures Outcome: Progressing   Problem: Health Behavior/Discharge Planning: Goal: Ability to manage health-related needs will improve Outcome: Progressing   Problem: Clinical Measurements: Goal: Ability to maintain clinical measurements within normal limits will improve Outcome: Progressing Goal: Will remain free from infection Outcome: Progressing Goal: Diagnostic test results will improve Outcome: Progressing Goal: Respiratory complications will improve Outcome: Progressing Goal: Cardiovascular complication will be avoided Outcome: Progressing   Problem: Activity: Goal: Risk for activity intolerance will decrease Outcome: Progressing   Problem: Nutrition: Goal: Adequate nutrition will be maintained Outcome: Progressing   Problem: Coping: Goal: Level of anxiety will decrease Outcome: Progressing   Problem: Elimination: Goal: Will not experience complications related to bowel motility Outcome: Progressing Goal: Will not experience complications related to urinary retention Outcome: Progressing   Problem: Pain Managment: Goal: General experience of comfort will improve and/or be controlled Outcome: Progressing   Problem: Safety: Goal: Ability to remain free from injury will improve Outcome: Progressing   Problem: Skin Integrity: Goal: Risk for impaired skin integrity will decrease Outcome: Progressing   Problem: Education: Goal: Ability to demonstrate management of disease process will improve Outcome: Progressing Goal: Ability to verbalize understanding of medication therapies will improve Outcome: Progressing Goal: Individualized Educational  Video(s) Outcome: Progressing   Problem: Activity: Goal: Capacity to carry out activities will improve Outcome: Progressing   Problem: Cardiac: Goal: Ability to achieve and maintain adequate cardiopulmonary perfusion will improve Outcome: Progressing   Problem: Education: Goal: Knowledge of General Education information will improve Description: Including pain rating scale, medication(s)/side effects and non-pharmacologic comfort measures Outcome: Progressing   Problem: Health Behavior/Discharge Planning: Goal: Ability to manage health-related needs will improve Outcome: Progressing   Problem: Clinical Measurements: Goal: Ability to maintain clinical measurements within normal limits will improve Outcome: Progressing Goal: Will remain free from infection Outcome: Progressing Goal: Diagnostic test  results will improve Outcome: Progressing Goal: Respiratory complications will improve Outcome: Progressing Goal: Cardiovascular complication will be avoided Outcome: Progressing   Problem: Activity: Goal: Risk for activity intolerance will decrease Outcome: Progressing   Problem: Nutrition: Goal: Adequate nutrition will be maintained Outcome: Progressing   Problem: Coping: Goal: Level of anxiety will decrease Outcome: Progressing   Problem: Elimination: Goal: Will not experience complications related to bowel motility Outcome: Progressing Goal: Will not experience complications related to urinary retention Outcome: Progressing   Problem: Pain Managment: Goal: General experience of comfort will improve and/or be controlled Outcome: Progressing   Problem: Safety: Goal: Ability to remain free from injury will improve Outcome: Progressing   Problem: Skin Integrity: Goal: Risk for impaired skin integrity will decrease Outcome: Progressing   Problem: Education: Goal: Ability to demonstrate management of disease process will improve Outcome: Progressing Goal:  Ability to verbalize understanding of medication therapies will improve Outcome: Progressing Goal: Individualized Educational Video(s) Outcome: Progressing   Problem: Activity: Goal: Capacity to carry out activities will improve Outcome: Progressing   Problem: Cardiac: Goal: Ability to achieve and maintain adequate cardiopulmonary perfusion will improve Outcome: Progressing

## 2023-09-11 ENCOUNTER — Ambulatory Visit (HOSPITAL_COMMUNITY)

## 2023-09-11 ENCOUNTER — Encounter (HOSPITAL_COMMUNITY): Payer: Self-pay

## 2023-09-11 ENCOUNTER — Other Ambulatory Visit (HOSPITAL_COMMUNITY): Payer: Self-pay | Admitting: *Deleted

## 2023-09-11 ENCOUNTER — Inpatient Hospital Stay (HOSPITAL_COMMUNITY)

## 2023-09-11 DIAGNOSIS — J9601 Acute respiratory failure with hypoxia: Secondary | ICD-10-CM | POA: Diagnosis not present

## 2023-09-11 LAB — URINE CULTURE: Culture: 30000 — AB

## 2023-09-11 LAB — BASIC METABOLIC PANEL
Anion gap: 11 (ref 5–15)
BUN: 23 mg/dL (ref 8–23)
CO2: 27 mmol/L (ref 22–32)
Calcium: 9.2 mg/dL (ref 8.9–10.3)
Chloride: 102 mmol/L (ref 98–111)
Creatinine, Ser: 1 mg/dL (ref 0.44–1.00)
GFR, Estimated: 56 mL/min — ABNORMAL LOW (ref >60)
Glucose, Bld: 111 mg/dL — ABNORMAL HIGH (ref 70–99)
Potassium: 3.8 mmol/L (ref 3.5–5.1)
Sodium: 140 mmol/L (ref 135–145)

## 2023-09-11 NOTE — Plan of Care (Signed)

## 2023-09-11 NOTE — Progress Notes (Signed)
 Patient insisted I remove my mask. Patient refused echocardiogram.  Nurse informed by secure chat.      Celesta Gentile, RCS

## 2023-09-11 NOTE — Progress Notes (Signed)
 Nurse wanted me to attempt echo again. Patient became extremely aggressive when attempting echo.     Celesta Gentile, RCS

## 2023-09-11 NOTE — Progress Notes (Signed)
 Progress Note   Patient: Connie Wood FAO:[id number] DOB: [birth year] DOA: 09/08/2023     2 DOS: the patient was seen and examined on 09/11/2023   Brief hospital admission narrative: Connie Wood is a 83 y.o. female with medical history significant of dementia without behavioral disturbances, hypertension, hyperlipidemia, history of TIA, asthma/COPD and osteoarthritis; who presented to the hospital secondary to shortness of breath.  Symptoms apparently has been present for the last 4 days or so and worsening.  There had not been any complaints of fever, chills, nausea, vomiting, focal neurologic deficits, dysuria/hematuria, melena or any other complaints.   Workup in the ED demonstrating positive hypoxia, elevated BNP and CT chest with positive interstitial edema and vascular congestion.   TRH has been contacted to place patient in the hospital for further evaluation and management.  Assessment and Plan: 1-acute respiratory failure with hypoxia -Secondary to interstitial edema and vascular congestion in the setting of CHF. -After multiple attempts 2D echo unable to be completed; after discussing with patient's son decision made to treat empirically and discharged on oral diuretics. -Continue to follow daily weights, low-sodium diet, strict intake and output and IV Lasix for another 24 hours prior to discharge. -No requiring oxygen supplementation on today's evaluation.  2-hypertension -Continue treatment with Toprol and now Lasix -Follow vital signs  3-hyperlipidemia -Continue Lipitor  4-history of TIA/coronary artery disease -Continue secondary prevention with the use of aspirin and Plavix -Patient reports no chest pain. -Will follow echo result -EKG and telemetry without acute ischemic changes.  5-history of depression -Continue treatment with Effexor and Seroquel.  6-history of COPD/asthma -No wheezing appreciated on exam -No using accessory muscle. -Continue  home bronchodilator management.-  7-history of dementia -Continue supportive care and constant reorientation -Continue the use of Aricept.  8-ethics/social -Patient DNR/DNI; CODE STATUS has been confirmed with patient's son at bedside -Wishes will be granted and respected -Continue treatment of acute process without any heroic measurements or interventions.  9-hypokalemia - Has been repleted and currently stable -Continue to follow trend.  Subjective:  No chest pain, no fever, no nausea, no vomiting, pleasantly confused and in no major distress.  Intermittent episodes of restlessness and agitation has been reported by nursing staff.  She was pleasantly confused and able to follow simple commands at my evaluation.  Expressed feeling less short of breath.  Not requiring oxygen supplementation.  Physical Exam: Vitals:   09/11/23 0442 09/11/23 0841 09/11/23 1051 09/11/23 1353  BP: (!) 144/94 (!) 159/93 (!) 138/98 112/83  Pulse: 94 (!) 105 97 (!) 103  Resp: 20   18  Temp: 97.9 F (36.6 C) 99.2 F (37.3 C)  98.4 F (36.9 C)  TempSrc: Oral Oral    SpO2: 90% 93%  94%  Weight:      Height:       General exam: Alert, awake, oriented x 1; able to follow simple commands; very poor insight appreciated.  Pleasantly confused on my evaluation.  No longer requiring oxygen supplementation. Respiratory system: No wheezing, no frank crackles appreciated on exam.  No using accessory muscle. Cardiovascular system: Positive murmur, no rubs, no gallops, no JVD. Gastrointestinal system: Abdomen is nondistended, soft and nontender. No organomegaly or masses felt. Normal bowel sounds heard. Central nervous system: Moving 4 limbs spontaneously.  No focal neurological deficits. Extremities: No cyanosis or clubbing. Skin: No petechiae. Psychiatry: Judgement and insight appear impaired secondary to underlying dementia.  Data Reviewed: Basic metabolic panel: Sodium 140, potassium 3.8, chloride 102,  bicarb  27, BUN 23, creatinine 1.0 and GFR 56  Family Communication: Son updated over the phone.  Disposition: Status is: Inpatient Remains inpatient appropriate because: Continue IV diuresis.   Planned Discharge Destination: Patient from assisted living facility/memory unit.  Time spent: 35 minutes  Author: Vassie Loll, MD 09/11/2023 3:30 PM  For on call review [email or web address].

## 2023-09-11 NOTE — TOC Initial Note (Signed)
 Transition of Care [facility]) - Initial/Assessment Note    Patient Details  Name: Connie Wood MRN: [id number] Date of Birth: 1940/10/19  Transition of Care [facility]) CM/SW Contact:    Leitha Bleak, RN Phone Number: 09/11/2023, 3:13 PM  Clinical Narrative:       Nida Boatman, patient son called. Patient is from [facility], she was placed in the memory care unit 2 weeks ago. Rapid Cognitive decline over the last year. Continuing medical work up. Son plans for her to return to memory care at discharge. TOC following          Expected Discharge Plan:  (Memory Care) Barriers to Discharge: Continued Medical Work up   Patient Goals and CMS Choice Patient states their goals for this hospitalization and ongoing recovery are:: return to Memory care CMS Medicare.gov Compare Post Acute Care list provided to:: Patient Represenative (must comment) Choice offered to / list presented to : Adult Children      Expected Discharge Plan and Services        Prior Living Arrangements/Services   Lives with:: Facility Resident          Activities of Daily Living   ADL Screening (condition at time of admission) Independently performs ADLs?: No Does the patient have a NEW difficulty with bathing/dressing/toileting/self-feeding that is expected to last >3 days?: No Does the patient have a NEW difficulty with getting in/out of bed, walking, or climbing stairs that is expected to last >3 days?: Yes (Initiates electronic notice to provider for possible PT consult) Does the patient have a NEW difficulty with communication that is expected to last >3 days?: No Is the patient deaf or have difficulty hearing?: No Does the patient have difficulty seeing, even when wearing glasses/contacts?: No Does the patient have difficulty concentrating, remembering, or making decisions?: Yes  Permission Sought/Granted                  Emotional Assessment       Orientation: : Oriented to Self Alcohol /  Substance Use: Not Applicable Psych Involvement: No (comment)  Admission diagnosis:  Acute respiratory failure with hypoxia (HCC) [J96.01] Patient Active Problem List   Diagnosis Date Noted   Acute respiratory failure with hypoxia (HCC) 09/09/2023   Mixed conductive and sensorineural hearing loss of both ears 07/17/2023   CAD (coronary artery disease) 05/03/2018   Elevated troponin 05/03/2018   Anemia 05/03/2018   NSTEMI (non-ST elevated myocardial infarction) (HCC) 04/23/2018   AKI (acute kidney injury) (HCC) 04/23/2018   Obesity (BMI 30-39.9) 04/23/2018   Acute on chronic respiratory failure with hypoxia (HCC) 04/23/2018   Hypotension 04/23/2018   Abnormal cardiac enzyme level    IBS (irritable bowel syndrome) 01/06/2017   Constipation 07/17/2016   History of colonic polyps    Tobacco abuse 09/28/2014   GERD (gastroesophageal reflux disease) 09/28/2014   Depression 09/28/2014   Chronic back pain 09/28/2014   Anxiety 09/28/2014   Common bile duct dilation 08/24/2014   H/O acute pancreatitis 07/11/2014   Hemorrhoid 04/06/2014   Hypertension 02/03/2014   Hypercholesteremia 02/03/2014   Dyspnea 07/12/2013   Chronic bronchitis (HCC) 07/11/2013   Acute bronchitis 07/11/2013   Nonhealing nonsurgical wound 04/05/2013   Stasis edema 04/05/2013   PCP:  Sunday Spillers, NP Pharmacy:   [facility] [place] - [address] [place] [address] Phone: [phone] Fax: [phone]  [facility] - [place], [place] - [address] [place]  [address] Phone: [phone] Fax: [phone]     Social Drivers of Health (SDOH) Social History: SDOH Screenings   Food Insecurity: No Food Insecurity (09/09/2023)  Housing: Low Risk  (09/09/2023)  Transportation Needs: No Transportation Needs (09/09/2023)  Utilities: Not At Risk (09/09/2023)  Tobacco Use: Medium Risk (09/08/2023)   SDOH Interventions:      Readmission Risk Interventions     No data to display

## 2023-09-12 ENCOUNTER — Inpatient Hospital Stay (HOSPITAL_COMMUNITY)

## 2023-09-12 DIAGNOSIS — I110 Hypertensive heart disease with heart failure: Secondary | ICD-10-CM | POA: Diagnosis not present

## 2023-09-12 DIAGNOSIS — I509 Heart failure, unspecified: Secondary | ICD-10-CM | POA: Diagnosis not present

## 2023-09-12 DIAGNOSIS — J9601 Acute respiratory failure with hypoxia: Secondary | ICD-10-CM | POA: Diagnosis not present

## 2023-09-12 DIAGNOSIS — R0602 Shortness of breath: Secondary | ICD-10-CM

## 2023-09-12 LAB — ECHOCARDIOGRAM COMPLETE
AR max vel: 0.95 cm2
AV Area VTI: 1.02 cm2
AV Area mean vel: 0.95 cm2
AV Mean grad: 21 mmHg
AV Peak grad: 37 mmHg
Ao pk vel: 3.04 m/s
Area-P 1/2: 2.65 cm2
Calc EF: 78.5 %
Height: 63 in
MV VTI: 1.38 cm2
S' Lateral: 1.7 cm
Single Plane A2C EF: 73 %
Single Plane A4C EF: 84.5 %
Weight: 1901.25 [oz_av]

## 2023-09-12 LAB — BASIC METABOLIC PANEL
Anion gap: 9 (ref 5–15)
BUN: 24 mg/dL — ABNORMAL HIGH (ref 8–23)
CO2: 27 mmol/L (ref 22–32)
Calcium: 8 mg/dL — ABNORMAL LOW (ref 8.9–10.3)
Chloride: 101 mmol/L (ref 98–111)
Creatinine, Ser: 1.06 mg/dL — ABNORMAL HIGH (ref 0.44–1.00)
GFR, Estimated: 52 mL/min — ABNORMAL LOW (ref >60)
Glucose, Bld: 108 mg/dL — ABNORMAL HIGH (ref 70–99)
Potassium: 3.3 mmol/L — ABNORMAL LOW (ref 3.5–5.1)
Sodium: 137 mmol/L (ref 135–145)

## 2023-09-12 MED ORDER — FUROSEMIDE 20 MG PO TABS: 40.0000 mg | ORAL_TABLET | Freq: Every day | ORAL | 2 refills | Status: DC

## 2023-09-12 MED ORDER — LOSARTAN POTASSIUM 25 MG PO TABS: 25.0000 mg | ORAL_TABLET | Freq: Every day | ORAL | 3 refills | Status: DC

## 2023-09-12 NOTE — Care Management Important Message (Signed)
 Important Message  Patient Details  Name: Connie Wood MRN: [id number] Date of Birth: 1940-11-15   Important Message Given:  Yes - Medicare IM     Corey Harold 09/12/2023, 11:25 AM

## 2023-09-12 NOTE — TOC Transition Note (Signed)
 Transition of Care [facility]) - Discharge Note   Patient Details  Name: Connie Wood MRN: [id number] Date of Birth: 03-31-1941  [facility] CM/SW Contact:  Leitha Bleak, RN Phone Number: 09/12/2023, 1:28 PM   Clinical Narrative:   Patient discharging back to [facility] today. FL2 and DC summary faxed to [facility] CM called her son to update him. He will call [facility], they will work out transport. RN updated.    Final next level of care: Memory Care Barriers to Discharge: Barriers Resolved   Patient Goals and CMS Choice Patient states their goals for this hospitalization and ongoing recovery are:: return to Memory care CMS Medicare.gov Compare Post Acute Care list provided to:: Patient Represenative (must comment) Choice offered to / list presented to : Adult Children     Discharge Placement                Patient to be transferred to facility by: Son Name of family member notified: Son Patient and family notified of of transfer: 09/12/23  Discharge Plan and Services Additional resources added to the After Visit Summary for        Social Drivers of Health (SDOH) Interventions SDOH Screenings   Food Insecurity: No Food Insecurity (09/09/2023)  Housing: Low Risk  (09/09/2023)  Transportation Needs: No Transportation Needs (09/09/2023)  Utilities: Not At Risk (09/09/2023)  Social Connections: Patient Unable To Answer (09/11/2023)  Tobacco Use: Medium Risk (09/08/2023)     Readmission Risk Interventions    09/12/2023    1:27 PM  Readmission Risk Prevention Plan  Transportation Screening Complete  PCP or Specialist Appt within 5-7 Days Not Complete  Home Care Screening Complete  Medication Review (RN CM) Complete

## 2023-09-12 NOTE — [place] FL2 (Signed)
 [place] MEDICAID FL2 LEVEL OF CARE FORM     IDENTIFICATION  Patient Name: Connie Wood Birthdate: 05/04/1941 Sex: female Admission Date (Current Location): 09/08/2023  [facility] and [place] Number:  [employer] and Address:  [facility],  [address], [place] [address]      Provider Number: [id number]  Attending Physician Name and Address:  Vassie Loll, MD  Relative Name and Phone Number:  Godette,Brad (Son)  [phone]    Current Level of Care: Hospital Recommended Level of Care: Memory Care Prior Approval Number:    Date Approved/Denied:   PASRR Number:    Discharge Plan: Domiciliary (Rest home)    Current Diagnoses: Patient Active Problem List   Diagnosis Date Noted   Acute respiratory failure with hypoxia (HCC) 09/09/2023   Mixed conductive and sensorineural hearing loss of both ears 07/17/2023   CAD (coronary artery disease) 05/03/2018   Elevated troponin 05/03/2018   Anemia 05/03/2018   NSTEMI (non-ST elevated myocardial infarction) (HCC) 04/23/2018   AKI (acute kidney injury) (HCC) 04/23/2018   Obesity (BMI 30-39.9) 04/23/2018   Acute on chronic respiratory failure with hypoxia (HCC) 04/23/2018   Hypotension 04/23/2018   Abnormal cardiac enzyme level    IBS (irritable bowel syndrome) 01/06/2017   Constipation 07/17/2016   History of colonic polyps    Tobacco abuse 09/28/2014   GERD (gastroesophageal reflux disease) 09/28/2014   Depression 09/28/2014   Chronic back pain 09/28/2014   Anxiety 09/28/2014   Common bile duct dilation 08/24/2014   H/O acute pancreatitis 07/11/2014   Hemorrhoid 04/06/2014   Hypertension 02/03/2014   Hypercholesteremia 02/03/2014   Dyspnea 07/12/2013   Chronic bronchitis (HCC) 07/11/2013   Acute bronchitis 07/11/2013   Nonhealing nonsurgical wound 04/05/2013   Stasis edema 04/05/2013    Orientation RESPIRATION BLADDER Height & Weight     Self  Normal Incontinent Weight: 53.9  kg Height:  5\' 3"  (160 cm)  BEHAVIORAL SYMPTOMS/MOOD NEUROLOGICAL BOWEL NUTRITION STATUS      Incontinent Diet (regular)  AMBULATORY STATUS COMMUNICATION OF NEEDS Skin   Limited Assist Verbally Normal                       Personal Care Assistance Level of Assistance  Bathing, Feeding, Dressing Bathing Assistance: Limited assistance Feeding assistance: Limited assistance Dressing Assistance: Limited assistance     Functional Limitations Info  Sight, Hearing, Speech Sight Info: Adequate Hearing Info: Impaired Speech Info: Adequate    SPECIAL CARE FACTORS FREQUENCY  PT (By licensed PT)     PT Frequency: 3 times a week              Contractures Contractures Info: Not present    Additional Factors Info  Code Status, Allergies Code Status Info: DNR- Limited Allergies Info: Adhesvie, Doxycycline, Latex           Current Medications (09/12/2023):  This is the current hospital active medication list Current Facility-Administered Medications  Medication Dose Route Frequency Provider Last Rate Last Admin   acetaminophen (TYLENOL) tablet 650 mg  650 mg Oral Q6H PRN Vassie Loll, MD       Or   acetaminophen (TYLENOL) suppository 650 mg  650 mg Rectal Q6H PRN Vassie Loll, MD       aspirin chewable tablet 81 mg  81 mg Oral Daily Vassie Loll, MD   81 mg at 09/12/23 1102   atorvastatin (LIPITOR) tablet 40 mg  40 mg Oral q1800 Vassie Loll, MD  40 mg at 09/11/23 1733   clopidogrel (PLAVIX) tablet 75 mg  75 mg Oral Daily Vassie Loll, MD   75 mg at 09/12/23 1102   donepezil (ARICEPT) tablet 10 mg  10 mg Oral QHS Vassie Loll, MD   10 mg at 09/11/23 2237   enoxaparin (LOVENOX) injection 40 mg  40 mg Subcutaneous Q24H Vassie Loll, MD   40 mg at 09/11/23 2237   furosemide (LASIX) injection 20 mg  20 mg Intravenous BID Vassie Loll, MD   20 mg at 09/12/23 4098   HYDROcodone-acetaminophen (NORCO/VICODIN) 5-325 MG per tablet 0.5 tablet  0.5 tablet Oral Q6H PRN  Vassie Loll, MD       ipratropium-albuterol (DUONEB) 0.5-2.5 (3) MG/3ML nebulizer solution 3 mL  3 mL Nebulization Q6H PRN Vassie Loll, MD       metoprolol succinate (TOPROL-XL) 24 hr tablet 100 mg  100 mg Oral Daily Vassie Loll, MD   100 mg at 09/12/23 1102   ondansetron (ZOFRAN) tablet 4 mg  4 mg Oral Q6H PRN Vassie Loll, MD       Or   ondansetron [facility]) injection 4 mg  4 mg Intravenous Q6H PRN Vassie Loll, MD       QUEtiapine (SEROQUEL) tablet 12.5 mg  12.5 mg Oral QHS Vassie Loll, MD   12.5 mg at 09/11/23 2237   sodium chloride flush (NS) 0.9 % injection 3 mL  3 mL Intravenous Q12H Vassie Loll, MD   3 mL at 09/11/23 2237   sodium chloride flush (NS) 0.9 % injection 3 mL  3 mL Intravenous PRN Vassie Loll, MD       venlafaxine [facility]) tablet 75 mg  75 mg Oral BID WC Vassie Loll, MD   75 mg at 09/12/23 1191     Discharge Medications: Allergies as of 09/12/2023       Reactions   Adhesive [tape] Other (See Comments)   Tears skin   Doxycycline Nausea And Vomiting   Latex Itching, Rash        Medication List     STOP taking these medications    Azor 10-40 MG tablet Generic drug: amLODipine-olmesartan       TAKE these medications    acetaminophen 500 MG tablet Commonly known as: TYLENOL Take 2 tablets (1,000 mg total) by mouth every 8 (eight) hours as needed for mild pain, moderate pain or headache. What changed:  how much to take when to take this   albuterol 108 (90 Base) MCG/ACT inhaler Commonly known as: VENTOLIN HFA Inhale 2 puffs into the lungs every 6 (six) hours as needed for wheezing or shortness of breath.   aspirin 81 MG chewable tablet Chew 1 tablet (81 mg total) by mouth daily.   atorvastatin 40 MG tablet Commonly known as: LIPITOR Take 40 mg by mouth daily.   clopidogrel 75 MG tablet Commonly known as: PLAVIX Take 1 tablet (75 mg total) by mouth daily.   donepezil 10 MG tablet Commonly known as: ARICEPT Take 10 mg  by mouth daily.   fluconazole 150 MG tablet Commonly known as: DIFLUCAN Take 150 mg by mouth daily as needed (recurrent yeast infections).   fluticasone 50 MCG/ACT nasal spray Commonly known as: FLONASE Place 1 spray into both nostrils daily for 14 days.   furosemide 20 MG tablet Commonly known as: LASIX Take 2 tablets (40 mg total) by mouth daily. What changed: how much to take   HYDROcodone-acetaminophen 5-325 MG tablet Commonly known as: NORCO/VICODIN Take 0.5 tablets  by mouth every 6 (six) hours as needed for moderate pain (pain score 4-6) or severe pain (pain score 7-10).   hydrOXYzine 50 MG capsule Commonly known as: VISTARIL Take 1 capsule by mouth daily.   Lagevrio 200 MG Caps capsule Generic drug: molnupiravir EUA Take 4 capsules by mouth 2 (two) times daily.   lidocaine 5 % Commonly known as: LIDODERM Place 1 patch onto the skin daily.   losartan 25 MG tablet Commonly known as: Cozaar Take 1 tablet (25 mg total) by mouth daily.   melatonin 3 MG Tabs tablet Take 3 mg by mouth at bedtime.   metoprolol succinate 100 MG 24 hr tablet Commonly known as: TOPROL-XL Take 100 mg by mouth daily.   potassium chloride SA 20 MEQ tablet Commonly known as: KLOR-CON M Take 20 mEq by mouth once.   QUEtiapine 25 MG tablet Commonly known as: SEROQUEL Take 12.5 mg by mouth at bedtime.   venlafaxine 75 MG tablet Commonly known as: EFFEXOR Take 75 mg by mouth 2 (two) times daily.   Vitamin D 50 MCG (2000 UT) Caps Take 1 capsule by mouth daily.         Relevant Imaging Results:  Relevant Lab Results:   Additional Information SS# [id number]  Leitha Bleak, RN

## 2023-09-12 NOTE — Consult Note (Signed)
 [facility] Liaison Consult Note    09/12/2023  Connie Wood [birth year] [id number]  Primary: Octavia Bruckner NP   Insurance: [facility] Chart review due to green banner. Pt is not eligible for VBCI services. Pt is a resident at [facility] (memory unit)  VBCI/Population Health does not interfere with TOC Transition of care arrangement post hospital discharge.   Please contact liaison with any additional issues.  Elliot Cousin, RN, BSN Hospital Liaison [place]   [facility], Population Health Office Hours MTWF  8:00 am-6:00 pm Direct Dial: [phone] mobile [email or web address]

## 2023-09-12 NOTE — Discharge Summary (Signed)
 Physician Discharge Summary   Patient: Connie Wood MRN: [id number] DOB: 08-30-1940  Admit date:     09/08/2023  Discharge date: 09/12/23  Discharge Physician: Vassie Loll   PCP: Sunday Spillers, NP   Recommendations at discharge:  Repeat basic metabolic panel to follow electrolytes and renal function Continue to reassess patient's volume status and further adjust diuretic regimen as needed. Further goals of care discussion/advance care planning with initiation of hospice/symptomatic management as per discussion with family members.  Discharge Diagnoses: Principal Problem:   Acute respiratory failure with hypoxia (HCC) Hypertension History of COPD/asthma Hypokalemia History of TIA/coronary artery disease Dementia with behavioral disturbances  Brief hospital admission narrative: Connie Wood is a 83 y.o. female with medical history significant of dementia without behavioral disturbances, hypertension, hyperlipidemia, history of TIA, asthma/COPD and osteoarthritis; who presented to the hospital secondary to shortness of breath.  Symptoms apparently has been present for the last 4 days or so and worsening.  There had not been any complaints of fever, chills, nausea, vomiting, focal neurologic deficits, dysuria/hematuria, melena or any other complaints.   Workup in the ED demonstrating positive hypoxia, elevated BNP and CT chest with positive interstitial edema and vascular congestion.   TRH has been contacted to place patient in the hospital for further evaluation and management.  Assessment and Plan: 1-acute respiratory failure with hypoxia -Secondary to interstitial edema and vascular congestion in the setting of CHF. -After multiple attempts 2D echo unable to be completed; after discussing with patient's son decision made to treat empirically and discharged on oral diuretics. -Continue to follow daily weights, low-sodium diet and maintain adequate  hydration -Patient has been discharged on oral Lasix 40 mg daily. -No requiring oxygen supplementation at discharge.   2-hypertension -Continue treatment with Toprol, Lasix and Cozaar -Follow vital signs (adjust medications as required.   3-hyperlipidemia -Continue Lipitor -Continue heart healthy diet.   4-history of TIA/coronary artery disease -Continue secondary prevention with the use of aspirin and Plavix -Patient reports no chest pain. -Will follow echo result -EKG and telemetry without acute ischemic changes.   5-history of depression -Continue treatment with Effexor and Seroquel.   6-history of COPD/asthma -No wheezing appreciated on exam -No using accessory muscle. -Continue home bronchodilator management.-   7-history of dementia with behavioral disturbances -Continue supportive care and constant reorientation -Continue the use of Aricept.   8-ethics/social -Patient DNR/DNI; CODE STATUS has been confirmed with patient's son at bedside -Wishes will be granted and respected -Continue treatment of acute process without any heroic measurements or interventions. -Will recommend further goals of care/advance care planning discussion as an outpatient.   9-hypokalemia -Has been repleted and currently stable -Continue to follow trend.  Consultants: None Procedures performed: See below for x-ray reports. Disposition: ALF/memory unit. Diet recommendation: Heart healthy diet.  DISCHARGE MEDICATION: Allergies as of 09/12/2023       Reactions   Adhesive [tape] Other (See Comments)   Tears skin   Doxycycline Nausea And Vomiting   Latex Itching, Rash        Medication List     STOP taking these medications    Azor 10-40 MG tablet Generic drug: amLODipine-olmesartan       TAKE these medications    acetaminophen 500 MG tablet Commonly known as: TYLENOL Take 2 tablets (1,000 mg total) by mouth every 8 (eight) hours as needed for mild pain, moderate pain or  headache. What changed:  how much to take when to take this   albuterol  108 (90 Base) MCG/ACT inhaler Commonly known as: VENTOLIN HFA Inhale 2 puffs into the lungs every 6 (six) hours as needed for wheezing or shortness of breath.   aspirin 81 MG chewable tablet Chew 1 tablet (81 mg total) by mouth daily.   atorvastatin 40 MG tablet Commonly known as: LIPITOR Take 40 mg by mouth daily.   clopidogrel 75 MG tablet Commonly known as: PLAVIX Take 1 tablet (75 mg total) by mouth daily.   donepezil 10 MG tablet Commonly known as: ARICEPT Take 10 mg by mouth daily.   fluconazole 150 MG tablet Commonly known as: DIFLUCAN Take 150 mg by mouth daily as needed (recurrent yeast infections).   fluticasone 50 MCG/ACT nasal spray Commonly known as: FLONASE Place 1 spray into both nostrils daily for 14 days.   furosemide 20 MG tablet Commonly known as: LASIX Take 2 tablets (40 mg total) by mouth daily. What changed: how much to take   HYDROcodone-acetaminophen 5-325 MG tablet Commonly known as: NORCO/VICODIN Take 0.5 tablets by mouth every 6 (six) hours as needed for moderate pain (pain score 4-6) or severe pain (pain score 7-10).   hydrOXYzine 50 MG capsule Commonly known as: VISTARIL Take 1 capsule by mouth daily.   Lagevrio 200 MG Caps capsule Generic drug: molnupiravir EUA Take 4 capsules by mouth 2 (two) times daily.   lidocaine 5 % Commonly known as: LIDODERM Place 1 patch onto the skin daily.   losartan 25 MG tablet Commonly known as: Cozaar Take 1 tablet (25 mg total) by mouth daily.   melatonin 3 MG Tabs tablet Take 3 mg by mouth at bedtime.   metoprolol succinate 100 MG 24 hr tablet Commonly known as: TOPROL-XL Take 100 mg by mouth daily.   potassium chloride SA 20 MEQ tablet Commonly known as: KLOR-CON M Take 20 mEq by mouth once.   QUEtiapine 25 MG tablet Commonly known as: SEROQUEL Take 12.5 mg by mouth at bedtime.   venlafaxine 75 MG  tablet Commonly known as: EFFEXOR Take 75 mg by mouth 2 (two) times daily.   Vitamin D 50 MCG (2000 UT) Caps Take 1 capsule by mouth daily.        Discharge Exam: Filed Weights   09/08/23 2135 09/10/23 0420  Weight: 57 kg 53.9 kg    General exam: Alert, awake, oriented x 1; able to follow simple commands; very poor insight appreciated.  Pleasantly confused on my evaluation.  No longer requiring oxygen supplementation. Respiratory system: No wheezing, no frank crackles appreciated on exam.  No using accessory muscle. Cardiovascular system: Positive murmur, no rubs, no gallops, no JVD. Gastrointestinal system: Abdomen is nondistended, soft and nontender. No organomegaly or masses felt. Normal bowel sounds heard. Central nervous system: Moving 4 limbs spontaneously.  No focal neurological deficits. Extremities: No cyanosis or clubbing. Skin: No petechiae. Psychiatry: Judgement and insight appear impaired secondary to underlying dementia.  Condition at discharge: Stable and improved.  The results of significant diagnostics from this hospitalization (including imaging, microbiology, ancillary and laboratory) are listed below for reference.   Imaging Studies: CT Angio Chest PE W and/or Wo Contrast Result Date: 09/09/2023 CLINICAL DATA:  Difficulty breathing. EXAM: CT ANGIOGRAPHY CHEST WITH CONTRAST TECHNIQUE: Multidetector CT imaging of the chest was performed using the standard protocol during bolus administration of intravenous contrast. Multiplanar CT image reconstructions and MIPs were obtained to evaluate the vascular anatomy. RADIATION DOSE REDUCTION: This exam was performed according to the departmental dose-optimization program which includes automated exposure control, adjustment  of the mA and/or kV according to patient size and/or use of iterative reconstruction technique. CONTRAST:  75mL OMNIPAQUE IOHEXOL 350 MG/ML SOLN COMPARISON:  July 26, 2023 FINDINGS: Cardiovascular:  There is marked severity calcification of the thoracic aorta, without evidence of aortic aneurysm. Satisfactory opacification of the pulmonary arteries to the segmental level. No evidence of pulmonary embolism. Normal heart size with marked severity coronary artery calcification. No pericardial effusion. Mediastinum/Nodes: No enlarged mediastinal, hilar, or axillary lymph nodes. Thyroid gland, trachea, and esophagus demonstrate no significant findings. Lungs/Pleura: There is diffuse, bilateral interstitial thickening. This represents a new finding when compared to the prior study. Mild anteromedial left upper lobe, right middle lobe and posterior bibasilar scarring and/or atelectasis is seen. There are small bilateral pleural effusions. No pneumothorax is identified. Upper Abdomen: No acute abnormality. Musculoskeletal: Multilevel degenerative changes are seen throughout the thoracic spine. Review of the MIP images confirms the above findings. IMPRESSION: 1. No evidence of pulmonary embolism. 2. Diffuse, bilateral interstitial thickening which may represent sequelae associated with interstitial edema. 3. Small bilateral pleural effusions. 4. Mild anteromedial left upper lobe, right middle lobe and posterior bibasilar scarring and/or atelectasis. 5. Marked severity coronary artery calcification. 6. Aortic atherosclerosis. Aortic Atherosclerosis (ICD10-I70.0). Electronically Signed   By: Aram Candela M.D.   On: 09/09/2023 02:58   DG Chest Port 1 View Result Date: 09/08/2023 CLINICAL DATA:  Questionable sepsis. EXAM: PORTABLE CHEST 1 VIEW COMPARISON:  July 26, 2023 FINDINGS: The heart size and mediastinal contours are within normal limits. There is marked severity calcification of the thoracic aorta. Mild linear atelectasis is seen within the mid lung fields and bilateral lung bases. There is no evidence of focal consolidation, pleural effusion or pneumothorax. Numerous small, round, radiopaque foreign  bodies are seen overlying the bilateral hilar regions and periphery of the mid right hemithorax. There is dextroscoliosis of the lower thoracic and upper lumbar spine. IMPRESSION: Mild bilateral linear atelectasis. Electronically Signed   By: Aram Candela M.D.   On: 09/08/2023 22:52    Microbiology: Results for orders placed or performed during the hospital encounter of 09/08/23  Resp panel by RT-PCR (RSV, Flu A&B, Covid) Anterior Nasal Swab     Status: None   Collection Time: 09/08/23 10:18 PM   Specimen: Anterior Nasal Swab  Result Value Ref Range Status   SARS Coronavirus 2 by RT PCR NEGATIVE NEGATIVE Final    Comment: (NOTE) SARS-CoV-2 target nucleic acids are NOT DETECTED.  The SARS-CoV-2 RNA is generally detectable in upper respiratory specimens during the acute phase of infection. The lowest concentration of SARS-CoV-2 viral copies this assay can detect is 138 copies/mL. A negative result does not preclude SARS-Cov-2 infection and should not be used as the sole basis for treatment or other patient management decisions. A negative result may occur with  improper specimen collection/handling, submission of specimen other than nasopharyngeal swab, presence of viral mutation(s) within the areas targeted by this assay, and inadequate number of viral copies(<138 copies/mL). A negative result must be combined with clinical observations, patient history, and epidemiological information. The expected result is Negative.  Fact Sheet for Patients:  BloggerCourse.com  Fact Sheet for Healthcare Providers:  SeriousBroker.it  This test is no t yet approved or cleared by the [place] FDA and  has been authorized for detection and/or diagnosis of SARS-CoV-2 by FDA under an Emergency Use Authorization (EUA). This EUA will remain  in effect (meaning this test can be used) for the duration of the COVID-19 declaration under  Section  564(b)(1) of the Act, 21 U.S.C.section 360bbb-3(b)(1), unless the authorization is terminated  or revoked sooner.       Influenza A by PCR NEGATIVE NEGATIVE Final   Influenza B by PCR NEGATIVE NEGATIVE Final    Comment: (NOTE) The Xpert Xpress SARS-CoV-2/FLU/RSV plus assay is intended as an aid in the diagnosis of influenza from Nasopharyngeal swab specimens and should not be used as a sole basis for treatment. Nasal washings and aspirates are unacceptable for Xpert Xpress SARS-CoV-2/FLU/RSV testing.  Fact Sheet for Patients: BloggerCourse.com  Fact Sheet for Healthcare Providers: SeriousBroker.it  This test is not yet approved or cleared by the [place] FDA and has been authorized for detection and/or diagnosis of SARS-CoV-2 by FDA under an Emergency Use Authorization (EUA). This EUA will remain in effect (meaning this test can be used) for the duration of the COVID-19 declaration under Section 564(b)(1) of the Act, 21 U.S.C. section 360bbb-3(b)(1), unless the authorization is terminated or revoked.     Resp Syncytial Virus by PCR NEGATIVE NEGATIVE Final    Comment: (NOTE) Fact Sheet for Patients: BloggerCourse.com  Fact Sheet for Healthcare Providers: SeriousBroker.it  This test is not yet approved or cleared by the [place] FDA and has been authorized for detection and/or diagnosis of SARS-CoV-2 by FDA under an Emergency Use Authorization (EUA). This EUA will remain in effect (meaning this test can be used) for the duration of the COVID-19 declaration under Section 564(b)(1) of the Act, 21 U.S.C. section 360bbb-3(b)(1), unless the authorization is terminated or revoked.  Performed at [facility], [address], [place], [place] [address]   Blood Culture (routine x 2)     Status: None (Preliminary result)   Collection Time: 09/08/23 10:37 PM   Specimen:  BLOOD  Result Value Ref Range Status   Specimen Description BLOOD BLOOD RIGHT ARM  Final   Special Requests NONE  Final   Culture   Final    NO GROWTH 4 DAYS Performed at [facility], [address], [place], [place] [address]    Report Status PENDING  Incomplete  Blood Culture (routine x 2)     Status: None (Preliminary result)   Collection Time: 09/08/23 10:37 PM   Specimen: BLOOD  Result Value Ref Range Status   Specimen Description BLOOD BLOOD RIGHT HAND  Final   Special Requests NONE  Final   Culture   Final    NO GROWTH 4 DAYS Performed at [facility], [address] [place], [place] [address]    Report Status PENDING  Incomplete  Urine Culture     Status: Abnormal   Collection Time: 09/09/23  2:39 AM   Specimen: Urine, Random  Result Value Ref Range Status   Specimen Description   Final    URINE, RANDOM Performed at [facility], [address], [place], [place] [address]    Special Requests   Final    NONE Reflexed from [id number] Performed at [facility], [address], [place], [place] [address]    Culture 30,000 COLONIES/mL ENTEROCOCCUS FAECALIS (A)  Final   Report Status 09/11/2023 FINAL  Final   Organism ID, Bacteria ENTEROCOCCUS FAECALIS (A)  Final      Susceptibility   Enterococcus faecalis - MIC*    AMPICILLIN <=2 SENSITIVE Sensitive     NITROFURANTOIN <=16 SENSITIVE Sensitive     VANCOMYCIN 1 SENSITIVE Sensitive     * 30,000 COLONIES/mL ENTEROCOCCUS FAECALIS    Labs: CBC: Recent Labs  Lab 09/08/23 2215  09/09/23 0754  WBC 10.9* 11.1*  NEUTROABS 8.1*  --   HGB 11.1* 12.2  HCT 34.7* 37.5  MCV 98.6 97.9  PLT 257 257   Basic Metabolic Panel: Recent Labs  Lab 09/08/23 2215 09/09/23 0754 09/10/23 0511 09/11/23 0511 09/12/23 0506  NA 138  --  139 140 137  K 4.4  --  3.4* 3.8 3.3*  CL 102  --  102 102 101  CO2 27  --  29 27 27   GLUCOSE 116*  --  100* 111* 108*  BUN 26*  --  19 23 24*  CREATININE 0.94 0.87 1.00 1.00 1.06*  CALCIUM 9.4  --   8.8* 9.2 8.0*  MG  --  2.0  --   --   --   PHOS  --  3.3  --   --   --    Liver Function Tests: Recent Labs  Lab 09/08/23 2215  AST 17  ALT 11  ALKPHOS 95  BILITOT 0.7  PROT 6.9  ALBUMIN 3.3*   CBG: No results for input(s): "GLUCAP" in the last 168 hours.  Discharge time spent: greater than 30 minutes.  Signed: Vassie Loll, MD [facility] 09/12/2023

## 2023-09-13 LAB — CULTURE, BLOOD (ROUTINE X 2)
Culture: NO GROWTH
Culture: NO GROWTH

## 2023-09-16 DIAGNOSIS — F331 Major depressive disorder, recurrent, moderate: Secondary | ICD-10-CM | POA: Diagnosis not present

## 2023-09-22 DIAGNOSIS — G894 Chronic pain syndrome: Secondary | ICD-10-CM | POA: Diagnosis not present

## 2023-09-22 DIAGNOSIS — I10 Essential (primary) hypertension: Secondary | ICD-10-CM | POA: Diagnosis not present

## 2023-09-22 DIAGNOSIS — I110 Hypertensive heart disease with heart failure: Secondary | ICD-10-CM | POA: Diagnosis not present

## 2023-10-21 ENCOUNTER — Observation Stay (HOSPITAL_COMMUNITY)
Admission: EM | Admit: 2023-10-21 | Discharge: 2023-10-24 | Disposition: A | Discharge status: Hospice | Attending: Internal Medicine | Admitting: Internal Medicine

## 2023-10-21 ENCOUNTER — Emergency Department (HOSPITAL_COMMUNITY)

## 2023-10-21 ENCOUNTER — Encounter (HOSPITAL_COMMUNITY): Payer: Self-pay

## 2023-10-21 ENCOUNTER — Other Ambulatory Visit: Payer: Self-pay

## 2023-10-21 DIAGNOSIS — I2510 Atherosclerotic heart disease of native coronary artery without angina pectoris: Secondary | ICD-10-CM | POA: Insufficient documentation

## 2023-10-21 DIAGNOSIS — I110 Hypertensive heart disease with heart failure: Secondary | ICD-10-CM | POA: Diagnosis not present

## 2023-10-21 DIAGNOSIS — I25118 Atherosclerotic heart disease of native coronary artery with other forms of angina pectoris: Secondary | ICD-10-CM

## 2023-10-21 DIAGNOSIS — Z79899 Other long term (current) drug therapy: Secondary | ICD-10-CM | POA: Diagnosis not present

## 2023-10-21 DIAGNOSIS — S065XAA Traumatic subdural hemorrhage with loss of consciousness status unknown, initial encounter: Secondary | ICD-10-CM | POA: Diagnosis not present

## 2023-10-21 DIAGNOSIS — I10 Essential (primary) hypertension: Secondary | ICD-10-CM

## 2023-10-21 DIAGNOSIS — Z8673 Personal history of transient ischemic attack (TIA), and cerebral infarction without residual deficits: Secondary | ICD-10-CM | POA: Diagnosis not present

## 2023-10-21 DIAGNOSIS — R410 Disorientation, unspecified: Secondary | ICD-10-CM | POA: Insufficient documentation

## 2023-10-21 DIAGNOSIS — E782 Mixed hyperlipidemia: Secondary | ICD-10-CM | POA: Diagnosis not present

## 2023-10-21 DIAGNOSIS — J441 Chronic obstructive pulmonary disease with (acute) exacerbation: Secondary | ICD-10-CM | POA: Insufficient documentation

## 2023-10-21 DIAGNOSIS — I5033 Acute on chronic diastolic (congestive) heart failure: Secondary | ICD-10-CM

## 2023-10-21 DIAGNOSIS — Z7982 Long term (current) use of aspirin: Secondary | ICD-10-CM | POA: Diagnosis not present

## 2023-10-21 DIAGNOSIS — Z87891 Personal history of nicotine dependence: Secondary | ICD-10-CM | POA: Insufficient documentation

## 2023-10-21 DIAGNOSIS — F03C0 Unspecified dementia, severe, without behavioral disturbance, psychotic disturbance, mood disturbance, and anxiety: Secondary | ICD-10-CM

## 2023-10-21 DIAGNOSIS — R0602 Shortness of breath: Secondary | ICD-10-CM | POA: Diagnosis present

## 2023-10-21 DIAGNOSIS — E7800 Pure hypercholesterolemia, unspecified: Secondary | ICD-10-CM

## 2023-10-21 DIAGNOSIS — F0390 Unspecified dementia without behavioral disturbance: Secondary | ICD-10-CM | POA: Diagnosis not present

## 2023-10-21 DIAGNOSIS — Z91040 Latex allergy status: Secondary | ICD-10-CM | POA: Diagnosis not present

## 2023-10-21 DIAGNOSIS — Z1152 Encounter for screening for COVID-19: Secondary | ICD-10-CM | POA: Insufficient documentation

## 2023-10-21 DIAGNOSIS — I6200 Nontraumatic subdural hemorrhage, unspecified: Principal | ICD-10-CM | POA: Insufficient documentation

## 2023-10-21 LAB — TROPONIN I (HIGH SENSITIVITY)
Troponin I (High Sensitivity): 26 ng/L — ABNORMAL HIGH (ref <18)
Troponin I (High Sensitivity): 27 ng/L — ABNORMAL HIGH (ref <18)

## 2023-10-21 LAB — URINALYSIS, W/ REFLEX TO CULTURE (INFECTION SUSPECTED)
Bacteria, UA: NONE SEEN
Bilirubin Urine: NEGATIVE
Glucose, UA: NEGATIVE mg/dL
Hgb urine dipstick: NEGATIVE
Ketones, ur: NEGATIVE mg/dL
Leukocytes,Ua: NEGATIVE
Nitrite: NEGATIVE
Protein, ur: 100 mg/dL — AB
Specific Gravity, Urine: 1.01 (ref 1.005–1.030)
pH: 7 (ref 5.0–8.0)

## 2023-10-21 LAB — RESP PANEL BY RT-PCR (RSV, FLU A&B, COVID)  RVPGX2
Influenza A by PCR: NEGATIVE
Influenza B by PCR: NEGATIVE
Resp Syncytial Virus by PCR: NEGATIVE
SARS Coronavirus 2 by RT PCR: NEGATIVE

## 2023-10-21 LAB — CBC WITH DIFFERENTIAL/PLATELET
Abs Immature Granulocytes: 0.02 10*3/uL (ref 0.00–0.07)
Basophils Absolute: 0.1 10*3/uL (ref 0.0–0.1)
Basophils Relative: 1 %
Eosinophils Absolute: 0.3 10*3/uL (ref 0.0–0.5)
Eosinophils Relative: 3 %
HCT: 35.6 % — ABNORMAL LOW (ref 36.0–46.0)
Hemoglobin: 11.2 g/dL — ABNORMAL LOW (ref 12.0–15.0)
Immature Granulocytes: 0 %
Lymphocytes Relative: 17 %
Lymphs Abs: 1.5 10*3/uL (ref 0.7–4.0)
MCH: 31.9 pg (ref 26.0–34.0)
MCHC: 31.5 g/dL (ref 30.0–36.0)
MCV: 101.4 fL — ABNORMAL HIGH (ref 80.0–100.0)
Monocytes Absolute: 0.8 10*3/uL (ref 0.1–1.0)
Monocytes Relative: 8 %
Neutro Abs: 6.5 10*3/uL (ref 1.7–7.7)
Neutrophils Relative %: 71 %
Platelets: 232 10*3/uL (ref 150–400)
RBC: 3.51 MIL/uL — ABNORMAL LOW (ref 3.87–5.11)
RDW: 13.6 % (ref 11.5–15.5)
WBC: 9.1 10*3/uL (ref 4.0–10.5)
nRBC: 0 % (ref 0.0–0.2)

## 2023-10-21 LAB — BLOOD GAS, ARTERIAL
Acid-Base Excess: 3.4 mmol/L — ABNORMAL HIGH (ref 0.0–2.0)
Bicarbonate: 28.5 mmol/L — ABNORMAL HIGH (ref 20.0–28.0)
Drawn by: 23430
O2 Saturation: 98.3 %
Patient temperature: 36.9
pCO2 arterial: 44 mmHg (ref 32–48)
pH, Arterial: 7.42 (ref 7.35–7.45)
pO2, Arterial: 83 mmHg (ref 83–108)

## 2023-10-21 LAB — BASIC METABOLIC PANEL WITH GFR
Anion gap: 9 (ref 5–15)
BUN: 25 mg/dL — ABNORMAL HIGH (ref 8–23)
CO2: 28 mmol/L (ref 22–32)
Calcium: 9.4 mg/dL (ref 8.9–10.3)
Chloride: 102 mmol/L (ref 98–111)
Creatinine, Ser: 1.01 mg/dL — ABNORMAL HIGH (ref 0.44–1.00)
GFR, Estimated: 56 mL/min — ABNORMAL LOW (ref >60)
Glucose, Bld: 111 mg/dL — ABNORMAL HIGH (ref 70–99)
Potassium: 4.4 mmol/L (ref 3.5–5.1)
Sodium: 139 mmol/L (ref 135–145)

## 2023-10-21 LAB — BRAIN NATRIURETIC PEPTIDE: B Natriuretic Peptide: 1442 pg/mL — ABNORMAL HIGH (ref 0.0–100.0)

## 2023-10-21 LAB — MRSA NEXT GEN BY PCR, NASAL: MRSA by PCR Next Gen: NOT DETECTED

## 2023-10-21 MED ORDER — ONDANSETRON HCL 4 MG/2ML IJ SOLN
4.0000 mg | Freq: Four times a day (QID) | INTRAMUSCULAR | Status: DC | PRN
Start: 2023-10-21 — End: 2023-10-24

## 2023-10-21 MED ORDER — LOSARTAN POTASSIUM 50 MG PO TABS
25.0000 mg | ORAL_TABLET | Freq: Every day | ORAL | Status: DC
  Administered 2023-10-21 – 2023-10-22 (×2): 25 mg via ORAL
  Filled 2023-10-21 (×2): qty 1

## 2023-10-21 MED ORDER — QUETIAPINE FUMARATE 25 MG PO TABS
12.5000 mg | ORAL_TABLET | Freq: Every day | ORAL | Status: DC
  Administered 2023-10-21 – 2023-10-22 (×2): 12.5 mg via ORAL
  Filled 2023-10-21 (×3): qty 1

## 2023-10-21 MED ORDER — ALBUTEROL SULFATE (2.5 MG/3ML) 0.083% IN NEBU: 3.0000 mL | INHALATION_SOLUTION | Freq: Four times a day (QID) | RESPIRATORY_TRACT | Status: DC | PRN

## 2023-10-21 MED ORDER — ATORVASTATIN CALCIUM 40 MG PO TABS
40.0000 mg | ORAL_TABLET | Freq: Every day | ORAL | Status: DC
  Administered 2023-10-22 – 2023-10-24 (×3): 40 mg via ORAL
  Filled 2023-10-21 (×3): qty 1

## 2023-10-21 MED ORDER — ONDANSETRON HCL 4 MG PO TABS: 4.0000 mg | ORAL_TABLET | Freq: Four times a day (QID) | ORAL | Status: DC | PRN

## 2023-10-21 MED ORDER — LABETALOL HCL 5 MG/ML IV SOLN
20.0000 mg | Freq: Once | INTRAVENOUS | Status: AC
  Administered 2023-10-21: 20 mg via INTRAVENOUS
  Filled 2023-10-21: qty 4

## 2023-10-21 MED ORDER — IPRATROPIUM-ALBUTEROL 0.5-2.5 (3) MG/3ML IN SOLN
3.0000 mL | Freq: Once | RESPIRATORY_TRACT | Status: AC
  Administered 2023-10-21: 3 mL via RESPIRATORY_TRACT
  Filled 2023-10-21: qty 3

## 2023-10-21 MED ORDER — HALOPERIDOL LACTATE 5 MG/ML IJ SOLN
1.0000 mg | Freq: Once | INTRAMUSCULAR | Status: AC
  Administered 2023-10-21: 1 mg via INTRAMUSCULAR
  Filled 2023-10-21: qty 1

## 2023-10-21 MED ORDER — VENLAFAXINE HCL 37.5 MG PO TABS
75.0000 mg | ORAL_TABLET | Freq: Two times a day (BID) | ORAL | Status: DC
  Administered 2023-10-21 – 2023-10-24 (×6): 75 mg via ORAL
  Filled 2023-10-21 (×7): qty 2

## 2023-10-21 MED ORDER — ACETAMINOPHEN 650 MG RE SUPP: 650.0000 mg | Freq: Four times a day (QID) | RECTAL | Status: DC | PRN

## 2023-10-21 MED ORDER — FUROSEMIDE 40 MG PO TABS
40.0000 mg | ORAL_TABLET | Freq: Every day | ORAL | Status: DC
  Administered 2023-10-21 – 2023-10-22 (×2): 40 mg via ORAL
  Filled 2023-10-21 (×2): qty 1

## 2023-10-21 MED ORDER — METOPROLOL SUCCINATE ER 50 MG PO TB24
100.0000 mg | ORAL_TABLET | Freq: Every day | ORAL | Status: DC
  Administered 2023-10-21 – 2023-10-24 (×4): 100 mg via ORAL
  Filled 2023-10-21 (×4): qty 2

## 2023-10-21 MED ORDER — ACETAMINOPHEN 325 MG PO TABS
650.0000 mg | ORAL_TABLET | Freq: Four times a day (QID) | ORAL | Status: DC | PRN
  Administered 2023-10-22: 650 mg via ORAL
  Filled 2023-10-21: qty 2

## 2023-10-21 MED ORDER — DONEPEZIL HCL 5 MG PO TABS
10.0000 mg | ORAL_TABLET | Freq: Every day | ORAL | Status: DC
  Administered 2023-10-21 – 2023-10-22 (×2): 10 mg via ORAL
  Filled 2023-10-21 (×3): qty 2

## 2023-10-21 MED ORDER — ORAL CARE MOUTH RINSE: 15.0000 mL | OROMUCOSAL | Status: DC | PRN

## 2023-10-21 NOTE — H&P (Addendum)
 History and Physical    Patient: Connie Wood:[id number] DOB: [birth year] DOA: 10/21/2023 DOS: the patient was seen and examined on 10/21/2023 PCP: [name], NP  Patient coming from: SNF  Chief Complaint:  Chief Complaint  Patient presents with   Shortness of Breath   HPI: Connie Wood is a 83 y.o. female with medical history significant of advanced dementia, hearing loss, hypertension, dyslipidemia, history of CVA and subdural hematoma who was transferred to the ED from SNF due to dyspnea.  On direct questioning patient does not recall why she was brought to the hospital, she does not remember filling ill over the last few days.  She denies headache, nausea or vomiting. No visual changes or recent falls.   I spoke with her son and daughter over the phone and her dementia has been rapidly progressive, she has lost her long and shot term memory.   Recent hospitalization 03/17 to 09/12/23 for congested heart failure.  02/25 ED visit due to fall found bilateral subdural hematomas decision was made to continue conservative care.     Review of Systems: As mentioned in the history of present illness. All other systems reviewed and are negative. Past Medical History:  Diagnosis Date   Back pain    Cat scratch of right forearm 02/03/2014   Dyspnea    Hard of hearing    Hemorrhoid 04/06/2014   History of kidney stones    Hx of degenerative disc disease    Hypercholesterolemia    Hypertension    Migraines    Osteoarthritis    Stroke (HCC)    mini stroke, resolved in 30 minutes   Past Surgical History:  Procedure Laterality Date   ABDOMINAL HYSTERECTOMY     ANTERIOR AND POSTERIOR REPAIR     APPENDECTOMY     BILATERAL SALPINGOOPHORECTOMY     BREAST SURGERY     CARDIAC CATHETERIZATION     CHOLECYSTECTOMY     colonoscopy  2003   Dr. Riley Cheadle: normal rectum, scattered diverticula   COLONOSCOPY WITH PROPOFOL  N/A 01/01/2016   Procedure: COLONOSCOPY WITH  PROPOFOL ;  Surgeon: Suzette Espy, MD;  Location: [facility];  Service: Endoscopy;  Laterality: N/A;  1015   ERCP with sphincterotomy  2003   Dr. Riley Cheadle: balloon dredging of bile duct, normal ampulla, choledocholithiasis    EUS  2016   Dr. Luster Salters: subtle 13X17 mm heterogenous lesion in pancreas neck, FNA negative for malignancy    LEFT HEART CATH AND CORONARY ANGIOGRAPHY N/A 04/23/2018   Procedure: LEFT HEART CATH AND CORONARY ANGIOGRAPHY;  Surgeon: Arty Binning, MD;  Location: [facility];  Service: Cardiovascular;  Laterality: N/A;   POLYPECTOMY  01/01/2016   Procedure: POLYPECTOMY;  Surgeon: Suzette Espy, MD;  Location: [facility];  Service: Endoscopy;;   polypectomies x2-splenic flexure and cecal   TONSILLECTOMY     Social History:  reports that she has quit smoking. Her smoking use included cigarettes. She has a 50 pack-year smoking history. She has never used smokeless tobacco. She reports that she does not drink alcohol and does not use drugs.  Allergies  Allergen Reactions   Adhesive [Tape] Other (See Comments)    Tears skin   Doxycycline  Nausea And Vomiting   Latex Itching and Rash    Family History  Problem Relation Age of Onset   Heart disease Mother    Cancer Father        liver   Cancer Son  Heart disease Son    Cancer Maternal Grandmother    Colon cancer Neg Hx     Prior to Admission medications   Medication Sig Start Date End Date Taking? Authorizing Provider  furosemide  (LASIX ) 40 MG tablet Take 40 mg by mouth daily. 10/09/23  Yes [provider]  Halcinonide (HALOG) 0.1 % CREA Apply 1 Application topically. 09/26/14  Yes [provider]  acetaminophen  (TYLENOL ) 500 MG tablet Take 2 tablets (1,000 mg total) by mouth every 8 (eight) hours as needed for mild pain, moderate pain or headache. Patient taking differently: Take 500 mg by mouth every 6 (six) hours. 04/25/18   Hongalgi, Anand D, MD  albuterol  (PROVENTIL  HFA;VENTOLIN  HFA) 108  (90 Base) MCG/ACT inhaler Inhale 2 puffs into the lungs every 6 (six) hours as needed for wheezing or shortness of breath. 04/25/18   Hongalgi, Anand D, MD  aspirin  81 MG chewable tablet Chew 1 tablet (81 mg total) by mouth daily. 04/25/18   Hongalgi, Anand D, MD  atorvastatin  (LIPITOR ) 40 MG tablet Take 40 mg by mouth daily.    [provider]  Cholecalciferol (VITAMIN D) 50 MCG (2000 UT) CAPS Take 1 capsule by mouth daily.    [provider]  clopidogrel  (PLAVIX ) 75 MG tablet Take 1 tablet (75 mg total) by mouth daily. 04/26/18   Hongalgi, Anand D, MD  donepezil  (ARICEPT ) 10 MG tablet Take 10 mg by mouth daily. 08/14/23   [provider]  fluconazole (DIFLUCAN) 150 MG tablet Take 150 mg by mouth daily as needed (recurrent yeast infections).    [provider]  fluticasone  (FLONASE ) 50 MCG/ACT nasal spray Place 1 spray into both nostrils daily for 14 days. 03/05/20 04/18/20  Avegno, Komlanvi S, FNP  furosemide  (LASIX ) 20 MG tablet Take 2 tablets (40 mg total) by mouth daily. 09/12/23   Justina Oman, MD  HYDROcodone -acetaminophen  (NORCO/VICODIN) 5-325 MG tablet Take 0.5 tablets by mouth every 6 (six) hours as needed for moderate pain (pain score 4-6) or severe pain (pain score 7-10). 08/26/23   [provider]  hydrOXYzine  (VISTARIL ) 50 MG capsule Take 1 capsule by mouth daily.    [provider]  LAGEVRIO 200 MG CAPS capsule Take 4 capsules by mouth 2 (two) times daily. 06/20/23   [provider]  lidocaine  (LIDODERM ) 5 % Place 1 patch onto the skin daily. 08/14/23   [provider]  losartan  (COZAAR ) 25 MG tablet Take 1 tablet (25 mg total) by mouth daily. 09/12/23 09/11/24  Justina Oman, MD  melatonin 3 MG TABS tablet Take 3 mg by mouth at bedtime.    [provider]  metoprolol  succinate (TOPROL -XL) 100 MG 24 hr tablet Take 100 mg by mouth daily. 08/14/23   [provider]  potassium chloride  SA (KLOR-CON  M) 20 MEQ  tablet Take 20 mEq by mouth once.    [provider]  QUEtiapine  (SEROQUEL ) 25 MG tablet Take 12.5 mg by mouth at bedtime. 08/14/23   [provider]  venlafaxine  (EFFEXOR ) 75 MG tablet Take 75 mg by mouth 2 (two) times daily. 08/14/23   [provider]    Physical Exam: Vitals:   10/21/23 0746 10/21/23 0900 10/21/23 1005 10/21/23 1115  BP:  (!) 174/96  (!) 175/96  Pulse:  77  86  Resp:  17  16  Temp:   98.4 F (36.9 C)   TempSrc:   Oral   SpO2: 96% 97%  97%  Weight:      Height:  Neurology awake and alert, upper and lower extremities strength is preserved, normal coordination, ambulation nor assessed. Significant decreased hearing, she is able to read and respond appropriately. Noted very diminished short term and long term memory.  ENT with no pallor Cardiovascular with S1 and S2 present and regular with no gallops, rubs or murmurs No JVD No lower extremity edema Respiratory with no rales or wheezing, no rhonchi Abdomen with no distention  Data Reviewed:    Na 139, K 4,4 Cl 102 bicarbonate 28, glucose 111 bun 25 cr 1,0 BNP 1,442  High sensitive troponin 26 and 27  Wbc 9,1 hgb 11.2 plt 232  Sars covid 19 negative Influenza negative  RSV negative  Urine analysis SG 1,010, protein 100, leukocytes negative, hgb negative   Head CT with chronic left subdural hematoma with progressed loculation and hypodense blood products since February, increased by up to 5 mm thickness (up to 20 mm now), although associated intracranial mass effect with 5 to 6 mm of rightward midline shift is stable.  Right SDH remains low density and smaller, 4-5 mm now.   Chest radiograph with hyperinflation with bilateral hilar vascular congestion and bilateral central interstitial infiltrates  EKG 74 bpm, normal axis, qtc 510, sinus rhythm with poor R R wave progression with no significant ST segment or T wave changes.     Assessment and Plan: * Subdural hematoma  (HCC) Worsening hematoma on the left with no significant neurologic deficits.  Stable midline shift  rightward   I spoke with her son, (POA), considering her advance dementia and poor prognosis, he and family made decision to continue with conservative care and avoid any surgical intervention. Code status is DNR. If patient decompensated from intracranial hypertension, plan is to transition to comfort care. Fall precautions and avoid anticoagulants and antiplatelet therapy  Neuro checks q 4 hrs  PT and OT   Acute on chronic diastolic CHF (congestive heart failure) (HCC) Continue blood pressure monitoring Continue furosemide  po 40 mg po daily  Continue with losartan  and metoprolol .  Avoid hypotension,    Hypertension Continue blood pressure control with losartan  and metoprolol .  CAD (coronary artery disease) Mild troponin elevation due to decompensated heart failure Patient with no chest pain, ruled out acute coronary syndrome  Continue statin, no antiplatelet therapy.   Hypercholesteremia Continue statin therapy   Dementia (HCC) Decreased hearing Very poor prognosis due to advanced disease Patient will need sitter to prevent further agitation,.  Avoid haldol due to prolonged qtc  Continue with quetiapine  and venlafaxine         Advance Care Planning:   Code Status: Limited: Do not attempt resuscitation (DNR) -DNR-LIMITED -Do Not Intubate/DNI    Consults: none (neurosurgery in the ED)   Family Communication: I spoke with patient's daughter and son over the phone we talked in detail about patient's condition, plan of care and prognosis and all questions were addressed.   Severity of Illness: The appropriate patient status for this patient is OBSERVATION. Observation status is judged to be reasonable and necessary in order to provide the required intensity of service to ensure the patient's safety. The patient's presenting symptoms, physical exam findings, and initial  radiographic and laboratory data in the context of their medical condition is felt to place them at decreased risk for further clinical deterioration. Furthermore, it is anticipated that the patient will be medically stable for discharge from the hospital within 2 midnights of admission.   Author: Albertus Alt, MD 10/21/2023 11:56 AM  For  on call review [email or web address].

## 2023-10-21 NOTE — Assessment & Plan Note (Signed)
Continue blood pressure control with losartan and metoprolol.  ?

## 2023-10-21 NOTE — ED Notes (Signed)
 Spoke to nurse at [facility]. She reports patient was normal when they checked on her at around 0300/0330. Found patient altered, "in and out of it", and labored breathing around 0500. Nurse denies any notes of recent falls at facility

## 2023-10-21 NOTE — Assessment & Plan Note (Signed)
 Worsening hematoma on the left with no significant neurologic deficits.  Stable midline shift  rightward   I spoke with her son, (POA), considering her advance dementia and poor prognosis, he and family made decision to continue with conservative care and avoid any surgical intervention. Code status is DNR. If patient decompensated from intracranial hypertension, plan is to transition to comfort care. Fall precautions and avoid anticoagulants and antiplatelet therapy  Neuro checks q 4 hrs  PT and OT

## 2023-10-21 NOTE — ED Notes (Signed)
 Patient transported to CT

## 2023-10-21 NOTE — ED Provider Notes (Signed)
 Signout from Dr. Polina.  83 year old female nursing home resident sent in for increased confusion and shortness of breath.  Of note she had a fall in February that showed a subdural, treated nonoperatively.  CT today showing some evolution of that although is also increased and thickening.  Neurosurgery [facility] consulted.  The recommendation was possible intervention if family was interested in that.  Will need admission.  Awaiting callback from family to see if patient would be better served being at [facility] for neurosurgery evaluation. Physical Exam  BP 110/74 (BP Location: Left Arm)   Pulse 89   Temp 98.5 F (36.9 C) (Oral)   Resp 20   Ht 5\' 3"  (1.6 m)   Wt 53.9 kg   SpO2 96%   BMI 21.05 kg/m   Physical Exam  Procedures  Procedures  ED Course / MDM    Medical Decision Making Amount and/or Complexity of Data Reviewed Labs: ordered. Radiology: ordered.  Risk Prescription drug management.   Family has not called back.  Her workup does not show a clear UTI.  She is arousable to voice and nonfocal but not capable of making decisions.  Discussed with [facility]  hospitalist Dr. Sunnie England who will evaluate patient for admission.       Tonya Fredrickson, MD 10/21/23 [phone]

## 2023-10-21 NOTE — Assessment & Plan Note (Addendum)
 Continue blood pressure monitoring Continue furosemide  po 40 mg po daily  Continue with losartan  and metoprolol .  Avoid hypotension,

## 2023-10-21 NOTE — TOC Initial Note (Signed)
 Transition of Care [facility]) - Initial/Assessment Note    Patient Details  Name: Connie Wood MRN: [id number] Date of Birth: [birth year]  [facility] CM/SW Contact:    Geraldina Klinefelter, RN Phone Number: 10/21/2023, 9:35 PM  Clinical Narrative:                 Pt from [facility] ALF, memory care unit. No family at bedside at time of assessment. Pt is confused and hard of hearing. Hx of dementia. Per chart, pt has worsening bilateral subdural hematomas. Family wishes to continue conservative care, no surgery. Discharge plan is to return to [facility].   Expected Discharge Plan: Assisted Living Barriers to Discharge: Continued Medical Work up   Patient Goals and CMS Choice Patient states their goals for this hospitalization and ongoing recovery are:: Pt is pleasantly confused.  Expected Discharge Plan and Services In-house Referral: Clinical Social Work Discharge Planning Services: CM Consult   Living arrangements for the past 2 months: Assisted Living Facility   Prior Living Arrangements/Services Living arrangements for the past 2 months: Assisted Living Facility Lives with:: Facility Resident    Need for Family Participation in Patient Care: Yes (Comment) Care giver support system in place?: Yes (comment)   Criminal Activity/Legal Involvement Pertinent to Current Situation/Hospitalization: No - Comment as needed  Activities of Daily Living   ADL Screening (condition at time of admission) Independently performs ADLs?: No Does the patient have a NEW difficulty with bathing/dressing/toileting/self-feeding that is expected to last >3 days?: No Does the patient have a NEW difficulty with getting in/out of bed, walking, or climbing stairs that is expected to last >3 days?: No Does the patient have a NEW difficulty with communication that is expected to last >3 days?: No Is the patient deaf or have difficulty hearing?: Yes Does the patient have difficulty seeing, even  when wearing glasses/contacts?: No Does the patient have difficulty concentrating, remembering, or making decisions?: Yes  Permission Sought/Granted  Emotional Assessment Appearance:: Appears older than stated age Attitude/Demeanor/Rapport: Engaged Affect (typically observed): Pleasant Orientation: : Oriented to Self Alcohol / Substance Use: Not Applicable Psych Involvement: No (comment)  Admission diagnosis:  Confusion [R41.0] Subdural hematoma (HCC) [S06.5XAA] SDH (subdural hematoma) (HCC) [S06.5XAA] COPD exacerbation (HCC) [J44.1] Patient Active Problem List   Diagnosis Date Noted   Subdural hematoma (HCC) 10/21/2023   Acute on chronic diastolic CHF (congestive heart failure) (HCC) 10/21/2023   Dementia (HCC) 10/21/2023   Acute respiratory failure with hypoxia (HCC) 09/09/2023   Mixed conductive and sensorineural hearing loss of both ears 07/17/2023   CAD (coronary artery disease) 05/03/2018   Elevated troponin 05/03/2018   Anemia 05/03/2018   NSTEMI (non-ST elevated myocardial infarction) (HCC) 04/23/2018   AKI (acute kidney injury) (HCC) 04/23/2018   Obesity (BMI 30-39.9) 04/23/2018   Acute on chronic respiratory failure with hypoxia (HCC) 04/23/2018   Hypotension 04/23/2018   Abnormal cardiac enzyme level    IBS (irritable bowel syndrome) 01/06/2017   Constipation 07/17/2016   History of colonic polyps    Tobacco abuse 09/28/2014   GERD (gastroesophageal reflux disease) 09/28/2014   Depression 09/28/2014   Chronic back pain 09/28/2014   Anxiety 09/28/2014   Common bile duct dilation 08/24/2014   H/O acute pancreatitis 07/11/2014   Hemorrhoid 04/06/2014   Hypertension 02/03/2014   Hypercholesteremia 02/03/2014   Dyspnea 07/12/2013   Chronic bronchitis (HCC) 07/11/2013   Acute bronchitis 07/11/2013   Nonhealing nonsurgical wound 04/05/2013   Stasis edema 04/05/2013   PCP:  [facility] Half,  Melton Squires, NP Pharmacy:   [facility] [place] [place] - [address]  [place] [address] Phone: [phone] Fax: [phone]  [facility] - [place], [place] - [place] [address] [place] [address] Phone: [phone] Fax: [phone]     Social Drivers of Health (SDOH) Social History: SDOH Screenings   Food Insecurity: No Food Insecurity (10/21/2023)  Housing: Low Risk  (10/21/2023)  Transportation Needs: No Transportation Needs (10/21/2023)  Utilities: Not At Risk (10/21/2023)  Social Connections: Patient Unable To Answer (10/21/2023)  Tobacco Use: Medium Risk (10/21/2023)   SDOH Interventions:  Readmission Risk Interventions    09/12/2023    1:27 PM  Readmission Risk Prevention Plan  Transportation Screening Complete  PCP or Specialist Appt within 5-7 Days Not Complete  Home Care Screening Complete  Medication Review (RN CM) Complete

## 2023-10-21 NOTE — Assessment & Plan Note (Signed)
 Mild troponin elevation due to decompensated heart failure Patient with no chest pain, ruled out acute coronary syndrome  Continue statin, no antiplatelet therapy.

## 2023-10-21 NOTE — Plan of Care (Signed)
  Problem: Education: Goal: Knowledge of General Education information will improve Description: Including pain rating scale, medication(s)/side effects and non-pharmacologic comfort measures Outcome: Not Progressing   Problem: Health Behavior/Discharge Planning: Goal: Ability to manage health-related needs will improve Outcome: Not Progressing   Problem: Clinical Measurements: Goal: Ability to maintain clinical measurements within normal limits will improve Outcome: Progressing Goal: Will remain free from infection Outcome: Progressing Goal: Diagnostic test results will improve Outcome: Not Progressing Goal: Respiratory complications will improve Outcome: Progressing Goal: Cardiovascular complication will be avoided Outcome: Not Progressing   Problem: Activity: Goal: Risk for activity intolerance will decrease Outcome: Progressing   Problem: Nutrition: Goal: Adequate nutrition will be maintained Outcome: Progressing   Problem: Coping: Goal: Level of anxiety will decrease Outcome: Not Progressing   Problem: Elimination: Goal: Will not experience complications related to bowel motility Outcome: Progressing Goal: Will not experience complications related to urinary retention Outcome: Progressing   Problem: Pain Managment: Goal: General experience of comfort will improve and/or be controlled Outcome: Progressing   Problem: Safety: Goal: Ability to remain free from injury will improve Outcome: Progressing   Problem: Skin Integrity: Goal: Risk for impaired skin integrity will decrease Outcome: Progressing

## 2023-10-21 NOTE — ED Provider Notes (Addendum)
 [place] EMERGENCY DEPARTMENT AT [facility] Provider Note   CSN: [id number] Arrival date & time: 10/21/23  0546     History  Chief Complaint  Patient presents with   Shortness of Breath    Connie Wood is a 83 y.o. female.  Patient sent to the emergency department from skilled nursing facility.  Nursing home report that she seemed to be her normal self around 3 AM.  Upon recheck around 5 AM, she was noted to be altered, "in and out of it", as well as short of breath.  Blood pressures have been running high.  At arrival to the ED, patient reports that she does not feel short of breath and is not experiencing any pain.       Home Medications Prior to Admission medications   Medication Sig Start Date End Date Taking? Authorizing Provider  acetaminophen  (TYLENOL ) 500 MG tablet Take 2 tablets (1,000 mg total) by mouth every 8 (eight) hours as needed for mild pain, moderate pain or headache. Patient taking differently: Take 500 mg by mouth every 6 (six) hours. 04/25/18   Hongalgi, Anand D, MD  albuterol  (PROVENTIL  HFA;VENTOLIN  HFA) 108 (90 Base) MCG/ACT inhaler Inhale 2 puffs into the lungs every 6 (six) hours as needed for wheezing or shortness of breath. 04/25/18   Hongalgi, Anand D, MD  aspirin  81 MG chewable tablet Chew 1 tablet (81 mg total) by mouth daily. 04/25/18   Hongalgi, Anand D, MD  atorvastatin  (LIPITOR ) 40 MG tablet Take 40 mg by mouth daily.    [provider]  Cholecalciferol (VITAMIN D) 50 MCG (2000 UT) CAPS Take 1 capsule by mouth daily.    [provider]  clopidogrel  (PLAVIX ) 75 MG tablet Take 1 tablet (75 mg total) by mouth daily. 04/26/18   Hongalgi, Anand D, MD  donepezil  (ARICEPT ) 10 MG tablet Take 10 mg by mouth daily. 08/14/23   [provider]  fluconazole (DIFLUCAN) 150 MG tablet Take 150 mg by mouth daily as needed (recurrent yeast infections).    [provider]  fluticasone  (FLONASE ) 50 MCG/ACT nasal  spray Place 1 spray into both nostrils daily for 14 days. 03/05/20 04/18/20  Avegno, Komlanvi S, FNP  furosemide  (LASIX ) 20 MG tablet Take 2 tablets (40 mg total) by mouth daily. 09/12/23   Justina Oman, MD  HYDROcodone -acetaminophen  (NORCO/VICODIN) 5-325 MG tablet Take 0.5 tablets by mouth every 6 (six) hours as needed for moderate pain (pain score 4-6) or severe pain (pain score 7-10). 08/26/23   [provider]  hydrOXYzine  (VISTARIL ) 50 MG capsule Take 1 capsule by mouth daily.    [provider]  LAGEVRIO 200 MG CAPS capsule Take 4 capsules by mouth 2 (two) times daily. 06/20/23   [provider]  lidocaine  (LIDODERM ) 5 % Place 1 patch onto the skin daily. 08/14/23   [provider]  losartan  (COZAAR ) 25 MG tablet Take 1 tablet (25 mg total) by mouth daily. 09/12/23 09/11/24  Justina Oman, MD  melatonin 3 MG TABS tablet Take 3 mg by mouth at bedtime.    [provider]  metoprolol  succinate (TOPROL -XL) 100 MG 24 hr tablet Take 100 mg by mouth daily. 08/14/23   [provider]  potassium chloride  SA (KLOR-CON  M) 20 MEQ tablet Take 20 mEq by mouth once.    [provider]  QUEtiapine  (SEROQUEL ) 25 MG tablet Take 12.5 mg by mouth at bedtime. 08/14/23   [provider]  venlafaxine  (EFFEXOR ) 75 MG tablet  Take 75 mg by mouth 2 (two) times daily. 08/14/23   [provider]      Allergies    Adhesive [tape], Doxycycline , and Latex    Review of Systems   Review of Systems  Physical Exam Updated Vital Signs BP 110/74 (BP Location: Left Arm)   Pulse 89   Temp 98.5 F (36.9 C) (Oral)   Resp 20   Ht 5\' 3"  (1.6 m)   Wt 53.9 kg   SpO2 94%   BMI 21.05 kg/m  Physical Exam Vitals and nursing note reviewed.  Constitutional:      General: She is not in acute distress.    Appearance: She is well-developed.  HENT:     Head: Normocephalic and atraumatic.     Ears:     Comments: Extremely hard of hearing     Mouth/Throat:     Mouth: Mucous membranes are moist.  Eyes:     General: Vision grossly intact. Gaze aligned appropriately.     Extraocular Movements: Extraocular movements intact.     Conjunctiva/sclera: Conjunctivae normal.  Cardiovascular:     Rate and Rhythm: Normal rate and regular rhythm.     Pulses: Normal pulses.     Heart sounds: Normal heart sounds, S1 normal and S2 normal. No murmur heard.    No friction rub. No gallop.  Pulmonary:     Effort: Tachypnea and accessory muscle usage present. No respiratory distress.     Breath sounds: Normal breath sounds.  Abdominal:     General: Bowel sounds are normal.     Palpations: Abdomen is soft.     Tenderness: There is no abdominal tenderness. There is no guarding or rebound.     Hernia: No hernia is present.  Musculoskeletal:        General: No swelling.     Cervical back: Full passive range of motion without pain, normal range of motion and neck supple. No spinous process tenderness or muscular tenderness. Normal range of motion.     Right lower leg: No edema.     Left lower leg: No edema.  Skin:    General: Skin is warm and dry.     Capillary Refill: Capillary refill takes less than 2 seconds.     Findings: No ecchymosis, erythema, rash or wound.  Neurological:     General: No focal deficit present.     Mental Status: She is alert and oriented to person, place, and time.     GCS: GCS eye subscore is 4. GCS verbal subscore is 5. GCS motor subscore is 6.     Cranial Nerves: Cranial nerves 2-12 are intact.     Sensory: Sensation is intact.     Motor: Motor function is intact.     Coordination: Coordination is intact.  Psychiatric:        Attention and Perception: Attention normal.        Mood and Affect: Mood normal.        Speech: Speech normal.        Behavior: Behavior normal.     ED Results / Procedures / Treatments   Labs (all labs ordered are listed, but only abnormal results are displayed) Labs Reviewed  CBC  WITH DIFFERENTIAL/PLATELET - Abnormal; Notable for the following components:      Result Value   RBC 3.51 (*)    Hemoglobin 11.2 (*)    HCT 35.6 (*)    MCV 101.4 (*)    All other components within  normal limits  BASIC METABOLIC PANEL WITH GFR - Abnormal; Notable for the following components:   Glucose, Bld 111 (*)    BUN 25 (*)    Creatinine, Ser 1.01 (*)    GFR, Estimated 56 (*)    All other components within normal limits  BRAIN NATRIURETIC PEPTIDE - Abnormal; Notable for the following components:   B Natriuretic Peptide 1,442.0 (*)    All other components within normal limits  TROPONIN I (HIGH SENSITIVITY) - Abnormal; Notable for the following components:   Troponin I (High Sensitivity) 26 (*)    All other components within normal limits  RESP PANEL BY RT-PCR (RSV, FLU A&B, COVID)  RVPGX2  URINALYSIS, W/ REFLEX TO CULTURE (INFECTION SUSPECTED)  BLOOD GAS, ARTERIAL    EKG None  Radiology DG Chest Port 1 View Result Date: 10/21/2023 CLINICAL DATA:  Shortness of breath. EXAM: PORTABLE CHEST 1 VIEW COMPARISON:  09/08/2023 FINDINGS: Diffuse interstitial prominence again noted with a basilar predominance, right greater than left. No dense focal consolidative airspace disease or substantial pleural effusion. The cardiopericardial silhouette is within normal limits for size. No acute bony abnormality. Telemetry leads overlie the chest. IMPRESSION: Diffuse interstitial prominence with a basilar predominance, right greater than left. While likely chronic, imaging features could be related to edema or infection. Electronically Signed   By: Donnal Fusi M.D.   On: 10/21/2023 06:30   CT HEAD WO CONTRAST ( ) Result Date: 10/21/2023 CLINICAL DATA:  83 year old female found with altered mental status, shortness of breath. EXAM: CT HEAD WITHOUT CONTRAST TECHNIQUE: Contiguous axial images were obtained from the base of the skull through the vertex without intravenous contrast. RADIATION DOSE  REDUCTION: This exam was performed according to the departmental dose-optimization program which includes automated exposure control, adjustment of the mA and/or kV according to patient size and/or use of iterative reconstruction technique. COMPARISON:  Head CT 07/26/2023. FINDINGS: Brain: Left greater than right low-density subdural hematoma in February, left side subdural now septated, intermittently hyperdense, and larger measuring up to 2 cm in thickness (previously 14-15 mm (coronal image 28). Contralateral right side subdural remains hypodense and appears smaller, 4-5 mm (versus 8-9 mm previously). Intracranial mass effect with rightward midline shift not significantly changed, 5-6 mm (series 2, image 18 today versus series 3, image 19 previously). Stable partially effaced left lateral ventricle. Basilar cisterns remain patent. No new areas of intracranial hemorrhage. Stable gray-white matter differentiation throughout the brain. No cortically based acute infarct identified. Widespread cerebral white matter hypodensity including deep white matter capsule involvement. Vascular: Generalized intracranial artery tortuosity. Calcified atherosclerosis at the skull base. No suspicious intracranial vascular hyperdensity. Skull: Stable and intact. Sinuses/Orbits: Improved left middle ear and mastoid aeration since February. Otherwise stable paranasal sinuses, right middle ear and mastoid aeration. Other: No acute orbit or scalp soft tissue finding. IMPRESSION: 1. Chronic Left subdural Hematoma with progressed loculation and hyperdense blood products since February, increased by up to 5 mm thickness (up to 20 mm now), although associated intracranial mass effect with 5-6 mm of rightward midline shift is stable. 2. Right side SDH remains low-density and is smaller, 4-5 mm now. 3. No other acute intracranial abnormality. Chronic white matter disease, intracranial artery tortuosity. Electronically Signed   By: Marlise Simpers M.D.    On: 10/21/2023 06:21    Procedures Procedures    Medications Ordered in ED Medications  ipratropium-albuterol  (DUONEB) 0.5-2.5 (3) MG/3ML nebulizer solution 3 mL (has no administration in time range)  labetalol (NORMODYNE) injection 20 mg (  20 mg Intravenous Given 10/21/23 1610)    ED Course/ Medical Decision Making/ A&P                                 Medical Decision Making Amount and/or Complexity of Data Reviewed Labs: ordered. Radiology: ordered.  Risk Prescription drug management.   Differential diagnosis considered includes, but not limited to: TIA; Stroke; ICH; Seizure; electrolyte abnormality; hypoglycemia; toxic/pharmacologic causes; CNS infection; psychiatric disorder; COPD exacerbation; CHF; pneumonia  Patient sent to the emergency department for evaluation of altered mental status and shortness of breath.  Patient is known to have COPD/asthma.  She did have a hospitalization last month for respiratory failure requiring supplemental oxygen .  She appears tachypneic and has some increased work of breathing upon arrival to the ED.  She, however, reports that she does not sense this.  She does have a history of dementia.  She has also extremely hard of hearing, difficult to interview.  CT findings discussed with Arlis Bent, NP of neurosurgery.  If family wants to pursue intervention, patient should be admitted to the hospitalist service at [facility] and they will consult.  Obtain blood pressure control, systolic blood pressure less than 160.  I did attempt to contact her son Rodman Clam and her daughter Odilia Bennett, was unable to reach either at this point.  Will continue to attempt to connect.  Further workup for mental status changes in process.  Suspect that her hypoxia is because of sudden mental status changes.  Will check ABG to rule out hypercarbia.  Will need urinalysis to rule out UTI as a cause of encephalopathy.  Will sign out to oncoming ER physician to follow these  results  CRITICAL CARE Performed by: Ballard Bongo   Total critical care time: 30 minutes  Critical care time was exclusive of separately billable procedures and treating other patients.  Critical care was necessary to treat or prevent imminent or life-threatening deterioration.  Critical care was time spent personally by me on the following activities: development of treatment plan with patient and/or surrogate as well as nursing, discussions with consultants, evaluation of patient's response to treatment, examination of patient, obtaining history from patient or surrogate, ordering and performing treatments and interventions, ordering and review of laboratory studies, ordering and review of radiographic studies, pulse oximetry and re-evaluation of patient's condition.           Final Clinical Impression(s) / ED Diagnoses Final diagnoses:  SDH (subdural hematoma) (HCC)  COPD exacerbation (HCC)  Confusion    Rx / DC Orders ED Discharge Orders     None         Binyomin Brann, Marine Sia, MD 10/21/23 [id number]    Ballard Bongo, MD 10/21/23 [phone]

## 2023-10-21 NOTE — Assessment & Plan Note (Addendum)
 Decreased hearing Very poor prognosis due to advanced disease Patient will need sitter to prevent further agitation,.  Avoid haldol due to prolonged qtc  Continue with quetiapine  and venlafaxine 

## 2023-10-21 NOTE — Progress Notes (Signed)
 I spoke with patient's daughter about worsening intracranial hematoma. Associated intracranial mass effect with 5-6 mm, of rightward midline shift stable. Per neurosurgery this will potentially require surgical intervention.  She will call the rest of the family and let the team know if they want to continue conservative management or surgical intervention.  Will wait answer before admitting to [facility] or [facility].

## 2023-10-21 NOTE — ED Triage Notes (Signed)
 Patient from [facility] for shortness of breath. EMS reports staff stated that patient was found SOB and "in and out of it" during their rounds. EMS reports patient SpO2 was 92% on RA, so they placed patient on 2L of O2 with improvement. Upon arrival to ER, patient is alert, very hard of hearing. Denies any pain or shortness of breath. Patient has increased work of breathing and is tachypneic

## 2023-10-21 NOTE — Assessment & Plan Note (Signed)
 Continue statin therapy.

## 2023-10-22 ENCOUNTER — Encounter (HOSPITAL_COMMUNITY): Payer: Self-pay | Admitting: Internal Medicine

## 2023-10-22 DIAGNOSIS — Z7189 Other specified counseling: Secondary | ICD-10-CM

## 2023-10-22 DIAGNOSIS — M25551 Pain in right hip: Secondary | ICD-10-CM | POA: Diagnosis not present

## 2023-10-22 DIAGNOSIS — S72141A Displaced intertrochanteric fracture of right femur, initial encounter for closed fracture: Secondary | ICD-10-CM | POA: Diagnosis not present

## 2023-10-22 DIAGNOSIS — Z515 Encounter for palliative care: Secondary | ICD-10-CM

## 2023-10-22 DIAGNOSIS — S065XAA Traumatic subdural hemorrhage with loss of consciousness status unknown, initial encounter: Secondary | ICD-10-CM | POA: Diagnosis not present

## 2023-10-22 LAB — BASIC METABOLIC PANEL WITH GFR
Anion gap: 10 (ref 5–15)
BUN: 19 mg/dL (ref 8–23)
CO2: 25 mmol/L (ref 22–32)
Calcium: 9.1 mg/dL (ref 8.9–10.3)
Chloride: 103 mmol/L (ref 98–111)
Creatinine, Ser: 0.95 mg/dL (ref 0.44–1.00)
GFR, Estimated: 60 mL/min — ABNORMAL LOW (ref >60)
Glucose, Bld: 114 mg/dL — ABNORMAL HIGH (ref 70–99)
Potassium: 3.4 mmol/L — ABNORMAL LOW (ref 3.5–5.1)
Sodium: 138 mmol/L (ref 135–145)

## 2023-10-22 LAB — CBC
HCT: 32.6 % — ABNORMAL LOW (ref 36.0–46.0)
Hemoglobin: 10.2 g/dL — ABNORMAL LOW (ref 12.0–15.0)
MCH: 30.9 pg (ref 26.0–34.0)
MCHC: 31.3 g/dL (ref 30.0–36.0)
MCV: 98.8 fL (ref 80.0–100.0)
Platelets: 225 10*3/uL (ref 150–400)
RBC: 3.3 MIL/uL — ABNORMAL LOW (ref 3.87–5.11)
RDW: 13.6 % (ref 11.5–15.5)
WBC: 9.9 10*3/uL (ref 4.0–10.5)
nRBC: 0 % (ref 0.0–0.2)

## 2023-10-22 MED ORDER — HYDRALAZINE HCL 20 MG/ML IJ SOLN: 10.0000 mg | INTRAMUSCULAR | Status: DC | PRN

## 2023-10-22 MED ORDER — POTASSIUM CHLORIDE CRYS ER 20 MEQ PO TBCR
40.0000 meq | EXTENDED_RELEASE_TABLET | Freq: Two times a day (BID) | ORAL | Status: AC
  Administered 2023-10-22 (×2): 40 meq via ORAL
  Filled 2023-10-22 (×2): qty 2

## 2023-10-22 NOTE — TOC Initial Note (Signed)
 Transition of Care [facility]) - Initial/Assessment Note    Patient Details  Name: Connie Wood MRN: [id number] Date of Birth: [birth year]  [facility] of [facility] CM/SW Contact:    Orelia Binet, RN Phone Number: 10/22/2023, 2:38 PM  Clinical Narrative:   Patient admitted with Subdural hematoma, from [facility] ALF. Palliative meeting with family.  Family has discuss a bed with [facility]. CM spoke with Admission at [facility]. Patient can come under hospice as discuss with family or if recommended for SNF she can come with [facility].  PT eval ordered. TOC following.                 Expected Discharge Plan: Skilled Nursing Facility Barriers to Discharge: Continued Medical Work up   Patient Goals and CMS Choice Patient states their goals for this hospitalization and ongoing recovery are:: family want SNF CMS Medicare.gov Compare Post Acute Care list provided to:: Patient Represenative (must comment) Choice offered to / list presented to : Adult Children      Expected Discharge Plan and Services In-house Referral: Clinical Social Work Discharge Planning Services: CM Consult   Living arrangements for the past 2 months: Assisted Living Facility               Prior Living Arrangements/Services Living arrangements for the past 2 months: Assisted Living Facility Lives with:: Facility Resident          Need for Family Participation in Patient Care: Yes (Comment) Care giver support system in place?: Yes (comment)   Criminal Activity/Legal Involvement Pertinent to Current Situation/Hospitalization: No - Comment as needed  Activities of Daily Living   ADL Screening (condition at time of admission) Independently performs ADLs?: No Does the patient have a NEW difficulty with bathing/dressing/toileting/self-feeding that is expected to last >3 days?: No Does the patient have a NEW difficulty with getting in/out of bed, walking, or climbing stairs that is expected to last >3 days?:  No Does the patient have a NEW difficulty with communication that is expected to last >3 days?: No Is the patient deaf or have difficulty hearing?: Yes Does the patient have difficulty seeing, even when wearing glasses/contacts?: No Does the patient have difficulty concentrating, remembering, or making decisions?: Yes  Permission Sought/Granted                  Emotional Assessment Appearance:: Appears older than stated age Attitude/Demeanor/Rapport: Engaged Affect (typically observed): Pleasant Orientation: : Oriented to Self Alcohol / Substance Use: Not Applicable Psych Involvement: No (comment)  Admission diagnosis:  Confusion [R41.0] Subdural hematoma (HCC) [S06.5XAA] SDH (subdural hematoma) (HCC) [S06.5XAA] COPD exacerbation (HCC) [J44.1] Patient Active Problem List   Diagnosis Date Noted   Subdural hematoma (HCC) 10/21/2023   Acute on chronic diastolic CHF (congestive heart failure) (HCC) 10/21/2023   Dementia (HCC) 10/21/2023   Acute respiratory failure with hypoxia (HCC) 09/09/2023   Mixed conductive and sensorineural hearing loss of both ears 07/17/2023   CAD (coronary artery disease) 05/03/2018   Elevated troponin 05/03/2018   Anemia 05/03/2018   NSTEMI (non-ST elevated myocardial infarction) (HCC) 04/23/2018   AKI (acute kidney injury) (HCC) 04/23/2018   Obesity (BMI 30-39.9) 04/23/2018   Acute on chronic respiratory failure with hypoxia (HCC) 04/23/2018   Hypotension 04/23/2018   Abnormal cardiac enzyme level    IBS (irritable bowel syndrome) 01/06/2017   Constipation 07/17/2016   History of colonic polyps    Tobacco abuse 09/28/2014   GERD (gastroesophageal reflux disease) 09/28/2014   Depression 09/28/2014  Chronic back pain 09/28/2014   Anxiety 09/28/2014   Common bile duct dilation 08/24/2014   H/O acute pancreatitis 07/11/2014   Hemorrhoid 04/06/2014   Hypertension 02/03/2014   Hypercholesteremia 02/03/2014   Dyspnea 07/12/2013   Chronic  bronchitis (HCC) 07/11/2013   Acute bronchitis 07/11/2013   Nonhealing nonsurgical wound 04/05/2013   Stasis edema 04/05/2013   PCP:  [name], NP Pharmacy:   [facility] [place] [place], [place] - [address] [place] [address] Phone: [phone] Fax: [phone]  [facility] - [place], [place] - [place] [address] [place] [address] Phone: [phone] Fax: [phone]     Social Drivers of Health (SDOH) Social History: SDOH Screenings   Food Insecurity: No Food Insecurity (10/21/2023)  Housing: Low Risk  (10/21/2023)  Transportation Needs: No Transportation Needs (10/21/2023)  Utilities: Not At Risk (10/21/2023)  Social Connections: Patient Unable To Answer (10/21/2023)  Tobacco Use: Medium Risk (10/22/2023)   SDOH Interventions:     Readmission Risk Interventions    09/12/2023    1:27 PM  Readmission Risk Prevention Plan  Transportation Screening Complete  PCP or Specialist Appt within 5-7 Days Not Complete  Home Care Screening Complete  Medication Review (RN CM) Complete

## 2023-10-22 NOTE — Consult Note (Signed)
 Consultation Note Date: 10/22/2023   Patient Name: Connie Wood  DOB: 1941-05-11  MRN: [id number]  Age / Sex: 83 y.o., female  PCP: [name], NP Referring Physician: Cornelius Dill, DO  Reason for Consultation: Establishing goals of care  HPI/Patient Profile: 83 y.o. female  with past medical history of advanced dementia, hearing loss, hypertension, dyslipidemia, history of CVA and subdural hematoma  admitted on 10/21/2023 with subdural hematoma on the left with no significant neurological deficits and stable midline shift rightward.   Clinical Assessment and Goals of Care: I have reviewed medical records including EPIC notes, labs and imaging, received report from RN, assessed the patient.  Connie Wood is lying quietly in bed.  She appears frail and elderly.  She is resting comfortably, but wakes easily when I touch her arm.  She has known dementia and hearing loss.  I do not ask orientation questions.  I am not sure she can make her basic needs known.  There is no family at bedside at this time although a Recruitment consultant is present.  Call to son, Connie Wood, to discuss diagnosis prognosis, GOC, EOL wishes, disposition and options.  I introduced Palliative Medicine as specialized medical care for people living with serious illness. It focuses on providing relief from the symptoms and stress of a serious illness. The goal is to improve quality of life for both the patient and the family.  We discussed a brief life review of the patient.  Connie Wood shares that his mother has been having advancing dementia he feels that she is now in middle late stages.  He shares a story of fast decline.  He shares that although she had been living at [facility] they feel that she needs more care and have secured a bed for her at [facility] in [place].  He states that the plan was for her to transfer there  next week.  He states that [facility] is aware and has a bed ready for her now.  We then focused on their current illness.  We talked about Mrs. Parrow's acute health concerns and the treatment plan.  Connie Wood verifies that they have elected no aggressive treatments.  Hospice and Palliative Care services outpatient were explained and offered due to the acute and serious nature of Mrs. Curto's injury.  At this point Connie Wood states they would "see how she does" before choosing palliative or hospice care.  He states that he would like to have this discussion with the provider/worker at [facility].  I encouraged him to work closely with that team.  The natural disease trajectory and expectations at EOL were discussed.  Advanced directives, concepts specific to code status, artifical feeding and hydration, and rehospitalization were considered and discussed.  DNR noted.  Discussed the importance of continued conversation with family and the medical providers regarding overall plan of care and treatment options, ensuring decisions are within the context of the patient's values and GOCs. Questions and concerns were addressed. The family was encouraged to  call with questions or concerns.  PMT will continue to support holistically.  Conference with attending, bedside nursing staff, transition of care team related to patient condition, needs, goals of care, disposition.   HCPOA  NEXT OF KIN    SUMMARY OF RECOMMENDATIONS   Continue to treat the treatable but no CPR or intubation Discharge to a higher level of care, [facility] "See how she does" Declines palliative or hospice services at this time   Code Status/Advance Care Planning: DNR  Symptom Management:  Per hospitalist, no additional needs at this time.  Palliative Prophylaxis:  Frequent Pain Assessment and Oral Care  Additional Recommendations (Limitations, Scope, Preferences): Continue to treatment no CPR or  intubation  Psycho-social/Spiritual:  Desire for further Chaplaincy support:no Additional Recommendations: Caregiving  Support/Resources and Education on Hospice  Prognosis:  Unable to determine, based on outcomes, guarded at this time.   Discharge Planning: Discharge to [facility]      Primary Diagnoses: Present on Admission:  Subdural hematoma (HCC)  Hypertension  Hypercholesteremia  CAD (coronary artery disease)   I have reviewed the medical record, interviewed the patient and family, and examined the patient. The following aspects are pertinent.  Past Medical History:  Diagnosis Date   Back pain    Cat scratch of right forearm 02/03/2014   Dyspnea    Hard of hearing    Hemorrhoid 04/06/2014   History of kidney stones    Hx of degenerative disc disease    Hypercholesterolemia    Hypertension    Migraines    Osteoarthritis    Stroke (HCC)    mini stroke, resolved in 30 minutes   Social History   Socioeconomic History   Marital status: Widowed    Spouse name: Not on file   Number of children: Not on file   Years of education: Not on file   Highest education level: Not on file  Occupational History   Not on file  Tobacco Use   Smoking status: Former    Current packs/day: 1.00    Average packs/day: 1 pack/day for 50.0 years (50.0 ttl pk-yrs)    Types: Cigarettes   Smokeless tobacco: Never  Vaping Use   Vaping status: Never Used  Substance and Sexual Activity   Alcohol use: No    Alcohol/week: 0.0 standard drinks of alcohol   Drug use: No   Sexual activity: Not Currently    Birth control/protection: Post-menopausal, Surgical    Comment: hyst  Other Topics Concern   Not on file  Social History Narrative   Not on file   Social Drivers of Health   Financial Resource Strain: Not on file  Food Insecurity: No Food Insecurity (10/21/2023)   Hunger Vital Sign    Worried About Running Out of Food in the Last Year: Never true    Ran Out of Food in the  Last Year: Never true  Transportation Needs: No Transportation Needs (10/21/2023)   PRAPARE - Administrator, Civil Service (Medical): No    Lack of Transportation (Non-Medical): No  Physical Activity: Not on file  Stress: Not on file  Social Connections: Patient Unable To Answer (10/21/2023)   Social Connection and Isolation Panel [NHANES]    Frequency of Communication with Friends and Family: Patient unable to answer    Frequency of Social Gatherings with Friends and Family: Patient unable to answer    Attends Religious Services: Patient unable to answer    Active Member of Clubs or Organizations: Patient  unable to answer    Attends Club or Organization Meetings: Patient unable to answer    Marital Status: Patient unable to answer   Family History  Problem Relation Age of Onset   Heart disease Mother    Cancer Father        liver   Cancer Son    Heart disease Son    Cancer Maternal Grandmother    Colon cancer Neg Hx    Scheduled Meds:  atorvastatin   40 mg Oral Daily   donepezil   10 mg Oral QHS   furosemide   40 mg Oral Daily   losartan   25 mg Oral Daily   metoprolol  succinate  100 mg Oral Daily   potassium chloride   40 mEq Oral BID   QUEtiapine   12.5 mg Oral QHS   venlafaxine   75 mg Oral BID   Continuous Infusions: PRN Meds:.acetaminophen  **OR** acetaminophen , albuterol , ondansetron  **OR** ondansetron  (ZOFRAN ) IV, mouth rinse Medications Prior to Admission:  Prior to Admission medications   Medication Sig Start Date End Date Taking? Authorizing Provider  acetaminophen  (TYLENOL ) 500 MG tablet Take 2 tablets (1,000 mg total) by mouth every 8 (eight) hours as needed for mild pain, moderate pain or headache. Patient taking differently: Take 500 mg by mouth every 6 (six) hours. 04/25/18  Yes Hongalgi, Thomasene Flemings, MD  albuterol  (PROVENTIL  HFA;VENTOLIN  HFA) 108 (90 Base) MCG/ACT inhaler Inhale 2 puffs into the lungs every 6 (six) hours as needed for wheezing or shortness of  breath. 04/25/18  Yes Hongalgi, Anand D, MD  aspirin  81 MG chewable tablet Chew 1 tablet (81 mg total) by mouth daily. 04/25/18  Yes Hongalgi, Anand D, MD  atorvastatin  (LIPITOR ) 40 MG tablet Take 40 mg by mouth daily.   Yes [provider]  clopidogrel  (PLAVIX ) 75 MG tablet Take 1 tablet (75 mg total) by mouth daily. 04/26/18  Yes Hongalgi, Anand D, MD  donepezil  (ARICEPT ) 10 MG tablet Take 10 mg by mouth daily. 08/14/23  Yes [provider]  fluconazole (DIFLUCAN) 150 MG tablet Take 150 mg by mouth daily as needed (recurrent yeast infections).   Yes [provider]  fluticasone  (FLONASE ) 50 MCG/ACT nasal spray Place 1 spray into both nostrils daily for 14 days. 03/05/20 10/21/23 Yes Avegno, Komlanvi S, FNP  furosemide  (LASIX ) 40 MG tablet Take 40 mg by mouth daily. 10/09/23  Yes [provider]  HYDROcodone -acetaminophen  (NORCO/VICODIN) 5-325 MG tablet Take 0.5 tablets by mouth every 6 (six) hours as needed for moderate pain (pain score 4-6) or severe pain (pain score 7-10). 08/26/23  Yes [provider]  hydrOXYzine  (VISTARIL ) 50 MG capsule Take 1 capsule by mouth daily.   Yes [provider]  lidocaine  (LIDODERM ) 5 % Place 1 patch onto the skin daily. 08/14/23  Yes [provider]  losartan  (COZAAR ) 25 MG tablet Take 1 tablet (25 mg total) by mouth daily. 09/12/23 09/11/24 Yes Justina Oman, MD  melatonin 3 MG TABS tablet Take 3 mg by mouth at bedtime.   Yes [provider]  metoprolol  succinate (TOPROL -XL) 100 MG 24 hr tablet Take 100 mg by mouth daily. 08/14/23  Yes [provider]  potassium chloride  SA (KLOR-CON  M) 20 MEQ tablet Take 20 mEq by mouth once.   Yes [provider]  QUEtiapine  (SEROQUEL ) 25 MG tablet Take 12.5 mg by mouth at bedtime. 08/14/23  Yes [provider]  venlafaxine  (EFFEXOR ) 75 MG tablet Take 75 mg by mouth 2 (two) times daily. 08/14/23  Yes [provider]   Allergies   Allergen Reactions   Adhesive [Tape] Other (See Comments)    Tears skin   Doxycycline  Nausea And Vomiting   Latex Itching and Rash   Review of Systems  Unable to perform ROS: Dementia    Physical Exam Vitals and nursing note reviewed.  Constitutional:      General: She is not in acute distress.    Appearance: She is not ill-appearing.  Cardiovascular:     Rate and Rhythm: Normal rate.  Skin:    General: Skin is warm and dry.  Neurological:     Mental Status: She is alert.     Comments: Known middle to late stages dementia  Psychiatric:     Comments: Calm and cooperative, not fearful     Vital Signs: BP (!) 165/89 (BP Location: Left Arm)   Pulse 91   Temp 97.8 F (36.6 C) (Oral)   Resp 18   Ht 5\' 3"  (1.6 m)   Wt 51.7 kg   SpO2 98%   BMI 20.19 kg/m  Pain Scale: 0-10   Pain Score: 0-No pain   SpO2: SpO2: 98 % O2 Device:SpO2: 98 % O2 Flow Rate: .O2 Flow Rate (L/min): 2 L/min  IO: Intake/output summary:  Intake/Output Summary (Last 24 hours) at 10/22/2023 0857 Last data filed at 10/22/2023 0525 Gross per 24 hour  Intake 120 ml  Output 300 ml  Net -180 ml    LBM:   Baseline Weight: Weight: 53.9 kg Most recent weight: Weight: 51.7 kg     Palliative Assessment/Data:     Time In: 0900   Time Out: 0955 Time Total: 55 minutes  Greater than 50%  of this time was spent counseling and coordinating care related to the above assessment and plan.  Signed by: Annabelle Barrack, NP   Please contact Palliative Medicine Team phone at [phone] for questions and concerns.  For individual provider: See Tilford Foley

## 2023-10-22 NOTE — TOC Progression Note (Signed)
 Transition of Care [facility]) - Progression Note    Patient Details  Name: [name] MRN: [id number] Date of Birth: 04/29/1941  Transition of Care [facility]) CM/SW Contact  Orelia Binet, RN Phone Number: 10/22/2023, 3:40 PM  Clinical Narrative:   PT is recommending SNF, FL2 completed, sending to [facility] CMA is starting [facility].    Expected Discharge Plan: Skilled Nursing Facility Barriers to Discharge: Continued Medical Work up  Expected Discharge Plan and Services In-house Referral: Clinical Social Work Discharge Planning Services: CM Consult   Living arrangements for the past 2 months: Assisted Living Facility                                       Social Determinants of Health (SDOH) Interventions SDOH Screenings   Food Insecurity: No Food Insecurity (10/21/2023)  Housing: Low Risk  (10/21/2023)  Transportation Needs: No Transportation Needs (10/21/2023)  Utilities: Not At Risk (10/21/2023)  Social Connections: Patient Unable To Answer (10/21/2023)  Tobacco Use: Medium Risk (10/22/2023)    Readmission Risk Interventions    09/12/2023    1:27 PM  Readmission Risk Prevention Plan  Transportation Screening Complete  PCP or Specialist Appt within 5-7 Days Not Complete  Home Care Screening Complete  Medication Review (RN CM) Complete

## 2023-10-22 NOTE — Care Management Obs Status (Signed)
 MEDICARE OBSERVATION STATUS NOTIFICATION   Patient Details  Name: Connie Wood MRN: [id number] Date of Birth: Sep 09, 1940   Medicare Observation Status Notification Given:  Yes    Geraldina Klinefelter, RN 10/22/2023, 5:45 PM

## 2023-10-22 NOTE — Plan of Care (Signed)
  Problem: Acute Rehab PT Goals(only PT should resolve) Goal: Pt Will Go Supine/Side To Sit Outcome: Progressing Flowsheets (Taken 10/22/2023 1545) Pt will go Supine/Side to Sit:  with contact guard assist  with minimal assist Goal: Patient Will Transfer Sit To/From Stand Outcome: Progressing Flowsheets (Taken 10/22/2023 1545) Patient will transfer sit to/from stand: with minimal assist Goal: Pt Will Transfer Bed To Chair/Chair To Bed Outcome: Progressing Flowsheets (Taken 10/22/2023 1545) Pt will Transfer Bed to Chair/Chair to Bed: with min assist Goal: Pt Will Ambulate Outcome: Progressing Flowsheets (Taken 10/22/2023 1545) Pt will Ambulate:  25 feet  with minimal assist  with moderate assist  with rolling walker   3:45 PM, 10/22/23 Walton Guppy, MPT Physical Therapist with [facility] [phone] office [phone] mobile phone

## 2023-10-22 NOTE — NC FL2 (Signed)
 [place]  MEDICAID FL2 LEVEL OF CARE FORM     IDENTIFICATION  Patient Name: Connie Wood Birthdate: [birth year] Sex: female Admission Date (Current Location): 10/21/2023  [facility] and [place] Number:  [employer] and Address:  [facility],  [address] [place] [address]      Provider Number: [id number]  Attending Physician Name and Address:  Cornelius Dill, DO  Relative Name and Phone Number:       Current Level of Care: Hospital Recommended Level of Care: Skilled Nursing Facility Prior Approval Number:    Date Approved/Denied:   PASRR Number: [id number]  Discharge Plan: SNF    Current Diagnoses: Patient Active Problem List   Diagnosis Date Noted   Subdural hematoma (HCC) 10/21/2023   Acute on chronic diastolic CHF (congestive heart failure) (HCC) 10/21/2023   Dementia (HCC) 10/21/2023   Acute respiratory failure with hypoxia (HCC) 09/09/2023   Mixed conductive and sensorineural hearing loss of both ears 07/17/2023   CAD (coronary artery disease) 05/03/2018   Elevated troponin 05/03/2018   Anemia 05/03/2018   NSTEMI (non-ST elevated myocardial infarction) (HCC) 04/23/2018   AKI (acute kidney injury) (HCC) 04/23/2018   Obesity (BMI 30-39.9) 04/23/2018   Acute on chronic respiratory failure with hypoxia (HCC) 04/23/2018   Hypotension 04/23/2018   Abnormal cardiac enzyme level    IBS (irritable bowel syndrome) 01/06/2017   Constipation 07/17/2016   History of colonic polyps    Tobacco abuse 09/28/2014   GERD (gastroesophageal reflux disease) 09/28/2014   Depression 09/28/2014   Chronic back pain 09/28/2014   Anxiety 09/28/2014   Common bile duct dilation 08/24/2014   H/O acute pancreatitis 07/11/2014   Hemorrhoid 04/06/2014   Hypertension 02/03/2014   Hypercholesteremia 02/03/2014   Dyspnea 07/12/2013   Chronic bronchitis (HCC) 07/11/2013   Acute bronchitis 07/11/2013   Nonhealing nonsurgical wound 04/05/2013   Stasis  edema 04/05/2013    Orientation RESPIRATION BLADDER Height & Weight     Self  O2 (2L) Incontinent Weight: 113 lb 15.7 oz (51.7 kg) Height:  5\' 3"  (160 cm)  BEHAVIORAL SYMPTOMS/MOOD NEUROLOGICAL BOWEL NUTRITION STATUS      Incontinent Diet (Heart healthy)  AMBULATORY STATUS COMMUNICATION OF NEEDS Skin   Extensive Assist Verbally Normal                       Personal Care Assistance Level of Assistance  Bathing, Feeding, Dressing Bathing Assistance: Maximum assistance Feeding assistance: Limited assistance Dressing Assistance: Maximum assistance     Functional Limitations Info  Sight, Hearing, Speech Sight Info: Adequate Hearing Info: Impaired Speech Info: Adequate    SPECIAL CARE FACTORS FREQUENCY  PT (By licensed PT), OT (By licensed OT)     PT Frequency: 5 times weekly OT Frequency: 5 times weekly            Contractures Contractures Info: Not present    Additional Factors Info  Code Status, Allergies Code Status Info: DNR-Limited Allergies Info: Adhesive (Tape), Doxycycline , Latex           Current Medications (10/22/2023):  This is the current hospital active medication list Current Facility-Administered Medications  Medication Dose Route Frequency Provider Last Rate Last Admin   acetaminophen  (TYLENOL ) tablet 650 mg  650 mg Oral Q6H PRN Arrien, Curlee Doss, MD       Or   acetaminophen  (TYLENOL ) suppository 650 mg  650 mg Rectal Q6H PRN Arrien, Curlee Doss, MD       albuterol  (PROVENTIL ) (  2.5 MG/3ML) 0.083% nebulizer solution 3 mL  3 mL Inhalation Q6H PRN Arrien, Curlee Doss, MD       atorvastatin  (LIPITOR ) tablet 40 mg  40 mg Oral Daily Arrien, Curlee Doss, MD   40 mg at 10/22/23 1610   donepezil  (ARICEPT ) tablet 10 mg  10 mg Oral QHS Arrien, Mauricio Daniel, MD   10 mg at 10/21/23 2045   furosemide  (LASIX ) tablet 40 mg  40 mg Oral Daily Arrien, Curlee Doss, MD   40 mg at 10/22/23 9604   hydrALAZINE  (APRESOLINE ) injection 10 mg   10 mg Intravenous Q4H PRN Mason Sole, Pratik D, DO       losartan  (COZAAR ) tablet 25 mg  25 mg Oral Daily Arrien, Mauricio Daniel, MD   25 mg at 10/22/23 5409   metoprolol  succinate (TOPROL -XL) 24 hr tablet 100 mg  100 mg Oral Daily Arrien, Curlee Doss, MD   100 mg at 10/22/23 0945   ondansetron  (ZOFRAN ) tablet 4 mg  4 mg Oral Q6H PRN Arrien, Mauricio Daniel, MD       Or   ondansetron  (ZOFRAN ) injection 4 mg  4 mg Intravenous Q6H PRN Arrien, Mauricio Daniel, MD       Oral care mouth rinse  15 mL Mouth Rinse PRN Arrien, Curlee Doss, MD       potassium chloride  SA (KLOR-CON  M) CR tablet 40 mEq  40 mEq Oral BID Shah, Pratik D, DO   40 mEq at 10/22/23 8119   QUEtiapine  (SEROQUEL ) tablet 12.5 mg  12.5 mg Oral QHS Arrien, Mauricio Daniel, MD   12.5 mg at 10/21/23 2045   venlafaxine  (EFFEXOR ) tablet 75 mg  75 mg Oral BID Arrien, Mauricio Daniel, MD   75 mg at 10/22/23 1478     Discharge Medications: Please see discharge summary for a list of discharge medications.  Relevant Imaging Results:  Relevant Lab Results:   Additional Information SSN [id number], LCSWA

## 2023-10-22 NOTE — Plan of Care (Signed)
   Problem: Clinical Measurements: Goal: Will remain free from infection Outcome: Progressing Goal: Diagnostic test results will improve Outcome: Progressing

## 2023-10-22 NOTE — Progress Notes (Signed)
 PROGRESS NOTE    Connie Wood  UEA:[id number] DOB: 10/18/1940 DOA: 10/21/2023 PCP: [name], NP   Brief Narrative:    Connie Wood is a 83 y.o. female with medical history significant of advanced dementia, hearing loss, hypertension, dyslipidemia, history of CVA and subdural hematoma who was transferred to the ED from SNF due to dyspnea.  On direct questioning patient does not recall why she was brought to the hospital, she does not remember filling ill over the last few days.  She denies headache, nausea or vomiting. No visual changes or recent falls.    I spoke with her son and daughter over the phone and her dementia has been rapidly progressive, she has lost her long and short term memory.    Recent hospitalization 03/17 to 09/12/23 for congested heart failure.  02/25 ED visit due to fall found bilateral subdural hematomas decision was made to continue conservative care.   Assessment & Plan:   Principal Problem:   Subdural hematoma (HCC) Active Problems:   Acute on chronic diastolic CHF (congestive heart failure) (HCC)   Hypertension   CAD (coronary artery disease)   Hypercholesteremia   Dementia (HCC)  Assessment and Plan:   Subdural hematoma (HCC) Worsening hematoma on the left with no significant neurologic deficits.  Stable midline shift  rightward    I spoke with her son, (POA), considering her advance dementia and poor prognosis, he and family made decision to continue with conservative care and avoid any surgical intervention. Code status is DNR. If patient decompensated from intracranial hypertension, plan is to transition to comfort care. Fall precautions and avoid anticoagulants and antiplatelet therapy  Neuro checks q 4 hrs  PT and OT evaluation pending.   Acute on chronic diastolic CHF (congestive heart failure) (HCC) Continue blood pressure monitoring Continue furosemide  po 40 mg po daily  Continue with losartan  and metoprolol .   Avoid hypotension,      Hypertension Continue blood pressure control with losartan  and metoprolol .   CAD (coronary artery disease) Mild troponin elevation due to decompensated heart failure Patient with no chest pain, ruled out acute coronary syndrome   Continue statin, no antiplatelet therapy.    Hypercholesteremia Continue statin therapy    Dementia (HCC) Decreased hearing Very poor prognosis due to advanced disease Patient will need sitter to prevent further agitation,.  Avoid haldol due to prolonged qtc   Continue with quetiapine  and venlafaxine      DVT prophylaxis: SCDs Code Status: Full Family Communication: None at bedside Disposition Plan:  Status is: Observation The patient will require care spanning > 2 midnights and should be moved to inpatient because: Need for IV medications and close monitoring.  Consultants:  Palliative  Procedures:  None  Antimicrobials:  None   Subjective: Patient seen and evaluated today with no new acute complaints or concerns. No acute concerns or events noted overnight.  She remains pleasantly confused and has sitter at bedside.  Objective: Vitals:   10/21/23 2046 10/21/23 2138 10/22/23 0404 10/22/23 1315  BP: (!) 170/95 (!) 169/93 (!) 165/89 (!) 168/102  Pulse: 93 90 91 92  Resp: 16  18   Temp: 99 F (37.2 C)  97.8 F (36.6 C) 98.8 F (37.1 C)  TempSrc: Oral  Oral Oral  SpO2: 97%  98% 99%  Weight:      Height:        Intake/Output Summary (Last 24 hours) at 10/22/2023 1530 Last data filed at 10/22/2023 0900 Gross per 24 hour  Intake 120 ml  Output 750 ml  Net -630 ml   Filed Weights   10/21/23 0606 10/21/23 1306  Weight: 53.9 kg 51.7 kg    Examination:  General exam: Appears calm and comfortable  Respiratory system: Clear to auscultation. Respiratory effort normal. Cardiovascular system: S1 & S2 heard, RRR.  Gastrointestinal system: Abdomen is soft Central nervous system: Alert and awake Extremities:  No edema Skin: No significant lesions noted Psychiatry: Flat affect.    Data Reviewed: I have personally reviewed following labs and imaging studies  CBC: Recent Labs  Lab 10/21/23 0604 10/22/23 0352  WBC 9.1 9.9  NEUTROABS 6.5  --   HGB 11.2* 10.2*  HCT 35.6* 32.6*  MCV 101.4* 98.8  PLT 232 225   Basic Metabolic Panel: Recent Labs  Lab 10/21/23 0604 10/22/23 0352  NA 139 138  K 4.4 3.4*  CL 102 103  CO2 28 25  GLUCOSE 111* 114*  BUN 25* 19  CREATININE 1.01* 0.95  CALCIUM  9.4 9.1   GFR: Estimated Creatinine Clearance: 37.3 mL/min (by C-G formula based on SCr of 0.95 mg/dL). Liver Function Tests: No results for input(s): "AST", "ALT", "ALKPHOS", "BILITOT", "PROT", "ALBUMIN" in the last 168 hours. No results for input(s): "LIPASE", "AMYLASE" in the last 168 hours. No results for input(s): "AMMONIA" in the last 168 hours. Coagulation Profile: No results for input(s): "INR", "PROTIME" in the last 168 hours. Cardiac Enzymes: No results for input(s): "CKTOTAL", "CKMB", "CKMBINDEX", "TROPONINI" in the last 168 hours. BNP (last 3 results) No results for input(s): "PROBNP" in the last 8760 hours. HbA1C: No results for input(s): "HGBA1C" in the last 72 hours. CBG: No results for input(s): "GLUCAP" in the last 168 hours. Lipid Profile: No results for input(s): "CHOL", "HDL", "LDLCALC", "TRIG", "CHOLHDL", "LDLDIRECT" in the last 72 hours. Thyroid  Function Tests: No results for input(s): "TSH", "T4TOTAL", "FREET4", "T3FREE", "THYROIDAB" in the last 72 hours. Anemia Panel: No results for input(s): "VITAMINB12", "FOLATE", "FERRITIN", "TIBC", "IRON", "RETICCTPCT" in the last 72 hours. Sepsis Labs: No results for input(s): "PROCALCITON", "LATICACIDVEN" in the last 168 hours.  Recent Results (from the past 240 hours)  Resp panel by RT-PCR (RSV, Flu A&B, Covid) Anterior Nasal Swab     Status: None   Collection Time: 10/21/23  6:19 AM   Specimen: Anterior Nasal Swab  Result  Value Ref Range Status   SARS Coronavirus 2 by RT PCR NEGATIVE NEGATIVE Final    Comment: (NOTE) SARS-CoV-2 target nucleic acids are NOT DETECTED.  The SARS-CoV-2 RNA is generally detectable in upper respiratory specimens during the acute phase of infection. The lowest concentration of SARS-CoV-2 viral copies this assay can detect is 138 copies/mL. A negative result does not preclude SARS-Cov-2 infection and should not be used as the sole basis for treatment or other patient management decisions. A negative result may occur with  improper specimen collection/handling, submission of specimen other than nasopharyngeal swab, presence of viral mutation(s) within the areas targeted by this assay, and inadequate number of viral copies(<138 copies/mL). A negative result must be combined with clinical observations, patient history, and epidemiological information. The expected result is Negative.  Fact Sheet for Patients:  BloggerCourse.com  Fact Sheet for Healthcare Providers:  SeriousBroker.it  This test is no t yet approved or cleared by the [place]  FDA and  has been authorized for detection and/or diagnosis of SARS-CoV-2 by FDA under an Emergency Use Authorization (EUA). This EUA will remain  in effect (meaning this test can be used) for the  duration of the COVID-19 declaration under Section 564(b)(1) of the Act, 21 U.S.C.section 360bbb-3(b)(1), unless the authorization is terminated  or revoked sooner.       Influenza A by PCR NEGATIVE NEGATIVE Final   Influenza B by PCR NEGATIVE NEGATIVE Final    Comment: (NOTE) The Xpert Xpress SARS-CoV-2/FLU/RSV plus assay is intended as an aid in the diagnosis of influenza from Nasopharyngeal swab specimens and should not be used as a sole basis for treatment. Nasal washings and aspirates are unacceptable for Xpert Xpress SARS-CoV-2/FLU/RSV testing.  Fact Sheet for  Patients: BloggerCourse.com  Fact Sheet for Healthcare Providers: SeriousBroker.it  This test is not yet approved or cleared by the [place]  FDA and has been authorized for detection and/or diagnosis of SARS-CoV-2 by FDA under an Emergency Use Authorization (EUA). This EUA will remain in effect (meaning this test can be used) for the duration of the COVID-19 declaration under Section 564(b)(1) of the Act, 21 U.S.C. section 360bbb-3(b)(1), unless the authorization is terminated or revoked.     Resp Syncytial Virus by PCR NEGATIVE NEGATIVE Final    Comment: (NOTE) Fact Sheet for Patients: BloggerCourse.com  Fact Sheet for Healthcare Providers: SeriousBroker.it  This test is not yet approved or cleared by the [place]  FDA and has been authorized for detection and/or diagnosis of SARS-CoV-2 by FDA under an Emergency Use Authorization (EUA). This EUA will remain in effect (meaning this test can be used) for the duration of the COVID-19 declaration under Section 564(b)(1) of the Act, 21 U.S.C. section 360bbb-3(b)(1), unless the authorization is terminated or revoked.  Performed at [facility], [address] [place], [place] [address]   MRSA Next Gen by PCR, Nasal     Status: None   Collection Time: 10/21/23  2:15 PM   Specimen: Nasal Mucosa; Nasal Swab  Result Value Ref Range Status   MRSA by PCR Next Gen NOT DETECTED NOT DETECTED Final    Comment: (NOTE) The GeneXpert MRSA Assay (FDA approved for NASAL specimens only), is one component of a comprehensive MRSA colonization surveillance program. It is not intended to diagnose MRSA infection nor to guide or monitor treatment for MRSA infections. Test performance is not FDA approved in patients less than 97 years old. Performed at [facility], [address] [place], [place] [address]          Radiology  Studies: [facility] 1 View Result Date: 10/21/2023 CLINICAL DATA:  Shortness of breath. EXAM: PORTABLE CHEST 1 VIEW COMPARISON:  09/08/2023 FINDINGS: Diffuse interstitial prominence again noted with a basilar predominance, right greater than left. No dense focal consolidative airspace disease or substantial pleural effusion. The cardiopericardial silhouette is within normal limits for size. No acute bony abnormality. Telemetry leads overlie the chest. IMPRESSION: Diffuse interstitial prominence with a basilar predominance, right greater than left. While likely chronic, imaging features could be related to edema or infection. Electronically Signed   By: Donnal Fusi M.D.   On: 10/21/2023 06:30   CT HEAD WO CONTRAST ( ) Result Date: 10/21/2023 CLINICAL DATA:  83 year old female found with altered mental status, shortness of breath. EXAM: CT HEAD WITHOUT CONTRAST TECHNIQUE: Contiguous axial images were obtained from the base of the skull through the vertex without intravenous contrast. RADIATION DOSE REDUCTION: This exam was performed according to the departmental dose-optimization program which includes automated exposure control, adjustment of the mA and/or kV according to patient size and/or use of iterative reconstruction technique. COMPARISON:  Head CT 07/26/2023. FINDINGS: Brain: Left greater than  right low-density subdural hematoma in February, left side subdural now septated, intermittently hyperdense, and larger measuring up to 2 cm in thickness (previously 14-15 mm (coronal image 28). Contralateral right side subdural remains hypodense and appears smaller, 4-5 mm (versus 8-9 mm previously). Intracranial mass effect with rightward midline shift not significantly changed, 5-6 mm (series 2, image 18 today versus series 3, image 19 previously). Stable partially effaced left lateral ventricle. Basilar cisterns remain patent. No new areas of intracranial hemorrhage. Stable gray-white matter  differentiation throughout the brain. No cortically based acute infarct identified. Widespread cerebral white matter hypodensity including deep white matter capsule involvement. Vascular: Generalized intracranial artery tortuosity. Calcified atherosclerosis at the skull base. No suspicious intracranial vascular hyperdensity. Skull: Stable and intact. Sinuses/Orbits: Improved left middle ear and mastoid aeration since February. Otherwise stable paranasal sinuses, right middle ear and mastoid aeration. Other: No acute orbit or scalp soft tissue finding. IMPRESSION: 1. Chronic Left subdural Hematoma with progressed loculation and hyperdense blood products since February, increased by up to 5 mm thickness (up to 20 mm now), although associated intracranial mass effect with 5-6 mm of rightward midline shift is stable. 2. Right side SDH remains low-density and is smaller, 4-5 mm now. 3. No other acute intracranial abnormality. Chronic white matter disease, intracranial artery tortuosity. Electronically Signed   By: Marlise Simpers M.D.   On: 10/21/2023 06:21        Scheduled Meds:  atorvastatin   40 mg Oral Daily   donepezil   10 mg Oral QHS   furosemide   40 mg Oral Daily   losartan   25 mg Oral Daily   metoprolol  succinate  100 mg Oral Daily   potassium chloride   40 mEq Oral BID   QUEtiapine   12.5 mg Oral QHS   venlafaxine   75 mg Oral BID     LOS: 0 days    Time spent: 55 minutes    Finnbar Cedillos D Ayane Delancey, DO [facility]  If 7PM-7AM, please contact night-coverage [email or web address] 10/22/2023, 3:30 PM

## 2023-10-22 NOTE — Evaluation (Signed)
 Physical Therapy Evaluation Patient Details Name: Connie Wood MRN: [id number] DOB: Aug 31, 1940 Today's Date: 10/22/2023  History of Present Illness  Connie Wood is a 83 y.o. female with medical history significant of advanced dementia, hearing loss, hypertension, dyslipidemia, history of CVA and subdural hematoma who was transferred to the ED from SNF due to dyspnea.   On direct questioning patient does not recall why she was brought to the hospital, she does not remember filling ill over the last few days.   She denies headache, nausea or vomiting. No visual changes or recent falls.      I spoke with her son and daughter over the phone and her dementia has been rapidly progressive, she has lost her long and shot term memory.      Recent hospitalization 03/17 to 09/12/23 for congested heart failure.   02/25 ED visit due to fall found bilateral subdural hematomas decision was made to continue conservative care.   Clinical Impression  Patient requires repeated verbal/tactile cueing for initiating functional mobility, demonstrates labored movement for sitting up at bedside, unable to maintain standing balance with hand held assist, required use of RW for taking a few side steps before having to sit due to BLE weakness with loss of standing balance. Patient tolerated sitting up in chair after therapy - nursing staff notified. Patient will benefit from continued skilled physical therapy in hospital and recommended venue below to increase strength, balance, endurance for safe ADLs and gait.        If plan is discharge home, recommend the following: A lot of help with bathing/dressing/bathroom;A lot of help with walking and/or transfers;Help with stairs or ramp for entrance;Assistance with cooking/housework   Can travel by private vehicle   No    Equipment Recommendations Rolling walker (2 wheels)  Recommendations for Other Services       Functional Status Assessment Patient has  had a recent decline in their functional status and demonstrates the ability to make significant improvements in function in a reasonable and predictable amount of time.     Precautions / Restrictions Precautions Precautions: Fall Restrictions Weight Bearing Restrictions Per Provider Order: No      Mobility  Bed Mobility Overal bed mobility: Needs Assistance Bed Mobility: Supine to Sit     Supine to sit: Min assist, Mod assist     General bed mobility comments: increased time, labored movement    Transfers Overall transfer level: Needs assistance Equipment used: Rolling walker (2 wheels) Transfers: Sit to/from Stand, Bed to chair/wheelchair/BSC Sit to Stand: Mod assist   Step pivot transfers: Mod assist       General transfer comment: unable to maintain standing balance with hand held assist, required use of RW for safety    Ambulation/Gait Ambulation/Gait assistance: Mod assist, Max assist Gait Distance (Feet): 4 Feet Assistive device: Rolling walker (2 wheels) Gait Pattern/deviations: Decreased step length - right, Decreased step length - left, Decreased stride length, Shuffle Gait velocity: slow     General Gait Details: limited to a few shuffling side steps using RW due to BLE weakness, poor standing balance  Stairs            Wheelchair Mobility     Tilt Bed    Modified Rankin (Stroke Patients Only)       Balance Overall balance assessment: Needs assistance Sitting-balance support: Feet supported, No upper extremity supported Sitting balance-Leahy Scale: Fair Sitting balance - Comments: seated at EOB   Standing balance support: During functional activity,  No upper extremity supported Standing balance-Leahy Scale: Poor Standing balance comment: poor using RW                             Pertinent Vitals/Pain Pain Assessment Pain Assessment: Faces Faces Pain Scale: Hurts a little bit Pain Location: BLE Pain Descriptors /  Indicators: Dull, Sore Pain Intervention(s): Limited activity within patient's tolerance, Monitored during session, Repositioned    Home Living Family/patient expects to be discharged to:: Assisted living                        Prior Function Prior Level of Function : Needs assist       Physical Assist : Mobility (physical);ADLs (physical) Mobility (physical): Bed mobility;Transfers;Gait;Stairs   Mobility Comments: Patient is poor historian ADLs Comments: Assisted by ALF staff     Extremity/Trunk Assessment   Upper Extremity Assessment Upper Extremity Assessment: Generalized weakness    Lower Extremity Assessment Lower Extremity Assessment: Generalized weakness    Cervical / Trunk Assessment Cervical / Trunk Assessment: Kyphotic  Communication   Communication Communication: No apparent difficulties    Cognition Arousal: Alert Behavior During Therapy: Flat affect   PT - Cognitive impairments: History of cognitive impairments                                 Cueing Cueing Techniques: Tactile cues, Verbal cues     General Comments      Exercises     Assessment/Plan    PT Assessment Patient needs continued PT services  PT Problem List Decreased strength;Decreased activity tolerance;Decreased balance;Decreased mobility       PT Treatment Interventions DME instruction;Gait training;Stair training;Functional mobility training;Therapeutic activities;Therapeutic exercise;Balance training;Patient/family education    PT Goals (Current goals can be found in the Care Plan section)  Acute Rehab PT Goals Patient Stated Goal: return home PT Goal Formulation: With patient Time For Goal Achievement: 11/05/23 Potential to Achieve Goals: Good    Frequency Min 3X/week     Co-evaluation               AM-PAC PT "6 Clicks" Mobility  Outcome Measure Help needed turning from your back to your side while in a flat bed without using bedrails?:  A Little Help needed moving from lying on your back to sitting on the side of a flat bed without using bedrails?: A Lot Help needed moving to and from a bed to a chair (including a wheelchair)?: A Lot Help needed standing up from a chair using your arms (e.g., wheelchair or bedside chair)?: A Lot Help needed to walk in hospital room?: A Lot Help needed climbing 3-5 steps with a railing? : Total 6 Click Score: 12    End of Session   Activity Tolerance: Patient tolerated treatment well;Patient limited by fatigue Patient left: in chair;with call bell/phone within reach;with chair alarm set Nurse Communication: Mobility status PT Visit Diagnosis: Unsteadiness on feet (R26.81);Other abnormalities of gait and mobility (R26.89);Muscle weakness (generalized) (M62.81)    Time: 1610-9604 PT Time Calculation (min) (ACUTE ONLY): 23 min   Charges:   PT Evaluation $PT Eval Moderate Complexity: 1 Mod PT Treatments $Therapeutic Activity: 23-37 mins PT General Charges $$ ACUTE PT VISIT: 1 Visit         3:44 PM, 10/22/23 Walton Guppy, MPT Physical Therapist with [facility] [phone] office  [phone] mobile phone

## 2023-10-23 DIAGNOSIS — Z515 Encounter for palliative care: Secondary | ICD-10-CM | POA: Diagnosis not present

## 2023-10-23 DIAGNOSIS — S065XAA Traumatic subdural hemorrhage with loss of consciousness status unknown, initial encounter: Secondary | ICD-10-CM | POA: Diagnosis not present

## 2023-10-23 DIAGNOSIS — Z7189 Other specified counseling: Secondary | ICD-10-CM | POA: Diagnosis not present

## 2023-10-23 LAB — BASIC METABOLIC PANEL WITH GFR
Anion gap: 8 (ref 5–15)
BUN: 30 mg/dL — ABNORMAL HIGH (ref 8–23)
CO2: 26 mmol/L (ref 22–32)
Calcium: 9.1 mg/dL (ref 8.9–10.3)
Chloride: 101 mmol/L (ref 98–111)
Creatinine, Ser: 1.5 mg/dL — ABNORMAL HIGH (ref 0.44–1.00)
GFR, Estimated: 35 mL/min — ABNORMAL LOW (ref >60)
Glucose, Bld: 99 mg/dL (ref 70–99)
Potassium: 4.4 mmol/L (ref 3.5–5.1)
Sodium: 135 mmol/L (ref 135–145)

## 2023-10-23 LAB — CBC
HCT: 33.5 % — ABNORMAL LOW (ref 36.0–46.0)
Hemoglobin: 10.7 g/dL — ABNORMAL LOW (ref 12.0–15.0)
MCH: 32 pg (ref 26.0–34.0)
MCHC: 31.9 g/dL (ref 30.0–36.0)
MCV: 100.3 fL — ABNORMAL HIGH (ref 80.0–100.0)
Platelets: 233 10*3/uL (ref 150–400)
RBC: 3.34 MIL/uL — ABNORMAL LOW (ref 3.87–5.11)
RDW: 13.8 % (ref 11.5–15.5)
WBC: 8.5 10*3/uL (ref 4.0–10.5)
nRBC: 0 % (ref 0.0–0.2)

## 2023-10-23 LAB — MAGNESIUM: Magnesium: 2.1 mg/dL (ref 1.7–2.4)

## 2023-10-23 MED ORDER — LACTATED RINGERS IV BOLUS
1000.0000 mL | Freq: Once | INTRAVENOUS | Status: AC
  Administered 2023-10-23: 1000 mL via INTRAVENOUS

## 2023-10-23 NOTE — Progress Notes (Signed)
 PROGRESS NOTE    Connie Wood  ZOX:[id number] DOB: [birth year] DOA: 10/21/2023 PCP: [name], NP   Brief Narrative:    Connie Wood is a 83 y.o. female with medical history significant of advanced dementia, hearing loss, hypertension, dyslipidemia, history of CVA and subdural hematoma who was transferred to the ED from SNF due to dyspnea.  On direct questioning patient does not recall why she was brought to the hospital, she does not remember filling ill over the last few days.  She denies headache, nausea or vomiting. No visual changes or recent falls.    I spoke with her son and daughter over the phone and her dementia has been rapidly progressive, she has lost her long and short term memory.    Recent hospitalization 03/17 to 09/12/23 for congested heart failure.  02/25 ED visit due to fall found bilateral subdural hematomas decision was made to continue conservative care.   Assessment & Plan:   Principal Problem:   Subdural hematoma (HCC) Active Problems:   Acute on chronic diastolic CHF (congestive heart failure) (HCC)   Hypertension   CAD (coronary artery disease)   Hypercholesteremia   Dementia (HCC)  Assessment and Plan:   Subdural hematoma (HCC) Worsening hematoma on the left with no significant neurologic deficits.  Stable midline shift  rightward    I spoke with her son, (POA), considering her advance dementia and poor prognosis, he and family made decision to continue with conservative care and avoid any surgical intervention. Code status is DNR. If patient decompensated from intracranial hypertension, plan is to transition to comfort care. Fall precautions and avoid anticoagulants and antiplatelet therapy  Neuro checks q 4 hrs  PT and OT evaluation pending.   Acute on chronic diastolic CHF (congestive heart failure) (HCC) Continue blood pressure monitoring Hold furosemide  given AKI Hold losartan  given AKI and continue  metoprolol  Avoid hypotension  AKI Likely prerenal due to poor oral intake Hold furosemide  and losartan  Give IV fluid and recheck labs in a.m.     Hypertension Continue blood pressure control with losartan  and metoprolol .   CAD (coronary artery disease) Mild troponin elevation due to decompensated heart failure Patient with no chest pain, ruled out acute coronary syndrome   Continue statin, no antiplatelet therapy.    Hypercholesteremia Continue statin therapy    Dementia (HCC) Decreased hearing Very poor prognosis due to advanced disease Patient will need sitter to prevent further agitation,.  Avoid haldol  due to prolonged qtc   Continue with quetiapine  and venlafaxine      DVT prophylaxis: SCDs Code Status: Full Family Communication: None at bedside Disposition Plan:  Status is: Observation The patient will require care spanning > 2 midnights and should be moved to inpatient because: Need for IV fluid.   Consultants:  Palliative  Procedures:  None  Antimicrobials:  None   Subjective: Patient seen and evaluated today with no new acute complaints or concerns. No acute concerns or events noted overnight.  She remains pleasantly confused and somnolent this morning.  Objective: Vitals:   10/22/23 1315 10/22/23 2033 10/23/23 0538 10/23/23 1308  BP: (!) 168/102 (!) 147/83 (!) 165/96 (!) 147/85  Pulse: 92 (!) 106 89 96  Resp:  17 16 18   Temp: 98.8 F (37.1 C) 99.1 F (37.3 C) 97.8 F (36.6 C) 98.9 F (37.2 C)  TempSrc: Oral Oral Axillary Oral  SpO2: 99% 95% 97% 93%  Weight:      Height:  Intake/Output Summary (Last 24 hours) at 10/23/2023 1514 Last data filed at 10/23/2023 0947 Gross per 24 hour  Intake 720 ml  Output --  Net 720 ml   Filed Weights   10/21/23 0606 10/21/23 1306  Weight: 53.9 kg 51.7 kg    Examination:  General exam: Appears calm and comfortable  Respiratory system: Clear to auscultation. Respiratory effort  normal. Cardiovascular system: S1 & S2 heard, RRR.  Gastrointestinal system: Abdomen is soft Central nervous system: Alert and awake Extremities: No edema Skin: No significant lesions noted Psychiatry: Flat affect.    Data Reviewed: I have personally reviewed following labs and imaging studies  CBC: Recent Labs  Lab 10/21/23 0604 10/22/23 0352 10/23/23 0424  WBC 9.1 9.9 8.5  NEUTROABS 6.5  --   --   HGB 11.2* 10.2* 10.7*  HCT 35.6* 32.6* 33.5*  MCV 101.4* 98.8 100.3*  PLT 232 225 233   Basic Metabolic Panel: Recent Labs  Lab 10/21/23 0604 10/22/23 0352 10/23/23 0424  NA 139 138 135  K 4.4 3.4* 4.4  CL 102 103 101  CO2 28 25 26   GLUCOSE 111* 114* 99  BUN 25* 19 30*  CREATININE 1.01* 0.95 1.50*  CALCIUM  9.4 9.1 9.1  MG  --   --  2.1   GFR: Estimated Creatinine Clearance: 23.6 mL/min (A) (by C-G formula based on SCr of 1.5 mg/dL (H)). Liver Function Tests: No results for input(s): "AST", "ALT", "ALKPHOS", "BILITOT", "PROT", "ALBUMIN" in the last 168 hours. No results for input(s): "LIPASE", "AMYLASE" in the last 168 hours. No results for input(s): "AMMONIA" in the last 168 hours. Coagulation Profile: No results for input(s): "INR", "PROTIME" in the last 168 hours. Cardiac Enzymes: No results for input(s): "CKTOTAL", "CKMB", "CKMBINDEX", "TROPONINI" in the last 168 hours. BNP (last 3 results) No results for input(s): "PROBNP" in the last 8760 hours. HbA1C: No results for input(s): "HGBA1C" in the last 72 hours. CBG: No results for input(s): "GLUCAP" in the last 168 hours. Lipid Profile: No results for input(s): "CHOL", "HDL", "LDLCALC", "TRIG", "CHOLHDL", "LDLDIRECT" in the last 72 hours. Thyroid  Function Tests: No results for input(s): "TSH", "T4TOTAL", "FREET4", "T3FREE", "THYROIDAB" in the last 72 hours. Anemia Panel: No results for input(s): "VITAMINB12", "FOLATE", "FERRITIN", "TIBC", "IRON", "RETICCTPCT" in the last 72 hours. Sepsis Labs: No results  for input(s): "PROCALCITON", "LATICACIDVEN" in the last 168 hours.  Recent Results (from the past 240 hours)  Resp panel by RT-PCR (RSV, Flu A&B, Covid) Anterior Nasal Swab     Status: None   Collection Time: 10/21/23  6:19 AM   Specimen: Anterior Nasal Swab  Result Value Ref Range Status   SARS Coronavirus 2 by RT PCR NEGATIVE NEGATIVE Final    Comment: (NOTE) SARS-CoV-2 target nucleic acids are NOT DETECTED.  The SARS-CoV-2 RNA is generally detectable in upper respiratory specimens during the acute phase of infection. The lowest concentration of SARS-CoV-2 viral copies this assay can detect is 138 copies/mL. A negative result does not preclude SARS-Cov-2 infection and should not be used as the sole basis for treatment or other patient management decisions. A negative result may occur with  improper specimen collection/handling, submission of specimen other than nasopharyngeal swab, presence of viral mutation(s) within the areas targeted by this assay, and inadequate number of viral copies(<138 copies/mL). A negative result must be combined with clinical observations, patient history, and epidemiological information. The expected result is Negative.  Fact Sheet for Patients:  BloggerCourse.com  Fact Sheet for Healthcare Providers:  SeriousBroker.it  This test is no t yet approved or cleared by the [place]  FDA and  has been authorized for detection and/or diagnosis of SARS-CoV-2 by FDA under an Emergency Use Authorization (EUA). This EUA will remain  in effect (meaning this test can be used) for the duration of the COVID-19 declaration under Section 564(b)(1) of the Act, 21 U.S.C.section 360bbb-3(b)(1), unless the authorization is terminated  or revoked sooner.       Influenza A by PCR NEGATIVE NEGATIVE Final   Influenza B by PCR NEGATIVE NEGATIVE Final    Comment: (NOTE) The Xpert Xpress SARS-CoV-2/FLU/RSV plus assay  is intended as an aid in the diagnosis of influenza from Nasopharyngeal swab specimens and should not be used as a sole basis for treatment. Nasal washings and aspirates are unacceptable for Xpert Xpress SARS-CoV-2/FLU/RSV testing.  Fact Sheet for Patients: BloggerCourse.com  Fact Sheet for Healthcare Providers: SeriousBroker.it  This test is not yet approved or cleared by the [place]  FDA and has been authorized for detection and/or diagnosis of SARS-CoV-2 by FDA under an Emergency Use Authorization (EUA). This EUA will remain in effect (meaning this test can be used) for the duration of the COVID-19 declaration under Section 564(b)(1) of the Act, 21 U.S.C. section 360bbb-3(b)(1), unless the authorization is terminated or revoked.     Resp Syncytial Virus by PCR NEGATIVE NEGATIVE Final    Comment: (NOTE) Fact Sheet for Patients: BloggerCourse.com  Fact Sheet for Healthcare Providers: SeriousBroker.it  This test is not yet approved or cleared by the [place]  FDA and has been authorized for detection and/or diagnosis of SARS-CoV-2 by FDA under an Emergency Use Authorization (EUA). This EUA will remain in effect (meaning this test can be used) for the duration of the COVID-19 declaration under Section 564(b)(1) of the Act, 21 U.S.C. section 360bbb-3(b)(1), unless the authorization is terminated or revoked.  Performed at [facility], [address], [place], [place] [address]   MRSA Next Gen by PCR, Nasal     Status: None   Collection Time: 10/21/23  2:15 PM   Specimen: Nasal Mucosa; Nasal Swab  Result Value Ref Range Status   MRSA by PCR Next Gen NOT DETECTED NOT DETECTED Final    Comment: (NOTE) The GeneXpert MRSA Assay (FDA approved for NASAL specimens only), is one component of a comprehensive MRSA colonization surveillance program. It is not intended to diagnose  MRSA infection nor to guide or monitor treatment for MRSA infections. Test performance is not FDA approved in patients less than 49 years old. Performed at [facility], [address] [place], [place] [address]          Radiology Studies: No results found.       Scheduled Meds:  atorvastatin   40 mg Oral Daily   donepezil   10 mg Oral QHS   metoprolol  succinate  100 mg Oral Daily   QUEtiapine   12.5 mg Oral QHS   venlafaxine   75 mg Oral BID     LOS: 0 days    Time spent: 55 minutes    Venola Castello D Mason Sole, DO [facility]  If 7PM-7AM, please contact night-coverage [email or web address] 10/23/2023, 3:14 PM

## 2023-10-23 NOTE — Plan of Care (Signed)
   Problem: Coping: Goal: Level of anxiety will decrease Outcome: Progressing   Problem: Elimination: Goal: Will not experience complications related to bowel motility Outcome: Progressing

## 2023-10-23 NOTE — Progress Notes (Signed)
 [facility] Liaison Note  Received request from [facility] for hospice services at [facility] after discharge. Spoke with patient's son, Rodman Clam to initiate education related to hospice philosophy, services and team approach to care. Brad verbalized understanding of information given. Per discussion, the plan is for discharge home tomorrow.   DME needs discussed. Patient has the following equipment in the home: rollator. Family requests the following equipment for delivery: none at this time, admission nurse to assess.   Please send signed and completed DNR home with patient/family. Please provide prescriptions at discharge as needed to ensure ongoing symptom management.  [facility] information and contact numbers given to [place]. Please call with any concerns.  Thank you for the opportunity to participate in this patient's care.   Ardine Beckwith, LPN [facility] Liaison [phone]

## 2023-10-23 NOTE — Progress Notes (Addendum)
 Palliative: Connie Wood is lying quietly in bed.  She appears frail and elderly.  She greets me, making and mostly keeping eye contact.  She is alert, able to make her needs known even with dementia.  There is no family at bedside at this time.  I help set up her lunch tray and she is able to feed herself with some cueing.   Call to son Connie Wood.  We talk about [name]'s morning, she is stable.  We talked about disposition.  I share that insurance has denied rehab but Connie Wood is willing to take her with [facility] hospice.  Connie Wood states that he has spoken with Connie Wood at [facility] and he understands that his mother will be transferred there tomorrow.  He states that Connie Wood shared that there will be anyone to do her intake this late in the day.  Conference with attending, bedside nursing staff, transition of care team related to patient condition, needs, goals of care, disposition.  Plan: Short-term rehab at [facility] if qualified.  If insurance denies rehab then would transfer to [facility] with hospice services.      35 minutes   Connie Lab, NP Palliative Medicine Team  Team phone [phone]

## 2023-10-23 NOTE — Plan of Care (Signed)

## 2023-10-23 NOTE — TOC Progression Note (Signed)
 Transition of Care [facility]) - Progression Note    Patient Details  Name: Connie Wood MRN: [id number] Date of Birth: [birth year]  Transition of Care [facility]) CM/SW Contact  Orelia Binet, RN Phone Number: 10/23/2023, 3:43 PM  Clinical Narrative:   Peer to Peer completed, insurance denied for SNF. Palliative discussed with family they are agreeable to patient going to [facility] with [facility] hospice.  [facility] will accepted in the morning. [facility] notified.      Expected Discharge Plan: Skilled Nursing Facility Barriers to Discharge: Continued Medical Work up  Expected Discharge Plan and Services In-house Referral: Clinical Social Work Discharge Planning Services: CM Consult   Living arrangements for the past 2 months: Assisted Living Facility                     Social Determinants of Health (SDOH) Interventions SDOH Screenings   Food Insecurity: No Food Insecurity (10/21/2023)  Housing: Low Risk  (10/21/2023)  Transportation Needs: No Transportation Needs (10/21/2023)  Utilities: Not At Risk (10/21/2023)  Social Connections: Patient Unable To Answer (10/21/2023)  Tobacco Use: Medium Risk (10/22/2023)    Readmission Risk Interventions    09/12/2023    1:27 PM  Readmission Risk Prevention Plan  Transportation Screening Complete  PCP or Specialist Appt within 5-7 Days Not Complete  Home Care Screening Complete  Medication Review (RN CM) Complete

## 2023-10-24 DIAGNOSIS — S065XAA Traumatic subdural hemorrhage with loss of consciousness status unknown, initial encounter: Secondary | ICD-10-CM | POA: Diagnosis not present

## 2023-10-24 LAB — CBC
HCT: 36.1 % (ref 36.0–46.0)
Hemoglobin: 11.1 g/dL — ABNORMAL LOW (ref 12.0–15.0)
MCH: 30.9 pg (ref 26.0–34.0)
MCHC: 30.7 g/dL (ref 30.0–36.0)
MCV: 100.6 fL — ABNORMAL HIGH (ref 80.0–100.0)
Platelets: 234 10*3/uL (ref 150–400)
RBC: 3.59 MIL/uL — ABNORMAL LOW (ref 3.87–5.11)
RDW: 13.5 % (ref 11.5–15.5)
WBC: 10 10*3/uL (ref 4.0–10.5)
nRBC: 0 % (ref 0.0–0.2)

## 2023-10-24 LAB — BASIC METABOLIC PANEL WITH GFR
Anion gap: 9 (ref 5–15)
BUN: 26 mg/dL — ABNORMAL HIGH (ref 8–23)
CO2: 27 mmol/L (ref 22–32)
Calcium: 9.2 mg/dL (ref 8.9–10.3)
Chloride: 104 mmol/L (ref 98–111)
Creatinine, Ser: 1.11 mg/dL — ABNORMAL HIGH (ref 0.44–1.00)
GFR, Estimated: 50 mL/min — ABNORMAL LOW (ref >60)
Glucose, Bld: 94 mg/dL (ref 70–99)
Potassium: 4.4 mmol/L (ref 3.5–5.1)
Sodium: 140 mmol/L (ref 135–145)

## 2023-10-24 LAB — MAGNESIUM: Magnesium: 2.1 mg/dL (ref 1.7–2.4)

## 2023-10-24 MED ORDER — HYDRALAZINE HCL 20 MG/ML IJ SOLN
10.0000 mg | Freq: Once | INTRAMUSCULAR | Status: AC
  Administered 2023-10-24: 10 mg via INTRAVENOUS
  Filled 2023-10-24: qty 1

## 2023-10-24 MED ORDER — HYDROCODONE-ACETAMINOPHEN 5-325 MG PO TABS: 0.5000 | ORAL_TABLET | Freq: Four times a day (QID) | ORAL | 0 refills | Status: DC | PRN

## 2023-10-24 MED ORDER — LOSARTAN POTASSIUM 50 MG PO TABS
25.0000 mg | ORAL_TABLET | Freq: Every day | ORAL | Status: DC
  Administered 2023-10-24: 25 mg via ORAL
  Filled 2023-10-24: qty 1

## 2023-10-24 NOTE — TOC Transition Note (Signed)
 Transition of Care [facility]) - Discharge Note   Patient Details  Name: Connie Wood MRN: [id number] Date of Birth: [birth year]  Transition of Care [facility]) CM/SW Contact:  Orelia Binet, RN Phone Number: 10/24/2023, 10:47 AM   Clinical Narrative:   Patient discharging to [facility], DC summary sent, room provided, RN calling report, TOC left her son a message and schedule EMS.    Final next level of care: Skilled Nursing Facility Barriers to Discharge: Barriers Resolved   Patient Goals and CMS Choice Patient states their goals for this hospitalization and ongoing recovery are:: family want SNF CMS Medicare.gov Compare Post Acute Care list provided to:: Patient Represenative (must comment) Choice offered to / list presented to : Adult Children      Discharge Placement                Patient to be transferred to facility by: EMS Name of family member notified: Left message with Dollie Freshwater, Rodman Clam Patient and family notified of of transfer: 10/24/23  Discharge Plan and Services Additional resources added to the After Visit Summary for   In-house Referral: Clinical Social Work Discharge Planning Services: CM Consult        Social Drivers of Health (SDOH) Interventions SDOH Screenings   Food Insecurity: No Food Insecurity (10/21/2023)  Housing: Low Risk  (10/21/2023)  Transportation Needs: No Transportation Needs (10/21/2023)  Utilities: Not At Risk (10/21/2023)  Social Connections: Patient Unable To Answer (10/21/2023)  Tobacco Use: Medium Risk (10/22/2023)    Readmission Risk Interventions    09/12/2023    1:27 PM  Readmission Risk Prevention Plan  Transportation Screening Complete  PCP or Specialist Appt within 5-7 Days Not Complete  Home Care Screening Complete  Medication Review (RN CM) Complete

## 2023-10-24 NOTE — Plan of Care (Signed)

## 2023-10-24 NOTE — Discharge Summary (Signed)
 Physician Discharge Summary  Connie Wood WUJ:[id number] DOB: 25-Oct-1940 DOA: 10/21/2023  PCP: [name], NP  Admit date: 10/21/2023  Discharge date: 10/24/2023  Admitted From:Home  Disposition:  Hospice  Recommendations for Outpatient Follow-up:  Follow up with hospice agency Continue medications as ordered and wean per hospice protocol  Home Health: None  Equipment/Devices: None  Discharge Condition:Stable  CODE STATUS: DNR  Diet recommendation: Heart Healthy  Brief/Interim Summary: Connie Wood is a 83 y.o. female with medical history significant of advanced dementia, hearing loss, hypertension, dyslipidemia, history of CVA and subdural hematoma who was transferred to the ED from SNF due to dyspnea.  On direct questioning patient does not recall why she was brought to the hospital, she does not remember filling ill over the last few days.  She denies headache, nausea or vomiting. No visual changes or recent falls.    I spoke with her son and daughter over the phone and her dementia has been rapidly progressive, she has lost her long and short term memory.    Recent hospitalization 03/17 to 09/12/23 for congested heart failure.  02/25 ED visit due to fall found bilateral subdural hematomas decision was made to continue conservative care.  Patient was seen by palliative care and further discussions were had with family members who elected to see if she could have some form of rehabilitation with physical therapy, however she does not qualify for inpatient rehabilitation.  They are now agreeable for hospice care which will be initiated at a separate facility.  No other acute events or concerns noted throughout the course of the stay and she is now in stable condition for discharge.  Discharge Diagnoses:  Principal Problem:   Subdural hematoma (HCC) Active Problems:   Acute on chronic diastolic CHF (congestive heart failure) (HCC)   Hypertension   CAD  (coronary artery disease)   Hypercholesteremia   Dementia (HCC)  Principal discharge diagnosis: Worsening subdural hematoma in the setting of advanced dementia with overall poor prognosis.  Discharge Instructions  Discharge Instructions     Diet - low sodium heart healthy   Complete by: As directed    Increase activity slowly   Complete by: As directed       Allergies as of 10/24/2023       Reactions   Adhesive [tape] Other (See Comments)   Tears skin   Doxycycline  Nausea And Vomiting   Latex Itching, Rash        Medication List     STOP taking these medications    furosemide  40 MG tablet Commonly known as: LASIX        TAKE these medications    acetaminophen  500 MG tablet Commonly known as: TYLENOL  Take 2 tablets (1,000 mg total) by mouth every 8 (eight) hours as needed for mild pain, moderate pain or headache. What changed:  how much to take when to take this   albuterol  108 (90 Base) MCG/ACT inhaler Commonly known as: VENTOLIN  HFA Inhale 2 puffs into the lungs every 6 (six) hours as needed for wheezing or shortness of breath.   aspirin  81 MG chewable tablet Chew 1 tablet (81 mg total) by mouth daily.   atorvastatin  40 MG tablet Commonly known as: LIPITOR  Take 40 mg by mouth daily.   clopidogrel  75 MG tablet Commonly known as: PLAVIX  Take 1 tablet (75 mg total) by mouth daily.   donepezil  10 MG tablet Commonly known as: ARICEPT  Take 10 mg by mouth daily.   fluconazole 150  MG tablet Commonly known as: DIFLUCAN Take 150 mg by mouth daily as needed (recurrent yeast infections).   fluticasone  50 MCG/ACT nasal spray Commonly known as: FLONASE  Place 1 spray into both nostrils daily for 14 days.   HYDROcodone -acetaminophen  5-325 MG tablet Commonly known as: NORCO/VICODIN Take 0.5 tablets by mouth every 6 (six) hours as needed for moderate pain (pain score 4-6) or severe pain (pain score 7-10).   hydrOXYzine  50 MG capsule Commonly known as:  VISTARIL  Take 1 capsule by mouth daily.   lidocaine  5 % Commonly known as: LIDODERM  Place 1 patch onto the skin daily.   losartan  25 MG tablet Commonly known as: Cozaar  Take 1 tablet (25 mg total) by mouth daily.   melatonin 3 MG Tabs tablet Take 3 mg by mouth at bedtime.   metoprolol  succinate 100 MG 24 hr tablet Commonly known as: TOPROL -XL Take 100 mg by mouth daily.   potassium chloride  SA 20 MEQ tablet Commonly known as: KLOR-CON  M Take 20 mEq by mouth once.   QUEtiapine  25 MG tablet Commonly known as: SEROQUEL  Take 12.5 mg by mouth at bedtime.   venlafaxine  75 MG tablet Commonly known as: EFFEXOR  Take 75 mg by mouth 2 (two) times daily.        Contact information for follow-up providers     Eloy Half, [place] Margurette Shillings, NP. Schedule an appointment as soon as possible for a visit in 1 week(s).   Specialty: Nurse Practitioner Contact information: [address] [place] [address] [phone]              Contact information for after-discharge care     Destination     [facility] .   Service: Skilled Nursing Contact information: [address] [place]  [address] [phone]                    Allergies  Allergen Reactions   Adhesive [Tape] Other (See Comments)    Tears skin   Doxycycline  Nausea And Vomiting   Latex Itching and Rash    Consultations: Palliative care   Procedures/Studies: DG Chest Port 1 View Result Date: 10/21/2023 CLINICAL DATA:  Shortness of breath. EXAM: PORTABLE CHEST 1 VIEW COMPARISON:  09/08/2023 FINDINGS: Diffuse interstitial prominence again noted with a basilar predominance, right greater than left. No dense focal consolidative airspace disease or substantial pleural effusion. The cardiopericardial silhouette is within normal limits for size. No acute bony abnormality. Telemetry leads overlie the chest. IMPRESSION: Diffuse interstitial  prominence with a basilar predominance, right greater than left. While likely chronic, imaging features could be related to edema or infection. Electronically Signed   By: Donnal Fusi M.D.   On: 10/21/2023 06:30   CT HEAD WO CONTRAST ( ) Result Date: 10/21/2023 CLINICAL DATA:  83 year old female found with altered mental status, shortness of breath. EXAM: CT HEAD WITHOUT CONTRAST TECHNIQUE: Contiguous axial images were obtained from the base of the skull through the vertex without intravenous contrast. RADIATION DOSE REDUCTION: This exam was performed according to the departmental dose-optimization program which includes automated exposure control, adjustment of the mA and/or kV according to patient size and/or use of iterative reconstruction technique. COMPARISON:  Head CT 07/26/2023. FINDINGS: Brain: Left greater than right low-density subdural hematoma in February, left side subdural now septated, intermittently hyperdense, and larger measuring up to 2 cm in thickness (previously 14-15 mm (coronal image 28). Contralateral right side subdural remains hypodense and appears smaller, 4-5 mm (versus  8-9 mm previously). Intracranial mass effect with rightward midline shift not significantly changed, 5-6 mm (series 2, image 18 today versus series 3, image 19 previously). Stable partially effaced left lateral ventricle. Basilar cisterns remain patent. No new areas of intracranial hemorrhage. Stable gray-white matter differentiation throughout the brain. No cortically based acute infarct identified. Widespread cerebral white matter hypodensity including deep white matter capsule involvement. Vascular: Generalized intracranial artery tortuosity. Calcified atherosclerosis at the skull base. No suspicious intracranial vascular hyperdensity. Skull: Stable and intact. Sinuses/Orbits: Improved left middle ear and mastoid aeration since February. Otherwise stable paranasal sinuses, right middle ear and mastoid aeration.  Other: No acute orbit or scalp soft tissue finding. IMPRESSION: 1. Chronic Left subdural Hematoma with progressed loculation and hyperdense blood products since February, increased by up to 5 mm thickness (up to 20 mm now), although associated intracranial mass effect with 5-6 mm of rightward midline shift is stable. 2. Right side SDH remains low-density and is smaller, 4-5 mm now. 3. No other acute intracranial abnormality. Chronic white matter disease, intracranial artery tortuosity. Electronically Signed   By: Marlise Simpers M.D.   On: 10/21/2023 06:21     Discharge Exam: Vitals:   10/24/23 0527 10/24/23 0900  BP: (!) 169/112 (!) 204/108  Pulse: 96 (!) 101  Resp: 15 17  Temp:    SpO2: 96% 95%   Vitals:   10/23/23 1308 10/23/23 1958 10/24/23 0527 10/24/23 0900  BP: (!) 147/85 (!) 160/107 (!) 169/112 (!) 204/108  Pulse: 96 (!) 104 96 (!) 101  Resp: 18 17 15 17   Temp: 98.9 F (37.2 C) 98.7 F (37.1 C)    TempSrc: Oral     SpO2: 93% 96% 96% 95%  Weight:      Height:        General: Pt is alert, awake, not in acute distress Cardiovascular: RRR, S1/S2 +, no rubs, no gallops Respiratory: CTA bilaterally, no wheezing, no rhonchi Abdominal: Soft, NT, ND, bowel sounds + Extremities: no edema, no cyanosis    The results of significant diagnostics from this hospitalization (including imaging, microbiology, ancillary and laboratory) are listed below for reference.     Microbiology: Recent Results (from the past 240 hours)  Resp panel by RT-PCR (RSV, Flu A&B, Covid) Anterior Nasal Swab     Status: None   Collection Time: 10/21/23  6:19 AM   Specimen: Anterior Nasal Swab  Result Value Ref Range Status   SARS Coronavirus 2 by RT PCR NEGATIVE NEGATIVE Final    Comment: (NOTE) SARS-CoV-2 target nucleic acids are NOT DETECTED.  The SARS-CoV-2 RNA is generally detectable in upper respiratory specimens during the acute phase of infection. The lowest concentration of SARS-CoV-2 viral copies  this assay can detect is 138 copies/mL. A negative result does not preclude SARS-Cov-2 infection and should not be used as the sole basis for treatment or other patient management decisions. A negative result may occur with  improper specimen collection/handling, submission of specimen other than nasopharyngeal swab, presence of viral mutation(s) within the areas targeted by this assay, and inadequate number of viral copies(<138 copies/mL). A negative result must be combined with clinical observations, patient history, and epidemiological information. The expected result is Negative.  Fact Sheet for Patients:  BloggerCourse.com  Fact Sheet for Healthcare Providers:  SeriousBroker.it  This test is no t yet approved or cleared by the [place]  FDA and  has been authorized for detection and/or diagnosis of SARS-CoV-2 by FDA under an Emergency Use Authorization (EUA). This EUA  will remain  in effect (meaning this test can be used) for the duration of the COVID-19 declaration under Section 564(b)(1) of the Act, 21 U.S.C.section 360bbb-3(b)(1), unless the authorization is terminated  or revoked sooner.       Influenza A by PCR NEGATIVE NEGATIVE Final   Influenza B by PCR NEGATIVE NEGATIVE Final    Comment: (NOTE) The Xpert Xpress SARS-CoV-2/FLU/RSV plus assay is intended as an aid in the diagnosis of influenza from Nasopharyngeal swab specimens and should not be used as a sole basis for treatment. Nasal washings and aspirates are unacceptable for Xpert Xpress SARS-CoV-2/FLU/RSV testing.  Fact Sheet for Patients: BloggerCourse.com  Fact Sheet for Healthcare Providers: SeriousBroker.it  This test is not yet approved or cleared by the [place]  FDA and has been authorized for detection and/or diagnosis of SARS-CoV-2 by FDA under an Emergency Use Authorization (EUA). This EUA  will remain in effect (meaning this test can be used) for the duration of the COVID-19 declaration under Section 564(b)(1) of the Act, 21 U.S.C. section 360bbb-3(b)(1), unless the authorization is terminated or revoked.     Resp Syncytial Virus by PCR NEGATIVE NEGATIVE Final    Comment: (NOTE) Fact Sheet for Patients: BloggerCourse.com  Fact Sheet for Healthcare Providers: SeriousBroker.it  This test is not yet approved or cleared by the [place]  FDA and has been authorized for detection and/or diagnosis of SARS-CoV-2 by FDA under an Emergency Use Authorization (EUA). This EUA will remain in effect (meaning this test can be used) for the duration of the COVID-19 declaration under Section 564(b)(1) of the Act, 21 U.S.C. section 360bbb-3(b)(1), unless the authorization is terminated or revoked.  Performed at [facility], [address], [place], [place] [address]   MRSA Next Gen by PCR, Nasal     Status: None   Collection Time: 10/21/23  2:15 PM   Specimen: Nasal Mucosa; Nasal Swab  Result Value Ref Range Status   MRSA by PCR Next Gen NOT DETECTED NOT DETECTED Final    Comment: (NOTE) The GeneXpert MRSA Assay (FDA approved for NASAL specimens only), is one component of a comprehensive MRSA colonization surveillance program. It is not intended to diagnose MRSA infection nor to guide or monitor treatment for MRSA infections. Test performance is not FDA approved in patients less than 75 years old. Performed at [facility], [address], [place], [place] [address]      Labs: BNP (last 3 results) Recent Labs    09/09/23 0754 10/21/23 0604  BNP 1,240.0* 1,442.0*   Basic Metabolic Panel: Recent Labs  Lab 10/21/23 0604 10/22/23 0352 10/23/23 0424 10/24/23 0516  NA 139 138 135 140  K 4.4 3.4* 4.4 4.4  CL 102 103 101 104  CO2 28 25 26 27   GLUCOSE 111* 114* 99 94  BUN 25* 19 30* 26*  CREATININE 1.01* 0.95 1.50*  1.11*  CALCIUM  9.4 9.1 9.1 9.2  MG  --   --  2.1 2.1   Liver Function Tests: No results for input(s): "AST", "ALT", "ALKPHOS", "BILITOT", "PROT", "ALBUMIN" in the last 168 hours. No results for input(s): "LIPASE", "AMYLASE" in the last 168 hours. No results for input(s): "AMMONIA" in the last 168 hours. CBC: Recent Labs  Lab 10/21/23 0604 10/22/23 0352 10/23/23 0424 10/24/23 0516  WBC 9.1 9.9 8.5 10.0  NEUTROABS 6.5  --   --   --   HGB 11.2* 10.2* 10.7* 11.1*  HCT 35.6* 32.6* 33.5* 36.1  MCV 101.4* 98.8 100.3* 100.6*  PLT  232 225 233 234   Cardiac Enzymes: No results for input(s): "CKTOTAL", "CKMB", "CKMBINDEX", "TROPONINI" in the last 168 hours. BNP: Invalid input(s): "POCBNP" CBG: No results for input(s): "GLUCAP" in the last 168 hours. D-Dimer No results for input(s): "DDIMER" in the last 72 hours. Hgb A1c No results for input(s): "HGBA1C" in the last 72 hours. Lipid Profile No results for input(s): "CHOL", "HDL", "LDLCALC", "TRIG", "CHOLHDL", "LDLDIRECT" in the last 72 hours. Thyroid  function studies No results for input(s): "TSH", "T4TOTAL", "T3FREE", "THYROIDAB" in the last 72 hours.  Invalid input(s): "FREET3" Anemia work up No results for input(s): "VITAMINB12", "FOLATE", "FERRITIN", "TIBC", "IRON", "RETICCTPCT" in the last 72 hours. Urinalysis    Component Value Date/Time   COLORURINE STRAW (A) 10/21/2023 0604   APPEARANCEUR CLEAR 10/21/2023 0604   LABSPEC 1.010 10/21/2023 0604   PHURINE 7.0 10/21/2023 0604   GLUCOSEU NEGATIVE 10/21/2023 0604   HGBUR NEGATIVE 10/21/2023 0604   BILIRUBINUR NEGATIVE 10/21/2023 0604   KETONESUR NEGATIVE 10/21/2023 0604   PROTEINUR 100 (A) 10/21/2023 0604   UROBILINOGEN 0.2 11/30/2014 1221   NITRITE NEGATIVE 10/21/2023 0604   LEUKOCYTESUR NEGATIVE 10/21/2023 0604   Sepsis Labs Recent Labs  Lab 10/21/23 0604 10/22/23 0352 10/23/23 0424 10/24/23 0516  WBC 9.1 9.9 8.5 10.0   Microbiology Recent Results (from the  past 240 hours)  Resp panel by RT-PCR (RSV, Flu A&B, Covid) Anterior Nasal Swab     Status: None   Collection Time: 10/21/23  6:19 AM   Specimen: Anterior Nasal Swab  Result Value Ref Range Status   SARS Coronavirus 2 by RT PCR NEGATIVE NEGATIVE Final    Comment: (NOTE) SARS-CoV-2 target nucleic acids are NOT DETECTED.  The SARS-CoV-2 RNA is generally detectable in upper respiratory specimens during the acute phase of infection. The lowest concentration of SARS-CoV-2 viral copies this assay can detect is 138 copies/mL. A negative result does not preclude SARS-Cov-2 infection and should not be used as the sole basis for treatment or other patient management decisions. A negative result may occur with  improper specimen collection/handling, submission of specimen other than nasopharyngeal swab, presence of viral mutation(s) within the areas targeted by this assay, and inadequate number of viral copies(<138 copies/mL). A negative result must be combined with clinical observations, patient history, and epidemiological information. The expected result is Negative.  Fact Sheet for Patients:  BloggerCourse.com  Fact Sheet for Healthcare Providers:  SeriousBroker.it  This test is no t yet approved or cleared by the [place]  FDA and  has been authorized for detection and/or diagnosis of SARS-CoV-2 by FDA under an Emergency Use Authorization (EUA). This EUA will remain  in effect (meaning this test can be used) for the duration of the COVID-19 declaration under Section 564(b)(1) of the Act, 21 U.S.C.section 360bbb-3(b)(1), unless the authorization is terminated  or revoked sooner.       Influenza A by PCR NEGATIVE NEGATIVE Final   Influenza B by PCR NEGATIVE NEGATIVE Final    Comment: (NOTE) The Xpert Xpress SARS-CoV-2/FLU/RSV plus assay is intended as an aid in the diagnosis of influenza from Nasopharyngeal swab specimens  and should not be used as a sole basis for treatment. Nasal washings and aspirates are unacceptable for Xpert Xpress SARS-CoV-2/FLU/RSV testing.  Fact Sheet for Patients: BloggerCourse.com  Fact Sheet for Healthcare Providers: SeriousBroker.it  This test is not yet approved or cleared by the [place]  FDA and has been authorized for detection and/or diagnosis of SARS-CoV-2 by FDA under an Emergency Use Authorization (EUA).  This EUA will remain in effect (meaning this test can be used) for the duration of the COVID-19 declaration under Section 564(b)(1) of the Act, 21 U.S.C. section 360bbb-3(b)(1), unless the authorization is terminated or revoked.     Resp Syncytial Virus by PCR NEGATIVE NEGATIVE Final    Comment: (NOTE) Fact Sheet for Patients: BloggerCourse.com  Fact Sheet for Healthcare Providers: SeriousBroker.it  This test is not yet approved or cleared by the [place]  FDA and has been authorized for detection and/or diagnosis of SARS-CoV-2 by FDA under an Emergency Use Authorization (EUA). This EUA will remain in effect (meaning this test can be used) for the duration of the COVID-19 declaration under Section 564(b)(1) of the Act, 21 U.S.C. section 360bbb-3(b)(1), unless the authorization is terminated or revoked.  Performed at [facility], [address], [place], [place] [address]   MRSA Next Gen by PCR, Nasal     Status: None   Collection Time: 10/21/23  2:15 PM   Specimen: Nasal Mucosa; Nasal Swab  Result Value Ref Range Status   MRSA by PCR Next Gen NOT DETECTED NOT DETECTED Final    Comment: (NOTE) The GeneXpert MRSA Assay (FDA approved for NASAL specimens only), is one component of a comprehensive MRSA colonization surveillance program. It is not intended to diagnose MRSA infection nor to guide or monitor treatment for MRSA infections. Test  performance is not FDA approved in patients less than 43 years old. Performed at [facility], [address], [place], [place] [address]      Time coordinating discharge: 35 minutes  SIGNED:   Cornelius Dill, DO [facility] 10/24/2023, 9:53 AM  If 7PM-7AM, please contact night-coverage [email or web address]

## 2023-10-24 NOTE — Progress Notes (Signed)
Report given to nurse at [facility] 

## 2023-10-25 ENCOUNTER — Emergency Department (HOSPITAL_COMMUNITY)

## 2023-10-25 ENCOUNTER — Other Ambulatory Visit: Payer: Self-pay

## 2023-10-25 ENCOUNTER — Inpatient Hospital Stay (HOSPITAL_COMMUNITY)
Admission: EM | Admit: 2023-10-25 | Discharge: 2023-10-28 | DRG: 481 | Disposition: A | Discharge status: Skilled Nursing Facility | Attending: Student | Admitting: Student

## 2023-10-25 DIAGNOSIS — Z8249 Family history of ischemic heart disease and other diseases of the circulatory system: Secondary | ICD-10-CM

## 2023-10-25 DIAGNOSIS — Z87891 Personal history of nicotine dependence: Secondary | ICD-10-CM | POA: Diagnosis not present

## 2023-10-25 DIAGNOSIS — I129 Hypertensive chronic kidney disease with stage 1 through stage 4 chronic kidney disease, or unspecified chronic kidney disease: Secondary | ICD-10-CM | POA: Diagnosis present

## 2023-10-25 DIAGNOSIS — M25551 Pain in right hip: Secondary | ICD-10-CM | POA: Diagnosis present

## 2023-10-25 DIAGNOSIS — W19XXXA Unspecified fall, initial encounter: Principal | ICD-10-CM

## 2023-10-25 DIAGNOSIS — Z90710 Acquired absence of both cervix and uterus: Secondary | ICD-10-CM | POA: Diagnosis not present

## 2023-10-25 DIAGNOSIS — D62 Acute posthemorrhagic anemia: Secondary | ICD-10-CM | POA: Diagnosis not present

## 2023-10-25 DIAGNOSIS — I2510 Atherosclerotic heart disease of native coronary artery without angina pectoris: Secondary | ICD-10-CM | POA: Diagnosis present

## 2023-10-25 DIAGNOSIS — S72141A Displaced intertrochanteric fracture of right femur, initial encounter for closed fracture: Principal | ICD-10-CM | POA: Diagnosis present

## 2023-10-25 DIAGNOSIS — S065XAD Traumatic subdural hemorrhage with loss of consciousness status unknown, subsequent encounter: Secondary | ICD-10-CM

## 2023-10-25 DIAGNOSIS — Z7982 Long term (current) use of aspirin: Secondary | ICD-10-CM | POA: Diagnosis not present

## 2023-10-25 DIAGNOSIS — K219 Gastro-esophageal reflux disease without esophagitis: Secondary | ICD-10-CM | POA: Diagnosis present

## 2023-10-25 DIAGNOSIS — E7800 Pure hypercholesterolemia, unspecified: Secondary | ICD-10-CM | POA: Diagnosis present

## 2023-10-25 DIAGNOSIS — Z8673 Personal history of transient ischemic attack (TIA), and cerebral infarction without residual deficits: Secondary | ICD-10-CM

## 2023-10-25 DIAGNOSIS — Y92128 Other place in nursing home as the place of occurrence of the external cause: Secondary | ICD-10-CM

## 2023-10-25 DIAGNOSIS — Z79899 Other long term (current) drug therapy: Secondary | ICD-10-CM

## 2023-10-25 DIAGNOSIS — Z91040 Latex allergy status: Secondary | ICD-10-CM

## 2023-10-25 DIAGNOSIS — Z66 Do not resuscitate: Secondary | ICD-10-CM | POA: Diagnosis present

## 2023-10-25 DIAGNOSIS — N1832 Chronic kidney disease, stage 3b: Secondary | ICD-10-CM | POA: Diagnosis present

## 2023-10-25 DIAGNOSIS — Z881 Allergy status to other antibiotic agents status: Secondary | ICD-10-CM | POA: Diagnosis not present

## 2023-10-25 DIAGNOSIS — F0390 Unspecified dementia without behavioral disturbance: Secondary | ICD-10-CM | POA: Diagnosis present

## 2023-10-25 DIAGNOSIS — S7291XA Unspecified fracture of right femur, initial encounter for closed fracture: Secondary | ICD-10-CM | POA: Diagnosis present

## 2023-10-25 DIAGNOSIS — F03C0 Unspecified dementia, severe, without behavioral disturbance, psychotic disturbance, mood disturbance, and anxiety: Secondary | ICD-10-CM | POA: Diagnosis present

## 2023-10-25 DIAGNOSIS — H9190 Unspecified hearing loss, unspecified ear: Secondary | ICD-10-CM | POA: Diagnosis present

## 2023-10-25 DIAGNOSIS — Z91048 Other nonmedicinal substance allergy status: Secondary | ICD-10-CM

## 2023-10-25 DIAGNOSIS — Z7902 Long term (current) use of antithrombotics/antiplatelets: Secondary | ICD-10-CM | POA: Diagnosis not present

## 2023-10-25 DIAGNOSIS — W07XXXA Fall from chair, initial encounter: Secondary | ICD-10-CM | POA: Diagnosis present

## 2023-10-25 DIAGNOSIS — S065XAA Traumatic subdural hemorrhage with loss of consciousness status unknown, initial encounter: Secondary | ICD-10-CM | POA: Diagnosis not present

## 2023-10-25 DIAGNOSIS — I10 Essential (primary) hypertension: Secondary | ICD-10-CM | POA: Diagnosis present

## 2023-10-25 DIAGNOSIS — S06A0XD Traumatic brain compression without herniation, subsequent encounter: Secondary | ICD-10-CM

## 2023-10-25 DIAGNOSIS — D72829 Elevated white blood cell count, unspecified: Secondary | ICD-10-CM | POA: Diagnosis present

## 2023-10-25 LAB — CBC WITH DIFFERENTIAL/PLATELET
Abs Immature Granulocytes: 0.07 10*3/uL (ref 0.00–0.07)
Basophils Absolute: 0.1 10*3/uL (ref 0.0–0.1)
Basophils Relative: 1 %
Eosinophils Absolute: 0.1 10*3/uL (ref 0.0–0.5)
Eosinophils Relative: 1 %
HCT: 39.1 % (ref 36.0–46.0)
Hemoglobin: 12.4 g/dL (ref 12.0–15.0)
Immature Granulocytes: 1 %
Lymphocytes Relative: 13 %
Lymphs Abs: 1.7 10*3/uL (ref 0.7–4.0)
MCH: 31.7 pg (ref 26.0–34.0)
MCHC: 31.7 g/dL (ref 30.0–36.0)
MCV: 100 fL (ref 80.0–100.0)
Monocytes Absolute: 1.7 10*3/uL — ABNORMAL HIGH (ref 0.1–1.0)
Monocytes Relative: 12 %
Neutro Abs: 9.9 10*3/uL — ABNORMAL HIGH (ref 1.7–7.7)
Neutrophils Relative %: 72 %
Platelets: 226 10*3/uL (ref 150–400)
RBC: 3.91 MIL/uL (ref 3.87–5.11)
RDW: 13.5 % (ref 11.5–15.5)
WBC: 13.6 10*3/uL — ABNORMAL HIGH (ref 4.0–10.5)
nRBC: 0 % (ref 0.0–0.2)

## 2023-10-25 LAB — BASIC METABOLIC PANEL WITH GFR
Anion gap: 14 (ref 5–15)
BUN: 28 mg/dL — ABNORMAL HIGH (ref 8–23)
CO2: 22 mmol/L (ref 22–32)
Calcium: 9.5 mg/dL (ref 8.9–10.3)
Chloride: 101 mmol/L (ref 98–111)
Creatinine, Ser: 1.36 mg/dL — ABNORMAL HIGH (ref 0.44–1.00)
GFR, Estimated: 39 mL/min — ABNORMAL LOW (ref >60)
Glucose, Bld: 110 mg/dL — ABNORMAL HIGH (ref 70–99)
Potassium: 4.9 mmol/L (ref 3.5–5.1)
Sodium: 137 mmol/L (ref 135–145)

## 2023-10-25 LAB — TYPE AND SCREEN
ABO/RH(D): O POS
Antibody Screen: NEGATIVE

## 2023-10-25 LAB — PROTIME-INR
INR: 1 (ref 0.8–1.2)
Prothrombin Time: 13.7 s (ref 11.4–15.2)

## 2023-10-25 MED ORDER — ONDANSETRON HCL 4 MG/2ML IJ SOLN
4.0000 mg | Freq: Once | INTRAMUSCULAR | Status: AC
  Administered 2023-10-25: 4 mg via INTRAVENOUS
  Filled 2023-10-25: qty 2

## 2023-10-25 MED ORDER — METOPROLOL SUCCINATE ER 25 MG PO TB24
100.0000 mg | ORAL_TABLET | Freq: Once | ORAL | Status: AC
  Administered 2023-10-25: 100 mg via ORAL
  Filled 2023-10-25: qty 4

## 2023-10-25 MED ORDER — FENTANYL CITRATE PF 50 MCG/ML IJ SOSY
50.0000 ug | PREFILLED_SYRINGE | INTRAMUSCULAR | Status: DC | PRN
  Administered 2023-10-25: 50 ug via INTRAVENOUS
  Filled 2023-10-25: qty 1

## 2023-10-25 NOTE — ED Provider Notes (Addendum)
 [place] EMERGENCY DEPARTMENT AT [facility] Provider Note   CSN: [id number] Arrival date & time: 10/25/23  1716     History  Chief Complaint  Patient presents with   Fall   Hip Pain    Connie Wood is a 83 y.o. female.  Patient is an 83 year old female with a history of severe dementia, hypertension, hypercholesterolemia, stroke, chronic back pain who lives in a facility and is presenting today after she fell out of her chair yesterday and had shortening and rotating of her right leg.  She had an x-ray done in the facility yesterday but patient did not seem to be having significant pain however today she seemed like she was in much more pain and they transported for further evaluation.  Due to patient's dementia she is not able to give any useful information.  Speaking with her son Rodman Clam who is also her POA he reports that her baseline is nonsensical speech.  She does walk with a rollator and they did see her yesterday and she was in the chair.  He reports in the past they have been very hands-off with invasive procedures due to her dementia and functional status.  He reports he would like to talk to his sisters before they decide if they would want her to have surgery.  He is okay with her receiving medical care and pain control.  Based on her MAR she has not received any of her medications this morning.  Patient was just discharged from the hospital yesterday after being admitted for traumatic subdural hematoma.  The history is provided by the nursing home, the EMS personnel, medical records and a relative.  Fall  Hip Pain       Home Medications Prior to Admission medications   Medication Sig Start Date End Date Taking? Authorizing Provider  acetaminophen  (TYLENOL ) 500 MG tablet Take 2 tablets (1,000 mg total) by mouth every 8 (eight) hours as needed for mild pain, moderate pain or headache. Patient taking differently: Take 500 mg by mouth every 6 (six) hours.  04/25/18   Hongalgi, Anand D, MD  albuterol  (PROVENTIL  HFA;VENTOLIN  HFA) 108 (90 Base) MCG/ACT inhaler Inhale 2 puffs into the lungs every 6 (six) hours as needed for wheezing or shortness of breath. 04/25/18   Hongalgi, Anand D, MD  aspirin  81 MG chewable tablet Chew 1 tablet (81 mg total) by mouth daily. 04/25/18   Hongalgi, Anand D, MD  atorvastatin  (LIPITOR ) 40 MG tablet Take 40 mg by mouth daily.    [provider]  clopidogrel  (PLAVIX ) 75 MG tablet Take 1 tablet (75 mg total) by mouth daily. 04/26/18   Hongalgi, Anand D, MD  donepezil  (ARICEPT ) 10 MG tablet Take 10 mg by mouth daily. 08/14/23   [provider]  fluconazole (DIFLUCAN) 150 MG tablet Take 150 mg by mouth daily as needed (recurrent yeast infections).    [provider]  fluticasone  (FLONASE ) 50 MCG/ACT nasal spray Place 1 spray into both nostrils daily for 14 days. 03/05/20 10/21/23  Avegno, Komlanvi S, FNP  HYDROcodone -acetaminophen  (NORCO/VICODIN) 5-325 MG tablet Take 0.5 tablets by mouth every 6 (six) hours as needed for moderate pain (pain score 4-6) or severe pain (pain score 7-10). 10/24/23   Mason Sole, Pratik D, DO  hydrOXYzine  (VISTARIL ) 50 MG capsule Take 1 capsule by mouth daily.    [provider]  lidocaine  (LIDODERM ) 5 % Place 1 patch onto the skin daily. 08/14/23   [provider]  losartan  (COZAAR ) 25  MG tablet Take 1 tablet (25 mg total) by mouth daily. 09/12/23 09/11/24  Justina Oman, MD  melatonin 3 MG TABS tablet Take 3 mg by mouth at bedtime.    [provider]  metoprolol  succinate (TOPROL -XL) 100 MG 24 hr tablet Take 100 mg by mouth daily. 08/14/23   [provider]  potassium chloride  SA (KLOR-CON  M) 20 MEQ tablet Take 20 mEq by mouth once.    [provider]  QUEtiapine  (SEROQUEL ) 25 MG tablet Take 12.5 mg by mouth at bedtime. 08/14/23   [provider]  venlafaxine  (EFFEXOR ) 75 MG tablet Take 75 mg by mouth 2 (two) times daily. 08/14/23    [provider]      Allergies    Adhesive [tape], Doxycycline , and Latex    Review of Systems   Review of Systems  Physical Exam Updated Vital Signs BP (!) 154/91   Pulse (!) 119   Temp 99.6 F (37.6 C) (Oral)   Resp 20   SpO2 96%  Physical Exam Vitals and nursing note reviewed.  Constitutional:      General: She is not in acute distress.    Appearance: She is well-developed.  HENT:     Head: Normocephalic and atraumatic.  Eyes:     Pupils: Pupils are equal, round, and reactive to light.  Cardiovascular:     Rate and Rhythm: Regular rhythm. Tachycardia present.     Pulses: Normal pulses.     Heart sounds: Normal heart sounds. No murmur heard.    No friction rub.  Pulmonary:     Effort: Pulmonary effort is normal.     Breath sounds: Normal breath sounds. No wheezing or rales.  Abdominal:     General: Bowel sounds are normal. There is no distension.     Palpations: Abdomen is soft.     Tenderness: There is no abdominal tenderness. There is no guarding or rebound.  Musculoskeletal:        General: Tenderness present.     Right hip: Deformity and tenderness present. Decreased range of motion.     Comments: Right leg is shortened and rotated  Skin:    General: Skin is warm and dry.     Findings: No rash.  Neurological:     Mental Status: She is alert. Mental status is at baseline.     Cranial Nerves: No cranial nerve deficit.     Comments: Awake and alert  Psychiatric:        Behavior: Behavior normal.     ED Results / Procedures / Treatments   Labs (all labs ordered are listed, but only abnormal results are displayed) Labs Reviewed  BASIC METABOLIC PANEL WITH GFR - Abnormal; Notable for the following components:      Result Value   Glucose, Bld 110 (*)    BUN 28 (*)    Creatinine, Ser 1.36 (*)    GFR, Estimated 39 (*)    All other components within normal limits  CBC WITH DIFFERENTIAL/PLATELET - Abnormal; Notable for the following components:    WBC 13.6 (*)    Neutro Abs 9.9 (*)    Monocytes Absolute 1.7 (*)    All other components within normal limits  PROTIME-INR  TYPE AND SCREEN    EKG EKG Interpretation Date/Time:  Saturday Oct 25 2023 17:29:27 EDT Ventricular Rate:  117 PR Interval:  135 QRS Duration:  82 QT Interval:  322 QTC Calculation: 450 R Axis:   48  Text Interpretation: Sinus tachycardia  Left ventricular hypertrophy Nonspecific T abnormalities, lateral leads No significant change since last tracing Confirmed by Almond Army ([id number]) on 10/25/2023 5:59:57 PM  Radiology CT Head Wo Contrast Result Date: 10/25/2023 CLINICAL DATA:  Status post fall with head trauma EXAM: CT HEAD WITHOUT CONTRAST TECHNIQUE: Contiguous axial images were obtained from the base of the skull through the vertex without intravenous contrast. RADIATION DOSE REDUCTION: This exam was performed according to the departmental dose-optimization program which includes automated exposure control, adjustment of the mA and/or kV according to patient size and/or use of iterative reconstruction technique. COMPARISON:  CT head 10/21/2023 FINDINGS: Brain: No substantial change from 10/21/2023. Left-sided mixed density subdural hematoma with septations on the left. This measures up to 2.2 cm in thickness, unchanged using similar measuring technique. Small low-density right-sided subdural hematoma is also unchanged measuring up to 6 mm in radial dimension. 5 mm of rightward midline shift, unchanged. No new hemorrhage. No evidence of acute infarct. Stable ventricular caliber. Vascular: No hyperdense vessel or unexpected calcification. Skull: No acute fracture. Sinuses/Orbits: Chronic opacification of a few mastoid air cells bilaterally. Mucosal thickening in the right maxillary sinus. Paranasal sinuses are otherwise well aerated. Other: None. IMPRESSION: 1. No substantial change from 10/21/2023. 2. Left-sided mixed density subdural hematoma with septations measuring  up to 2.2 cm in thickness. 3. Small low-density right-sided subdural hematoma measuring up to 6 mm in radial dimension. 4. 5 mm of rightward midline shift. Electronically Signed   By: Rozell Cornet M.D.   On: 10/25/2023 21:16   DG Hip Unilat With Pelvis 2-3 Views Right Result Date: 10/25/2023 CLINICAL DATA:  Trauma and hip shortening EXAM: DG HIP (WITH OR WITHOUT PELVIS) 2-3V RIGHT COMPARISON:  CT of 07/26/2023 FINDINGS: Vascular calcifications. Femoral heads are located. Osteopenia. Prior sacral plasty. Lower lumbar spondylosis. Artifact degraded attempted lateral views. Mildly comminuted right femoral neck fracture with varus angulation and impaction. Likely at the level of the base of the femoral neck versus intertrochanteric region. IMPRESSION: Impacted, varus angulated right proximal femur fracture. Electronically Signed   By: Lore Rode M.D.   On: 10/25/2023 18:15   DG Chest Port 1 View Result Date: 10/25/2023 CLINICAL DATA:  Shortness of breath.  Trauma. EXAM: PORTABLE CHEST 1 VIEW COMPARISON:  Chest CT dated 09/09/2023. FINDINGS: No focal consolidation, pleural effusion or pneumothorax. The cardiac silhouette is within normal limits. Atherosclerotic calcification of the aortic arch. No acute osseous pathology. IMPRESSION: No active disease. Electronically Signed   By: Angus Bark M.D.   On: 10/25/2023 18:13    Procedures Procedures    Medications Ordered in ED Medications  fentaNYL  (SUBLIMAZE ) injection 50 mcg (50 mcg Intravenous Given 10/25/23 1808)  ondansetron  (ZOFRAN ) injection 4 mg (4 mg Intravenous Given 10/25/23 1807)  metoprolol  succinate (TOPROL -XL) 24 hr tablet 100 mg (100 mg Oral Given 10/25/23 2013)    ED Course/ Medical Decision Making/ A&P                                 Medical Decision Making Amount and/or Complexity of Data Reviewed Independent Historian:     Details: POA - Brad...her son External Data Reviewed: notes. Labs: ordered. Decision-making details  documented in ED Course. Radiology: ordered and independent interpretation performed. Decision-making details documented in ED Course. ECG/medicine tests: ordered and independent interpretation performed. Decision-making details documented in ED Course.  Risk Prescription drug management. Decision regarding hospitalization.   Pt with multiple medical problems and  comorbidities and presenting today with a complaint that caries a high risk for morbidity and mortality.  Here today for the above issues.  Patient has obvious signs of deformity of the right leg and concern for fracture.  She does not appear to have injury anywhere else.  Talking with her son Rodman Clam it seems that she is mentating at her baseline.  This is nonsensical talking.  Patient was tachycardic and hypertensive however feel that was most likely related to pain as her vital signs have improved with pain control.  I independently interpreted patient's EKG and labs.  EKG today shows sinus tachycardia, BMP with a creatinine of 1.36 which is patient's baseline, CBC with mild leukocytosis of 13 and stable hemoglobin.  Coags are within normal limits.  I have independently visualized and interpreted pt's images today.  Chest x-ray without acute findings and hip image with a femur fracture.  Radiology reports impacted valgus angulated right proximal femur fracture.  Given patient was just here after having a traumatic subdural hemorrhage we will rescan her head to ensure there is no worsening of that.  Spoke with Dr. Curtiss Dowdy with orthopedics and he reports they would plan on doing repair tomorrow.  Will consult the hospitalist for admission.  She will need to be n.p.o. after midnight.  9:28 PM CT head with subdural hemorrhage but no acute changes from prior.          Final Clinical Impression(s) / ED Diagnoses Final diagnoses:  Fall, initial encounter  Closed 2-part intertrochanteric fracture of right femur, initial encounter [facility])     Rx / DC Orders ED Discharge Orders     None         Almond Army, MD 10/25/23 1947    Almond Army, MD 10/25/23 2128

## 2023-10-25 NOTE — Progress Notes (Signed)
 Ortho Note  Received consult from Dr. Leida Puna for displaced basicervical femoral neck fracture. Patient with dementia and on hospice but family per report will like to proceed with fixation. We will plan to perform tomorrow AM. NPO after midnight. Consult to follow tomorrow AM.  Laneta Pintos, MD [facility] [phone] (office) [email or web address]

## 2023-10-25 NOTE — ED Triage Notes (Signed)
 Patient with hx of dementia from [facility] where yesterday family put her in a chair and then staff found her on the floor shortly thereafter. Facility did xr which shows hip fx. Shortening and rotation noted to R leg.

## 2023-10-25 NOTE — ED Notes (Signed)
 Oxygen  saturations dipped shortly after administration of pain medication. Pt breathing through mouth so NRB placed instead of Veteran.

## 2023-10-25 NOTE — ED Notes (Addendum)
 Pillow placed slightly under left hip to change positioning. Pt resting comfortably at this time.

## 2023-10-26 ENCOUNTER — Inpatient Hospital Stay (HOSPITAL_COMMUNITY)

## 2023-10-26 ENCOUNTER — Inpatient Hospital Stay (HOSPITAL_COMMUNITY): Admitting: Certified Registered"

## 2023-10-26 ENCOUNTER — Encounter (HOSPITAL_COMMUNITY)
Admission: EM | Disposition: A | Discharge status: Skilled Nursing Facility | Payer: Self-pay | Source: Home / Self Care | Attending: Student

## 2023-10-26 ENCOUNTER — Other Ambulatory Visit: Payer: Self-pay

## 2023-10-26 ENCOUNTER — Encounter (HOSPITAL_COMMUNITY): Payer: Self-pay | Admitting: Internal Medicine

## 2023-10-26 DIAGNOSIS — Z87891 Personal history of nicotine dependence: Secondary | ICD-10-CM

## 2023-10-26 DIAGNOSIS — I10 Essential (primary) hypertension: Secondary | ICD-10-CM

## 2023-10-26 DIAGNOSIS — G935 Compression of brain: Secondary | ICD-10-CM

## 2023-10-26 DIAGNOSIS — S7291XA Unspecified fracture of right femur, initial encounter for closed fracture: Secondary | ICD-10-CM

## 2023-10-26 DIAGNOSIS — S72141A Displaced intertrochanteric fracture of right femur, initial encounter for closed fracture: Secondary | ICD-10-CM

## 2023-10-26 DIAGNOSIS — S065XAA Traumatic subdural hemorrhage with loss of consciousness status unknown, initial encounter: Secondary | ICD-10-CM

## 2023-10-26 DIAGNOSIS — I2510 Atherosclerotic heart disease of native coronary artery without angina pectoris: Secondary | ICD-10-CM | POA: Diagnosis not present

## 2023-10-26 HISTORY — PX: INTRAMEDULLARY (IM) NAIL INTERTROCHANTERIC: SHX5875

## 2023-10-26 LAB — BASIC METABOLIC PANEL WITH GFR
Anion gap: 10 (ref 5–15)
BUN: 28 mg/dL — ABNORMAL HIGH (ref 8–23)
CO2: 23 mmol/L (ref 22–32)
Calcium: 9 mg/dL (ref 8.9–10.3)
Chloride: 105 mmol/L (ref 98–111)
Creatinine, Ser: 1.38 mg/dL — ABNORMAL HIGH (ref 0.44–1.00)
GFR, Estimated: 38 mL/min — ABNORMAL LOW (ref >60)
Glucose, Bld: 110 mg/dL — ABNORMAL HIGH (ref 70–99)
Potassium: 4.5 mmol/L (ref 3.5–5.1)
Sodium: 138 mmol/L (ref 135–145)

## 2023-10-26 LAB — CBC
HCT: 33.7 % — ABNORMAL LOW (ref 36.0–46.0)
Hemoglobin: 10.7 g/dL — ABNORMAL LOW (ref 12.0–15.0)
MCH: 31.5 pg (ref 26.0–34.0)
MCHC: 31.8 g/dL (ref 30.0–36.0)
MCV: 99.1 fL (ref 80.0–100.0)
Platelets: 192 10*3/uL (ref 150–400)
RBC: 3.4 MIL/uL — ABNORMAL LOW (ref 3.87–5.11)
RDW: 13.4 % (ref 11.5–15.5)
WBC: 10.6 10*3/uL — ABNORMAL HIGH (ref 4.0–10.5)
nRBC: 0 % (ref 0.0–0.2)

## 2023-10-26 LAB — MAGNESIUM: Magnesium: 2.1 mg/dL (ref 1.7–2.4)

## 2023-10-26 LAB — VITAMIN D 25 HYDROXY (VIT D DEFICIENCY, FRACTURES): Vit D, 25-Hydroxy: 38.59 ng/mL (ref 30–100)

## 2023-10-26 LAB — PHOSPHORUS: Phosphorus: 4.6 mg/dL (ref 2.5–4.6)

## 2023-10-26 SURGERY — FIXATION, FRACTURE, INTERTROCHANTERIC, WITH INTRAMEDULLARY ROD
Anesthesia: General | Laterality: Right

## 2023-10-26 MED ORDER — POLYETHYLENE GLYCOL 3350 17 G PO PACK: 17.0000 g | PACK | Freq: Every day | ORAL | Status: DC | PRN

## 2023-10-26 MED ORDER — ASPIRIN 81 MG PO CHEW
81.0000 mg | CHEWABLE_TABLET | Freq: Every day | ORAL | Status: DC
  Filled 2023-10-26: qty 1

## 2023-10-26 MED ORDER — OXYCODONE HCL 5 MG PO TABS: 5.0000 mg | ORAL_TABLET | Freq: Once | ORAL | Status: DC | PRN

## 2023-10-26 MED ORDER — ORAL CARE MOUTH RINSE
15.0000 mL | Freq: Once | OROMUCOSAL | Status: DC
Start: 2023-10-26 — End: 2023-10-26

## 2023-10-26 MED ORDER — CEFAZOLIN SODIUM-DEXTROSE 2-4 GM/100ML-% IV SOLN
2.0000 g | Freq: Three times a day (TID) | INTRAVENOUS | Status: AC
  Administered 2023-10-26 – 2023-10-27 (×3): 2 g via INTRAVENOUS
  Filled 2023-10-26 (×3): qty 100

## 2023-10-26 MED ORDER — METHOCARBAMOL 500 MG PO TABS
500.0000 mg | ORAL_TABLET | Freq: Four times a day (QID) | ORAL | Status: DC | PRN
  Administered 2023-10-26 – 2023-10-27 (×2): 500 mg via ORAL
  Filled 2023-10-26 (×2): qty 1

## 2023-10-26 MED ORDER — LIDOCAINE 2% (20 MG/ML) 5 ML SYRINGE
INTRAMUSCULAR | Status: AC
  Filled 2023-10-26: qty 5

## 2023-10-26 MED ORDER — MORPHINE SULFATE (PF) 2 MG/ML IV SOLN: 0.5000 mg | INTRAVENOUS | Status: DC | PRN

## 2023-10-26 MED ORDER — MELATONIN 5 MG PO TABS
5.0000 mg | ORAL_TABLET | Freq: Every evening | ORAL | Status: DC | PRN
  Administered 2023-10-27: 5 mg via ORAL
  Filled 2023-10-26: qty 1

## 2023-10-26 MED ORDER — 0.9 % SODIUM CHLORIDE (POUR BTL) OPTIME
TOPICAL | Status: DC | PRN
  Administered 2023-10-26: 1000 mL

## 2023-10-26 MED ORDER — ACETAMINOPHEN 500 MG PO TABS
1000.0000 mg | ORAL_TABLET | Freq: Four times a day (QID) | ORAL | Status: DC
  Administered 2023-10-26 – 2023-10-28 (×5): 1000 mg via ORAL
  Filled 2023-10-26 (×6): qty 2

## 2023-10-26 MED ORDER — FENTANYL CITRATE (PF) 100 MCG/2ML IJ SOLN: 25.0000 ug | INTRAMUSCULAR | Status: DC | PRN

## 2023-10-26 MED ORDER — LOSARTAN POTASSIUM 50 MG PO TABS
25.0000 mg | ORAL_TABLET | Freq: Every day | ORAL | Status: DC
  Administered 2023-10-26: 25 mg via ORAL
  Filled 2023-10-26: qty 1

## 2023-10-26 MED ORDER — DEXAMETHASONE SODIUM PHOSPHATE 10 MG/ML IJ SOLN
INTRAMUSCULAR | Status: AC
  Filled 2023-10-26: qty 1

## 2023-10-26 MED ORDER — VASOPRESSIN 20 UNIT/ML IV SOLN
INTRAVENOUS | Status: AC
Start: 2023-10-26 — End: ?
  Filled 2023-10-26: qty 1

## 2023-10-26 MED ORDER — ROCURONIUM BROMIDE 10 MG/ML (PF) SYRINGE
PREFILLED_SYRINGE | INTRAVENOUS | Status: AC
  Filled 2023-10-26: qty 10

## 2023-10-26 MED ORDER — PHENYLEPHRINE HCL-NACL 20-0.9 MG/250ML-% IV SOLN
INTRAVENOUS | Status: DC | PRN
  Administered 2023-10-26: 50 ug/min via INTRAVENOUS

## 2023-10-26 MED ORDER — LACTATED RINGERS IV SOLN: INTRAVENOUS | Status: DC

## 2023-10-26 MED ORDER — FENTANYL CITRATE (PF) 100 MCG/2ML IJ SOLN
INTRAMUSCULAR | Status: AC
  Filled 2023-10-26: qty 2

## 2023-10-26 MED ORDER — VANCOMYCIN HCL 1000 MG IV SOLR
INTRAVENOUS | Status: AC
  Filled 2023-10-26: qty 20

## 2023-10-26 MED ORDER — PHENYLEPHRINE 80 MCG/ML (10ML) SYRINGE FOR IV PUSH (FOR BLOOD PRESSURE SUPPORT)
PREFILLED_SYRINGE | INTRAVENOUS | Status: AC
  Filled 2023-10-26: qty 10

## 2023-10-26 MED ORDER — EPHEDRINE 5 MG/ML INJ
INTRAVENOUS | Status: AC
  Filled 2023-10-26: qty 5

## 2023-10-26 MED ORDER — SODIUM CHLORIDE 0.9 % IV SOLN: INTRAVENOUS | Status: DC

## 2023-10-26 MED ORDER — CEFAZOLIN SODIUM-DEXTROSE 2-3 GM-%(50ML) IV SOLR
INTRAVENOUS | Status: DC | PRN
  Administered 2023-10-26: 2 g via INTRAVENOUS

## 2023-10-26 MED ORDER — DEXAMETHASONE SODIUM PHOSPHATE 10 MG/ML IJ SOLN
INTRAMUSCULAR | Status: DC | PRN
Start: 2023-10-26 — End: 2023-10-26
  Administered 2023-10-26: 5 mg via INTRAVENOUS

## 2023-10-26 MED ORDER — LIDOCAINE 2% (20 MG/ML) 5 ML SYRINGE
INTRAMUSCULAR | Status: DC | PRN
  Administered 2023-10-26: 60 mg via INTRAVENOUS

## 2023-10-26 MED ORDER — DOCUSATE SODIUM 100 MG PO CAPS
100.0000 mg | ORAL_CAPSULE | Freq: Two times a day (BID) | ORAL | Status: DC
  Administered 2023-10-26 – 2023-10-28 (×5): 100 mg via ORAL
  Filled 2023-10-26 (×5): qty 1

## 2023-10-26 MED ORDER — ACETAMINOPHEN 325 MG PO TABS: 650.0000 mg | ORAL_TABLET | Freq: Four times a day (QID) | ORAL | Status: DC | PRN

## 2023-10-26 MED ORDER — ROCURONIUM BROMIDE 100 MG/10ML IV SOLN
INTRAVENOUS | Status: DC | PRN
  Administered 2023-10-26: 40 mg via INTRAVENOUS

## 2023-10-26 MED ORDER — VASOPRESSIN 20 UNIT/ML IV SOLN
INTRAVENOUS | Status: DC | PRN
  Administered 2023-10-26: 1 [IU] via INTRAVENOUS

## 2023-10-26 MED ORDER — PROPOFOL 10 MG/ML IV BOLUS
INTRAVENOUS | Status: AC
  Filled 2023-10-26: qty 20

## 2023-10-26 MED ORDER — ONDANSETRON HCL 4 MG/2ML IJ SOLN: 4.0000 mg | Freq: Four times a day (QID) | INTRAMUSCULAR | Status: DC | PRN

## 2023-10-26 MED ORDER — ONDANSETRON HCL 4 MG/2ML IJ SOLN
INTRAMUSCULAR | Status: DC | PRN
  Administered 2023-10-26: 4 mg via INTRAVENOUS

## 2023-10-26 MED ORDER — METHOCARBAMOL 1000 MG/10ML IJ SOLN: 500.0000 mg | Freq: Four times a day (QID) | INTRAMUSCULAR | Status: DC | PRN

## 2023-10-26 MED ORDER — PROCHLORPERAZINE EDISYLATE 10 MG/2ML IJ SOLN: 5.0000 mg | Freq: Four times a day (QID) | INTRAMUSCULAR | Status: DC | PRN

## 2023-10-26 MED ORDER — SUGAMMADEX SODIUM 200 MG/2ML IV SOLN
INTRAVENOUS | Status: DC | PRN
Start: 2023-10-26 — End: 2023-10-26
  Administered 2023-10-26: 100 mg via INTRAVENOUS

## 2023-10-26 MED ORDER — OXYCODONE HCL 5 MG PO TABS: 5.0000 mg | ORAL_TABLET | Freq: Four times a day (QID) | ORAL | Status: DC | PRN

## 2023-10-26 MED ORDER — HYDROCODONE-ACETAMINOPHEN 5-325 MG PO TABS: 1.0000 | ORAL_TABLET | ORAL | Status: DC | PRN

## 2023-10-26 MED ORDER — PHENYLEPHRINE 80 MCG/ML (10ML) SYRINGE FOR IV PUSH (FOR BLOOD PRESSURE SUPPORT)
PREFILLED_SYRINGE | INTRAVENOUS | Status: DC | PRN
  Administered 2023-10-26 (×2): 160 ug via INTRAVENOUS

## 2023-10-26 MED ORDER — ALBUMIN HUMAN 5 % IV SOLN
INTRAVENOUS | Status: DC | PRN
Start: 2023-10-26 — End: 2023-10-26

## 2023-10-26 MED ORDER — CHLORHEXIDINE GLUCONATE 0.12 % MT SOLN
15.0000 mL | Freq: Once | OROMUCOSAL | Status: DC
Start: 2023-10-26 — End: 2023-10-26

## 2023-10-26 MED ORDER — TRANEXAMIC ACID-NACL 1000-0.7 MG/100ML-% IV SOLN
INTRAVENOUS | Status: AC
Start: 2023-10-26 — End: 2023-10-26
  Filled 2023-10-26: qty 100

## 2023-10-26 MED ORDER — PROPOFOL 10 MG/ML IV BOLUS
INTRAVENOUS | Status: DC | PRN
  Administered 2023-10-26: 70 mg via INTRAVENOUS

## 2023-10-26 MED ORDER — TRANEXAMIC ACID-NACL 1000-0.7 MG/100ML-% IV SOLN
1000.0000 mg | Freq: Once | INTRAVENOUS | Status: DC
  Filled 2023-10-26: qty 100

## 2023-10-26 MED ORDER — OXYCODONE HCL 5 MG/5ML PO SOLN: 5.0000 mg | Freq: Once | ORAL | Status: DC | PRN

## 2023-10-26 MED ORDER — LABETALOL HCL 5 MG/ML IV SOLN: 5.0000 mg | INTRAVENOUS | Status: DC | PRN

## 2023-10-26 MED ORDER — FENTANYL CITRATE (PF) 250 MCG/5ML IJ SOLN
INTRAMUSCULAR | Status: DC | PRN
  Administered 2023-10-26: 25 ug via INTRAVENOUS
  Administered 2023-10-26: 50 ug via INTRAVENOUS

## 2023-10-26 MED ORDER — CEFAZOLIN SODIUM-DEXTROSE 2-4 GM/100ML-% IV SOLN
INTRAVENOUS | Status: AC
  Filled 2023-10-26: qty 100

## 2023-10-26 MED ORDER — ONDANSETRON HCL 4 MG/2ML IJ SOLN
INTRAMUSCULAR | Status: AC
  Filled 2023-10-26: qty 2

## 2023-10-26 SURGICAL SUPPLY — 39 items
BAG COUNTER SPONGE SURGICOUNT (BAG) IMPLANT
BIT DRILL INTERTAN LAG SCREW (BIT) IMPLANT
BIT DRILL LONG 4.0 (BIT) IMPLANT
BRUSH SCRUB EZ PLAIN DRY (MISCELLANEOUS) ×2 IMPLANT
CHLORAPREP W/TINT 26 (MISCELLANEOUS) ×1 IMPLANT
COVER PERINEAL POST (MISCELLANEOUS) ×1 IMPLANT
COVER SURGICAL LIGHT HANDLE (MISCELLANEOUS) ×1 IMPLANT
DERMABOND ADVANCED .7 DNX12 (GAUZE/BANDAGES/DRESSINGS) ×1 IMPLANT
DRAPE C-ARM 35X43 STRL (DRAPES) ×1 IMPLANT
DRAPE IMP U-DRAPE 54X76 (DRAPES) ×2 IMPLANT
DRAPE INCISE IOBAN 66X45 STRL (DRAPES) ×1 IMPLANT
DRAPE STERI IOBAN 125X83 (DRAPES) ×1 IMPLANT
DRAPE SURG 17X23 STRL (DRAPES) ×2 IMPLANT
DRAPE U-SHAPE 47X51 STRL (DRAPES) ×1 IMPLANT
DRESSING MEPILEX FLEX 4X4 (GAUZE/BANDAGES/DRESSINGS) ×1 IMPLANT
DRSG MEPILEX POST OP 4X8 (GAUZE/BANDAGES/DRESSINGS) ×1 IMPLANT
DRSG TEGADERM 4X4.75 (GAUZE/BANDAGES/DRESSINGS) IMPLANT
ELECTRODE REM PT RTRN 9FT ADLT (ELECTROSURGICAL) ×1 IMPLANT
GAUZE SPONGE 4X4 12PLY STRL (GAUZE/BANDAGES/DRESSINGS) IMPLANT
GLOVE BIO SURGEON STRL SZ 6.5 (GLOVE) ×3 IMPLANT
GLOVE BIO SURGEON STRL SZ7.5 (GLOVE) ×4 IMPLANT
GLOVE BIOGEL PI IND STRL 6.5 (GLOVE) ×1 IMPLANT
GLOVE BIOGEL PI IND STRL 7.5 (GLOVE) ×1 IMPLANT
GOWN STRL REUS W/ TWL LRG LVL3 (GOWN DISPOSABLE) ×1 IMPLANT
KIT BASIN OR (CUSTOM PROCEDURE TRAY) ×1 IMPLANT
KIT TURNOVER KIT B (KITS) ×1 IMPLANT
MANIFOLD NEPTUNE II (INSTRUMENTS) ×1 IMPLANT
NAIL INTERTAN 10X18 130D 10S (Nail) IMPLANT
NS IRRIG 1000ML POUR BTL (IV SOLUTION) ×1 IMPLANT
PACK GENERAL/GYN (CUSTOM PROCEDURE TRAY) ×1 IMPLANT
PAD ARMBOARD POSITIONER FOAM (MISCELLANEOUS) ×2 IMPLANT
PIN GUIDE 3.2X343MM (PIN) IMPLANT
SCREW LAG COMPR KIT 100/95 (Screw) IMPLANT
SCREW TRIGEN LOW PROF 5.0X30 (Screw) IMPLANT
SUT MNCRL AB 3-0 PS2 18 (SUTURE) ×1 IMPLANT
SUT VIC AB 0 CT1 27XBRD ANBCTR (SUTURE) IMPLANT
SUT VIC AB 2-0 CT1 TAPERPNT 27 (SUTURE) ×2 IMPLANT
TOWEL GREEN STERILE (TOWEL DISPOSABLE) ×2 IMPLANT
WATER STERILE IRR 1000ML POUR (IV SOLUTION) ×1 IMPLANT

## 2023-10-26 NOTE — Progress Notes (Signed)
 Admission documentation not done,patient is confused,disoriented sleepy and not able to answer questions.

## 2023-10-26 NOTE — Transfer of Care (Signed)
 Immediate Anesthesia Transfer of Care Note  Patient: Connie Wood  Procedure(s) Performed: FIXATION, FRACTURE, INTERTROCHANTERIC, WITH INTRAMEDULLARY ROD (Right)  Patient Location: PACU  Anesthesia Type:General  Level of Consciousness: sedated  Airway & Oxygen  Therapy: Patient Spontanous Breathing and Patient connected to face mask oxygen   Post-op Assessment: Report given to RN and Post -op Vital signs reviewed and stable  Post vital signs: Reviewed and stable  Last Vitals:  Vitals Value Taken Time  BP 173/86 10/26/23 0832  Temp 37.1 C 10/26/23 0832  Pulse 80 10/26/23 0834  Resp 17 10/26/23 0834  SpO2 100 % 10/26/23 0834  Vitals shown include unfiled device data.  Last Pain:  Vitals:   10/26/23 0701  TempSrc: Axillary  PainSc:          Complications: No notable events documented.

## 2023-10-26 NOTE — Consult Note (Signed)
 [facility] ([facility]) Consult   Patient ID: Connie Wood MRN: [id number] DOB/AGE: 83-Aug-1942 83 y.o.  Reason for Consult:Right intertrochanteric femur fracture Referring Physician: Dr. Almond Army, MD [facility]  HPI: [name] is an 83 y.o. female who is being seen in consultation at the request of Dr. Leida Puna for evaluation of right hip fracture.  The patient had ground-level fall at her nursing facility.  She was recently discharged from the hospital on hospice due to a subdural hematoma and advanced dementia.  She had a shortened and externally rotated leg and was brought to the emergency room where x-rays showed the hip fracture.  I was consulted.  The patient has had a declining status over the last few months and was recently admitted as noted above.  Discussions were had with the patient's family specifically her son and they wish to proceed with surgical intervention for comfort and palliation.  Patient is not able to provide any significant history as she is obtunded and drowsy.  Past Medical History:  Diagnosis Date   Back pain    Cat scratch of right forearm 02/03/2014   Dyspnea    Hard of hearing    Hemorrhoid 04/06/2014   History of kidney stones    Hx of degenerative disc disease    Hypercholesterolemia    Hypertension    Migraines    Osteoarthritis    Stroke (HCC)    mini stroke, resolved in 30 minutes    Past Surgical History:  Procedure Laterality Date   ABDOMINAL HYSTERECTOMY     ANTERIOR AND POSTERIOR REPAIR     APPENDECTOMY     BILATERAL SALPINGOOPHORECTOMY     BREAST SURGERY     CARDIAC CATHETERIZATION     CHOLECYSTECTOMY     colonoscopy  2003   Dr. Riley Cheadle: normal rectum, scattered diverticula   COLONOSCOPY WITH PROPOFOL  N/A 01/01/2016   Procedure: COLONOSCOPY WITH PROPOFOL ;  Surgeon: Suzette Espy, MD;  Location: [facility];  Service: Endoscopy;  Laterality: N/A;  1015   ERCP with sphincterotomy  2003   Dr.  Riley Cheadle: balloon dredging of bile duct, normal ampulla, choledocholithiasis    EUS  2016   Dr. Luster Salters: subtle 13X17 mm heterogenous lesion in pancreas neck, FNA negative for malignancy    LEFT HEART CATH AND CORONARY ANGIOGRAPHY N/A 04/23/2018   Procedure: LEFT HEART CATH AND CORONARY ANGIOGRAPHY;  Surgeon: Arty Binning, MD;  Location: [facility] INVASIVE CV LAB;  Service: Cardiovascular;  Laterality: N/A;   POLYPECTOMY  01/01/2016   Procedure: POLYPECTOMY;  Surgeon: Suzette Espy, MD;  Location: [facility];  Service: Endoscopy;;   polypectomies x2-splenic flexure and cecal   TONSILLECTOMY      Family History  Problem Relation Age of Onset   Heart disease Mother    Cancer Father        liver   Cancer Son    Heart disease Son    Cancer Maternal Grandmother    Colon cancer Neg Hx     Social History:  reports that she has quit smoking. Her smoking use included cigarettes. She has a 50 pack-year smoking history. She has never used smokeless tobacco. She reports that she does not drink alcohol and does not use drugs.  Allergies:  Allergies  Allergen Reactions   Adhesive [Tape] Other (See Comments)    Tears skin   Doxycycline  Nausea And Vomiting   Latex Itching and Rash    Medications:  No current facility-administered  medications on file prior to encounter.   Current Outpatient Medications on File Prior to Encounter  Medication Sig Dispense Refill   acetaminophen  (TYLENOL ) 500 MG tablet Take 2 tablets (1,000 mg total) by mouth every 8 (eight) hours as needed for mild pain, moderate pain or headache.     albuterol  (PROVENTIL  HFA;VENTOLIN  HFA) 108 (90 Base) MCG/ACT inhaler Inhale 2 puffs into the lungs every 6 (six) hours as needed for wheezing or shortness of breath. 1 Inhaler 0   aspirin  81 MG chewable tablet Chew 1 tablet (81 mg total) by mouth daily. 30 tablet 0   atorvastatin  (LIPITOR ) 40 MG tablet Take 40 mg by mouth at bedtime.     clopidogrel  (PLAVIX ) 75 MG tablet Take 1 tablet  (75 mg total) by mouth daily. 30 tablet 0   donepezil  (ARICEPT ) 10 MG tablet Take 10 mg by mouth at bedtime.     fluconazole (DIFLUCAN) 150 MG tablet Take 150 mg by mouth daily as needed (recurrent yeast infections).     fluticasone  (FLONASE ) 50 MCG/ACT nasal spray Place 1 spray into both nostrils daily for 14 days. 16 g 0   HYDROcodone -acetaminophen  (NORCO/VICODIN) 5-325 MG tablet Take 0.5 tablets by mouth every 6 (six) hours as needed for moderate pain (pain score 4-6) or severe pain (pain score 7-10). 10 tablet 0   hydrOXYzine  (VISTARIL ) 50 MG capsule Take 1 capsule by mouth daily.     losartan  (COZAAR ) 25 MG tablet Take 1 tablet (25 mg total) by mouth daily. 30 tablet 3   melatonin 3 MG TABS tablet Take 3 mg by mouth at bedtime.     metoprolol  succinate (TOPROL -XL) 100 MG 24 hr tablet Take 100 mg by mouth daily.     potassium chloride  SA (KLOR-CON  M) 20 MEQ tablet Take 20 mEq by mouth once.     QUEtiapine  (SEROQUEL ) 25 MG tablet Take 12.5 mg by mouth at bedtime.     venlafaxine  (EFFEXOR ) 75 MG tablet Take 75 mg by mouth 2 (two) times daily.       ROS: Not able to obtain  Exam: Blood pressure (!) 146/119, pulse 95, temperature 98.2 F (36.8 C), temperature source Axillary, resp. rate 15, height 5' 2.99" (1.6 m), weight 51.7 kg, SpO2 96%. General:Asleep, not awaking to voice Orientation:Not oriented or aler Mood and Affect: Deferred Gait: Unable to assess Coordination and balance: Unable to assess  Right lower extremity: Leg is shortened and externally rotated.  I did not move her leg to prevent any discomfort.  She has a warm well-perfused foot.  She does not cooperate with motor exam due to her mental status.  She is without deformity to the knee or lower leg.  Medical Decision Making: Data: Imaging: X-rays show a basicervical femoral neck fracture consistent with a high intertrochanteric femur fracture.  Displacement noted.  Labs:  Results for orders placed or performed during  the hospital encounter of 10/25/23 (from the past 24 hours)  Basic metabolic panel     Status: Abnormal   Collection Time: 10/25/23  5:42 PM  Result Value Ref Range   Sodium 137 135 - 145 mmol/L   Potassium 4.9 3.5 - 5.1 mmol/L   Chloride 101 98 - 111 mmol/L   CO2 22 22 - 32 mmol/L   Glucose, Bld 110 (H) 70 - 99 mg/dL   BUN 28 (H) 8 - 23 mg/dL   Creatinine, Ser 5.62 (H) 0.44 - 1.00 mg/dL   Calcium  9.5 8.9 - 10.3 mg/dL   GFR,  Estimated 39 (L) >60 mL/min   Anion gap 14 5 - 15  CBC with Differential     Status: Abnormal   Collection Time: 10/25/23  5:42 PM  Result Value Ref Range   WBC 13.6 (H) 4.0 - 10.5 K/uL   RBC 3.91 3.87 - 5.11 MIL/uL   Hemoglobin 12.4 12.0 - 15.0 g/dL   HCT 09.8 11.9 - 14.7 %   MCV 100.0 80.0 - 100.0 fL   MCH 31.7 26.0 - 34.0 pg   MCHC 31.7 30.0 - 36.0 g/dL   RDW 82.9 56.2 - 13.0 %   Platelets 226 150 - 400 K/uL   nRBC 0.0 0.0 - 0.2 %   Neutrophils Relative % 72 %   Neutro Abs 9.9 (H) 1.7 - 7.7 K/uL   Lymphocytes Relative 13 %   Lymphs Abs 1.7 0.7 - 4.0 K/uL   Monocytes Relative 12 %   Monocytes Absolute 1.7 (H) 0.1 - 1.0 K/uL   Eosinophils Relative 1 %   Eosinophils Absolute 0.1 0.0 - 0.5 K/uL   Basophils Relative 1 %   Basophils Absolute 0.1 0.0 - 0.1 K/uL   Immature Granulocytes 1 %   Abs Immature Granulocytes 0.07 0.00 - 0.07 K/uL  Protime-INR     Status: None   Collection Time: 10/25/23  5:42 PM  Result Value Ref Range   Prothrombin Time 13.7 11.4 - 15.2 seconds   INR 1.0 0.8 - 1.2  Type and screen [facility]     Status: None   Collection Time: 10/25/23  5:42 PM  Result Value Ref Range   ABO/RH(D) O POS    Antibody Screen NEG    Sample Expiration      10/28/2023,2359 Performed at [facility], [address], [place], [place] [address]   CBC     Status: Abnormal   Collection Time: 10/26/23  5:11 AM  Result Value Ref Range   WBC 10.6 (H) 4.0 - 10.5 K/uL   RBC 3.40 (L) 3.87 - 5.11 MIL/uL   Hemoglobin 10.7 (L)  12.0 - 15.0 g/dL   HCT 46.9 (L) 62.9 - 52.8 %   MCV 99.1 80.0 - 100.0 fL   MCH 31.5 26.0 - 34.0 pg   MCHC 31.8 30.0 - 36.0 g/dL   RDW 41.3 24.4 - 01.0 %   Platelets 192 150 - 400 K/uL   nRBC 0.0 0.0 - 0.2 %  Basic metabolic panel     Status: Abnormal   Collection Time: 10/26/23  5:11 AM  Result Value Ref Range   Sodium 138 135 - 145 mmol/L   Potassium 4.5 3.5 - 5.1 mmol/L   Chloride 105 98 - 111 mmol/L   CO2 23 22 - 32 mmol/L   Glucose, Bld 110 (H) 70 - 99 mg/dL   BUN 28 (H) 8 - 23 mg/dL   Creatinine, Ser 2.72 (H) 0.44 - 1.00 mg/dL   Calcium  9.0 8.9 - 10.3 mg/dL   GFR, Estimated 38 (L) >60 mL/min   Anion gap 10 5 - 15  Magnesium      Status: None   Collection Time: 10/26/23  5:11 AM  Result Value Ref Range   Magnesium  2.1 1.7 - 2.4 mg/dL  Phosphorus     Status: None   Collection Time: 10/26/23  5:11 AM  Result Value Ref Range   Phosphorus 4.6 2.5 - 4.6 mg/dL     Imaging or Labs ordered: None  Medical history and chart was reviewed and  case discussed with medical provider.  Assessment/Plan: 84 year old female with advanced dementia with a subdural hematoma with a right intertrochanteric femur fracture.  Due to the unstable nature of her injury and the palliation for fixation I recommend proceeding with cephalomedullary nailing.  Risks and benefits were discussed with the patient's son.  Risks include but not limited to bleeding, infection, malunion, nonunion, hardware failure, hardware irritation, nerve and blood vessel injury, anesthetic complications.  He agreed to proceed with surgery and consent was obtained.  Laneta Pintos, MD [facility] [phone] (office) [email or web address]

## 2023-10-26 NOTE — Anesthesia Procedure Notes (Signed)
 Procedure Name: Intubation Date/Time: 10/26/2023 7:42 AM  Performed by: Bennett Brass, CRNAPre-anesthesia Checklist: Patient identified, Emergency Drugs available, Suction available and Patient being monitored Patient Re-evaluated:Patient Re-evaluated prior to induction Oxygen  Delivery Method: Circle system utilized Preoxygenation: Pre-oxygenation with 100% oxygen  Induction Type: IV induction Ventilation: Mask ventilation without difficulty Laryngoscope Size: Miller and 2 Grade View: Grade I Tube type: Oral Tube size: 7.0 mm Number of attempts: 1 Airway Equipment and Method: Stylet and Oral airway Placement Confirmation: ETT inserted through vocal cords under direct vision, positive ETCO2 and breath sounds checked- equal and bilateral Secured at: 22 cm Tube secured with: Tape Dental Injury: Teeth and Oropharynx as per pre-operative assessment

## 2023-10-26 NOTE — ED Notes (Signed)
 Writer called 5N and notified that patient would be coming upstairs shortly.

## 2023-10-26 NOTE — Op Note (Signed)
 Orthopaedic Surgery Operative Note (CSN: [id number] ) Date of Surgery: 10/26/2023  Admit Date: 10/25/2023   Diagnoses: Pre-Op Diagnoses: Right intertrochanteric femur fracture  Post-Op Diagnosis: Same  Procedures: CPT 27245-Cephalomedullary nailing of right intertrochanteric femur fracture  Surgeons : Primary: Laneta Pintos, MD  Assistant: None  Location: OR 5   Anesthesia: General   Antibiotics: Ancef 2g preop   Tourniquet time: None    Estimated Blood Loss: 100 mL  Complications: None   Specimens:* No specimens in log *   Implants: Implant Name Type Inv. Item Serial No. Manufacturer Lot No. LRB No. Used Action  NAIL INTERTAN 10X18 130D 10S - [id number] Nail NAIL INTERTAN 10X18 130D 10S  [employer] [id number] Right 1 Implanted  SCREW LAG COMPR KIT 100/95 - [id number] Screw SCREW LAG COMPR KIT 100/95  [facility] [employer] [id number] Right 1 Implanted  SCREW TRIGEN LOW PROF 5.0X30 - [id number] Screw SCREW TRIGEN LOW PROF 5.0X30  [employer] [id number] Right 1 Implanted     Indications for Surgery: 83 year old female who sustained a ground-level fall.  She has a history of subdural hematoma and advanced dementia.  She was at a nursing facility.  She was found to have a right intertrochanteric femur fracture.  Due to the unstable nature of her injury and the palliative nature of fixation I recommend proceeding with cephalomedullary nailing.  Risks and benefits were discussed with the patient's son.  Risks included but not limited to bleeding, infection, malunion, nonunion, hardware failure, hardware irritation, nerve and blood vessel injury, anesthetic complications.  He agreed to proceed with surgery and consent was obtained.  Operative Findings: Cephalomedullary nailing of right intertrochanteric femur fracture using [employer] InterTAN 10 x 180 mm nail with a 100 mm lag screw and a 95 mm compression screw.  Procedure: The patient  was identified in the preoperative holding area. Consent was confirmed with the patient and their family and all questions were answered. The operative extremity was marked after confirmation with the patient. she was then brought back to the operating room by our anesthesia colleagues.  She was placed under general anesthetic and carefully transferred over to the [facility] table.  All bony prominences were well-padded.  Traction was applied to the right lower extremity and fluoroscopic imaging showed adequate reduction and alignment.  The right lower extremity was then prepped and draped in usual sterile fashion.  A timeout was performed to verify the patient, the procedure, and the extremity.  Preoperative antibiotics were dosed.  Small incision proximal to the greater trochanter was made and carried down through skin subcutaneous tissue.  A threaded guidewire was placed at the tip of the greater trochanter and advanced into the proximal metaphysis.  I used fluoroscopic imaging to confirm the location of the guidewire.  I then used an entry reamer to enter the medullary canal.  I then passed a 10 x 180 mm nail attached to a targeting arm down the center of the canal.  I then used the targeting arm to direct a threaded guidewire into the head/neck segment.  I confirmed tip apex distance using fluoroscopic imaging.  I then measured the length and decided to use 100 mm lag screw.  I drilled the path for the compression screw and placed an antirotation bar.  I drilled the path for the lag screw and then placed a 100 mm lag screw.  I then placed the compression screw and compressed approximately 5 mm.  I statically locked the  proximal portion of the nail.  Using the targeting arm I then placed a lateral to medial distal interlocking screw.  Final fluoroscopic imaging was obtained.  The incisions were copiously irrigated.  2-0 Monocryl and Dermabond was used to close the skin.  Sterile dressings were applied.  The  patient was then awoke from anesthesia and taken to the PACU in stable condition.  Post Op Plan/Instructions: The patient be weightbearing as tolerated to the right lower extremity.  She will receive postoperative Ancef.  She will receive aspirin  for DVT prophylaxis.  Will have her mobilize with physical and Occupational Therapy.  I was present and performed the entire surgery.  Katheryne Pane, MD [facility]

## 2023-10-26 NOTE — Interval H&P Note (Signed)
 History and Physical Interval Note:  10/26/2023 7:26 AM  Connie Wood  has presented today for surgery, with the diagnosis of Right intertrochanteric femur fracture.  The various methods of treatment have been discussed with the patient and family. After consideration of risks, benefits and other options for treatment, the patient has consented to  Procedure(s): FIXATION, FRACTURE, INTERTROCHANTERIC, WITH INTRAMEDULLARY ROD (Right) as a surgical intervention.  The patient's history has been reviewed, patient examined, no change in status, stable for surgery.  I have reviewed the patient's chart and labs.  Questions were answered to the patient's satisfaction.     Hardeep Reetz P Mathias Bogacki

## 2023-10-26 NOTE — H&P (View-Only) (Signed)
 Orthopaedic Trauma Service (OTS) Consult   Patient ID: Connie Wood MRN: [id number] DOB/AGE: 83-Aug-1942 83 y.o.  Reason for Consult:Right intertrochanteric femur fracture Referring Physician: Dr. Almond Army, MD [facility]  HPI: Connie Wood is an 83 y.o. female who is being seen in consultation at the request of Dr. Leida Puna for evaluation of right hip fracture.  The patient had ground-level fall at her nursing facility.  She was recently discharged from the hospital on hospice due to a subdural hematoma and advanced dementia.  She had a shortened and externally rotated leg and was brought to the emergency room where x-rays showed the hip fracture.  I was consulted.  The patient has had a declining status over the last few months and was recently admitted as noted above.  Discussions were had with the patient's family specifically her son and they wish to proceed with surgical intervention for comfort and palliation.  Patient is not able to provide any significant history as she is obtunded and drowsy.  Past Medical History:  Diagnosis Date   Back pain    Cat scratch of right forearm 02/03/2014   Dyspnea    Hard of hearing    Hemorrhoid 04/06/2014   History of kidney stones    Hx of degenerative disc disease    Hypercholesterolemia    Hypertension    Migraines    Osteoarthritis    Stroke (HCC)    mini stroke, resolved in 30 minutes    Past Surgical History:  Procedure Laterality Date   ABDOMINAL HYSTERECTOMY     ANTERIOR AND POSTERIOR REPAIR     APPENDECTOMY     BILATERAL SALPINGOOPHORECTOMY     BREAST SURGERY     CARDIAC CATHETERIZATION     CHOLECYSTECTOMY     colonoscopy  2003   Dr. Riley Cheadle: normal rectum, scattered diverticula   COLONOSCOPY WITH PROPOFOL  N/A 01/01/2016   Procedure: COLONOSCOPY WITH PROPOFOL ;  Surgeon: Suzette Espy, MD;  Location: [facility];  Service: Endoscopy;  Laterality: N/A;  1015   ERCP with sphincterotomy  2003   Dr.  Riley Cheadle: balloon dredging of bile duct, normal ampulla, choledocholithiasis    EUS  2016   Dr. Luster Salters: subtle 13X17 mm heterogenous lesion in pancreas neck, FNA negative for malignancy    LEFT HEART CATH AND CORONARY ANGIOGRAPHY N/A 04/23/2018   Procedure: LEFT HEART CATH AND CORONARY ANGIOGRAPHY;  Surgeon: Arty Binning, MD;  Location: [facility];  Service: Cardiovascular;  Laterality: N/A;   POLYPECTOMY  01/01/2016   Procedure: POLYPECTOMY;  Surgeon: Suzette Espy, MD;  Location: [facility];  Service: Endoscopy;;   polypectomies x2-splenic flexure and cecal   TONSILLECTOMY      Family History  Problem Relation Age of Onset   Heart disease Mother    Cancer Father        liver   Cancer Son    Heart disease Son    Cancer Maternal Grandmother    Colon cancer Neg Hx     Social History:  reports that she has quit smoking. Her smoking use included cigarettes. She has a 50 pack-year smoking history. She has never used smokeless tobacco. She reports that she does not drink alcohol and does not use drugs.  Allergies:  Allergies  Allergen Reactions   Adhesive [Tape] Other (See Comments)    Tears skin   Doxycycline  Nausea And Vomiting   Latex Itching and Rash    Medications:  No current facility-administered  medications on file prior to encounter.   Current Outpatient Medications on File Prior to Encounter  Medication Sig Dispense Refill   acetaminophen  (TYLENOL ) 500 MG tablet Take 2 tablets (1,000 mg total) by mouth every 8 (eight) hours as needed for mild pain, moderate pain or headache.     albuterol  (PROVENTIL  HFA;VENTOLIN  HFA) 108 (90 Base) MCG/ACT inhaler Inhale 2 puffs into the lungs every 6 (six) hours as needed for wheezing or shortness of breath. 1 Inhaler 0   aspirin  81 MG chewable tablet Chew 1 tablet (81 mg total) by mouth daily. 30 tablet 0   atorvastatin  (LIPITOR ) 40 MG tablet Take 40 mg by mouth at bedtime.     clopidogrel  (PLAVIX ) 75 MG tablet Take 1 tablet  (75 mg total) by mouth daily. 30 tablet 0   donepezil  (ARICEPT ) 10 MG tablet Take 10 mg by mouth at bedtime.     fluconazole (DIFLUCAN) 150 MG tablet Take 150 mg by mouth daily as needed (recurrent yeast infections).     fluticasone  (FLONASE ) 50 MCG/ACT nasal spray Place 1 spray into both nostrils daily for 14 days. 16 g 0   HYDROcodone -acetaminophen  (NORCO/VICODIN) 5-325 MG tablet Take 0.5 tablets by mouth every 6 (six) hours as needed for moderate pain (pain score 4-6) or severe pain (pain score 7-10). 10 tablet 0   hydrOXYzine  (VISTARIL ) 50 MG capsule Take 1 capsule by mouth daily.     losartan  (COZAAR ) 25 MG tablet Take 1 tablet (25 mg total) by mouth daily. 30 tablet 3   melatonin 3 MG TABS tablet Take 3 mg by mouth at bedtime.     metoprolol  succinate (TOPROL -XL) 100 MG 24 hr tablet Take 100 mg by mouth daily.     potassium chloride  SA (KLOR-CON  M) 20 MEQ tablet Take 20 mEq by mouth once.     QUEtiapine  (SEROQUEL ) 25 MG tablet Take 12.5 mg by mouth at bedtime.     venlafaxine  (EFFEXOR ) 75 MG tablet Take 75 mg by mouth 2 (two) times daily.       ROS: Not able to obtain  Exam: Blood pressure (!) 146/119, pulse 95, temperature 98.2 F (36.8 C), temperature source Axillary, resp. rate 15, height 5' 2.99" (1.6 m), weight 51.7 kg, SpO2 96%. General:Asleep, not awaking to voice Orientation:Not oriented or aler Mood and Affect: Deferred Gait: Unable to assess Coordination and balance: Unable to assess  Right lower extremity: Leg is shortened and externally rotated.  I did not move her leg to prevent any discomfort.  She has a warm well-perfused foot.  She does not cooperate with motor exam due to her mental status.  She is without deformity to the knee or lower leg.  Medical Decision Making: Data: Imaging: X-rays show a basicervical femoral neck fracture consistent with a high intertrochanteric femur fracture.  Displacement noted.  Labs:  Results for orders placed or performed during  the hospital encounter of 10/25/23 (from the past 24 hours)  Basic metabolic panel     Status: Abnormal   Collection Time: 10/25/23  5:42 PM  Result Value Ref Range   Sodium 137 135 - 145 mmol/L   Potassium 4.9 3.5 - 5.1 mmol/L   Chloride 101 98 - 111 mmol/L   CO2 22 22 - 32 mmol/L   Glucose, Bld 110 (H) 70 - 99 mg/dL   BUN 28 (H) 8 - 23 mg/dL   Creatinine, Ser 5.62 (H) 0.44 - 1.00 mg/dL   Calcium  9.5 8.9 - 10.3 mg/dL   GFR,  Estimated 39 (L) >60 mL/min   Anion gap 14 5 - 15  CBC with Differential     Status: Abnormal   Collection Time: 10/25/23  5:42 PM  Result Value Ref Range   WBC 13.6 (H) 4.0 - 10.5 K/uL   RBC 3.91 3.87 - 5.11 MIL/uL   Hemoglobin 12.4 12.0 - 15.0 g/dL   HCT 09.8 11.9 - 14.7 %   MCV 100.0 80.0 - 100.0 fL   MCH 31.7 26.0 - 34.0 pg   MCHC 31.7 30.0 - 36.0 g/dL   RDW 82.9 56.2 - 13.0 %   Platelets 226 150 - 400 K/uL   nRBC 0.0 0.0 - 0.2 %   Neutrophils Relative % 72 %   Neutro Abs 9.9 (H) 1.7 - 7.7 K/uL   Lymphocytes Relative 13 %   Lymphs Abs 1.7 0.7 - 4.0 K/uL   Monocytes Relative 12 %   Monocytes Absolute 1.7 (H) 0.1 - 1.0 K/uL   Eosinophils Relative 1 %   Eosinophils Absolute 0.1 0.0 - 0.5 K/uL   Basophils Relative 1 %   Basophils Absolute 0.1 0.0 - 0.1 K/uL   Immature Granulocytes 1 %   Abs Immature Granulocytes 0.07 0.00 - 0.07 K/uL  Protime-INR     Status: None   Collection Time: 10/25/23  5:42 PM  Result Value Ref Range   Prothrombin Time 13.7 11.4 - 15.2 seconds   INR 1.0 0.8 - 1.2  Type and screen [facility]     Status: None   Collection Time: 10/25/23  5:42 PM  Result Value Ref Range   ABO/RH(D) O POS    Antibody Screen NEG    Sample Expiration      10/28/2023,2359 Performed at [facility], [address], [place], [place] [address]   CBC     Status: Abnormal   Collection Time: 10/26/23  5:11 AM  Result Value Ref Range   WBC 10.6 (H) 4.0 - 10.5 K/uL   RBC 3.40 (L) 3.87 - 5.11 MIL/uL   Hemoglobin 10.7 (L)  12.0 - 15.0 g/dL   HCT 46.9 (L) 62.9 - 52.8 %   MCV 99.1 80.0 - 100.0 fL   MCH 31.5 26.0 - 34.0 pg   MCHC 31.8 30.0 - 36.0 g/dL   RDW 41.3 24.4 - 01.0 %   Platelets 192 150 - 400 K/uL   nRBC 0.0 0.0 - 0.2 %  Basic metabolic panel     Status: Abnormal   Collection Time: 10/26/23  5:11 AM  Result Value Ref Range   Sodium 138 135 - 145 mmol/L   Potassium 4.5 3.5 - 5.1 mmol/L   Chloride 105 98 - 111 mmol/L   CO2 23 22 - 32 mmol/L   Glucose, Bld 110 (H) 70 - 99 mg/dL   BUN 28 (H) 8 - 23 mg/dL   Creatinine, Ser 2.72 (H) 0.44 - 1.00 mg/dL   Calcium  9.0 8.9 - 10.3 mg/dL   GFR, Estimated 38 (L) >60 mL/min   Anion gap 10 5 - 15  Magnesium      Status: None   Collection Time: 10/26/23  5:11 AM  Result Value Ref Range   Magnesium  2.1 1.7 - 2.4 mg/dL  Phosphorus     Status: None   Collection Time: 10/26/23  5:11 AM  Result Value Ref Range   Phosphorus 4.6 2.5 - 4.6 mg/dL     Imaging or Labs ordered: None  Medical history and chart was reviewed and  case discussed with medical provider.  Assessment/Plan: 84 year old female with advanced dementia with a subdural hematoma with a right intertrochanteric femur fracture.  Due to the unstable nature of her injury and the palliation for fixation I recommend proceeding with cephalomedullary nailing.  Risks and benefits were discussed with the patient's son.  Risks include but not limited to bleeding, infection, malunion, nonunion, hardware failure, hardware irritation, nerve and blood vessel injury, anesthetic complications.  He agreed to proceed with surgery and consent was obtained.  Laneta Pintos, MD [facility] [phone] (office) [email or web address]

## 2023-10-26 NOTE — Progress Notes (Signed)
 [facility] [phone] [facility] Liaison Note  Previous Hospice Referral with [facility] from recent discharge from [facility]. Patient was rehospitalization prior to hospice admission.   Will continue to follow for disposition.   Jacqlyn Matas, BSN, RN Hospice Nurse Liaison [phone]

## 2023-10-26 NOTE — Progress Notes (Signed)
 PROGRESS NOTE  Connie Wood ZOX:[id number] DOB: 03/20/1941   PCP: Nguyen, Hong Thu T, NP  Patient is from: Nursing home/[facility].  Per son, ambulates with rollator.  DOA: 10/25/2023 LOS: 1  Chief complaints Chief Complaint  Patient presents with   Fall   Hip Pain     Brief Narrative / Interim history: 83 year old F with PMH of severe dementia, CVA, subdural hematoma, hearing loss, CKD-3B, HTN and HLD brought to ED by EMS from nursing home after found down on ground likely due to unwitnessed fall, and found to have right proximal femoral fracture.  Patient was hospitalized at [facility] from 4/29-5/2 due to progressive subdural hematoma and discharged to nursing home with hospice. CT head with stable bilateral subdural hematoma.  Orthopedic surgery consulted.   Patient underwent right cephalomedullary nailing by Dr. Curtiss Dowdy on 5/4.   Subjective: Seen and examined earlier this morning after she returned from surgery.  She is sleepy but wakes to voice.  Does not interact or follow commands.  Does not appear to be in distress.  Objective: Vitals:   10/26/23 0900 10/26/23 0910 10/26/23 0931 10/26/23 1208  BP: (!) 171/86 (!) 161/88 139/79 124/62  Pulse: 83 84 87 91  Resp: 17 16 16 17   Temp:  97.8 F (36.6 C) 98.6 F (37 C) 98.4 F (36.9 C)  TempSrc:   Axillary Axillary  SpO2: 97% 96% 97% 97%  Weight:      Height:        Examination:  GENERAL: No apparent distress.  Nontoxic. HEENT: MMM.  Vision and hearing grossly intact.  NECK: Supple.  No apparent JVD.  RESP:  No IWOB.  Fair aeration bilaterally. CVS:  RRR. Heart sounds normal.  ABD/GI/GU: BS+. Abd soft, NTND.  MSK/EXT:  Moves extremities. No apparent deformity. No edema.  SKIN: no apparent skin lesion or wound.  Surgery call dressing DCI. NEURO: Sleepy but wakes to voice.  Does not interact or follow commands.  No apparent focal neuro deficit. PSYCH: Calm. Normal affect.   Consultants:  Orthopedic  surgery  Procedures: 5/4-right cephalomedullary nailing for right proximal femoral fracture  Microbiology summarized: None  Assessment and plan: Right femoral fracture secondary to unwitnessed fall at nursing home, POA. -S/p right cephalomedullary nailing by orthopedic surgery -Pain control per surgery.  Will schedule Tylenol  to spare opiates -SCD for VTE prophylaxis.  Will avoid pharmacologic VTE prophylaxis given SDH -PT/OT  Severe dementia without behavioral disturbance: Sleepy but wakes to voice.  Does not interact or follow commands. -Reorientation delirium precaution -Minimize avoid sedating medications  Progressive subdural hematoma with brain compression: Hospitalized for this from 4/29-5/2 and discharged to nursing home with hospice.  Unclear if she is still taking the Plavix  and aspirin  or not.   CT head with stable SDH and 5 mm rightward midline shift -Avoid anticoagulation or antiplatelets  CKD-3B: Relatively stable. Recent Labs    09/08/23 2215 09/09/23 0754 09/10/23 0511 09/11/23 0511 09/12/23 0506 10/21/23 0604 10/22/23 0352 10/23/23 0424 10/24/23 0516 10/25/23 1742 10/26/23 0511  BUN 26*  --  19 23 24* 25* 19 30* 26* 28* 28*  CREATININE 0.94 0.87 1.00 1.00 1.06* 1.01* 0.95 1.50* 1.11* 1.36* 1.38*  -Continue gentle IV fluid - Discontinue losartan   Essential hypertension: Normotensive -Discontinue losartan  -Pain control  Advance care planning-recently hospitalized and discharged to nursing home with hospice.  She is DNR -Likely discharge back to nursing home with hospice   Leukocytosis: Likely demargination -Continue monitoring  Body mass index  is 20.2 kg/m.          DVT prophylaxis:  Place and maintain sequential compression device Start: 10/26/23 1324 SCDs Start: 10/26/23 0931 SCDs Start: 10/26/23 0102  Code Status: DNR-Limited Family Communication: Updated patient's son, Rodman Clam over the phone Level of care: Telemetry Surgical Status is:  Inpatient Remains inpatient appropriate because: Right hip fracture   Final disposition: SNF   55 minutes with more than 50% spent in reviewing records, counseling patient/family and coordinating care.   Sch Meds:  Scheduled Meds:  docusate sodium  100 mg Oral BID   Continuous Infusions:  sodium chloride  50 mL/hr at 10/26/23 0956    ceFAZolin (ANCEF) IV     tranexamic acid     PRN Meds:.acetaminophen , HYDROcodone -acetaminophen , labetalol , melatonin, methocarbamol **OR** methocarbamol (ROBAXIN) injection, morphine  injection, polyethylene glycol, prochlorperazine, tranexamic acid  Antimicrobials: Anti-infectives (From admission, onward)    Start     Dose/Rate Route Frequency Ordered Stop   10/26/23 1800  ceFAZolin (ANCEF) IVPB 2g/100 mL premix        2 g 200 mL/hr over 30 Minutes Intravenous Every 8 hours 10/26/23 0930 10/27/23 2159   10/26/23 0700  ceFAZolin (ANCEF) 2-4 GM/100ML-% IVPB       Note to Pharmacy: Chaneta Comer: cabinet override      10/26/23 0700 10/26/23 0951        I have personally reviewed the following labs and images: CBC: Recent Labs  Lab 10/21/23 0604 10/22/23 0352 10/23/23 0424 10/24/23 0516 10/25/23 1742 10/26/23 0511  WBC 9.1 9.9 8.5 10.0 13.6* 10.6*  NEUTROABS 6.5  --   --   --  9.9*  --   HGB 11.2* 10.2* 10.7* 11.1* 12.4 10.7*  HCT 35.6* 32.6* 33.5* 36.1 39.1 33.7*  MCV 101.4* 98.8 100.3* 100.6* 100.0 99.1  PLT 232 225 233 234 226 192   BMP &GFR Recent Labs  Lab 10/22/23 0352 10/23/23 0424 10/24/23 0516 10/25/23 1742 10/26/23 0511  NA 138 135 140 137 138  K 3.4* 4.4 4.4 4.9 4.5  CL 103 101 104 101 105  CO2 25 26 27 22 23   GLUCOSE 114* 99 94 110* 110*  BUN 19 30* 26* 28* 28*  CREATININE 0.95 1.50* 1.11* 1.36* 1.38*  CALCIUM  9.1 9.1 9.2 9.5 9.0  MG  --  2.1 2.1  --  2.1  PHOS  --   --   --   --  4.6   Estimated Creatinine Clearance: 25.7 mL/min (A) (by C-G formula based on SCr of 1.38 mg/dL (H)). Liver & Pancreas: No  results for input(s): "AST", "ALT", "ALKPHOS", "BILITOT", "PROT", "ALBUMIN" in the last 168 hours. No results for input(s): "LIPASE", "AMYLASE" in the last 168 hours. No results for input(s): "AMMONIA" in the last 168 hours. Diabetic: No results for input(s): "HGBA1C" in the last 72 hours. No results for input(s): "GLUCAP" in the last 168 hours. Cardiac Enzymes: No results for input(s): "CKTOTAL", "CKMB", "CKMBINDEX", "TROPONINI" in the last 168 hours. No results for input(s): "PROBNP" in the last 8760 hours. Coagulation Profile: Recent Labs  Lab 10/25/23 1742  INR 1.0   Thyroid  Function Tests: No results for input(s): "TSH", "T4TOTAL", "FREET4", "T3FREE", "THYROIDAB" in the last 72 hours. Lipid Profile: No results for input(s): "CHOL", "HDL", "LDLCALC", "TRIG", "CHOLHDL", "LDLDIRECT" in the last 72 hours. Anemia Panel: No results for input(s): "VITAMINB12", "FOLATE", "FERRITIN", "TIBC", "IRON", "RETICCTPCT" in the last 72 hours. Urine analysis:    Component Value Date/Time   COLORURINE STRAW (A) 10/21/2023 1610  APPEARANCEUR CLEAR 10/21/2023 0604   LABSPEC 1.010 10/21/2023 0604   PHURINE 7.0 10/21/2023 0604   GLUCOSEU NEGATIVE 10/21/2023 0604   HGBUR NEGATIVE 10/21/2023 0604   BILIRUBINUR NEGATIVE 10/21/2023 0604   KETONESUR NEGATIVE 10/21/2023 0604   PROTEINUR 100 (A) 10/21/2023 0604   UROBILINOGEN 0.2 11/30/2014 1221   NITRITE NEGATIVE 10/21/2023 0604   LEUKOCYTESUR NEGATIVE 10/21/2023 0604   Sepsis Labs: Invalid input(s): "PROCALCITONIN", "LACTICIDVEN"  Microbiology: Recent Results (from the past 240 hours)  Resp panel by RT-PCR (RSV, Flu A&B, Covid) Anterior Nasal Swab     Status: None   Collection Time: 10/21/23  6:19 AM   Specimen: Anterior Nasal Swab  Result Value Ref Range Status   SARS Coronavirus 2 by RT PCR NEGATIVE NEGATIVE Final    Comment: (NOTE) SARS-CoV-2 target nucleic acids are NOT DETECTED.  The SARS-CoV-2 RNA is generally detectable in upper  respiratory specimens during the acute phase of infection. The lowest concentration of SARS-CoV-2 viral copies this assay can detect is 138 copies/mL. A negative result does not preclude SARS-Cov-2 infection and should not be used as the sole basis for treatment or other patient management decisions. A negative result may occur with  improper specimen collection/handling, submission of specimen other than nasopharyngeal swab, presence of viral mutation(s) within the areas targeted by this assay, and inadequate number of viral copies(<138 copies/mL). A negative result must be combined with clinical observations, patient history, and epidemiological information. The expected result is Negative.  Fact Sheet for Patients:  BloggerCourse.com  Fact Sheet for Healthcare Providers:  SeriousBroker.it  This test is no t yet approved or cleared by the [place]  FDA and  has been authorized for detection and/or diagnosis of SARS-CoV-2 by FDA under an Emergency Use Authorization (EUA). This EUA will remain  in effect (meaning this test can be used) for the duration of the COVID-19 declaration under Section 564(b)(1) of the Act, 21 U.S.C.section 360bbb-3(b)(1), unless the authorization is terminated  or revoked sooner.       Influenza A by PCR NEGATIVE NEGATIVE Final   Influenza B by PCR NEGATIVE NEGATIVE Final    Comment: (NOTE) The Xpert Xpress SARS-CoV-2/FLU/RSV plus assay is intended as an aid in the diagnosis of influenza from Nasopharyngeal swab specimens and should not be used as a sole basis for treatment. Nasal washings and aspirates are unacceptable for Xpert Xpress SARS-CoV-2/FLU/RSV testing.  Fact Sheet for Patients: BloggerCourse.com  Fact Sheet for Healthcare Providers: SeriousBroker.it  This test is not yet approved or cleared by the [place]  FDA and has been  authorized for detection and/or diagnosis of SARS-CoV-2 by FDA under an Emergency Use Authorization (EUA). This EUA will remain in effect (meaning this test can be used) for the duration of the COVID-19 declaration under Section 564(b)(1) of the Act, 21 U.S.C. section 360bbb-3(b)(1), unless the authorization is terminated or revoked.     Resp Syncytial Virus by PCR NEGATIVE NEGATIVE Final    Comment: (NOTE) Fact Sheet for Patients: BloggerCourse.com  Fact Sheet for Healthcare Providers: SeriousBroker.it  This test is not yet approved or cleared by the [place]  FDA and has been authorized for detection and/or diagnosis of SARS-CoV-2 by FDA under an Emergency Use Authorization (EUA). This EUA will remain in effect (meaning this test can be used) for the duration of the COVID-19 declaration under Section 564(b)(1) of the Act, 21 U.S.C. section 360bbb-3(b)(1), unless the authorization is terminated or revoked.  Performed at [facility], [address], [place], [place] [address]  MRSA Next Gen by PCR, Nasal     Status: None   Collection Time: 10/21/23  2:15 PM   Specimen: Nasal Mucosa; Nasal Swab  Result Value Ref Range Status   MRSA by PCR Next Gen NOT DETECTED NOT DETECTED Final    Comment: (NOTE) The GeneXpert MRSA Assay (FDA approved for NASAL specimens only), is one component of a comprehensive MRSA colonization surveillance program. It is not intended to diagnose MRSA infection nor to guide or monitor treatment for MRSA infections. Test performance is not FDA approved in patients less than 13 years old. Performed at [facility], [address], [place], [place] [address]     Radiology Studies: DG HIP PORT Luis Sailors OR W/O PELVIS 1V RIGHT Result Date: 10/26/2023 CLINICAL DATA:  96051 Fracture 96051 EXAM: DG HIP (WITH OR WITHOUT PELVIS) 1V PORT RIGHT COMPARISON:  10/25/2023 FINDINGS: Interval sliding screw and IM rod  fixation across right intertrochanteric femur fracture, with a single distal interlocking screw. Normal alignment. No dislocation. Scattered femoral arterial calcifications. IMPRESSION: Interval ORIF right intertrochanteric femur fracture. Electronically Signed   By: Nicoletta Barrier M.D.   On: 10/26/2023 11:48   DG HIP UNILAT WITH PELVIS 2-3 VIEWS RIGHT Result Date: 10/26/2023 CLINICAL DATA:  Fluoro guidance provided EXAM: DG HIP (WITH OR WITHOUT PELVIS) 2-3V RIGHT FINDINGS: Dose: Cumulative Air Kerma 4.2mG y Fluoro time 42s IMPRESSION: C-arm fluoro guidance provided during right hip ORIF. Electronically Signed   By: Sydell Eva M.D.   On: 10/26/2023 09:36   DG C-Arm 1-60 Min-No Report Result Date: 10/26/2023 Fluoroscopy was utilized by the requesting physician.  No radiographic interpretation.   CT Head Wo Contrast Result Date: 10/25/2023 CLINICAL DATA:  Status post fall with head trauma EXAM: CT HEAD WITHOUT CONTRAST TECHNIQUE: Contiguous axial images were obtained from the base of the skull through the vertex without intravenous contrast. RADIATION DOSE REDUCTION: This exam was performed according to the departmental dose-optimization program which includes automated exposure control, adjustment of the mA and/or kV according to patient size and/or use of iterative reconstruction technique. COMPARISON:  CT head 10/21/2023 FINDINGS: Brain: No substantial change from 10/21/2023. Left-sided mixed density subdural hematoma with septations on the left. This measures up to 2.2 cm in thickness, unchanged using similar measuring technique. Small low-density right-sided subdural hematoma is also unchanged measuring up to 6 mm in radial dimension. 5 mm of rightward midline shift, unchanged. No new hemorrhage. No evidence of acute infarct. Stable ventricular caliber. Vascular: No hyperdense vessel or unexpected calcification. Skull: No acute fracture. Sinuses/Orbits: Chronic opacification of a few mastoid air cells  bilaterally. Mucosal thickening in the right maxillary sinus. Paranasal sinuses are otherwise well aerated. Other: None. IMPRESSION: 1. No substantial change from 10/21/2023. 2. Left-sided mixed density subdural hematoma with septations measuring up to 2.2 cm in thickness. 3. Small low-density right-sided subdural hematoma measuring up to 6 mm in radial dimension. 4. 5 mm of rightward midline shift. Electronically Signed   By: Rozell Cornet M.D.   On: 10/25/2023 21:16   DG Hip Unilat With Pelvis 2-3 Views Right Result Date: 10/25/2023 CLINICAL DATA:  Trauma and hip shortening EXAM: DG HIP (WITH OR WITHOUT PELVIS) 2-3V RIGHT COMPARISON:  CT of 07/26/2023 FINDINGS: Vascular calcifications. Femoral heads are located. Osteopenia. Prior sacral plasty. Lower lumbar spondylosis. Artifact degraded attempted lateral views. Mildly comminuted right femoral neck fracture with varus angulation and impaction. Likely at the level of the base of the femoral neck versus intertrochanteric region. IMPRESSION: Impacted, varus angulated right  proximal femur fracture. Electronically Signed   By: Lore Rode M.D.   On: 10/25/2023 18:15   DG Chest Port 1 View Result Date: 10/25/2023 CLINICAL DATA:  Shortness of breath.  Trauma. EXAM: PORTABLE CHEST 1 VIEW COMPARISON:  Chest CT dated 09/09/2023. FINDINGS: No focal consolidation, pleural effusion or pneumothorax. The cardiac silhouette is within normal limits. Atherosclerotic calcification of the aortic arch. No acute osseous pathology. IMPRESSION: No active disease. Electronically Signed   By: Angus Bark M.D.   On: 10/25/2023 18:13      Mikyla Schachter T. Airica Schwartzkopf [facility]  If 7PM-7AM, please contact night-coverage [email or web address] 10/26/2023, 1:26 PM

## 2023-10-26 NOTE — Anesthesia Preprocedure Evaluation (Addendum)
 Anesthesia Evaluation  Patient identified by MRN, date of birth, ID band Patient awake    Reviewed: Allergy & Precautions, H&P , NPO status , Patient's Chart, lab work & pertinent test results  Airway Mallampati: II   Neck ROM: full    Dental   Pulmonary former smoker   breath sounds clear to auscultation       Cardiovascular hypertension, + CAD  + Valvular Problems/Murmurs AS  Rhythm:regular Rate:Normal  TTE (08/2023): EF 70%, moderate AS with mean PG 21 mmHg   Neuro/Psych  Headaches PSYCHIATRIC DISORDERS Anxiety Depression   Dementia CVA    GI/Hepatic ,GERD  ,,  Endo/Other    Renal/GU Renal InsufficiencyRenal diseaseStones  Cr 1.38     Musculoskeletal  (+) Arthritis ,    Abdominal   Peds  Hematology   Anesthesia Other Findings   Reproductive/Obstetrics                             Anesthesia Physical Anesthesia Plan  ASA: 3  Anesthesia Plan: General   Post-op Pain Management:    Induction: Intravenous  PONV Risk Score and Plan: 3 and Ondansetron , Dexamethasone and Treatment may vary due to age or medical condition  Airway Management Planned: Oral ETT  Additional Equipment:   Intra-op Plan:   Post-operative Plan: Extubation in OR  Informed Consent: I have reviewed the patients History and Physical, chart, labs and discussed the procedure including the risks, benefits and alternatives for the proposed anesthesia with the patient or authorized representative who has indicated his/her understanding and acceptance.     Dental advisory given  Plan Discussed with: CRNA, Anesthesiologist and Surgeon  Anesthesia Plan Comments:        Anesthesia Quick Evaluation

## 2023-10-26 NOTE — H&P (Addendum)
 History and Physical  Connie Wood RUE:[id number] DOB: 08/07/1940 DOA: 10/25/2023  Referring physician: Dr. Leida Puna, EDP  PCP: [name], NP  Outpatient Specialists: Hospice care. Patient coming from: [facility] hospice care  Chief Complaint: Fall.  HPI: Connie Wood is a 83 y.o. female with medical history significant for advanced dementia, hearing loss, hypertension, hyperlipidemia, CKD 3B, history of CVA and subdural hematoma who was recently admitted on 10/21/2023 for progressive subdural hematoma and discharged on 10/24/2023 to hospice care, who presents to the ER via EMS after being found on the ground at the facility after an unwitnessed fall.  Shortening and rotation was noted to the right leg.  The patient was brought to the ER for further evaluation.  In the ER, the patient is somnolent and minimally interactive.  Right hip x-ray revealed impacted, varus angulated right proximal femur fracture.  EDP discussed the case with orthopedic surgery Dr. Curtiss Dowdy.  Recommend n p.o. after midnight.  Plan for orthopedic surgical intervention in the morning.  ED Course: Temperature 100.2.  BP 160/90, pulse 95, respiration rate 24, O2 saturation 100% on 2 L [place].  Review of Systems: Review of systems as noted in the HPI. All other systems reviewed and are negative.   Past Medical History:  Diagnosis Date   Back pain    Cat scratch of right forearm 02/03/2014   Dyspnea    Hard of hearing    Hemorrhoid 04/06/2014   History of kidney stones    Hx of degenerative disc disease    Hypercholesterolemia    Hypertension    Migraines    Osteoarthritis    Stroke (HCC)    mini stroke, resolved in 30 minutes   Past Surgical History:  Procedure Laterality Date   ABDOMINAL HYSTERECTOMY     ANTERIOR AND POSTERIOR REPAIR     APPENDECTOMY     BILATERAL SALPINGOOPHORECTOMY     BREAST SURGERY     CARDIAC CATHETERIZATION     CHOLECYSTECTOMY     colonoscopy  2003   Dr. Riley Cheadle:  normal rectum, scattered diverticula   COLONOSCOPY WITH PROPOFOL  N/A 01/01/2016   Procedure: COLONOSCOPY WITH PROPOFOL ;  Surgeon: Suzette Espy, MD;  Location: [facility];  Service: Endoscopy;  Laterality: N/A;  1015   ERCP with sphincterotomy  2003   Dr. Riley Cheadle: balloon dredging of bile duct, normal ampulla, choledocholithiasis    EUS  2016   Dr. Luster Salters: subtle 13X17 mm heterogenous lesion in pancreas neck, FNA negative for malignancy    LEFT HEART CATH AND CORONARY ANGIOGRAPHY N/A 04/23/2018   Procedure: LEFT HEART CATH AND CORONARY ANGIOGRAPHY;  Surgeon: Arty Binning, MD;  Location: [facility];  Service: Cardiovascular;  Laterality: N/A;   POLYPECTOMY  01/01/2016   Procedure: POLYPECTOMY;  Surgeon: Suzette Espy, MD;  Location: [facility];  Service: Endoscopy;;   polypectomies x2-splenic flexure and cecal   TONSILLECTOMY      Social History:  reports that she has quit smoking. Her smoking use included cigarettes. She has a 50 pack-year smoking history. She has never used smokeless tobacco. She reports that she does not drink alcohol and does not use drugs.   Allergies  Allergen Reactions   Adhesive [Tape] Other (See Comments)    Tears skin   Doxycycline  Nausea And Vomiting   Latex Itching and Rash    Family History  Problem Relation Age of Onset   Heart disease Mother    Cancer Father  liver   Cancer Son    Heart disease Son    Cancer Maternal Grandmother    Colon cancer Neg Hx       Prior to Admission medications   Medication Sig Start Date End Date Taking? Authorizing Provider  acetaminophen  (TYLENOL ) 500 MG tablet Take 2 tablets (1,000 mg total) by mouth every 8 (eight) hours as needed for mild pain, moderate pain or headache. 04/25/18  Yes Hongalgi, Thomasene Flemings, MD  albuterol  (PROVENTIL  HFA;VENTOLIN  HFA) 108 (90 Base) MCG/ACT inhaler Inhale 2 puffs into the lungs every 6 (six) hours as needed for wheezing or shortness of breath. 04/25/18  Yes Hongalgi,  Anand D, MD  aspirin  81 MG chewable tablet Chew 1 tablet (81 mg total) by mouth daily. 04/25/18  Yes Hongalgi, Anand D, MD  atorvastatin  (LIPITOR ) 40 MG tablet Take 40 mg by mouth at bedtime.   Yes [provider]  clopidogrel  (PLAVIX ) 75 MG tablet Take 1 tablet (75 mg total) by mouth daily. 04/26/18  Yes Hongalgi, Anand D, MD  donepezil  (ARICEPT ) 10 MG tablet Take 10 mg by mouth at bedtime. 08/14/23  Yes [provider]  fluconazole (DIFLUCAN) 150 MG tablet Take 150 mg by mouth daily as needed (recurrent yeast infections).   Yes [provider]  fluticasone  (FLONASE ) 50 MCG/ACT nasal spray Place 1 spray into both nostrils daily for 14 days. 03/05/20 05/23/24 Yes Avegno, Komlanvi S, FNP  HYDROcodone -acetaminophen  (NORCO/VICODIN) 5-325 MG tablet Take 0.5 tablets by mouth every 6 (six) hours as needed for moderate pain (pain score 4-6) or severe pain (pain score 7-10). 10/24/23  Yes Shah, Pratik D, DO  hydrOXYzine  (VISTARIL ) 50 MG capsule Take 1 capsule by mouth daily.   Yes [provider]  losartan  (COZAAR ) 25 MG tablet Take 1 tablet (25 mg total) by mouth daily. 09/12/23 09/11/24 Yes Justina Oman, MD  melatonin 3 MG TABS tablet Take 3 mg by mouth at bedtime.   Yes [provider]  metoprolol  succinate (TOPROL -XL) 100 MG 24 hr tablet Take 100 mg by mouth daily. 08/14/23  Yes [provider]  potassium chloride  SA (KLOR-CON  M) 20 MEQ tablet Take 20 mEq by mouth once.   Yes [provider]  QUEtiapine  (SEROQUEL ) 25 MG tablet Take 12.5 mg by mouth at bedtime. 08/14/23  Yes [provider]  venlafaxine  (EFFEXOR ) 75 MG tablet Take 75 mg by mouth 2 (two) times daily. 08/14/23  Yes [provider]    Physical Exam: BP (!) 162/90   Pulse 98   Temp 99.6 F (37.6 C) (Oral)   Resp (!) 27   SpO2 94%   General: 83 y.o. year-old female frail-appearing in no acute distress.  Somnolent.  Cardiovascular: Regular rate and rhythm with no  rubs or gallops.  No thyromegaly or JVD noted.  No lower extremity edema bilaterally. Respiratory: Clear to auscultation with no wheezes or rales.  Poor inspiratory effort. Abdomen: Soft nontender nondistended with normal bowel sounds x4 quadrants. Muskuloskeletal: No cyanosis, clubbing or edema noted bilaterally Neuro: CN II-XII intact, strength, sensation, reflexes Skin: No ulcerative lesions noted or rashes Psychiatry: Judgement and insight appear altered. Mood is appropriate for condition and setting          Labs on Admission:  Basic Metabolic Panel: Recent Labs  Lab 10/21/23 0604 10/22/23 0352 10/23/23 0424 10/24/23 0516 10/25/23 1742  NA 139 138 135 140 137  K 4.4 3.4* 4.4 4.4 4.9  CL 102 103 101 104 101  CO2 28 25  26 27 22   GLUCOSE 111* 114* 99 94 110*  BUN 25* 19 30* 26* 28*  CREATININE 1.01* 0.95 1.50* 1.11* 1.36*  CALCIUM  9.4 9.1 9.1 9.2 9.5  MG  --   --  2.1 2.1  --    Liver Function Tests: No results for input(s): "AST", "ALT", "ALKPHOS", "BILITOT", "PROT", "ALBUMIN" in the last 168 hours. No results for input(s): "LIPASE", "AMYLASE" in the last 168 hours. No results for input(s): "AMMONIA" in the last 168 hours. CBC: Recent Labs  Lab 10/21/23 0604 10/22/23 0352 10/23/23 0424 10/24/23 0516 10/25/23 1742  WBC 9.1 9.9 8.5 10.0 13.6*  NEUTROABS 6.5  --   --   --  9.9*  HGB 11.2* 10.2* 10.7* 11.1* 12.4  HCT 35.6* 32.6* 33.5* 36.1 39.1  MCV 101.4* 98.8 100.3* 100.6* 100.0  PLT 232 225 233 234 226   Cardiac Enzymes: No results for input(s): "CKTOTAL", "CKMB", "CKMBINDEX", "TROPONINI" in the last 168 hours.  BNP (last 3 results) Recent Labs    09/09/23 0754 10/21/23 0604  BNP 1,240.0* 1,442.0*    ProBNP (last 3 results) No results for input(s): "PROBNP" in the last 8760 hours.  CBG: No results for input(s): "GLUCAP" in the last 168 hours.  Radiological Exams on Admission: CT Head Wo Contrast Result Date: 10/25/2023 CLINICAL DATA:  Status post  fall with head trauma EXAM: CT HEAD WITHOUT CONTRAST TECHNIQUE: Contiguous axial images were obtained from the base of the skull through the vertex without intravenous contrast. RADIATION DOSE REDUCTION: This exam was performed according to the departmental dose-optimization program which includes automated exposure control, adjustment of the mA and/or kV according to patient size and/or use of iterative reconstruction technique. COMPARISON:  CT head 10/21/2023 FINDINGS: Brain: No substantial change from 10/21/2023. Left-sided mixed density subdural hematoma with septations on the left. This measures up to 2.2 cm in thickness, unchanged using similar measuring technique. Small low-density right-sided subdural hematoma is also unchanged measuring up to 6 mm in radial dimension. 5 mm of rightward midline shift, unchanged. No new hemorrhage. No evidence of acute infarct. Stable ventricular caliber. Vascular: No hyperdense vessel or unexpected calcification. Skull: No acute fracture. Sinuses/Orbits: Chronic opacification of a few mastoid air cells bilaterally. Mucosal thickening in the right maxillary sinus. Paranasal sinuses are otherwise well aerated. Other: None. IMPRESSION: 1. No substantial change from 10/21/2023. 2. Left-sided mixed density subdural hematoma with septations measuring up to 2.2 cm in thickness. 3. Small low-density right-sided subdural hematoma measuring up to 6 mm in radial dimension. 4. 5 mm of rightward midline shift. Electronically Signed   By: Rozell Cornet M.D.   On: 10/25/2023 21:16   DG Hip Unilat With Pelvis 2-3 Views Right Result Date: 10/25/2023 CLINICAL DATA:  Trauma and hip shortening EXAM: DG HIP (WITH OR WITHOUT PELVIS) 2-3V RIGHT COMPARISON:  CT of 07/26/2023 FINDINGS: Vascular calcifications. Femoral heads are located. Osteopenia. Prior sacral plasty. Lower lumbar spondylosis. Artifact degraded attempted lateral views. Mildly comminuted right femoral neck fracture with varus  angulation and impaction. Likely at the level of the base of the femoral neck versus intertrochanteric region. IMPRESSION: Impacted, varus angulated right proximal femur fracture. Electronically Signed   By: Lore Rode M.D.   On: 10/25/2023 18:15   DG Chest Port 1 View Result Date: 10/25/2023 CLINICAL DATA:  Shortness of breath.  Trauma. EXAM: PORTABLE CHEST 1 VIEW COMPARISON:  Chest CT dated 09/09/2023. FINDINGS: No focal consolidation, pleural effusion or pneumothorax. The cardiac silhouette is within normal limits. Atherosclerotic calcification  of the aortic arch. No acute osseous pathology. IMPRESSION: No active disease. Electronically Signed   By: Angus Bark M.D.   On: 10/25/2023 18:13    EKG: I independently viewed the EKG done and my findings are as followed: Sinus tachycardia rate of 117.  Nonspecific ST changes.  C4 50.  Assessment/Plan Present on Admission:  Right femoral fracture (HCC)  Principal Problem:   Right femoral fracture (HCC)  Right femoral fracture secondary to unwitnessed fall, POA. N.p.o. until seen by orthopedic surgery Gentle IV fluid hydration LR at 50 cc/h x 6 hours. Pain control Bed rest  Leukocytosis Possibly reactive No active disease on chest x-ray. Repeat CBC If no improvement of WBC, obtain UA  Hypertension, not at goal, elevated IV labetalol  as needed with parameters Continue to closely monitor vital signs.  Advanced dementia Reorient as needed  CKD 3B At baseline. Monitor urine output Gentle IV fluid hydration LR 50 cc/h x 6 hours.  Goals of care Recently discharged to hospice care DNR/DNI.  History of progressive intracranial bleed Goal SBP normotensive.   Time: 75 minutes.    DVT prophylaxis: SCDs.  Code Status: DNR form at bedside.  Family Communication: None at bedside.  Disposition Plan: Admitted to telemetry surgical unit.  Consults called: Orthopedic surgery.  Admission status: Inpatient status.   Status  is: Inpatient The patient requires at least 2 midnights for further evaluation and treatment of present condition.   Bary Boss MD [facility] Pager [phone]  If 7PM-7AM, please contact night-coverage [email or web address] Password [email or web address]  10/26/2023, 1:02 AM

## 2023-10-27 DIAGNOSIS — D62 Acute posthemorrhagic anemia: Secondary | ICD-10-CM

## 2023-10-27 DIAGNOSIS — S7291XA Unspecified fracture of right femur, initial encounter for closed fracture: Secondary | ICD-10-CM | POA: Diagnosis not present

## 2023-10-27 DIAGNOSIS — N1832 Chronic kidney disease, stage 3b: Secondary | ICD-10-CM | POA: Insufficient documentation

## 2023-10-27 LAB — CBC
HCT: 28.7 % — ABNORMAL LOW (ref 36.0–46.0)
Hemoglobin: 8.8 g/dL — ABNORMAL LOW (ref 12.0–15.0)
MCH: 31.4 pg (ref 26.0–34.0)
MCHC: 30.7 g/dL (ref 30.0–36.0)
MCV: 102.5 fL — ABNORMAL HIGH (ref 80.0–100.0)
Platelets: 201 10*3/uL (ref 150–400)
RBC: 2.8 MIL/uL — ABNORMAL LOW (ref 3.87–5.11)
RDW: 13.3 % (ref 11.5–15.5)
WBC: 11.4 10*3/uL — ABNORMAL HIGH (ref 4.0–10.5)
nRBC: 0 % (ref 0.0–0.2)

## 2023-10-27 LAB — RENAL FUNCTION PANEL
Albumin: 2.9 g/dL — ABNORMAL LOW (ref 3.5–5.0)
Anion gap: 14 (ref 5–15)
BUN: 41 mg/dL — ABNORMAL HIGH (ref 8–23)
CO2: 20 mmol/L — ABNORMAL LOW (ref 22–32)
Calcium: 8.6 mg/dL — ABNORMAL LOW (ref 8.9–10.3)
Chloride: 107 mmol/L (ref 98–111)
Creatinine, Ser: 1.51 mg/dL — ABNORMAL HIGH (ref 0.44–1.00)
GFR, Estimated: 34 mL/min — ABNORMAL LOW (ref >60)
Glucose, Bld: 107 mg/dL — ABNORMAL HIGH (ref 70–99)
Phosphorus: 6.5 mg/dL — ABNORMAL HIGH (ref 2.5–4.6)
Potassium: 4.5 mmol/L (ref 3.5–5.1)
Sodium: 141 mmol/L (ref 135–145)

## 2023-10-27 LAB — MAGNESIUM: Magnesium: 2.4 mg/dL (ref 1.7–2.4)

## 2023-10-27 MED ORDER — LACTATED RINGERS IV SOLN: INTRAVENOUS | Status: AC

## 2023-10-27 MED ORDER — HYDROCODONE-ACETAMINOPHEN 5-325 MG PO TABS: 0.5000 | ORAL_TABLET | ORAL | Status: DC | PRN

## 2023-10-27 MED ORDER — HYDROCODONE-ACETAMINOPHEN 5-325 MG PO TABS: 0.5000 | ORAL_TABLET | Freq: Four times a day (QID) | ORAL | 0 refills | Status: AC | PRN

## 2023-10-27 NOTE — Discharge Instructions (Signed)
 Orthopaedic Trauma Service Discharge Instructions   General Discharge Instructions  WEIGHT BEARING STATUS: Weightbearing as tolerated  RANGE OF MOTION/ACTIVITY: Unrestricted range of motion of the hip and knee  Wound Care: You may remove your surgical dressing on postoperative day #2 (10/28/2023). Incisions can be left open to air if there is no drainage. Once the incision is completely dry and without drainage, it may be left open to air out.  Showering may begin postoperative day #3 (10/29/2023).  Clean incision gently with soap and water.  DVT/PE prophylaxis: Aspirin  81 mg daily  Diet: as you were eating previously.  Can use over the counter stool softeners and bowel preparations, such as Miralax, to help with bowel movements.  Narcotics can be constipating.  Be sure to drink plenty of fluids  PAIN MEDICATION USE AND EXPECTATIONS  You have likely been given narcotic medications to help control your pain.  After a traumatic event that results in an fracture (broken bone) with or without surgery, it is ok to use narcotic pain medications to help control one's pain.  We understand that everyone responds to pain differently and each individual patient will be evaluated on a regular basis for the continued need for narcotic medications. Ideally, narcotic medication use should last no more than 6-8 weeks (coinciding with fracture healing).   As a patient it is your responsibility as well to monitor narcotic medication use and report the amount and frequency you use these medications when you come to your office visit.   We would also advise that if you are using narcotic medications, you should take a dose prior to therapy to maximize you participation.  IF YOU ARE ON NARCOTIC MEDICATIONS IT IS NOT PERMISSIBLE TO OPERATE A MOTOR VEHICLE (MOTORCYCLE/CAR/TRUCK/MOPED) OR HEAVY MACHINERY DO NOT MIX NARCOTICS WITH OTHER CNS (CENTRAL NERVOUS SYSTEM) DEPRESSANTS SUCH AS ALCOHOL  POST-OPERATIVE OPIOID  TAPER INSTRUCTIONS: It is important to wean off of your opioid medication as soon as possible. If you do not need pain medication after your surgery it is ok to stop day one. Opioids include: Codeine, Hydrocodone (Norco, Vicodin), Oxycodone (Percocet, oxycontin ) and hydromorphone amongst others.  Long term and even short term use of opiods can cause: Increased pain response Dependence Constipation Depression Respiratory depression And more.  Withdrawal symptoms can include Flu like symptoms Nausea, vomiting And more Techniques to manage these symptoms Hydrate well Eat regular healthy meals Stay active Use relaxation techniques(deep breathing, meditating, yoga) Do Not substitute Alcohol to help with tapering If you have been on opioids for less than two weeks and do not have pain than it is ok to stop all together.  Plan to wean off of opioids This plan should start within one week post op of your fracture surgery  Maintain the same interval or time between taking each dose and first decrease the dose.  Cut the total daily intake of opioids by one tablet each day Next start to increase the time between doses. The last dose that should be eliminated is the evening dose.    STOP SMOKING OR USING NICOTINE PRODUCTS!!!!  As discussed nicotine severely impairs your body's ability to heal surgical and traumatic wounds but also impairs bone healing.  Wounds and bone heal by forming microscopic blood vessels (angiogenesis) and nicotine is a vasoconstrictor (essentially, shrinks blood vessels).  Therefore, if vasoconstriction occurs to these microscopic blood vessels they essentially disappear and are unable to deliver necessary nutrients to the healing tissue.  This is one modifiable factor that you  can do to dramatically increase your chances of healing your injury.  (This means no smoking, no nicotine gum, patches, etc)  DO NOT USE NONSTEROIDAL ANTI-INFLAMMATORY DRUGS (NSAID'S)  Using  products such as Advil (ibuprofen), Aleve (naproxen), Motrin (ibuprofen) for additional pain control during fracture healing can delay and/or prevent the healing response.  If you would like to take over the counter (OTC) medication, Tylenol  (acetaminophen ) is ok.  However, some narcotic medications that are given for pain control contain acetaminophen  as well. Therefore, you should not exceed more than 4000 mg of tylenol  in a day if you do not have liver disease.  Also note that there are may OTC medicines, such as cold medicines and allergy medicines that my contain tylenol  as well.  If you have any questions about medications and/or interactions please ask your doctor/PA or your pharmacist.      ICE AND ELEVATE INJURED/OPERATIVE EXTREMITY  Using ice and elevating the injured extremity above your heart can help with swelling and pain control.  Icing in a pulsatile fashion, such as 20 minutes on and 20 minutes off, can be followed.    Do not place ice directly on skin. Make sure there is a barrier between to skin and the ice pack.    Using frozen items such as frozen peas works well as the conform nicely to the are that needs to be iced.  USE AN ACE WRAP OR TED HOSE FOR SWELLING CONTROL  In addition to icing and elevation, Ace wraps or TED hose are used to help limit and resolve swelling.  It is recommended to use Ace wraps or TED hose until you are informed to stop.    When using Ace Wraps start the wrapping distally (farthest away from the body) and wrap proximally (closer to the body)   Example: If you had surgery on your leg or thing and you do not have a splint on, start the ace wrap at the toes and work your way up to the thigh        If you had surgery on your upper extremity and do not have a splint on, start the ace wrap at your fingers and work your way up to the upper arm    CALL THE OFFICE FOR MEDICATION REFILLS OR WITH ANY QUESTIONS/CONCERNS: [phone]   VISIT OUR WEBSITE FOR  ADDITIONAL INFORMATION: [email or web address]  Discharge Wound Care Instructions  Do NOT apply any ointments, solutions or lotions to pin sites or surgical wounds.  These prevent needed drainage and even though solutions like hydrogen peroxide kill bacteria, they also damage cells lining the pin sites that help fight infection.  Applying lotions or ointments can keep the wounds moist and can cause them to breakdown and open up as well. This can increase the risk for infection. When in doubt call the office.  Surgical incisions should be dressed daily.  If any drainage is noted, use foam dressing (Mepilex)  Once the incision is completely dry and without drainage, it may be left open to air out.  Showering may begin 36-48 hours later.  Cleaning gently with soap and water.    Call office for the following: Temperature greater than 101F Persistent nausea and vomiting Severe uncontrolled pain Redness, tenderness, or signs of infection (pain, swelling, redness, odor or green/yellow discharge around the site) Difficulty breathing, headache or visual disturbances Hives Persistent dizziness or light-headedness Extreme fatigue Any other questions or concerns you may have after discharge  In an emergency, call 911  or go to an Emergency Department at a nearby hospital  OTHER HELPFUL INFORMATION  If you had a block, it will wear off between 8-24 hrs postop typically.  This is period when your pain may go from nearly zero to the pain you would have had postop without the block.  This is an abrupt transition but nothing dangerous is happening.  You may take an extra dose of narcotic when this happens.  You should wean off your narcotic medicines as soon as you are able.  Most patients will be off or using minimal narcotics before their first postop appointment.   We suggest you use the pain medication the first night prior to going to bed, in order to ease any pain when the anesthesia wears off. You  should avoid taking pain medications on an empty stomach as it will make you nauseous.  Do not drink alcoholic beverages or take illicit drugs when taking pain medications.  In most states it is against the law to drive while you are in a splint or sling.  And certainly against the law to drive while taking narcotics.  You may return to work/school in the next couple of days when you feel up to it.   Pain medication may make you constipated.  Below are a few solutions to try in this order: Decrease the amount of pain medication if you aren't having pain. Drink lots of decaffeinated fluids. Drink prune juice and/or each dried prunes  If the first 3 don't work start with additional solutions Take Colace - an over-the-counter stool softener Take Senokot - an over-the-counter laxative Take Miralax - a stronger over-the-counter laxative

## 2023-10-27 NOTE — Evaluation (Signed)
 Physical Therapy Evaluation Patient Details Name: Connie Wood MRN: [id number] DOB: [birth year] Today's Date: 10/27/2023  History of Present Illness  Connie Wood is a 83 y.o. female who presents to the ER via EMS after being found on the ground at the facility after an unwitnessed fall; with medical history significant for advanced dementia, hearing loss, hypertension, hyperlipidemia, CKD 3B, history of CVA and subdural hematoma who was recently admitted on 10/21/2023 for progressive subdural hematoma and discharged on 10/24/2023 to hospice care,  Clinical Impression   Pt admitted with above diagnosis. Comes form [facility], where pt had gone with Hospice services after another recent hospitalization; Prior to admission, pt was able to walk with RW and assist; Presents to PT with a functional decline compared to her normal daily function, weakness, pain limiting use of RLE for upright activities, walking;  Still, she participated well this session, and is WBAT RLE; Possible with adequate pain control for her to make solid progress with functional mobility; Pt currently with functional limitations due to the deficits listed below (see PT Problem List). Pt will benefit from skilled PT to increase their independence and safety with mobility to allow discharge to the venue listed below.            If plan is discharge home, recommend the following: A lot of help with bathing/dressing/bathroom;A lot of help with walking and/or transfers;Help with stairs or ramp for entrance;Assistance with cooking/housework   Can travel by private vehicle   No    Equipment Recommendations Rolling walker (2 wheels);BSC/3in1  Recommendations for Other Services       Functional Status Assessment Patient has had a recent decline in their functional status and demonstrates the ability to make significant improvements in function in a reasonable and predictable amount of time.     Precautions /  Restrictions Precautions Precautions: Fall Restrictions RLE Weight Bearing Per Provider Order: Weight bearing as tolerated      Mobility  Bed Mobility Overal bed mobility: Needs Assistance Bed Mobility: Supine to Sit     Supine to sit: Max assist     General bed mobility comments: Max multimodal cues, use of bed pad to shift hips and squaer off sitting at EOB    Transfers Overall transfer level: Needs assistance Equipment used: 2 person hand held assist Transfers: Bed to chair/wheelchair/BSC   Stand pivot transfers: +2 physical assistance, Max assist         General transfer comment: Heavily dependent on Bil UE and trunk support; pivotted towards her less painful, L side    Ambulation/Gait                  Stairs            Wheelchair Mobility     Tilt Bed    Modified Rankin (Stroke Patients Only)       Balance     Sitting balance-Leahy Scale: Fair Sitting balance - Comments: seated at EOB   Standing balance support: During functional activity, No upper extremity supported Standing balance-Leahy Scale: Poor                               Pertinent Vitals/Pain Pain Assessment Pain Assessment: Faces Faces Pain Scale: Hurts little more Pain Location: R hip; occasional grimace with movement Pain Descriptors / Indicators: Aching, Grimacing Pain Intervention(s): Monitored during session    Home Living Family/patient expects to be discharged to::  Skilled nursing facility                   Additional Comments: From [employer];    Prior Function Prior Level of Function : Needs assist             Mobility Comments: Patient is poor historian; Notable recent admission where it looks like pt dc'd to [facility] with hospice       Extremity/Trunk Assessment   Upper Extremity Assessment Upper Extremity Assessment: Defer to OT evaluation    Lower Extremity Assessment Lower Extremity Assessment: Generalized  weakness;RLE deficits/detail RLE Deficits / Details: Grossly decr AROM and strength, limited by pain post injury and surgical fixation       Communication   Communication Communication: No apparent difficulties    Cognition Arousal: Alert Behavior During Therapy: WFL for tasks assessed/performed   PT - Cognitive impairments: History of cognitive impairments                       PT - Cognition Comments: Oriented to self only; Smiles easily and talkative Following commands: Intact       Cueing Cueing Techniques: Tactile cues, Verbal cues     General Comments      Exercises     Assessment/Plan    PT Assessment Patient needs continued PT services  PT Problem List Decreased strength;Decreased range of motion;Decreased activity tolerance;Decreased balance;Decreased mobility;Decreased cognition;Decreased knowledge of use of DME;Decreased safety awareness;Decreased knowledge of precautions;Pain       PT Treatment Interventions DME instruction;Gait training;Stair training;Functional mobility training;Therapeutic activities;Therapeutic exercise;Balance training;Patient/family education    PT Goals (Current goals can be found in the Care Plan section)  Acute Rehab PT Goals Patient Stated Goal: Agreeable to get OOB and eat breakfast PT Goal Formulation: With patient Time For Goal Achievement: 11/10/23 Potential to Achieve Goals: Good    Frequency Min 3X/week     Co-evaluation               AM-PAC PT "6 Clicks" Mobility  Outcome Measure Help needed turning from your back to your side while in a flat bed without using bedrails?: A Lot Help needed moving from lying on your back to sitting on the side of a flat bed without using bedrails?: A Lot Help needed moving to and from a bed to a chair (including a wheelchair)?: A Lot Help needed standing up from a chair using your arms (e.g., wheelchair or bedside chair)?: A Lot Help needed to walk in hospital room?:  Total Help needed climbing 3-5 steps with a railing? : Total 6 Click Score: 10    End of Session Equipment Utilized During Treatment: Gait belt Activity Tolerance: Patient tolerated treatment well Patient left: in chair;with call bell/phone within reach;with chair alarm set Nurse Communication: Mobility status PT Visit Diagnosis: Unsteadiness on feet (R26.81);Other abnormalities of gait and mobility (R26.89);Muscle weakness (generalized) (M62.81);Pain Pain - Right/Left: Right Pain - part of body: Hip    Time: 0937-1000 PT Time Calculation (min) (ACUTE ONLY): 23 min   Charges:   PT Evaluation $PT Eval Moderate Complexity: 1 Mod   PT General Charges $$ ACUTE PT VISIT: 1 Visit         Darcus Eastern, PT  Acute Rehabilitation Services Office [phone] Secure Chat welcomed   Marcial Setting 10/27/2023, 11:47 AM

## 2023-10-27 NOTE — Progress Notes (Addendum)
 Orthopaedic Trauma Progress Note  SUBJECTIVE: Doing okay this morning.  Patient with advanced dementia, not able to contribute much to exam today.  Sitting comfortably in bed.    OBJECTIVE:  Vitals:   10/27/23 0554 10/27/23 0800  BP: 128/76 (!) 146/76  Pulse:  96  Resp: 17 20  Temp: 98.4 F (36.9 C) 97.8 F (36.6 C)  SpO2: 95% 99%    Opiates Today (MME): Today's  total administered Morphine  Milligram Equivalents: 0 Opiates Yesterday (MME): Yesterday's total administered Morphine  Milligram Equivalents: 22.5  General: Sitting up in bed, pleasantly demented.  No acute distress Respiratory: No increased work of breathing.  Operative Extremity (RLE): Dressings clean, dry, intact.  No obvious signs of discomfort with palpation over the hip or throughout the thigh.  No obvious calf tenderness.  Tolerates gentle passive ankle motion. + DP pulse  IMAGING: Stable post op imaging.   LABS:  Results for orders placed or performed during the hospital encounter of 10/25/23 (from the past 24 hours)  VITAMIN D 25 Hydroxy (Vit-D Deficiency, Fractures)     Status: None   Collection Time: 10/26/23 10:29 AM  Result Value Ref Range   Vit D, 25-Hydroxy 38.59 30 - 100 ng/mL  CBC     Status: Abnormal   Collection Time: 10/27/23  7:03 AM  Result Value Ref Range   WBC 11.4 (H) 4.0 - 10.5 K/uL   RBC 2.80 (L) 3.87 - 5.11 MIL/uL   Hemoglobin 8.8 (L) 12.0 - 15.0 g/dL   HCT 96.0 (L) 45.4 - 09.8 %   MCV 102.5 (H) 80.0 - 100.0 fL   MCH 31.4 26.0 - 34.0 pg   MCHC 30.7 30.0 - 36.0 g/dL   RDW 11.9 14.7 - 82.9 %   Platelets 201 150 - 400 K/uL   nRBC 0.0 0.0 - 0.2 %    ASSESSMENT: Connie Wood is a 83 y.o. female, 1 Day Post-Op s/p ground-level fall at SNF Procedures: INTRAMEDULLARY NAILING RIGHT INTERTROCHANTERIC FEMUR FRACTURE  CV/Blood loss: Acute blood loss anemia, Hgb 8.8 this AM. Hemodynamically stable  PLAN: Weightbearing: WBAT RLE ROM: Unrestricted ROM  Incisional and dressing care:  Reinforce dressings as needed  Showering: Okay to be getting incisions wet 10/29/2023 Orthopedic device(s): None  Pain management: Continue multimodal pain control. VTE prophylaxis: Aspirin  81 mg. Hold restarting Plavix  until Hgb stabilizes. SCDs ID:  Ancef 2gm post op Foley/Lines:  No foley, KVO IVFs Impediments to Fracture Healing: Vitamin D level looks good at 38, no additional supplementation needed. Dispo: PT/OT evaluation today, discharge back to SNF when cleared by medicine team.    D/C recommendations: - Norco, Tylenol  for pain control - Continue home dose ASA and Plavix  for DVT prophylaxis - No additional need for Vit D supplementation  Follow - up plan: 2 weeks after d/c for wound check and repeat x-rays   Contact information:  Katheryne Pane MD, Alona Jamaica PA-C. After hours and holidays please check [email or web address] for group call information for [facility]   Edilia Gordon, PA-C [phone] (office) [email or web address]

## 2023-10-27 NOTE — Evaluation (Signed)
 Occupational Therapy Evaluation Patient Details Name: Connie Wood MRN: [id number] DOB: 1941-01-08 Today's Date: 10/27/2023   History of Present Illness   Connie Wood is a 83 y.o. female who presents to the ER via EMS after being found on the ground at the facility after an unwitnessed fall; with medical history significant for advanced dementia, hearing loss, hypertension, hyperlipidemia, CKD 3B, history of CVA and subdural hematoma who was recently admitted on 10/21/2023 for progressive subdural hematoma and discharged on 10/24/2023 to hospice care,     Clinical Impressions PTA, per chart, pt from [facility]F with hospice services active. Upon eval, pt pleasantly confused, so unable to provide PLOF. Pt with functional decline as compared to most recently documented baseline as pt previously walking with RW. Pt with good participation, limited by cognition and R hip pain. Pt needing grossly max A +2 for functional mobility and mod A for BADL seated secondary to decreased ability to sequence; unsure if this is her baseline, but history of advanced dementia. Will continue to follow. Patient will benefit from continued inpatient follow up therapy, <3 hours/day      If plan is discharge home, recommend the following:   Two people to help with walking and/or transfers;Two people to help with bathing/dressing/bathroom;Assistance with cooking/housework;Direct supervision/assist for medications management;Direct supervision/assist for financial management;Assist for transportation;Help with stairs or ramp for entrance;Assistance with feeding     Functional Status Assessment   Patient has had a recent decline in their functional status and/or demonstrates limited ability to make significant improvements in function in a reasonable and predictable amount of time     Equipment Recommendations   Other (comment) (defer)     Recommendations for Other Services          Precautions/Restrictions   Precautions Precautions: Fall Restrictions Weight Bearing Restrictions Per Provider Order: No RLE Weight Bearing Per Provider Order: Weight bearing as tolerated     Mobility Bed Mobility Overal bed mobility: Needs Assistance Bed Mobility: Supine to Sit     Supine to sit: Max assist     General bed mobility comments: Max multimodal cues, use of bed pad to shift hips and squaer off sitting at EOB    Transfers Overall transfer level: Needs assistance Equipment used: 2 person hand held assist Transfers: Bed to chair/wheelchair/BSC   Stand pivot transfers: +2 physical assistance, Max assist         General transfer comment: Heavily dependent on Bil UE and trunk support; pivotted towards her less painful, L side      Balance Overall balance assessment: Needs assistance   Sitting balance-Leahy Scale: Fair Sitting balance - Comments: seated at EOB   Standing balance support: During functional activity, No upper extremity supported Standing balance-Leahy Scale: Poor                             ADL either performed or assessed with clinical judgement   ADL Overall ADL's : Needs assistance/impaired Eating/Feeding: Moderate assistance;Cueing for sequencing (inn chair with back support) Eating/Feeding Details (indicate cue type and reason): asked NT to come help pt finish breakfast Grooming: Wash/dry face;Set up;Sitting   Upper Body Bathing: Moderate assistance;Sitting   Lower Body Bathing: Total assistance;Sitting/lateral leans;+2 for physical assistance;+2 for safety/equipment   Upper Body Dressing : Sitting;Moderate assistance;Cueing for sequencing   Lower Body Dressing: Total assistance;+2 for physical assistance;+2 for safety/equipment;Sit to/from stand   Toilet Transfer: Maximal assistance;+2 for physical assistance;+2  for safety/equipment;Stand-pivot           Functional mobility during ADLs: Maximal assistance;+2  for physical assistance;+2 for safety/equipment       Vision   Additional Comments: suspect decr peripheral vision     Perception         Praxis         Pertinent Vitals/Pain Pain Assessment Pain Assessment: Faces Faces Pain Scale: Hurts little more Pain Location: R hip; occasional grimace with movement Pain Descriptors / Indicators: Aching, Grimacing Pain Intervention(s): Monitored during session, Limited activity within patient's tolerance     Extremity/Trunk Assessment Upper Extremity Assessment Upper Extremity Assessment: Generalized weakness   Lower Extremity Assessment Lower Extremity Assessment: Defer to PT evaluation RLE Deficits / Details: Grossly decr AROM and strength, limited by pain post injury and surgical fixation   Cervical / Trunk Assessment Cervical / Trunk Assessment: Kyphotic   Communication Communication Communication: No apparent difficulties   Cognition Arousal: Alert Behavior During Therapy: WFL for tasks assessed/performed Cognition: History of cognitive impairments             OT - Cognition Comments: per chart, advanced dementia, tangential in conversation and difficulty following commands ~70% of time, but pleasant                 Following commands: Intact       Cueing  General Comments   Cueing Techniques: Tactile cues;Verbal cues  VSS   Exercises     Shoulder Instructions      Home Living Family/patient expects to be discharged to:: Skilled nursing facility                                 Additional Comments: From [facility];      Prior Functioning/Environment Prior Level of Function : Needs assist             Mobility Comments: Patient is poor historian; Notable recent admission where it looks like pt dc'd to [facility] SNF with hospice      OT Problem List: Decreased strength;Decreased activity tolerance;Impaired balance (sitting and/or standing);Decreased cognition;Decreased  safety awareness;Decreased knowledge of use of DME or AE;Decreased knowledge of precautions;Pain   OT Treatment/Interventions: Self-care/ADL training;Therapeutic exercise;DME and/or AE instruction;Therapeutic activities;Patient/family education;Balance training      OT Goals(Current goals can be found in the care plan section)   Acute Rehab OT Goals Patient Stated Goal: unable OT Goal Formulation: Patient unable to participate in goal setting Time For Goal Achievement: 11/10/23 Potential to Achieve Goals: Fair   OT Frequency:  Min 1X/week    Co-evaluation              AM-PAC OT "6 Clicks" Daily Activity     Outcome Measure Help from another person eating meals?: A Lot Help from another person taking care of personal grooming?: A Lot Help from another person toileting, which includes using toliet, bedpan, or urinal?: Total Help from another person bathing (including washing, rinsing, drying)?: A Lot Help from another person to put on and taking off regular upper body clothing?: A Lot Help from another person to put on and taking off regular lower body clothing?: Total 6 Click Score: 10   End of Session Equipment Utilized During Treatment: Gait belt Nurse Communication: Mobility status (potential feeder secondary to poor attention/ability to sustain task)  Activity Tolerance: Patient tolerated treatment well Patient left: in chair;with call bell/phone within reach;with chair alarm  set  OT Visit Diagnosis: Unsteadiness on feet (R26.81);Muscle weakness (generalized) (M62.81);History of falling (Z91.81);Other symptoms and signs involving cognitive function;Pain Pain - Right/Left: Right Pain - part of body: Hip                Time: 1308-6578 OT Time Calculation (min): 24 min Charges:  OT General Charges $OT Visit: 1 Visit OT Evaluation $OT Eval Moderate Complexity: 1 Mod  Karilyn Ouch, OTR/L [facility] Acute Rehabilitation Office: [phone]   Emery Hans 10/27/2023, 12:11 PM

## 2023-10-27 NOTE — Plan of Care (Signed)
   Problem: Elimination: Goal: Will not experience complications related to bowel motility Outcome: Progressing   Problem: Pain Managment: Goal: General experience of comfort will improve and/or be controlled Outcome: Progressing   Problem: Safety: Goal: Ability to remain free from injury will improve Outcome: Progressing

## 2023-10-27 NOTE — Progress Notes (Signed)
 PROGRESS NOTE  Connie Wood GEX:[id number] DOB: [birth year]   PCP: Nguyen, Hong Thu T, NP  Patient is from: Nursing home/[facility].  Per son, ambulates with rollator.  DOA: 10/25/2023 LOS: 2  Chief complaints Chief Complaint  Patient presents with   Fall   Hip Pain     Brief Narrative / Interim history: 83 year old F with PMH of severe dementia, CVA, subdural hematoma, hearing loss, CKD-3B, HTN and HLD brought to ED by EMS from nursing home after found down on ground likely due to unwitnessed fall, and found to have right proximal femoral fracture.  Patient was hospitalized at [facility] from 4/29-5/2 due to progressive subdural hematoma and discharged to nursing home with hospice. CT head with stable bilateral subdural hematoma.  Orthopedic surgery consulted.   Patient underwent right cephalomedullary nailing by Dr. Curtiss Dowdy on 5/4.   Subjective: Seen and examined earlier this morning.  No major events overnight of this morning.  Patient was sleeping quietly and wakes to gentle tactile stimuli.  She is disoriented.  No distress.  Objective: Vitals:   10/26/23 2009 10/27/23 0554 10/27/23 0800 10/27/23 1431  BP: 136/82 128/76 (!) 146/76 102/60  Pulse: 94  96 (!) 101  Resp: 19 17 20 19   Temp: 98.4 F (36.9 C) 98.4 F (36.9 C) 97.8 F (36.6 C) 98.4 F (36.9 C)  TempSrc: Oral Oral Oral Oral  SpO2: 95% 95% 99% 90%  Weight:      Height:        Examination:  GENERAL: No apparent distress.  Nontoxic. HEENT: MMM.  Vision grossly intact.  Significantly diminished hearing NECK: Supple.  No apparent JVD.  RESP:  No IWOB.  Fair aeration bilaterally. CVS:  RRR. Heart sounds normal.  ABD/GI/GU: BS+. Abd soft, NTND.  MSK/EXT:  Moves extremities. No apparent deformity. No edema.  SKIN: no apparent skin lesion or wound.  Surgery call dressing DCI. NEURO: Sleepy but awakes to gentle tactile stimuli.  Disoriented.  No apparent focal neuro deficit. PSYCH: Calm. Normal  affect.   Consultants:  Orthopedic surgery  Procedures: 5/4-right cephalomedullary nailing for right proximal femoral fracture  Microbiology summarized: None  Assessment and plan: Right femoral fracture secondary to unwitnessed fall at nursing home, POA. -S/p right cephalomedullary nailing by orthopedic surgery -Pain control per surgery.  Will schedule Tylenol  to spare opiates -SCD for VTE prophylaxis.  Will avoid pharmacologic VTE prophylaxis given SDH -PT/OT  Severe dementia without behavioral disturbance: Sleepy but wakes to voice.  Does not interact or follow commands. -Reorientation delirium precaution -Minimize avoid sedating medications  Progressive subdural hematoma with brain compression: Hospitalized for this from 4/29-5/2 and discharged to nursing home with hospice.  Unclear if she is still taking the Plavix  and aspirin  or not.   CT head with stable SDH and 5 mm rightward midline shift -Avoid anticoagulation or antiplatelets  CKD-3B: Creatinine slightly up. Recent Labs    09/10/23 0511 09/11/23 0511 09/12/23 0506 10/21/23 0604 10/22/23 0352 10/23/23 0424 10/24/23 0516 10/25/23 1742 10/26/23 0511 10/27/23 0703  BUN 19 23 24* 25* 19 30* 26* 28* 28* 41*  CREATININE 1.00 1.00 1.06* 1.01* 0.95 1.50* 1.11* 1.36* 1.38* 1.51*  -Continue gentle IV fluid -Continue holding losartan .  Last dose on 5/4.  Postoperative blood loss anemia: Baseline Hgb appears to be 10-11. Recent Labs    07/26/23 1212 09/08/23 2215 09/09/23 0754 10/21/23 0604 10/22/23 0352 10/23/23 0424 10/24/23 0516 10/25/23 1742 10/26/23 0511 10/27/23 0703  HGB 13.0 11.1* 12.2 11.2* 10.2* 10.7*  11.1* 12.4 10.7* 8.8*  - Continue monitoring   Essential hypertension: Normotensive off antihypertensive meds. - Continue holding losartan .  Advance care planning-recently hospitalized and discharged to nursing home with hospice.  She is DNR -Likely discharge back to nursing home with hospice    Leukocytosis: Likely demargination -Continue monitoring  Body mass index is 20.2 kg/m.          DVT prophylaxis:  Place and maintain sequential compression device Start: 10/26/23 1324 SCDs Start: 10/26/23 0931 SCDs Start: 10/26/23 0102  Code Status: DNR-Limited Family Communication: Updated patient's son, Rodman Clam over the phone on 5/4.  None at bedside today. Level of care: Med-Surg Status is: Inpatient Remains inpatient appropriate because: Right hip fracture   Final disposition: SNF   55 minutes with more than 50% spent in reviewing records, counseling patient/family and coordinating care.   Sch Meds:  Scheduled Meds:  acetaminophen   1,000 mg Oral Q6H WA   docusate sodium  100 mg Oral BID   Continuous Infusions:  lactated ringers  75 mL/hr at 10/27/23 1115   tranexamic acid Stopped (10/26/23 1010)   PRN Meds:.HYDROcodone -acetaminophen , labetalol , melatonin, methocarbamol **OR** methocarbamol (ROBAXIN) injection, morphine  injection, polyethylene glycol, prochlorperazine  Antimicrobials: Anti-infectives (From admission, onward)    Start     Dose/Rate Route Frequency Ordered Stop   10/26/23 1800  ceFAZolin (ANCEF) IVPB 2g/100 mL premix        2 g 200 mL/hr over 30 Minutes Intravenous Every 8 hours 10/26/23 0930 10/27/23 1430   10/26/23 0700  ceFAZolin (ANCEF) 2-4 GM/100ML-% IVPB       Note to Pharmacy: Chaneta Comer: cabinet override      10/26/23 0700 10/26/23 0951        I have personally reviewed the following labs and images: CBC: Recent Labs  Lab 10/21/23 0604 10/22/23 0352 10/23/23 0424 10/24/23 0516 10/25/23 1742 10/26/23 0511 10/27/23 0703  WBC 9.1   < > 8.5 10.0 13.6* 10.6* 11.4*  NEUTROABS 6.5  --   --   --  9.9*  --   --   HGB 11.2*   < > 10.7* 11.1* 12.4 10.7* 8.8*  HCT 35.6*   < > 33.5* 36.1 39.1 33.7* 28.7*  MCV 101.4*   < > 100.3* 100.6* 100.0 99.1 102.5*  PLT 232   < > 233 234 226 192 201   < > = values in this interval not  displayed.   BMP &GFR Recent Labs  Lab 10/23/23 0424 10/24/23 0516 10/25/23 1742 10/26/23 0511 10/27/23 0703  NA 135 140 137 138 141  K 4.4 4.4 4.9 4.5 4.5  CL 101 104 101 105 107  CO2 26 27 22 23  20*  GLUCOSE 99 94 110* 110* 107*  BUN 30* 26* 28* 28* 41*  CREATININE 1.50* 1.11* 1.36* 1.38* 1.51*  CALCIUM  9.1 9.2 9.5 9.0 8.6*  MG 2.1 2.1  --  2.1 2.4  PHOS  --   --   --  4.6 6.5*   Estimated Creatinine Clearance: 23.4 mL/min (A) (by C-G formula based on SCr of 1.51 mg/dL (H)). Liver & Pancreas: Recent Labs  Lab 10/27/23 0703  ALBUMIN 2.9*   No results for input(s): "LIPASE", "AMYLASE" in the last 168 hours. No results for input(s): "AMMONIA" in the last 168 hours. Diabetic: No results for input(s): "HGBA1C" in the last 72 hours. No results for input(s): "GLUCAP" in the last 168 hours. Cardiac Enzymes: No results for input(s): "CKTOTAL", "CKMB", "CKMBINDEX", "TROPONINI" in the last 168 hours. No  results for input(s): "PROBNP" in the last 8760 hours. Coagulation Profile: Recent Labs  Lab 10/25/23 1742  INR 1.0   Thyroid  Function Tests: No results for input(s): "TSH", "T4TOTAL", "FREET4", "T3FREE", "THYROIDAB" in the last 72 hours. Lipid Profile: No results for input(s): "CHOL", "HDL", "LDLCALC", "TRIG", "CHOLHDL", "LDLDIRECT" in the last 72 hours. Anemia Panel: No results for input(s): "VITAMINB12", "FOLATE", "FERRITIN", "TIBC", "IRON", "RETICCTPCT" in the last 72 hours. Urine analysis:    Component Value Date/Time   COLORURINE STRAW (A) 10/21/2023 0604   APPEARANCEUR CLEAR 10/21/2023 0604   LABSPEC 1.010 10/21/2023 0604   PHURINE 7.0 10/21/2023 0604   GLUCOSEU NEGATIVE 10/21/2023 0604   HGBUR NEGATIVE 10/21/2023 0604   BILIRUBINUR NEGATIVE 10/21/2023 0604   KETONESUR NEGATIVE 10/21/2023 0604   PROTEINUR 100 (A) 10/21/2023 0604   UROBILINOGEN 0.2 11/30/2014 1221   NITRITE NEGATIVE 10/21/2023 0604   LEUKOCYTESUR NEGATIVE 10/21/2023 0604   Sepsis  Labs: Invalid input(s): "PROCALCITONIN", "LACTICIDVEN"  Microbiology: Recent Results (from the past 240 hours)  Resp panel by RT-PCR (RSV, Flu A&B, Covid) Anterior Nasal Swab     Status: None   Collection Time: 10/21/23  6:19 AM   Specimen: Anterior Nasal Swab  Result Value Ref Range Status   SARS Coronavirus 2 by RT PCR NEGATIVE NEGATIVE Final    Comment: (NOTE) SARS-CoV-2 target nucleic acids are NOT DETECTED.  The SARS-CoV-2 RNA is generally detectable in upper respiratory specimens during the acute phase of infection. The lowest concentration of SARS-CoV-2 viral copies this assay can detect is 138 copies/mL. A negative result does not preclude SARS-Cov-2 infection and should not be used as the sole basis for treatment or other patient management decisions. A negative result may occur with  improper specimen collection/handling, submission of specimen other than nasopharyngeal swab, presence of viral mutation(s) within the areas targeted by this assay, and inadequate number of viral copies(<138 copies/mL). A negative result must be combined with clinical observations, patient history, and epidemiological information. The expected result is Negative.  Fact Sheet for Patients:  BloggerCourse.com  Fact Sheet for Healthcare Providers:  SeriousBroker.it  This test is no t yet approved or cleared by the [place]  FDA and  has been authorized for detection and/or diagnosis of SARS-CoV-2 by FDA under an Emergency Use Authorization (EUA). This EUA will remain  in effect (meaning this test can be used) for the duration of the COVID-19 declaration under Section 564(b)(1) of the Act, 21 U.S.C.section 360bbb-3(b)(1), unless the authorization is terminated  or revoked sooner.       Influenza A by PCR NEGATIVE NEGATIVE Final   Influenza B by PCR NEGATIVE NEGATIVE Final    Comment: (NOTE) The Xpert Xpress SARS-CoV-2/FLU/RSV plus  assay is intended as an aid in the diagnosis of influenza from Nasopharyngeal swab specimens and should not be used as a sole basis for treatment. Nasal washings and aspirates are unacceptable for Xpert Xpress SARS-CoV-2/FLU/RSV testing.  Fact Sheet for Patients: BloggerCourse.com  Fact Sheet for Healthcare Providers: SeriousBroker.it  This test is not yet approved or cleared by the [place]  FDA and has been authorized for detection and/or diagnosis of SARS-CoV-2 by FDA under an Emergency Use Authorization (EUA). This EUA will remain in effect (meaning this test can be used) for the duration of the COVID-19 declaration under Section 564(b)(1) of the Act, 21 U.S.C. section 360bbb-3(b)(1), unless the authorization is terminated or revoked.     Resp Syncytial Virus by PCR NEGATIVE NEGATIVE Final    Comment: (NOTE) Fact Sheet  for Patients: BloggerCourse.com  Fact Sheet for Healthcare Providers: SeriousBroker.it  This test is not yet approved or cleared by the [place]  FDA and has been authorized for detection and/or diagnosis of SARS-CoV-2 by FDA under an Emergency Use Authorization (EUA). This EUA will remain in effect (meaning this test can be used) for the duration of the COVID-19 declaration under Section 564(b)(1) of the Act, 21 U.S.C. section 360bbb-3(b)(1), unless the authorization is terminated or revoked.  Performed at [facility], [address] [place], [place] [address]   MRSA Next Gen by PCR, Nasal     Status: None   Collection Time: 10/21/23  2:15 PM   Specimen: Nasal Mucosa; Nasal Swab  Result Value Ref Range Status   MRSA by PCR Next Gen NOT DETECTED NOT DETECTED Final    Comment: (NOTE) The GeneXpert MRSA Assay (FDA approved for NASAL specimens only), is one component of a comprehensive MRSA colonization surveillance program. It is not intended to  diagnose MRSA infection nor to guide or monitor treatment for MRSA infections. Test performance is not FDA approved in patients less than 68 years old. Performed at [facility], [address] [place], [place] [address]     Radiology Studies: No results found.     Yoseph Haile T. Oshen Wlodarczyk [facility]  If 7PM-7AM, please contact night-coverage [email or web address] 10/27/2023, 2:35 PM

## 2023-10-27 NOTE — TOC Progression Note (Signed)
 Transition of Care [facility]) - Progression Note    Patient Details  Name: Connie Wood MRN: [id number] Date of Birth: [birth year]  Transition of Care [facility]) CM/SW Contact  Katrinka Parr, [place] Phone Number: 10/27/2023, 4:25 PM  Clinical Narrative:     CSW confirmed with Carletha Check that pt is a LTC resident there with [facility]. Pt can return at DC.   CSW spoke with pt's son and confirmed plan for pt to return to [place] with Hospice on DC.                  Social Determinants of Health (SDOH) Interventions SDOH Screenings   Food Insecurity: No Food Insecurity (10/21/2023)  Housing: Low Risk  (10/21/2023)  Transportation Needs: No Transportation Needs (10/21/2023)  Utilities: Not At Risk (10/21/2023)  Social Connections: Patient Unable To Answer (10/21/2023)  Tobacco Use: Medium Risk (10/26/2023)    Readmission Risk Interventions    09/12/2023    1:27 PM  Readmission Risk Prevention Plan  Transportation Screening Complete  PCP or Specialist Appt within 5-7 Days Not Complete  Home Care Screening Complete  Medication Review (RN CM) Complete

## 2023-10-27 NOTE — TOC CAGE-AID Note (Signed)
 Transition of Care [facility]) - CAGE-AID Screening   Patient Details  Name: Connie Wood MRN: [id number] Date of Birth: 1941-04-13  Transition of Care [facility]) CM/SW Contact:    Cornelio Parkerson E Demarr Kluever, LCSW Phone Number: 10/27/2023, 9:38 AM   Clinical Narrative: Per chart review - patient with memory impairment.   CAGE-AID Screening: Substance Abuse Screening unable to be completed due to: : Patient unable to participate (patient with memory impairment)

## 2023-10-28 ENCOUNTER — Encounter (HOSPITAL_COMMUNITY): Payer: Self-pay | Admitting: Student

## 2023-10-28 DIAGNOSIS — I10 Essential (primary) hypertension: Secondary | ICD-10-CM | POA: Diagnosis not present

## 2023-10-28 DIAGNOSIS — S065XAA Traumatic subdural hemorrhage with loss of consciousness status unknown, initial encounter: Secondary | ICD-10-CM | POA: Diagnosis not present

## 2023-10-28 DIAGNOSIS — S7291XA Unspecified fracture of right femur, initial encounter for closed fracture: Secondary | ICD-10-CM | POA: Diagnosis not present

## 2023-10-28 DIAGNOSIS — F03C0 Unspecified dementia, severe, without behavioral disturbance, psychotic disturbance, mood disturbance, and anxiety: Secondary | ICD-10-CM | POA: Diagnosis not present

## 2023-10-28 DIAGNOSIS — N1832 Chronic kidney disease, stage 3b: Secondary | ICD-10-CM

## 2023-10-28 LAB — CBC
HCT: 27.6 % — ABNORMAL LOW (ref 36.0–46.0)
Hemoglobin: 8.6 g/dL — ABNORMAL LOW (ref 12.0–15.0)
MCH: 30.8 pg (ref 26.0–34.0)
MCHC: 31.2 g/dL (ref 30.0–36.0)
MCV: 98.9 fL (ref 80.0–100.0)
Platelets: 215 10*3/uL (ref 150–400)
RBC: 2.79 MIL/uL — ABNORMAL LOW (ref 3.87–5.11)
RDW: 13.2 % (ref 11.5–15.5)
WBC: 10.7 10*3/uL — ABNORMAL HIGH (ref 4.0–10.5)
nRBC: 0 % (ref 0.0–0.2)

## 2023-10-28 LAB — RENAL FUNCTION PANEL
Albumin: 2.9 g/dL — ABNORMAL LOW (ref 3.5–5.0)
Anion gap: 8 (ref 5–15)
BUN: 45 mg/dL — ABNORMAL HIGH (ref 8–23)
CO2: 25 mmol/L (ref 22–32)
Calcium: 8.6 mg/dL — ABNORMAL LOW (ref 8.9–10.3)
Chloride: 103 mmol/L (ref 98–111)
Creatinine, Ser: 1.43 mg/dL — ABNORMAL HIGH (ref 0.44–1.00)
GFR, Estimated: 37 mL/min — ABNORMAL LOW (ref >60)
Glucose, Bld: 161 mg/dL — ABNORMAL HIGH (ref 70–99)
Phosphorus: 3.5 mg/dL (ref 2.5–4.6)
Potassium: 4.1 mmol/L (ref 3.5–5.1)
Sodium: 136 mmol/L (ref 135–145)

## 2023-10-28 LAB — MAGNESIUM: Magnesium: 2.4 mg/dL (ref 1.7–2.4)

## 2023-10-28 NOTE — Progress Notes (Addendum)
 Orthopaedic Trauma Progress Note  SUBJECTIVE: Resting comfortably in bed this morning.  Patient with advanced dementia, unable to contribute much to the exam.   Due to subdural hematoma, patient was taken off of antiplatelet therapies following most recent hospitalization at [facility].  Will plan to keep patient off of aspirin  and Plavix   OBJECTIVE:  Vitals:   10/28/23 0531 10/28/23 0723  BP: (!) 129/58 (!) 150/73  Pulse: 99   Resp: 20 16  Temp: 98 F (36.7 C) 98.6 F (37 C)  SpO2: 99% 100%    Opiates Today (MME): Today's  total administered Morphine  Milligram Equivalents: 0 Opiates Yesterday (MME): Yesterday's total administered Morphine  Milligram Equivalents: 0  General: Resting comfortably in bed no acute distress Respiratory: No increased work of breathing.  Operative Extremity (RLE): Dressings clean, dry, intact.  No obvious signs of discomfort with palpation over the hip or throughout the thigh.  No obvious calf tenderness.  Tolerates gentle passive ankle motion. + DP pulse  IMAGING: Stable post op imaging.   LABS:  No results found for this or any previous visit (from the past 24 hours).   ASSESSMENT: Connie Wood is a 83 y.o. female, 2 Days Post-Op s/p ground-level fall at SNF Procedures: INTRAMEDULLARY NAILING RIGHT INTERTROCHANTERIC FEMUR FRACTURE  CV/Blood loss: Acute blood loss anemia, Hgb 8.8 on 10/27/2023.  CBC pending this AM. Hemodynamically stable  PLAN: Weightbearing: WBAT RLE ROM: Unrestricted ROM  Incisional and dressing care: Okay to remove dressing Incisions open to air Showering: Okay to be getting incisions wet 10/29/2023 Orthopedic device(s): None  Pain management: Continue multimodal pain control. VTE prophylaxis: Avoid pharmacologic VTE prophylaxis given SDH.  SCDs only.  ID:  Ancef 2gm post op completed Foley/Lines:  No foley, KVO IVFs Impediments to Fracture Healing: Vitamin D level looks good at 38, no additional supplementation  needed. Dispo: PT/OT evaluation ongoing, discharge back to SNF when cleared by medicine team.    D/C recommendations: - Norco, Tylenol  for pain control - No pharmacologic DVT prophylaxis - No additional need for Vit D supplementation  Follow - up plan: 2 weeks after d/c for wound check and repeat x-rays   Contact information:  Katheryne Pane MD, Alona Jamaica PA-C. After hours and holidays please check [email or web address] for group call information for [facility]   Edilia Gordon, PA-C [phone] (office) [email or web address]

## 2023-10-28 NOTE — TOC Transition Note (Signed)
 Transition of Care [facility]) - Discharge Note   Patient Details  Name: Connie Wood MRN: [id number] Date of Birth: [birth year]  Transition of Care [facility]) CM/SW Contact:  Katrinka Parr, LCSW Phone Number: 10/28/2023, 11:33 AM   Clinical Narrative:        Per MD patient ready for DC to [facility]. RN, patient, patient's family, and facility notified of DC. Discharge Summary and FL2 sent to facility. RN to call report prior to discharge [phone] ). DC packet on chart. Ambulance transport requested for patient.   CSW will sign off for now as social work intervention is no longer needed. Please consult us  again if new needs arise.  Final next level of care: Skilled Nursing Facility Barriers to Discharge: No Barriers Identified      Discharge Placement              Patient chooses bed at: [facility] Patient to be transferred to facility by: [facility] Name of family member notified: Left message with son brad Patient and family notified of of transfer: 10/28/23  Discharge Plan and Services Additional resources added to the After Visit Summary for                                       Social Drivers of Health (SDOH) Interventions SDOH Screenings   Food Insecurity: No Food Insecurity (10/28/2023)  Housing: Patient Unable To Answer (10/28/2023)  Transportation Needs: No Transportation Needs (10/28/2023)  Utilities: Not At Risk (10/28/2023)  Social Connections: Patient Unable To Answer (10/28/2023)  Tobacco Use: Medium Risk (10/26/2023)     Readmission Risk Interventions    09/12/2023    1:27 PM  Readmission Risk Prevention Plan  Transportation Screening Complete  PCP or Specialist Appt within 5-7 Days Not Complete  Home Care Screening Complete  Medication Review (RN CM) Complete

## 2023-10-28 NOTE — Progress Notes (Signed)
 AVS in packet for transport.

## 2023-10-28 NOTE — Progress Notes (Signed)
 Connie Wood to be D/C'd Skilled nursing facility per MD order.  Discussed with the patient and all questions fully answered.  VSS, Skin clean, dry and intact without evidence of skin break down, no evidence of skin tears noted. IV catheter discontinued intact. Site without signs and symptoms of complications. Dressing and pressure applied.  An After Visit Summary was printed and given to [facility]. Patient prescription signed and placed in packet for facility.  Report called to Trevor Fudge, LPN at [facility].  Patient escorted via stretcher, and D/C to [facility] via non emergency ambulance.  Lonnell Rob 10/28/2023 11:36 AM

## 2023-10-28 NOTE — Discharge Summary (Signed)
 Physician Discharge Summary  Connie Wood ZOX:[id number] DOB: 1940/08/10 DOA: 10/25/2023  PCP: [name], NP  Admit date: 10/25/2023 Discharge date: 10/28/23  Admitted From: SNF Disposition: SNF Recommendations for Outpatient Follow-up:  Outpatient follow-up with orthopedic surgery as below Hospice/palliative follow-up at SNF Check CBC and CMP in 1 week Please follow up on the following pending results: None  Discharge Condition: Stable CODE STATUS: DNR  Follow-up Information     Haddix, [name], MD. Schedule an appointment as soon as possible for a visit in 2 week(s).   Specialty: Orthopedic Surgery Why: For wound check and repeat x-rays Contact information: [address] [place] [address] [phone]                 Hospital course 83 year old F with PMH of severe dementia, CVA, subdural hematoma, hearing loss, CKD-3B, HTN and HLD brought to ED by EMS from nursing home after found down on ground likely due to unwitnessed fall, and found to have right proximal femoral fracture.  Patient was hospitalized at [facility] from 4/29-5/2 due to progressive subdural hematoma and discharged to nursing home with hospice. CT head with stable bilateral subdural hematoma.  Orthopedic surgery consulted.    Patient underwent right cephalomedullary nailing by Dr. Curtiss Dowdy on 5/4.   See individual problem list below for more.   Problems addressed during this hospitalization Right femoral fracture secondary to unwitnessed fall at nursing home, POA. -S/p right cephalomedullary nailing by orthopedic surgery -Norco and Tylenol  for pain -SCD for VTE prophylaxis.  Will avoid pharmacologic VTE prophylaxis given SDH -Outpatient follow-up with orthopedic surgery as above -Hospice/palliative follow-up at SNF   Severe dementia without behavioral disturbance: Sleepy but wakes to voice.  Does not interact or follow commands. -Reorientation delirium  precaution -Minimize avoid sedating medications   Progressive subdural hematoma with brain compression: Hospitalized for this from 4/29-5/2 and discharged to nursing home with hospice.  Unclear if she is still taking the Plavix  and aspirin  or not.   CT head with stable SDH and 5 mm rightward midline shift -Avoid anticoagulation or antiplatelets   CKD-3B: Stable. Recent Labs    09/11/23 0511 09/12/23 0506 10/21/23 0604 10/22/23 0352 10/23/23 0424 10/24/23 0516 10/25/23 1742 10/26/23 0511 10/27/23 0703 10/28/23 0838  BUN 23 24* 25* 19 30* 26* 28* 28* 41* 45*  CREATININE 1.00 1.06* 1.01* 0.95 1.50* 1.11* 1.36* 1.38* 1.51* 1.43*  -Check renal function in 1 week - Discontinue losartan     Postoperative blood loss anemia: Hgb dropped about 1 g postoperatively.  Stable. Recent Labs    09/08/23 2215 09/09/23 0754 10/21/23 0604 10/22/23 0352 10/23/23 0424 10/24/23 0516 10/25/23 1742 10/26/23 0511 10/27/23 0703 10/28/23 0838  HGB 11.1* 12.2 11.2* 10.2* 10.7* 11.1* 12.4 10.7* 8.8* 8.6*  -Check CBC in 1 week  Essential hypertension: Normotensive off antihypertensive meds. - Discontinue losartan    Advance care planning-recently hospitalized and discharged to nursing home with hospice.  She is DNR - Hospice/palliative follow-up at SNF   Leukocytosis: Likely demargination.  Improved without antibiotics - Recheck CBC in 1 week            Time spent 35 minutes  Vital signs Vitals:   10/27/23 1923 10/28/23 0037 10/28/23 0531 10/28/23 0723  BP: 126/68 126/72 (!) 129/58 (!) 150/73  Pulse: (!) 110 98 99   Temp: 99.3 F (37.4 C) 98 F (36.7 C) 98 F (36.7 C) 98.6 F (37 C)  Resp: 18 20 20  16  Height:      Weight:      SpO2: 94% 95% 99% 100%  TempSrc: Oral Oral Oral   BMI (Calculated):         Discharge exam GENERAL: No apparent distress.  Nontoxic. HEENT: MMM.  Vision grossly intact.  Significantly diminished hearing NECK: Supple.  No apparent JVD.  RESP:  No  IWOB.  Fair aeration bilaterally. CVS:  RRR. Heart sounds normal.  ABD/GI/GU: BS+. Abd soft, NTND.  MSK/EXT:  Moves extremities. No apparent deformity. No edema.  SKIN: no apparent skin lesion or wound.  Surgery call dressing DCI. NEURO: Sleepy but awakes to gentle tactile stimuli.  Oriented to self.  Follows some commands.  No apparent focal neuro deficit. PSYCH: Calm. Normal affect.    Discharge Instructions  Allergies as of 10/28/2023       Reactions   Adhesive [tape] Other (See Comments)   Tears skin   Doxycycline  Nausea And Vomiting   Latex Itching, Rash        Medication List     STOP taking these medications    aspirin  81 MG chewable tablet   clopidogrel  75 MG tablet Commonly known as: PLAVIX    hydrOXYzine  50 MG capsule Commonly known as: VISTARIL    losartan  25 MG tablet Commonly known as: Cozaar    potassium chloride  SA 20 MEQ tablet Commonly known as: KLOR-CON  M       TAKE these medications    acetaminophen  500 MG tablet Commonly known as: TYLENOL  Take 2 tablets (1,000 mg total) by mouth every 8 (eight) hours as needed for mild pain, moderate pain or headache.   albuterol  108 (90 Base) MCG/ACT inhaler Commonly known as: VENTOLIN  HFA Inhale 2 puffs into the lungs every 6 (six) hours as needed for wheezing or shortness of breath.   atorvastatin  40 MG tablet Commonly known as: LIPITOR  Take 40 mg by mouth at bedtime.   donepezil  10 MG tablet Commonly known as: ARICEPT  Take 10 mg by mouth at bedtime.   fluticasone  50 MCG/ACT nasal spray Commonly known as: FLONASE  Place 1 spray into both nostrils daily for 14 days.   HYDROcodone -acetaminophen  5-325 MG tablet Commonly known as: NORCO/VICODIN Take 0.5 tablets by mouth every 6 (six) hours as needed for moderate pain (pain score 4-6) or severe pain (pain score 7-10).   melatonin 3 MG Tabs tablet Take 3 mg by mouth at bedtime.   metoprolol  succinate 100 MG 24 hr tablet Commonly known as:  TOPROL -XL Take 100 mg by mouth daily.   QUEtiapine  25 MG tablet Commonly known as: SEROQUEL  Take 12.5 mg by mouth at bedtime.   venlafaxine  75 MG tablet Commonly known as: EFFEXOR  Take 75 mg by mouth 2 (two) times daily.        Consultations: Orthopedic surgery  Procedures/Studies: 5/4-right cephalomedullary nailing for right proximal femoral fracture    DG HIP PORT UNILAT W OR W/O PELVIS 1V RIGHT Result Date: 10/26/2023 CLINICAL DATA:  96051 Fracture 96051 EXAM: DG HIP (WITH OR WITHOUT PELVIS) 1V PORT RIGHT COMPARISON:  10/25/2023 FINDINGS: Interval sliding screw and IM rod fixation across right intertrochanteric femur fracture, with a single distal interlocking screw. Normal alignment. No dislocation. Scattered femoral arterial calcifications. IMPRESSION: Interval ORIF right intertrochanteric femur fracture. Electronically Signed   By: Nicoletta Barrier M.D.   On: 10/26/2023 11:48   DG HIP UNILAT WITH PELVIS 2-3 VIEWS RIGHT Result Date: 10/26/2023 CLINICAL DATA:  Fluoro guidance provided EXAM: DG HIP (WITH OR WITHOUT PELVIS) 2-3V RIGHT FINDINGS: Dose: Cumulative  Air Kerma 4.2mG y Fluoro time 42s IMPRESSION: C-arm fluoro guidance provided during right hip ORIF. Electronically Signed   By: Sydell Eva M.D.   On: 10/26/2023 09:36   DG C-Arm 1-60 Min-No Report Result Date: 10/26/2023 Fluoroscopy was utilized by the requesting physician.  No radiographic interpretation.   CT Head Wo Contrast Result Date: 10/25/2023 CLINICAL DATA:  Status post fall with head trauma EXAM: CT HEAD WITHOUT CONTRAST TECHNIQUE: Contiguous axial images were obtained from the base of the skull through the vertex without intravenous contrast. RADIATION DOSE REDUCTION: This exam was performed according to the departmental dose-optimization program which includes automated exposure control, adjustment of the mA and/or kV according to patient size and/or use of iterative reconstruction technique. COMPARISON:  CT head  10/21/2023 FINDINGS: Brain: No substantial change from 10/21/2023. Left-sided mixed density subdural hematoma with septations on the left. This measures up to 2.2 cm in thickness, unchanged using similar measuring technique. Small low-density right-sided subdural hematoma is also unchanged measuring up to 6 mm in radial dimension. 5 mm of rightward midline shift, unchanged. No new hemorrhage. No evidence of acute infarct. Stable ventricular caliber. Vascular: No hyperdense vessel or unexpected calcification. Skull: No acute fracture. Sinuses/Orbits: Chronic opacification of a few mastoid air cells bilaterally. Mucosal thickening in the right maxillary sinus. Paranasal sinuses are otherwise well aerated. Other: None. IMPRESSION: 1. No substantial change from 10/21/2023. 2. Left-sided mixed density subdural hematoma with septations measuring up to 2.2 cm in thickness. 3. Small low-density right-sided subdural hematoma measuring up to 6 mm in radial dimension. 4. 5 mm of rightward midline shift. Electronically Signed   By: Rozell Cornet M.D.   On: 10/25/2023 21:16   DG Hip Unilat With Pelvis 2-3 Views Right Result Date: 10/25/2023 CLINICAL DATA:  Trauma and hip shortening EXAM: DG HIP (WITH OR WITHOUT PELVIS) 2-3V RIGHT COMPARISON:  CT of 07/26/2023 FINDINGS: Vascular calcifications. Femoral heads are located. Osteopenia. Prior sacral plasty. Lower lumbar spondylosis. Artifact degraded attempted lateral views. Mildly comminuted right femoral neck fracture with varus angulation and impaction. Likely at the level of the base of the femoral neck versus intertrochanteric region. IMPRESSION: Impacted, varus angulated right proximal femur fracture. Electronically Signed   By: Lore Rode M.D.   On: 10/25/2023 18:15   DG Chest Port 1 View Result Date: 10/25/2023 CLINICAL DATA:  Shortness of breath.  Trauma. EXAM: PORTABLE CHEST 1 VIEW COMPARISON:  Chest CT dated 09/09/2023. FINDINGS: No focal consolidation, pleural  effusion or pneumothorax. The cardiac silhouette is within normal limits. Atherosclerotic calcification of the aortic arch. No acute osseous pathology. IMPRESSION: No active disease. Electronically Signed   By: Angus Bark M.D.   On: 10/25/2023 18:13   DG Chest Port 1 View Result Date: 10/21/2023 CLINICAL DATA:  Shortness of breath. EXAM: PORTABLE CHEST 1 VIEW COMPARISON:  09/08/2023 FINDINGS: Diffuse interstitial prominence again noted with a basilar predominance, right greater than left. No dense focal consolidative airspace disease or substantial pleural effusion. The cardiopericardial silhouette is within normal limits for size. No acute bony abnormality. Telemetry leads overlie the chest. IMPRESSION: Diffuse interstitial prominence with a basilar predominance, right greater than left. While likely chronic, imaging features could be related to edema or infection. Electronically Signed   By: Donnal Fusi M.D.   On: 10/21/2023 06:30   CT HEAD WO CONTRAST ( ) Result Date: 10/21/2023 CLINICAL DATA:  83 year old female found with altered mental status, shortness of breath. EXAM: CT HEAD WITHOUT CONTRAST TECHNIQUE: Contiguous axial images were obtained from  the base of the skull through the vertex without intravenous contrast. RADIATION DOSE REDUCTION: This exam was performed according to the departmental dose-optimization program which includes automated exposure control, adjustment of the mA and/or kV according to patient size and/or use of iterative reconstruction technique. COMPARISON:  Head CT 07/26/2023. FINDINGS: Brain: Left greater than right low-density subdural hematoma in February, left side subdural now septated, intermittently hyperdense, and larger measuring up to 2 cm in thickness (previously 14-15 mm (coronal image 28). Contralateral right side subdural remains hypodense and appears smaller, 4-5 mm (versus 8-9 mm previously). Intracranial mass effect with rightward midline shift not  significantly changed, 5-6 mm (series 2, image 18 today versus series 3, image 19 previously). Stable partially effaced left lateral ventricle. Basilar cisterns remain patent. No new areas of intracranial hemorrhage. Stable gray-white matter differentiation throughout the brain. No cortically based acute infarct identified. Widespread cerebral white matter hypodensity including deep white matter capsule involvement. Vascular: Generalized intracranial artery tortuosity. Calcified atherosclerosis at the skull base. No suspicious intracranial vascular hyperdensity. Skull: Stable and intact. Sinuses/Orbits: Improved left middle ear and mastoid aeration since February. Otherwise stable paranasal sinuses, right middle ear and mastoid aeration. Other: No acute orbit or scalp soft tissue finding. IMPRESSION: 1. Chronic Left subdural Hematoma with progressed loculation and hyperdense blood products since February, increased by up to 5 mm thickness (up to 20 mm now), although associated intracranial mass effect with 5-6 mm of rightward midline shift is stable. 2. Right side SDH remains low-density and is smaller, 4-5 mm now. 3. No other acute intracranial abnormality. Chronic white matter disease, intracranial artery tortuosity. Electronically Signed   By: Marlise Simpers M.D.   On: 10/21/2023 06:21       The results of significant diagnostics from this hospitalization (including imaging, microbiology, ancillary and laboratory) are listed below for reference.     Microbiology: Recent Results (from the past 240 hours)  Resp panel by RT-PCR (RSV, Flu A&B, Covid) Anterior Nasal Swab     Status: None   Collection Time: 10/21/23  6:19 AM   Specimen: Anterior Nasal Swab  Result Value Ref Range Status   SARS Coronavirus 2 by RT PCR NEGATIVE NEGATIVE Final    Comment: (NOTE) SARS-CoV-2 target nucleic acids are NOT DETECTED.  The SARS-CoV-2 RNA is generally detectable in upper respiratory specimens during the acute phase  of infection. The lowest concentration of SARS-CoV-2 viral copies this assay can detect is 138 copies/mL. A negative result does not preclude SARS-Cov-2 infection and should not be used as the sole basis for treatment or other patient management decisions. A negative result may occur with  improper specimen collection/handling, submission of specimen other than nasopharyngeal swab, presence of viral mutation(s) within the areas targeted by this assay, and inadequate number of viral copies(<138 copies/mL). A negative result must be combined with clinical observations, patient history, and epidemiological information. The expected result is Negative.  Fact Sheet for Patients:  BloggerCourse.com  Fact Sheet for Healthcare Providers:  SeriousBroker.it  This test is no t yet approved or cleared by the [place]  FDA and  has been authorized for detection and/or diagnosis of SARS-CoV-2 by FDA under an Emergency Use Authorization (EUA). This EUA will remain  in effect (meaning this test can be used) for the duration of the COVID-19 declaration under Section 564(b)(1) of the Act, 21 U.S.C.section 360bbb-3(b)(1), unless the authorization is terminated  or revoked sooner.       Influenza A by PCR NEGATIVE NEGATIVE Final  Influenza B by PCR NEGATIVE NEGATIVE Final    Comment: (NOTE) The Xpert Xpress SARS-CoV-2/FLU/RSV plus assay is intended as an aid in the diagnosis of influenza from Nasopharyngeal swab specimens and should not be used as a sole basis for treatment. Nasal washings and aspirates are unacceptable for Xpert Xpress SARS-CoV-2/FLU/RSV testing.  Fact Sheet for Patients: BloggerCourse.com  Fact Sheet for Healthcare Providers: SeriousBroker.it  This test is not yet approved or cleared by the [place]  FDA and has been authorized for detection and/or diagnosis of  SARS-CoV-2 by FDA under an Emergency Use Authorization (EUA). This EUA will remain in effect (meaning this test can be used) for the duration of the COVID-19 declaration under Section 564(b)(1) of the Act, 21 U.S.C. section 360bbb-3(b)(1), unless the authorization is terminated or revoked.     Resp Syncytial Virus by PCR NEGATIVE NEGATIVE Final    Comment: (NOTE) Fact Sheet for Patients: BloggerCourse.com  Fact Sheet for Healthcare Providers: SeriousBroker.it  This test is not yet approved or cleared by the [place]  FDA and has been authorized for detection and/or diagnosis of SARS-CoV-2 by FDA under an Emergency Use Authorization (EUA). This EUA will remain in effect (meaning this test can be used) for the duration of the COVID-19 declaration under Section 564(b)(1) of the Act, 21 U.S.C. section 360bbb-3(b)(1), unless the authorization is terminated or revoked.  Performed at [facility], [address], [place], [place] [address]   MRSA Next Gen by PCR, Nasal     Status: None   Collection Time: 10/21/23  2:15 PM   Specimen: Nasal Mucosa; Nasal Swab  Result Value Ref Range Status   MRSA by PCR Next Gen NOT DETECTED NOT DETECTED Final    Comment: (NOTE) The GeneXpert MRSA Assay (FDA approved for NASAL specimens only), is one component of a comprehensive MRSA colonization surveillance program. It is not intended to diagnose MRSA infection nor to guide or monitor treatment for MRSA infections. Test performance is not FDA approved in patients less than 51 years old. Performed at [facility], [address] [place], [place] [address]      Labs:  CBC: Recent Labs  Lab 10/24/23 [phone] 10/25/23 1742 10/26/23 0511 10/27/23 0703 10/28/23 0838  WBC 10.0 13.6* 10.6* 11.4* 10.7*  NEUTROABS  --  9.9*  --   --   --   HGB 11.1* 12.4 10.7* 8.8* 8.6*  HCT 36.1 39.1 33.7* 28.7* 27.6*  MCV 100.6* 100.0 99.1 102.5* 98.9  PLT 234  226 192 201 215   BMP &GFR Recent Labs  Lab 10/23/23 0424 10/24/23 0516 10/25/23 1742 10/26/23 0511 10/27/23 0703 10/28/23 0838  NA 135 140 137 138 141 136  K 4.4 4.4 4.9 4.5 4.5 4.1  CL 101 104 101 105 107 103  CO2 26 27 22 23  20* 25  GLUCOSE 99 94 110* 110* 107* 161*  BUN 30* 26* 28* 28* 41* 45*  CREATININE 1.50* 1.11* 1.36* 1.38* 1.51* 1.43*  CALCIUM  9.1 9.2 9.5 9.0 8.6* 8.6*  MG 2.1 2.1  --  2.1 2.4 2.4  PHOS  --   --   --  4.6 6.5* 3.5   Estimated Creatinine Clearance: 24.8 mL/min (A) (by C-G formula based on SCr of 1.43 mg/dL (H)). Liver & Pancreas: Recent Labs  Lab 10/27/23 0703 10/28/23 0838  ALBUMIN 2.9* 2.9*   No results for input(s): "LIPASE", "AMYLASE" in the last 168 hours. No results for input(s): "AMMONIA" in the last 168 hours. Diabetic: No results for input(s): "  HGBA1C" in the last 72 hours. No results for input(s): "GLUCAP" in the last 168 hours. Cardiac Enzymes: No results for input(s): "CKTOTAL", "CKMB", "CKMBINDEX", "TROPONINI" in the last 168 hours. No results for input(s): "PROBNP" in the last 8760 hours. Coagulation Profile: Recent Labs  Lab 10/25/23 1742  INR 1.0   Thyroid  Function Tests: No results for input(s): "TSH", "T4TOTAL", "FREET4", "T3FREE", "THYROIDAB" in the last 72 hours. Lipid Profile: No results for input(s): "CHOL", "HDL", "LDLCALC", "TRIG", "CHOLHDL", "LDLDIRECT" in the last 72 hours. Anemia Panel: No results for input(s): "VITAMINB12", "FOLATE", "FERRITIN", "TIBC", "IRON", "RETICCTPCT" in the last 72 hours. Urine analysis:    Component Value Date/Time   COLORURINE STRAW (A) 10/21/2023 0604   APPEARANCEUR CLEAR 10/21/2023 0604   LABSPEC 1.010 10/21/2023 0604   PHURINE 7.0 10/21/2023 0604   GLUCOSEU NEGATIVE 10/21/2023 0604   HGBUR NEGATIVE 10/21/2023 0604   BILIRUBINUR NEGATIVE 10/21/2023 0604   KETONESUR NEGATIVE 10/21/2023 0604   PROTEINUR 100 (A) 10/21/2023 0604   UROBILINOGEN 0.2 11/30/2014 1221   NITRITE  NEGATIVE 10/21/2023 0604   LEUKOCYTESUR NEGATIVE 10/21/2023 0604   Sepsis Labs: Invalid input(s): "PROCALCITONIN", "LACTICIDVEN"   SIGNED:  Theadore Finger, MD  [facility] 10/28/2023, 4:25 PM

## 2023-10-28 NOTE — Plan of Care (Signed)

## 2023-10-28 NOTE — Anesthesia Postprocedure Evaluation (Signed)
 Anesthesia Post Note  Patient: Connie Wood  Procedure(s) Performed: FIXATION, FRACTURE, INTERTROCHANTERIC, WITH INTRAMEDULLARY ROD (Right)     Patient location during evaluation: PACU Anesthesia Type: General Level of consciousness: awake and alert Pain management: pain level controlled Vital Signs Assessment: post-procedure vital signs reviewed and stable Respiratory status: spontaneous breathing, nonlabored ventilation, respiratory function stable and patient connected to nasal cannula oxygen  Cardiovascular status: blood pressure returned to baseline and stable Postop Assessment: no apparent nausea or vomiting Anesthetic complications: no   No notable events documented.  Last Vitals:  Vitals:   10/28/23 0531 10/28/23 0723  BP: (!) 129/58 (!) 150/73  Pulse: 99   Resp: 20 16  Temp: 36.7 C 37 C  SpO2: 99% 100%    Last Pain:  Vitals:   10/28/23 0757  TempSrc:   PainSc: 0-No pain                 Kweku Stankey S

## 2023-10-28 NOTE — Progress Notes (Signed)
 Physician Discharge Summary  Connie Wood FAO:[id number] DOB: Oct 05, 1940 DOA: 10/25/2023  PCP: Connie Laine, NP  Admit date: 10/25/2023 Discharge date: 10/28/23  Admitted From: SNF Disposition: SNF Recommendations for Outpatient Follow-up:  Outpatient follow-up with orthopedic surgery as below Hospice/palliative follow-up at SNF Check CBC and CMP in 1 week Please follow up on the following pending results: None  Discharge Condition: Stable CODE STATUS: DNR  Follow-up Information     Haddix, Connie Huntsman, MD. Schedule an appointment as soon as possible for a visit in 2 week(s).   Specialty: Orthopedic Surgery Why: For wound check and repeat x-rays Contact information: [address] [place] [address] [phone]                 Hospital course 83 year old F with PMH of severe dementia, CVA, subdural hematoma, hearing loss, CKD-3B, HTN and HLD brought to ED by EMS from nursing home after found down on ground likely due to unwitnessed fall, and found to have right proximal femoral fracture.  Patient was hospitalized at [facility] from 4/29-5/2 due to progressive subdural hematoma and discharged to nursing home with hospice. CT head with stable bilateral subdural hematoma.  Orthopedic surgery consulted.    Patient underwent right cephalomedullary nailing by Dr. Curtiss Dowdy on 5/4.   See individual problem list below for more.   Problems addressed during this hospitalization Right femoral fracture secondary to unwitnessed fall at nursing home, POA. -S/p right cephalomedullary nailing by orthopedic surgery -Norco and Tylenol  for pain -SCD for VTE prophylaxis.  Will avoid pharmacologic VTE prophylaxis given SDH -Outpatient follow-up with orthopedic surgery as above -Hospice/palliative follow-up at SNF   Severe dementia without behavioral disturbance: Sleepy but wakes to voice.  Does not interact or follow commands. -Reorientation delirium  precaution -Minimize avoid sedating medications   Progressive subdural hematoma with brain compression: Hospitalized for this from 4/29-5/2 and discharged to nursing home with hospice.  Unclear if she is still taking the Plavix  and aspirin  or not.   CT head with stable SDH and 5 mm rightward midline shift -Avoid anticoagulation or antiplatelets   CKD-3B: Stable. Recent Labs    09/11/23 0511 09/12/23 0506 10/21/23 0604 10/22/23 0352 10/23/23 0424 10/24/23 0516 10/25/23 1742 10/26/23 0511 10/27/23 0703 10/28/23 0838  BUN 23 24* 25* 19 30* 26* 28* 28* 41* 45*  CREATININE 1.00 1.06* 1.01* 0.95 1.50* 1.11* 1.36* 1.38* 1.51* 1.43*  -Check renal function in 1 week - Discontinue losartan     Postoperative blood loss anemia: Hgb dropped about 1 g postoperatively.  Stable. Recent Labs    09/08/23 2215 09/09/23 0754 10/21/23 0604 10/22/23 0352 10/23/23 0424 10/24/23 0516 10/25/23 1742 10/26/23 0511 10/27/23 0703 10/28/23 0838  HGB 11.1* 12.2 11.2* 10.2* 10.7* 11.1* 12.4 10.7* 8.8* 8.6*  -Check CBC in 1 week  Essential hypertension: Normotensive off antihypertensive meds. - Discontinue losartan    Advance care planning-recently hospitalized and discharged to nursing home with hospice.  She is DNR - Hospice/palliative follow-up at SNF   Leukocytosis: Likely demargination.  Improved without antibiotics - Recheck CBC in 1 week            Time spent 35 minutes  Vital signs Vitals:   10/27/23 1923 10/28/23 0037 10/28/23 0531 10/28/23 0723  BP: 126/68 126/72 (!) 129/58 (!) 150/73  Pulse: (!) 110 98 99   Temp: 99.3 F (37.4 C) 98 F (36.7 C) 98 F (36.7 C) 98.6 F (37 C)  Resp: 18 20 20  16  Height:      Weight:      SpO2: 94% 95% 99% 100%  TempSrc: Oral Oral Oral   BMI (Calculated):         Discharge exam GENERAL: No apparent distress.  Nontoxic. HEENT: MMM.  Vision grossly intact.  Significantly diminished hearing NECK: Supple.  No apparent JVD.  RESP:  No  IWOB.  Fair aeration bilaterally. CVS:  RRR. Heart sounds normal.  ABD/GI/GU: BS+. Abd soft, NTND.  MSK/EXT:  Moves extremities. No apparent deformity. No edema.  SKIN: no apparent skin lesion or wound.  Surgery call dressing DCI. NEURO: Sleepy but awakes to gentle tactile stimuli.  Oriented to self.  Follows some commands.  No apparent focal neuro deficit. PSYCH: Calm. Normal affect.    Discharge Instructions  Allergies as of 10/28/2023       Reactions   Adhesive [tape] Other (See Comments)   Tears skin   Doxycycline  Nausea And Vomiting   Latex Itching, Rash        Medication List     STOP taking these medications    aspirin  81 MG chewable tablet   clopidogrel  75 MG tablet Commonly known as: PLAVIX    hydrOXYzine  50 MG capsule Commonly known as: VISTARIL    losartan  25 MG tablet Commonly known as: Cozaar    potassium chloride  SA 20 MEQ tablet Commonly known as: KLOR-CON  M       TAKE these medications    acetaminophen  500 MG tablet Commonly known as: TYLENOL  Take 2 tablets (1,000 mg total) by mouth every 8 (eight) hours as needed for mild pain, moderate pain or headache.   albuterol  108 (90 Base) MCG/ACT inhaler Commonly known as: VENTOLIN  HFA Inhale 2 puffs into the lungs every 6 (six) hours as needed for wheezing or shortness of breath.   atorvastatin  40 MG tablet Commonly known as: LIPITOR  Take 40 mg by mouth at bedtime.   donepezil  10 MG tablet Commonly known as: ARICEPT  Take 10 mg by mouth at bedtime.   fluticasone  50 MCG/ACT nasal spray Commonly known as: FLONASE  Place 1 spray into both nostrils daily for 14 days.   HYDROcodone -acetaminophen  5-325 MG tablet Commonly known as: NORCO/VICODIN Take 0.5 tablets by mouth every 6 (six) hours as needed for moderate pain (pain score 4-6) or severe pain (pain score 7-10).   melatonin 3 MG Tabs tablet Take 3 mg by mouth at bedtime.   metoprolol  succinate 100 MG 24 hr tablet Commonly known as:  TOPROL -XL Take 100 mg by mouth daily.   QUEtiapine  25 MG tablet Commonly known as: SEROQUEL  Take 12.5 mg by mouth at bedtime.   venlafaxine  75 MG tablet Commonly known as: EFFEXOR  Take 75 mg by mouth 2 (two) times daily.        Consultations: Orthopedic surgery  Procedures/Studies: 5/4-right cephalomedullary nailing for right proximal femoral fracture    DG HIP PORT UNILAT W OR W/O PELVIS 1V RIGHT Result Date: 10/26/2023 CLINICAL DATA:  96051 Fracture 96051 EXAM: DG HIP (WITH OR WITHOUT PELVIS) 1V PORT RIGHT COMPARISON:  10/25/2023 FINDINGS: Interval sliding screw and IM rod fixation across right intertrochanteric femur fracture, with a single distal interlocking screw. Normal alignment. No dislocation. Scattered femoral arterial calcifications. IMPRESSION: Interval ORIF right intertrochanteric femur fracture. Electronically Signed   By: Nicoletta Barrier M.D.   On: 10/26/2023 11:48   DG HIP UNILAT WITH PELVIS 2-3 VIEWS RIGHT Result Date: 10/26/2023 CLINICAL DATA:  Fluoro guidance provided EXAM: DG HIP (WITH OR WITHOUT PELVIS) 2-3V RIGHT FINDINGS: Dose: Cumulative  Air Kerma 4.2mG y Fluoro time 42s IMPRESSION: C-arm fluoro guidance provided during right hip ORIF. Electronically Signed   By: Sydell Eva M.D.   On: 10/26/2023 09:36   DG C-Arm 1-60 Min-No Report Result Date: 10/26/2023 Fluoroscopy was utilized by the requesting physician.  No radiographic interpretation.   CT Head Wo Contrast Result Date: 10/25/2023 CLINICAL DATA:  Status post fall with head trauma EXAM: CT HEAD WITHOUT CONTRAST TECHNIQUE: Contiguous axial images were obtained from the base of the skull through the vertex without intravenous contrast. RADIATION DOSE REDUCTION: This exam was performed according to the departmental dose-optimization program which includes automated exposure control, adjustment of the mA and/or kV according to patient size and/or use of iterative reconstruction technique. COMPARISON:  CT head  10/21/2023 FINDINGS: Brain: No substantial change from 10/21/2023. Left-sided mixed density subdural hematoma with septations on the left. This measures up to 2.2 cm in thickness, unchanged using similar measuring technique. Small low-density right-sided subdural hematoma is also unchanged measuring up to 6 mm in radial dimension. 5 mm of rightward midline shift, unchanged. No new hemorrhage. No evidence of acute infarct. Stable ventricular caliber. Vascular: No hyperdense vessel or unexpected calcification. Skull: No acute fracture. Sinuses/Orbits: Chronic opacification of a few mastoid air cells bilaterally. Mucosal thickening in the right maxillary sinus. Paranasal sinuses are otherwise well aerated. Other: None. IMPRESSION: 1. No substantial change from 10/21/2023. 2. Left-sided mixed density subdural hematoma with septations measuring up to 2.2 cm in thickness. 3. Small low-density right-sided subdural hematoma measuring up to 6 mm in radial dimension. 4. 5 mm of rightward midline shift. Electronically Signed   By: Rozell Cornet M.D.   On: 10/25/2023 21:16   DG Hip Unilat With Pelvis 2-3 Views Right Result Date: 10/25/2023 CLINICAL DATA:  Trauma and hip shortening EXAM: DG HIP (WITH OR WITHOUT PELVIS) 2-3V RIGHT COMPARISON:  CT of 07/26/2023 FINDINGS: Vascular calcifications. Femoral heads are located. Osteopenia. Prior sacral plasty. Lower lumbar spondylosis. Artifact degraded attempted lateral views. Mildly comminuted right femoral neck fracture with varus angulation and impaction. Likely at the level of the base of the femoral neck versus intertrochanteric region. IMPRESSION: Impacted, varus angulated right proximal femur fracture. Electronically Signed   By: Lore Rode M.D.   On: 10/25/2023 18:15   DG Chest Port 1 View Result Date: 10/25/2023 CLINICAL DATA:  Shortness of breath.  Trauma. EXAM: PORTABLE CHEST 1 VIEW COMPARISON:  Chest CT dated 09/09/2023. FINDINGS: No focal consolidation, pleural  effusion or pneumothorax. The cardiac silhouette is within normal limits. Atherosclerotic calcification of the aortic arch. No acute osseous pathology. IMPRESSION: No active disease. Electronically Signed   By: Angus Bark M.D.   On: 10/25/2023 18:13   DG Chest Port 1 View Result Date: 10/21/2023 CLINICAL DATA:  Shortness of breath. EXAM: PORTABLE CHEST 1 VIEW COMPARISON:  09/08/2023 FINDINGS: Diffuse interstitial prominence again noted with a basilar predominance, right greater than left. No dense focal consolidative airspace disease or substantial pleural effusion. The cardiopericardial silhouette is within normal limits for size. No acute bony abnormality. Telemetry leads overlie the chest. IMPRESSION: Diffuse interstitial prominence with a basilar predominance, right greater than left. While likely chronic, imaging features could be related to edema or infection. Electronically Signed   By: Donnal Fusi M.D.   On: 10/21/2023 06:30   CT HEAD WO CONTRAST ( ) Result Date: 10/21/2023 CLINICAL DATA:  83 year old female found with altered mental status, shortness of breath. EXAM: CT HEAD WITHOUT CONTRAST TECHNIQUE: Contiguous axial images were obtained from  the base of the skull through the vertex without intravenous contrast. RADIATION DOSE REDUCTION: This exam was performed according to the departmental dose-optimization program which includes automated exposure control, adjustment of the mA and/or kV according to patient size and/or use of iterative reconstruction technique. COMPARISON:  Head CT 07/26/2023. FINDINGS: Brain: Left greater than right low-density subdural hematoma in February, left side subdural now septated, intermittently hyperdense, and larger measuring up to 2 cm in thickness (previously 14-15 mm (coronal image 28). Contralateral right side subdural remains hypodense and appears smaller, 4-5 mm (versus 8-9 mm previously). Intracranial mass effect with rightward midline shift not  significantly changed, 5-6 mm (series 2, image 18 today versus series 3, image 19 previously). Stable partially effaced left lateral ventricle. Basilar cisterns remain patent. No new areas of intracranial hemorrhage. Stable gray-white matter differentiation throughout the brain. No cortically based acute infarct identified. Widespread cerebral white matter hypodensity including deep white matter capsule involvement. Vascular: Generalized intracranial artery tortuosity. Calcified atherosclerosis at the skull base. No suspicious intracranial vascular hyperdensity. Skull: Stable and intact. Sinuses/Orbits: Improved left middle ear and mastoid aeration since February. Otherwise stable paranasal sinuses, right middle ear and mastoid aeration. Other: No acute orbit or scalp soft tissue finding. IMPRESSION: 1. Chronic Left subdural Hematoma with progressed loculation and hyperdense blood products since February, increased by up to 5 mm thickness (up to 20 mm now), although associated intracranial mass effect with 5-6 mm of rightward midline shift is stable. 2. Right side SDH remains low-density and is smaller, 4-5 mm now. 3. No other acute intracranial abnormality. Chronic white matter disease, intracranial artery tortuosity. Electronically Signed   By: Marlise Simpers M.D.   On: 10/21/2023 06:21       The results of significant diagnostics from this hospitalization (including imaging, microbiology, ancillary and laboratory) are listed below for reference.     Microbiology: Recent Results (from the past 240 hours)  Resp panel by RT-PCR (RSV, Flu A&B, Covid) Anterior Nasal Swab     Status: None   Collection Time: 10/21/23  6:19 AM   Specimen: Anterior Nasal Swab  Result Value Ref Range Status   SARS Coronavirus 2 by RT PCR NEGATIVE NEGATIVE Final    Comment: (NOTE) SARS-CoV-2 target nucleic acids are NOT DETECTED.  The SARS-CoV-2 RNA is generally detectable in upper respiratory specimens during the acute phase  of infection. The lowest concentration of SARS-CoV-2 viral copies this assay can detect is 138 copies/mL. A negative result does not preclude SARS-Cov-2 infection and should not be used as the sole basis for treatment or other patient management decisions. A negative result may occur with  improper specimen collection/handling, submission of specimen other than nasopharyngeal swab, presence of viral mutation(s) within the areas targeted by this assay, and inadequate number of viral copies(<138 copies/mL). A negative result must be combined with clinical observations, patient history, and epidemiological information. The expected result is Negative.  Fact Sheet for Patients:  BloggerCourse.com  Fact Sheet for Healthcare Providers:  SeriousBroker.it  This test is no t yet approved or cleared by the [place]  FDA and  has been authorized for detection and/or diagnosis of SARS-CoV-2 by FDA under an Emergency Use Authorization (EUA). This EUA will remain  in effect (meaning this test can be used) for the duration of the COVID-19 declaration under Section 564(b)(1) of the Act, 21 U.S.C.section 360bbb-3(b)(1), unless the authorization is terminated  or revoked sooner.       Influenza A by PCR NEGATIVE NEGATIVE Final  Influenza B by PCR NEGATIVE NEGATIVE Final    Comment: (NOTE) The Xpert Xpress SARS-CoV-2/FLU/RSV plus assay is intended as an aid in the diagnosis of influenza from Nasopharyngeal swab specimens and should not be used as a sole basis for treatment. Nasal washings and aspirates are unacceptable for Xpert Xpress SARS-CoV-2/FLU/RSV testing.  Fact Sheet for Patients: BloggerCourse.com  Fact Sheet for Healthcare Providers: SeriousBroker.it  This test is not yet approved or cleared by the [place]  FDA and has been authorized for detection and/or diagnosis of  SARS-CoV-2 by FDA under an Emergency Use Authorization (EUA). This EUA will remain in effect (meaning this test can be used) for the duration of the COVID-19 declaration under Section 564(b)(1) of the Act, 21 U.S.C. section 360bbb-3(b)(1), unless the authorization is terminated or revoked.     Resp Syncytial Virus by PCR NEGATIVE NEGATIVE Final    Comment: (NOTE) Fact Sheet for Patients: BloggerCourse.com  Fact Sheet for Healthcare Providers: SeriousBroker.it  This test is not yet approved or cleared by the [place]  FDA and has been authorized for detection and/or diagnosis of SARS-CoV-2 by FDA under an Emergency Use Authorization (EUA). This EUA will remain in effect (meaning this test can be used) for the duration of the COVID-19 declaration under Section 564(b)(1) of the Act, 21 U.S.C. section 360bbb-3(b)(1), unless the authorization is terminated or revoked.  Performed at [facility], [address], [place], [place] [address]   MRSA Next Gen by PCR, Nasal     Status: None   Collection Time: 10/21/23  2:15 PM   Specimen: Nasal Mucosa; Nasal Swab  Result Value Ref Range Status   MRSA by PCR Next Gen NOT DETECTED NOT DETECTED Final    Comment: (NOTE) The GeneXpert MRSA Assay (FDA approved for NASAL specimens only), is one component of a comprehensive MRSA colonization surveillance program. It is not intended to diagnose MRSA infection nor to guide or monitor treatment for MRSA infections. Test performance is not FDA approved in patients less than 16 years old. Performed at [facility], [address] [place], [place] [address]      Labs:  CBC: Recent Labs  Lab 10/24/23 [phone] 10/25/23 1742 10/26/23 0511 10/27/23 0703 10/28/23 0838  WBC 10.0 13.6* 10.6* 11.4* 10.7*  NEUTROABS  --  9.9*  --   --   --   HGB 11.1* 12.4 10.7* 8.8* 8.6*  HCT 36.1 39.1 33.7* 28.7* 27.6*  MCV 100.6* 100.0 99.1 102.5* 98.9  PLT 234  226 192 201 215   BMP &GFR Recent Labs  Lab 10/23/23 0424 10/24/23 0516 10/25/23 1742 10/26/23 0511 10/27/23 0703 10/28/23 0838  NA 135 140 137 138 141 136  K 4.4 4.4 4.9 4.5 4.5 4.1  CL 101 104 101 105 107 103  CO2 26 27 22 23  20* 25  GLUCOSE 99 94 110* 110* 107* 161*  BUN 30* 26* 28* 28* 41* 45*  CREATININE 1.50* 1.11* 1.36* 1.38* 1.51* 1.43*  CALCIUM  9.1 9.2 9.5 9.0 8.6* 8.6*  MG 2.1 2.1  --  2.1 2.4 2.4  PHOS  --   --   --  4.6 6.5* 3.5   Estimated Creatinine Clearance: 24.8 mL/min (A) (by C-G formula based on SCr of 1.43 mg/dL (H)). Liver & Pancreas: Recent Labs  Lab 10/27/23 0703 10/28/23 0838  ALBUMIN 2.9* 2.9*   No results for input(s): "LIPASE", "AMYLASE" in the last 168 hours. No results for input(s): "AMMONIA" in the last 168 hours. Diabetic: No results for input(s): "  HGBA1C" in the last 72 hours. No results for input(s): "GLUCAP" in the last 168 hours. Cardiac Enzymes: No results for input(s): "CKTOTAL", "CKMB", "CKMBINDEX", "TROPONINI" in the last 168 hours. No results for input(s): "PROBNP" in the last 8760 hours. Coagulation Profile: Recent Labs  Lab 10/25/23 1742  INR 1.0   Thyroid  Function Tests: No results for input(s): "TSH", "T4TOTAL", "FREET4", "T3FREE", "THYROIDAB" in the last 72 hours. Lipid Profile: No results for input(s): "CHOL", "HDL", "LDLCALC", "TRIG", "CHOLHDL", "LDLDIRECT" in the last 72 hours. Anemia Panel: No results for input(s): "VITAMINB12", "FOLATE", "FERRITIN", "TIBC", "IRON", "RETICCTPCT" in the last 72 hours. Urine analysis:    Component Value Date/Time   COLORURINE STRAW (A) 10/21/2023 0604   APPEARANCEUR CLEAR 10/21/2023 0604   LABSPEC 1.010 10/21/2023 0604   PHURINE 7.0 10/21/2023 0604   GLUCOSEU NEGATIVE 10/21/2023 0604   HGBUR NEGATIVE 10/21/2023 0604   BILIRUBINUR NEGATIVE 10/21/2023 0604   KETONESUR NEGATIVE 10/21/2023 0604   PROTEINUR 100 (A) 10/21/2023 0604   UROBILINOGEN 0.2 11/30/2014 1221   NITRITE  NEGATIVE 10/21/2023 0604   LEUKOCYTESUR NEGATIVE 10/21/2023 0604   Sepsis Labs: Invalid input(s): "PROCALCITONIN", "LACTICIDVEN"   SIGNED:  Theadore Finger, MD  [facility] 10/28/2023, 10:50 AM
